# Patient Record
Sex: Female | Born: 1950
Health system: Southern US, Community
[De-identification: ages and names within clinical notes are randomized; demographics above are authoritative.]

## PROBLEM LIST (undated history)

## (undated) DIAGNOSIS — F32A Depression, unspecified: Secondary | ICD-10-CM

## (undated) DIAGNOSIS — K219 Gastro-esophageal reflux disease without esophagitis: Secondary | ICD-10-CM

## (undated) DIAGNOSIS — F17200 Nicotine dependence, unspecified, uncomplicated: Secondary | ICD-10-CM

## (undated) DIAGNOSIS — S0502XA Injury of conjunctiva and corneal abrasion without foreign body, left eye, initial encounter: Secondary | ICD-10-CM

## (undated) DIAGNOSIS — T7840XA Allergy, unspecified, initial encounter: Secondary | ICD-10-CM

## (undated) DIAGNOSIS — Z8669 Personal history of other diseases of the nervous system and sense organs: Secondary | ICD-10-CM

## (undated) DIAGNOSIS — E785 Hyperlipidemia, unspecified: Secondary | ICD-10-CM

## (undated) DIAGNOSIS — M549 Dorsalgia, unspecified: Secondary | ICD-10-CM

## (undated) DIAGNOSIS — J449 Chronic obstructive pulmonary disease, unspecified: Secondary | ICD-10-CM

## (undated) DIAGNOSIS — I10 Essential (primary) hypertension: Secondary | ICD-10-CM

## (undated) DIAGNOSIS — D126 Benign neoplasm of colon, unspecified: Secondary | ICD-10-CM

## (undated) DIAGNOSIS — H409 Unspecified glaucoma: Secondary | ICD-10-CM

## (undated) DIAGNOSIS — F419 Anxiety disorder, unspecified: Secondary | ICD-10-CM

## (undated) DIAGNOSIS — F410 Panic disorder [episodic paroxysmal anxiety] without agoraphobia: Secondary | ICD-10-CM

## (undated) DIAGNOSIS — E119 Type 2 diabetes mellitus without complications: Secondary | ICD-10-CM

## (undated) DIAGNOSIS — J45909 Unspecified asthma, uncomplicated: Secondary | ICD-10-CM

## (undated) DIAGNOSIS — R109 Unspecified abdominal pain: Secondary | ICD-10-CM

## (undated) DIAGNOSIS — H269 Unspecified cataract: Secondary | ICD-10-CM

## (undated) DIAGNOSIS — F329 Major depressive disorder, single episode, unspecified: Secondary | ICD-10-CM

## (undated) HISTORY — DX: Type 2 diabetes mellitus without complications: E11.9

## (undated) HISTORY — DX: Chronic obstructive pulmonary disease, unspecified: J44.9

## (undated) HISTORY — DX: Injury of conjunctiva and corneal abrasion without foreign body, left eye, initial encounter: S05.02XA

## (undated) HISTORY — PX: COLONOSCOPY: SHX174

## (undated) HISTORY — DX: Unspecified asthma, uncomplicated: J45.909

## (undated) HISTORY — DX: Allergy, unspecified, initial encounter: T78.40XA

## (undated) HISTORY — DX: Gastro-esophageal reflux disease without esophagitis: K21.9

## (undated) HISTORY — DX: Major depressive disorder, single episode, unspecified: F32.9

## (undated) HISTORY — DX: Anxiety disorder, unspecified: F41.9

## (undated) HISTORY — PX: TUBAL LIGATION: SHX77

## (undated) HISTORY — PX: ABDOMINAL HYSTERECTOMY: SHX81

## (undated) HISTORY — PX: POLYPECTOMY: SHX149

## (undated) HISTORY — DX: Hyperlipidemia, unspecified: E78.5

## (undated) HISTORY — DX: Dorsalgia, unspecified: M54.9

## (undated) HISTORY — DX: Personal history of other diseases of the nervous system and sense organs: Z86.69

## (undated) HISTORY — DX: Benign neoplasm of colon, unspecified: D12.6

## (undated) HISTORY — DX: Nicotine dependence, unspecified, uncomplicated: F17.200

## (undated) HISTORY — DX: Unspecified cataract: H26.9

## (undated) HISTORY — DX: Essential (primary) hypertension: I10

## (undated) HISTORY — DX: Panic disorder (episodic paroxysmal anxiety): F41.0

## (undated) HISTORY — DX: Unspecified abdominal pain: R10.9

## (undated) HISTORY — DX: Depression, unspecified: F32.A

## (undated) HISTORY — DX: Unspecified glaucoma: H40.9

---
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
## 1980-10-27 HISTORY — PX: OTHER SURGICAL HISTORY: SHX169

## 1987-10-28 HISTORY — PX: TOTAL ABDOMINAL HYSTERECTOMY W/ BILATERAL SALPINGOOPHORECTOMY: SHX83

## 1990-10-27 HISTORY — PX: OTHER SURGICAL HISTORY: SHX169

## 1997-10-27 HISTORY — PX: CHOLECYSTECTOMY: SHX55

## 2001-02-06 ENCOUNTER — Emergency Department (HOSPITAL_COMMUNITY): Admission: EM | Admit: 2001-02-06 | Discharge: 2001-02-06 | Payer: Self-pay | Admitting: Internal Medicine

## 2001-02-06 ENCOUNTER — Encounter: Payer: Self-pay | Admitting: Internal Medicine

## 2001-03-29 ENCOUNTER — Emergency Department (HOSPITAL_COMMUNITY): Admission: EM | Admit: 2001-03-29 | Discharge: 2001-03-29 | Payer: Self-pay | Admitting: Emergency Medicine

## 2002-03-23 ENCOUNTER — Emergency Department (HOSPITAL_COMMUNITY): Admission: EM | Admit: 2002-03-23 | Discharge: 2002-03-23 | Payer: Self-pay | Admitting: Emergency Medicine

## 2002-04-18 ENCOUNTER — Emergency Department (HOSPITAL_COMMUNITY): Admission: EM | Admit: 2002-04-18 | Discharge: 2002-04-18 | Payer: Self-pay | Admitting: *Deleted

## 2002-12-19 ENCOUNTER — Emergency Department (HOSPITAL_COMMUNITY): Admission: EM | Admit: 2002-12-19 | Discharge: 2002-12-19 | Payer: Self-pay | Admitting: Emergency Medicine

## 2002-12-28 ENCOUNTER — Encounter: Payer: Self-pay | Admitting: Internal Medicine

## 2002-12-28 ENCOUNTER — Ambulatory Visit (HOSPITAL_COMMUNITY): Admission: RE | Admit: 2002-12-28 | Discharge: 2002-12-28 | Payer: Self-pay | Admitting: Internal Medicine

## 2003-03-12 ENCOUNTER — Emergency Department (HOSPITAL_COMMUNITY): Admission: EM | Admit: 2003-03-12 | Discharge: 2003-03-12 | Payer: Self-pay | Admitting: Internal Medicine

## 2003-07-24 ENCOUNTER — Encounter: Payer: Self-pay | Admitting: Emergency Medicine

## 2003-07-24 ENCOUNTER — Emergency Department (HOSPITAL_COMMUNITY): Admission: EM | Admit: 2003-07-24 | Discharge: 2003-07-24 | Payer: Self-pay | Admitting: Emergency Medicine

## 2004-06-06 ENCOUNTER — Emergency Department (HOSPITAL_COMMUNITY): Admission: EM | Admit: 2004-06-06 | Discharge: 2004-06-06 | Payer: Self-pay | Admitting: Emergency Medicine

## 2004-06-18 ENCOUNTER — Emergency Department (HOSPITAL_COMMUNITY): Admission: EM | Admit: 2004-06-18 | Discharge: 2004-06-18 | Payer: Self-pay | Admitting: Emergency Medicine

## 2004-09-18 ENCOUNTER — Ambulatory Visit (HOSPITAL_COMMUNITY): Admission: RE | Admit: 2004-09-18 | Discharge: 2004-09-18 | Payer: Self-pay | Admitting: Family Medicine

## 2004-09-22 ENCOUNTER — Emergency Department (HOSPITAL_COMMUNITY): Admission: EM | Admit: 2004-09-22 | Discharge: 2004-09-22 | Payer: Self-pay | Admitting: Emergency Medicine

## 2004-10-30 ENCOUNTER — Ambulatory Visit: Payer: Self-pay | Admitting: Family Medicine

## 2004-12-19 ENCOUNTER — Ambulatory Visit: Payer: Self-pay | Admitting: Family Medicine

## 2005-01-22 ENCOUNTER — Ambulatory Visit: Payer: Self-pay | Admitting: Family Medicine

## 2005-01-23 ENCOUNTER — Emergency Department (HOSPITAL_COMMUNITY): Admission: EM | Admit: 2005-01-23 | Discharge: 2005-01-23 | Payer: Self-pay | Admitting: Emergency Medicine

## 2005-02-13 ENCOUNTER — Ambulatory Visit: Payer: Self-pay | Admitting: Orthopedic Surgery

## 2005-04-09 ENCOUNTER — Ambulatory Visit: Payer: Self-pay | Admitting: Orthopedic Surgery

## 2005-05-07 ENCOUNTER — Ambulatory Visit (HOSPITAL_COMMUNITY): Admission: RE | Admit: 2005-05-07 | Discharge: 2005-05-07 | Payer: Self-pay | Admitting: General Surgery

## 2005-07-14 ENCOUNTER — Emergency Department (HOSPITAL_COMMUNITY): Admission: EM | Admit: 2005-07-14 | Discharge: 2005-07-14 | Payer: Self-pay | Admitting: Emergency Medicine

## 2005-07-15 ENCOUNTER — Ambulatory Visit: Payer: Self-pay | Admitting: Family Medicine

## 2005-09-16 ENCOUNTER — Ambulatory Visit: Payer: Self-pay | Admitting: Family Medicine

## 2005-10-22 ENCOUNTER — Ambulatory Visit: Payer: Self-pay | Admitting: Internal Medicine

## 2005-11-12 ENCOUNTER — Ambulatory Visit: Payer: Self-pay | Admitting: Family Medicine

## 2005-12-03 ENCOUNTER — Ambulatory Visit (HOSPITAL_COMMUNITY): Admission: RE | Admit: 2005-12-03 | Discharge: 2005-12-03 | Payer: Self-pay | Admitting: Internal Medicine

## 2005-12-03 ENCOUNTER — Ambulatory Visit: Payer: Self-pay | Admitting: Internal Medicine

## 2005-12-03 LAB — HM COLONOSCOPY: HM Colonoscopy: ABNORMAL

## 2005-12-04 ENCOUNTER — Encounter (INDEPENDENT_AMBULATORY_CARE_PROVIDER_SITE_OTHER): Payer: Self-pay | Admitting: Internal Medicine

## 2006-01-01 ENCOUNTER — Ambulatory Visit: Payer: Self-pay | Admitting: Family Medicine

## 2006-02-05 ENCOUNTER — Ambulatory Visit: Payer: Self-pay | Admitting: Internal Medicine

## 2006-03-18 ENCOUNTER — Inpatient Hospital Stay (HOSPITAL_COMMUNITY): Admission: EM | Admit: 2006-03-18 | Discharge: 2006-03-20 | Payer: Self-pay | Admitting: Emergency Medicine

## 2006-03-25 ENCOUNTER — Ambulatory Visit: Payer: Self-pay | Admitting: Family Medicine

## 2006-04-02 ENCOUNTER — Ambulatory Visit (HOSPITAL_COMMUNITY): Payer: Self-pay | Admitting: Psychiatry

## 2006-04-09 ENCOUNTER — Ambulatory Visit: Payer: Self-pay | Admitting: Family Medicine

## 2006-04-20 ENCOUNTER — Ambulatory Visit (HOSPITAL_COMMUNITY): Payer: Self-pay | Admitting: Psychiatry

## 2006-04-30 ENCOUNTER — Ambulatory Visit (HOSPITAL_COMMUNITY): Payer: Self-pay | Admitting: Psychiatry

## 2006-06-18 ENCOUNTER — Ambulatory Visit (HOSPITAL_COMMUNITY): Payer: Self-pay | Admitting: Psychiatry

## 2006-07-07 ENCOUNTER — Emergency Department (HOSPITAL_COMMUNITY): Admission: EM | Admit: 2006-07-07 | Discharge: 2006-07-07 | Payer: Self-pay | Admitting: Emergency Medicine

## 2006-07-15 ENCOUNTER — Ambulatory Visit: Payer: Self-pay | Admitting: Family Medicine

## 2006-07-16 ENCOUNTER — Ambulatory Visit (HOSPITAL_COMMUNITY): Payer: Self-pay | Admitting: Psychiatry

## 2006-07-28 ENCOUNTER — Ambulatory Visit (HOSPITAL_COMMUNITY): Payer: Self-pay | Admitting: Psychiatry

## 2006-09-02 ENCOUNTER — Ambulatory Visit: Payer: Self-pay | Admitting: Gastroenterology

## 2006-09-08 ENCOUNTER — Ambulatory Visit (HOSPITAL_COMMUNITY): Payer: Self-pay | Admitting: Psychiatry

## 2006-09-30 ENCOUNTER — Ambulatory Visit: Payer: Self-pay | Admitting: Family Medicine

## 2006-10-27 HISTORY — PX: OTHER SURGICAL HISTORY: SHX169

## 2006-10-29 ENCOUNTER — Ambulatory Visit: Payer: Self-pay | Admitting: Family Medicine

## 2006-12-08 ENCOUNTER — Ambulatory Visit (HOSPITAL_COMMUNITY): Payer: Self-pay | Admitting: Psychiatry

## 2006-12-09 ENCOUNTER — Ambulatory Visit (HOSPITAL_COMMUNITY): Payer: Self-pay | Admitting: Psychiatry

## 2006-12-22 ENCOUNTER — Ambulatory Visit (HOSPITAL_COMMUNITY): Admission: RE | Admit: 2006-12-22 | Discharge: 2006-12-22 | Payer: Self-pay | Admitting: Radiation Oncology

## 2007-02-04 ENCOUNTER — Ambulatory Visit (HOSPITAL_COMMUNITY): Payer: Self-pay | Admitting: Psychiatry

## 2007-04-01 ENCOUNTER — Ambulatory Visit: Payer: Self-pay | Admitting: Internal Medicine

## 2007-04-13 ENCOUNTER — Ambulatory Visit (HOSPITAL_COMMUNITY): Payer: Self-pay | Admitting: Psychiatry

## 2007-04-21 ENCOUNTER — Ambulatory Visit (HOSPITAL_COMMUNITY): Payer: Self-pay | Admitting: Psychiatry

## 2007-04-21 ENCOUNTER — Ambulatory Visit (HOSPITAL_COMMUNITY): Admission: RE | Admit: 2007-04-21 | Discharge: 2007-04-21 | Payer: Self-pay | Admitting: Internal Medicine

## 2007-05-06 ENCOUNTER — Ambulatory Visit (HOSPITAL_COMMUNITY): Payer: Self-pay | Admitting: Psychiatry

## 2007-05-12 ENCOUNTER — Ambulatory Visit (HOSPITAL_COMMUNITY): Payer: Self-pay | Admitting: Psychiatry

## 2007-06-01 ENCOUNTER — Ambulatory Visit (HOSPITAL_BASED_OUTPATIENT_CLINIC_OR_DEPARTMENT_OTHER): Admission: RE | Admit: 2007-06-01 | Discharge: 2007-06-02 | Payer: Self-pay | Admitting: Orthopedic Surgery

## 2007-07-15 ENCOUNTER — Ambulatory Visit (HOSPITAL_COMMUNITY): Payer: Self-pay | Admitting: Psychiatry

## 2007-09-06 ENCOUNTER — Ambulatory Visit (HOSPITAL_COMMUNITY): Admission: RE | Admit: 2007-09-06 | Discharge: 2007-09-06 | Payer: Self-pay | Admitting: Family Medicine

## 2007-09-16 ENCOUNTER — Ambulatory Visit (HOSPITAL_COMMUNITY): Payer: Self-pay | Admitting: Psychiatry

## 2007-10-12 ENCOUNTER — Ambulatory Visit (HOSPITAL_COMMUNITY): Payer: Self-pay | Admitting: Psychiatry

## 2007-11-05 ENCOUNTER — Encounter: Payer: Self-pay | Admitting: Family Medicine

## 2007-11-05 DIAGNOSIS — E1159 Type 2 diabetes mellitus with other circulatory complications: Secondary | ICD-10-CM | POA: Insufficient documentation

## 2007-11-05 DIAGNOSIS — F410 Panic disorder [episodic paroxysmal anxiety] without agoraphobia: Secondary | ICD-10-CM

## 2007-11-05 DIAGNOSIS — I10 Essential (primary) hypertension: Secondary | ICD-10-CM | POA: Insufficient documentation

## 2007-11-05 DIAGNOSIS — K219 Gastro-esophageal reflux disease without esophagitis: Secondary | ICD-10-CM | POA: Insufficient documentation

## 2007-11-05 DIAGNOSIS — I152 Hypertension secondary to endocrine disorders: Secondary | ICD-10-CM | POA: Insufficient documentation

## 2007-11-05 DIAGNOSIS — F329 Major depressive disorder, single episode, unspecified: Secondary | ICD-10-CM | POA: Insufficient documentation

## 2007-11-05 DIAGNOSIS — Z87898 Personal history of other specified conditions: Secondary | ICD-10-CM | POA: Insufficient documentation

## 2007-11-05 HISTORY — DX: Panic disorder (episodic paroxysmal anxiety): F41.0

## 2008-02-01 ENCOUNTER — Ambulatory Visit (HOSPITAL_COMMUNITY): Payer: Self-pay | Admitting: Psychiatry

## 2008-05-02 ENCOUNTER — Ambulatory Visit (HOSPITAL_COMMUNITY): Payer: Self-pay | Admitting: Psychiatry

## 2008-08-17 ENCOUNTER — Ambulatory Visit: Payer: Self-pay | Admitting: Family Medicine

## 2008-08-17 DIAGNOSIS — R5381 Other malaise: Secondary | ICD-10-CM | POA: Insufficient documentation

## 2008-08-17 DIAGNOSIS — R5383 Other fatigue: Secondary | ICD-10-CM

## 2008-08-17 DIAGNOSIS — IMO0002 Reserved for concepts with insufficient information to code with codable children: Secondary | ICD-10-CM | POA: Insufficient documentation

## 2008-08-18 ENCOUNTER — Encounter: Payer: Self-pay | Admitting: Family Medicine

## 2008-08-18 DIAGNOSIS — Z87891 Personal history of nicotine dependence: Secondary | ICD-10-CM | POA: Insufficient documentation

## 2008-08-18 DIAGNOSIS — N39498 Other specified urinary incontinence: Secondary | ICD-10-CM | POA: Insufficient documentation

## 2008-08-18 DIAGNOSIS — F17200 Nicotine dependence, unspecified, uncomplicated: Secondary | ICD-10-CM

## 2008-08-18 HISTORY — DX: Personal history of nicotine dependence: Z87.891

## 2008-08-21 ENCOUNTER — Ambulatory Visit: Payer: Self-pay | Admitting: Family Medicine

## 2008-08-21 ENCOUNTER — Telehealth: Payer: Self-pay | Admitting: Family Medicine

## 2008-08-24 ENCOUNTER — Ambulatory Visit (HOSPITAL_COMMUNITY): Admission: RE | Admit: 2008-08-24 | Discharge: 2008-08-24 | Payer: Self-pay | Admitting: Family Medicine

## 2008-08-24 ENCOUNTER — Ambulatory Visit (HOSPITAL_COMMUNITY): Payer: Self-pay | Admitting: Psychiatry

## 2008-09-06 ENCOUNTER — Ambulatory Visit (HOSPITAL_COMMUNITY): Admission: RE | Admit: 2008-09-06 | Discharge: 2008-09-06 | Payer: Self-pay | Admitting: Family Medicine

## 2008-09-13 ENCOUNTER — Ambulatory Visit (HOSPITAL_COMMUNITY): Payer: Self-pay | Admitting: Psychiatry

## 2008-09-25 ENCOUNTER — Encounter: Payer: Self-pay | Admitting: Family Medicine

## 2008-09-26 LAB — CONVERTED CEMR LAB
ALT: 33 units/L (ref 0–35)
AST: 27 units/L (ref 0–37)
Albumin: 4.6 g/dL (ref 3.5–5.2)
Alkaline Phosphatase: 86 units/L (ref 39–117)
BUN: 18 mg/dL (ref 6–23)
Basophils Absolute: 0 10*3/uL (ref 0.0–0.1)
Basophils Relative: 1 % (ref 0–1)
Bilirubin, Direct: 0.1 mg/dL (ref 0.0–0.3)
CO2: 25 meq/L (ref 19–32)
Calcium: 9.3 mg/dL (ref 8.4–10.5)
Chloride: 102 meq/L (ref 96–112)
Cholesterol: 236 mg/dL — ABNORMAL HIGH (ref 0–200)
Creatinine, Ser: 0.94 mg/dL (ref 0.40–1.20)
Eosinophils Absolute: 0.3 10*3/uL (ref 0.0–0.7)
Eosinophils Relative: 4 % (ref 0–5)
Glucose, Bld: 74 mg/dL (ref 70–99)
HCT: 43.7 % (ref 36.0–46.0)
HDL: 50 mg/dL (ref >39)
Hemoglobin: 14.2 g/dL (ref 12.0–15.0)
Indirect Bilirubin: 0.3 mg/dL (ref 0.0–0.9)
LDL Cholesterol: 129 mg/dL — ABNORMAL HIGH (ref 0–99)
Lymphocytes Relative: 51 % — ABNORMAL HIGH (ref 12–46)
Lymphs Abs: 3.3 10*3/uL (ref 0.7–4.0)
MCHC: 32.5 g/dL (ref 30.0–36.0)
MCV: 86.4 fL (ref 78.0–100.0)
Monocytes Absolute: 0.5 10*3/uL (ref 0.1–1.0)
Monocytes Relative: 7 % (ref 3–12)
Neutro Abs: 2.4 10*3/uL (ref 1.7–7.7)
Neutrophils Relative %: 37 % — ABNORMAL LOW (ref 43–77)
Platelets: 391 10*3/uL (ref 150–400)
Potassium: 3.4 meq/L — ABNORMAL LOW (ref 3.5–5.3)
RBC: 5.06 M/uL (ref 3.87–5.11)
RDW: 14.2 % (ref 11.5–15.5)
Sodium: 142 meq/L (ref 135–145)
TSH: 1.194 microintl units/mL (ref 0.350–4.50)
Total Bilirubin: 0.4 mg/dL (ref 0.3–1.2)
Total CHOL/HDL Ratio: 4.7
Total Protein: 7.4 g/dL (ref 6.0–8.3)
Triglycerides: 286 mg/dL — ABNORMAL HIGH (ref <150)
VLDL: 57 mg/dL — ABNORMAL HIGH (ref 0–40)
WBC: 6.5 10*3/uL (ref 4.0–10.5)

## 2008-09-28 ENCOUNTER — Ambulatory Visit: Payer: Self-pay | Admitting: Family Medicine

## 2008-10-03 ENCOUNTER — Ambulatory Visit: Payer: Self-pay | Admitting: Family Medicine

## 2008-11-09 ENCOUNTER — Ambulatory Visit: Payer: Self-pay | Admitting: Family Medicine

## 2008-11-09 DIAGNOSIS — R110 Nausea: Secondary | ICD-10-CM | POA: Insufficient documentation

## 2008-11-16 ENCOUNTER — Telehealth: Payer: Self-pay | Admitting: Family Medicine

## 2008-11-16 ENCOUNTER — Ambulatory Visit: Payer: Self-pay | Admitting: Family Medicine

## 2008-11-23 ENCOUNTER — Ambulatory Visit (HOSPITAL_COMMUNITY): Payer: Self-pay | Admitting: Psychiatry

## 2008-12-28 ENCOUNTER — Ambulatory Visit: Payer: Self-pay | Admitting: Family Medicine

## 2009-01-01 ENCOUNTER — Telehealth: Payer: Self-pay | Admitting: Family Medicine

## 2009-01-19 ENCOUNTER — Encounter: Payer: Self-pay | Admitting: Family Medicine

## 2009-02-15 ENCOUNTER — Encounter: Payer: Self-pay | Admitting: Family Medicine

## 2009-02-15 ENCOUNTER — Telehealth: Payer: Self-pay | Admitting: Family Medicine

## 2009-02-19 ENCOUNTER — Telehealth: Payer: Self-pay | Admitting: Family Medicine

## 2009-02-20 ENCOUNTER — Ambulatory Visit: Payer: Self-pay | Admitting: Family Medicine

## 2009-02-20 LAB — CONVERTED CEMR LAB
ALT: 11 units/L (ref 0–35)
AST: 16 units/L (ref 0–37)
Albumin: 4.2 g/dL (ref 3.5–5.2)
Alkaline Phosphatase: 107 units/L (ref 39–117)
Bilirubin, Direct: 0.1 mg/dL (ref 0.0–0.3)
Cholesterol: 169 mg/dL (ref 0–200)
HDL: 36 mg/dL — ABNORMAL LOW (ref >39)
Indirect Bilirubin: 0.5 mg/dL (ref 0.0–0.9)
LDL Cholesterol: 103 mg/dL — ABNORMAL HIGH (ref 0–99)
Total Bilirubin: 0.6 mg/dL (ref 0.3–1.2)
Total CHOL/HDL Ratio: 4.7
Total Protein: 7 g/dL (ref 6.0–8.3)
Triglycerides: 151 mg/dL — ABNORMAL HIGH (ref <150)
VLDL: 30 mg/dL (ref 0–40)

## 2009-02-23 ENCOUNTER — Encounter: Admission: RE | Admit: 2009-02-23 | Discharge: 2009-02-23 | Payer: Self-pay | Admitting: Family Medicine

## 2009-02-26 ENCOUNTER — Telehealth: Payer: Self-pay | Admitting: Family Medicine

## 2009-02-28 ENCOUNTER — Telehealth: Payer: Self-pay | Admitting: Family Medicine

## 2009-03-01 ENCOUNTER — Encounter: Payer: Self-pay | Admitting: Family Medicine

## 2009-03-05 ENCOUNTER — Encounter: Payer: Self-pay | Admitting: Family Medicine

## 2009-03-07 ENCOUNTER — Encounter: Payer: Self-pay | Admitting: Family Medicine

## 2009-03-08 ENCOUNTER — Ambulatory Visit (HOSPITAL_COMMUNITY): Payer: Self-pay | Admitting: Psychiatry

## 2009-03-22 ENCOUNTER — Encounter (INDEPENDENT_AMBULATORY_CARE_PROVIDER_SITE_OTHER): Payer: Self-pay

## 2009-03-22 ENCOUNTER — Ambulatory Visit: Payer: Self-pay | Admitting: Family Medicine

## 2009-03-23 ENCOUNTER — Ambulatory Visit (HOSPITAL_COMMUNITY): Payer: Self-pay | Admitting: Psychiatry

## 2009-04-27 ENCOUNTER — Encounter: Payer: Self-pay | Admitting: Family Medicine

## 2009-05-04 ENCOUNTER — Telehealth: Payer: Self-pay | Admitting: Family Medicine

## 2009-05-07 ENCOUNTER — Emergency Department (HOSPITAL_COMMUNITY): Admission: EM | Admit: 2009-05-07 | Discharge: 2009-05-07 | Payer: Self-pay | Admitting: Emergency Medicine

## 2009-05-08 ENCOUNTER — Encounter: Payer: Self-pay | Admitting: Family Medicine

## 2009-05-16 ENCOUNTER — Encounter: Payer: Self-pay | Admitting: Family Medicine

## 2009-06-18 ENCOUNTER — Encounter: Payer: Self-pay | Admitting: Family Medicine

## 2009-07-12 ENCOUNTER — Encounter: Payer: Self-pay | Admitting: Family Medicine

## 2009-08-02 ENCOUNTER — Ambulatory Visit (HOSPITAL_COMMUNITY): Payer: Self-pay | Admitting: Psychiatry

## 2009-08-03 ENCOUNTER — Ambulatory Visit (HOSPITAL_COMMUNITY): Payer: Self-pay | Admitting: Psychiatry

## 2009-08-06 ENCOUNTER — Encounter: Payer: Self-pay | Admitting: Family Medicine

## 2009-08-08 ENCOUNTER — Ambulatory Visit: Payer: Self-pay | Admitting: Family Medicine

## 2009-08-09 ENCOUNTER — Ambulatory Visit (HOSPITAL_COMMUNITY): Payer: Self-pay | Admitting: Psychiatry

## 2009-08-09 ENCOUNTER — Telehealth: Payer: Self-pay | Admitting: Family Medicine

## 2009-08-16 ENCOUNTER — Ambulatory Visit (HOSPITAL_COMMUNITY): Payer: Self-pay | Admitting: Psychiatry

## 2009-08-20 ENCOUNTER — Encounter: Payer: Self-pay | Admitting: Family Medicine

## 2009-08-29 ENCOUNTER — Encounter: Payer: Self-pay | Admitting: Family Medicine

## 2009-10-03 ENCOUNTER — Encounter: Payer: Self-pay | Admitting: Family Medicine

## 2009-10-17 ENCOUNTER — Ambulatory Visit: Payer: Self-pay | Admitting: Family Medicine

## 2009-10-17 DIAGNOSIS — N3000 Acute cystitis without hematuria: Secondary | ICD-10-CM | POA: Insufficient documentation

## 2009-10-17 LAB — CONVERTED CEMR LAB
Blood in Urine, dipstick: NEGATIVE
Glucose, Urine, Semiquant: NEGATIVE
Nitrite: NEGATIVE
Protein, U semiquant: 30
Specific Gravity, Urine: 1.025
Urobilinogen, UA: 1
WBC Urine, dipstick: NEGATIVE
pH: 5.5

## 2009-10-18 ENCOUNTER — Telehealth: Payer: Self-pay | Admitting: Family Medicine

## 2009-10-29 ENCOUNTER — Encounter: Payer: Self-pay | Admitting: Family Medicine

## 2009-10-30 ENCOUNTER — Encounter: Payer: Self-pay | Admitting: Family Medicine

## 2009-11-05 ENCOUNTER — Ambulatory Visit: Payer: Self-pay | Admitting: Family Medicine

## 2009-11-05 DIAGNOSIS — R7301 Impaired fasting glucose: Secondary | ICD-10-CM | POA: Insufficient documentation

## 2009-11-05 DIAGNOSIS — B356 Tinea cruris: Secondary | ICD-10-CM | POA: Insufficient documentation

## 2009-11-05 LAB — CONVERTED CEMR LAB
Bilirubin Urine: NEGATIVE
Blood Glucose, Fasting: 122 mg/dL
Blood in Urine, dipstick: NEGATIVE
Glucose, Urine, Semiquant: NEGATIVE
Hgb A1c MFr Bld: 5.6 %
Ketones, urine, test strip: NEGATIVE
Nitrite: POSITIVE
Protein, U semiquant: NEGATIVE
Specific Gravity, Urine: 1.01
Urobilinogen, UA: 0.2
WBC Urine, dipstick: NEGATIVE
pH: 6

## 2009-11-06 ENCOUNTER — Encounter: Payer: Self-pay | Admitting: Family Medicine

## 2009-11-08 ENCOUNTER — Encounter: Payer: Self-pay | Admitting: Family Medicine

## 2009-11-20 ENCOUNTER — Telehealth: Payer: Self-pay | Admitting: Family Medicine

## 2009-11-20 ENCOUNTER — Encounter: Payer: Self-pay | Admitting: Family Medicine

## 2009-12-17 ENCOUNTER — Encounter: Payer: Self-pay | Admitting: Family Medicine

## 2009-12-17 ENCOUNTER — Ambulatory Visit (HOSPITAL_COMMUNITY): Payer: Self-pay | Admitting: Psychiatry

## 2009-12-18 ENCOUNTER — Telehealth (INDEPENDENT_AMBULATORY_CARE_PROVIDER_SITE_OTHER): Payer: Self-pay | Admitting: *Deleted

## 2009-12-19 ENCOUNTER — Telehealth: Payer: Self-pay | Admitting: Family Medicine

## 2009-12-19 ENCOUNTER — Encounter: Payer: Self-pay | Admitting: Family Medicine

## 2009-12-21 ENCOUNTER — Ambulatory Visit: Payer: Self-pay | Admitting: Family Medicine

## 2010-01-03 ENCOUNTER — Ambulatory Visit (HOSPITAL_COMMUNITY): Payer: Self-pay | Admitting: Psychiatry

## 2010-02-11 ENCOUNTER — Ambulatory Visit: Payer: Self-pay | Admitting: Family Medicine

## 2010-02-12 ENCOUNTER — Telehealth: Payer: Self-pay | Admitting: Family Medicine

## 2010-02-27 ENCOUNTER — Encounter: Payer: Self-pay | Admitting: Family Medicine

## 2010-04-01 ENCOUNTER — Ambulatory Visit (HOSPITAL_COMMUNITY): Payer: Self-pay | Admitting: Psychiatry

## 2010-04-02 ENCOUNTER — Encounter: Payer: Self-pay | Admitting: Family Medicine

## 2010-04-15 ENCOUNTER — Ambulatory Visit (HOSPITAL_COMMUNITY): Payer: Self-pay | Admitting: Psychiatry

## 2010-04-18 ENCOUNTER — Ambulatory Visit (HOSPITAL_COMMUNITY): Payer: Self-pay | Admitting: Psychiatry

## 2010-04-26 ENCOUNTER — Encounter: Payer: Self-pay | Admitting: Family Medicine

## 2010-05-22 ENCOUNTER — Ambulatory Visit (HOSPITAL_COMMUNITY): Payer: Self-pay | Admitting: Psychiatry

## 2010-06-02 ENCOUNTER — Inpatient Hospital Stay (HOSPITAL_COMMUNITY): Admission: EM | Admit: 2010-06-02 | Discharge: 2010-06-07 | Payer: Self-pay | Admitting: Emergency Medicine

## 2010-06-04 ENCOUNTER — Encounter (INDEPENDENT_AMBULATORY_CARE_PROVIDER_SITE_OTHER): Payer: Self-pay

## 2010-06-04 HISTORY — PX: SIMPLE MASTECTOMY: SHX2409

## 2010-06-18 ENCOUNTER — Ambulatory Visit: Payer: Self-pay | Admitting: Family Medicine

## 2010-06-28 ENCOUNTER — Ambulatory Visit (HOSPITAL_COMMUNITY): Payer: Self-pay | Admitting: Psychiatry

## 2010-07-11 ENCOUNTER — Ambulatory Visit (HOSPITAL_COMMUNITY): Admission: RE | Admit: 2010-07-11 | Discharge: 2010-07-11 | Payer: Self-pay | Admitting: Family Medicine

## 2010-07-11 ENCOUNTER — Ambulatory Visit: Payer: Self-pay | Admitting: Family Medicine

## 2010-07-11 ENCOUNTER — Ambulatory Visit (HOSPITAL_COMMUNITY): Payer: Self-pay | Admitting: Psychiatry

## 2010-07-15 ENCOUNTER — Encounter: Payer: Self-pay | Admitting: Family Medicine

## 2010-07-18 LAB — CONVERTED CEMR LAB
ALT: 18 units/L (ref 0–35)
BUN: 8 mg/dL (ref 6–23)
Bilirubin, Direct: 0.1 mg/dL (ref 0.0–0.3)
CO2: 22 meq/L (ref 19–32)
Cholesterol: 225 mg/dL — ABNORMAL HIGH (ref 0–200)
Eosinophils Absolute: 0.2 10*3/uL (ref 0.0–0.7)
Eosinophils Relative: 3 % (ref 0–5)
Glucose, Bld: 98 mg/dL (ref 70–99)
HCT: 43.3 % (ref 36.0–46.0)
Indirect Bilirubin: 0.5 mg/dL (ref 0.0–0.9)
Lymphocytes Relative: 57 % — ABNORMAL HIGH (ref 12–46)
Lymphs Abs: 3.2 10*3/uL (ref 0.7–4.0)
MCV: 88.7 fL (ref 78.0–100.0)
Monocytes Relative: 6 % (ref 3–12)
Potassium: 4.4 meq/L (ref 3.5–5.3)
RBC: 4.88 M/uL (ref 3.87–5.11)
Total Bilirubin: 0.6 mg/dL (ref 0.3–1.2)
VLDL: 53 mg/dL — ABNORMAL HIGH (ref 0–40)
WBC: 5.7 10*3/uL (ref 4.0–10.5)

## 2010-08-09 ENCOUNTER — Encounter: Payer: Self-pay | Admitting: Family Medicine

## 2010-08-13 ENCOUNTER — Ambulatory Visit: Payer: Self-pay | Admitting: Family Medicine

## 2010-08-13 DIAGNOSIS — R109 Unspecified abdominal pain: Secondary | ICD-10-CM | POA: Insufficient documentation

## 2010-08-13 DIAGNOSIS — R141 Gas pain: Secondary | ICD-10-CM | POA: Insufficient documentation

## 2010-08-13 DIAGNOSIS — R142 Eructation: Secondary | ICD-10-CM

## 2010-08-13 DIAGNOSIS — R143 Flatulence: Secondary | ICD-10-CM

## 2010-08-13 HISTORY — DX: Unspecified abdominal pain: R10.9

## 2010-08-13 LAB — CONVERTED CEMR LAB
Bilirubin Urine: NEGATIVE
Blood in Urine, dipstick: NEGATIVE
Glucose, Urine, Semiquant: NEGATIVE
Ketones, urine, test strip: NEGATIVE
Protein, U semiquant: NEGATIVE
Urobilinogen, UA: 0.2
pH: 5.5

## 2010-08-21 ENCOUNTER — Encounter: Payer: Self-pay | Admitting: Family Medicine

## 2010-08-23 ENCOUNTER — Telehealth: Payer: Self-pay | Admitting: Family Medicine

## 2010-08-23 ENCOUNTER — Ambulatory Visit (HOSPITAL_COMMUNITY)
Admission: RE | Admit: 2010-08-23 | Discharge: 2010-08-23 | Payer: Self-pay | Source: Home / Self Care | Admitting: Family Medicine

## 2010-10-08 ENCOUNTER — Ambulatory Visit (HOSPITAL_COMMUNITY): Payer: Self-pay | Admitting: Psychiatry

## 2010-10-16 ENCOUNTER — Ambulatory Visit: Payer: Self-pay | Admitting: Family Medicine

## 2010-10-22 ENCOUNTER — Ambulatory Visit: Payer: Self-pay | Admitting: Family Medicine

## 2010-10-23 ENCOUNTER — Telehealth: Payer: Self-pay | Admitting: Family Medicine

## 2010-10-23 ENCOUNTER — Ambulatory Visit (HOSPITAL_COMMUNITY): Payer: Self-pay | Admitting: Psychiatry

## 2010-10-27 DIAGNOSIS — G5603 Carpal tunnel syndrome, bilateral upper limbs: Secondary | ICD-10-CM

## 2010-10-27 DIAGNOSIS — L03119 Cellulitis of unspecified part of limb: Secondary | ICD-10-CM

## 2010-10-27 DIAGNOSIS — L02519 Cutaneous abscess of unspecified hand: Secondary | ICD-10-CM | POA: Insufficient documentation

## 2010-10-27 HISTORY — DX: Carpal tunnel syndrome, bilateral upper limbs: G56.03

## 2010-10-31 ENCOUNTER — Telehealth: Payer: Self-pay | Admitting: Family Medicine

## 2010-10-31 ENCOUNTER — Emergency Department (HOSPITAL_COMMUNITY)
Admission: EM | Admit: 2010-10-31 | Discharge: 2010-10-31 | Payer: Self-pay | Source: Home / Self Care | Admitting: Emergency Medicine

## 2010-11-04 ENCOUNTER — Ambulatory Visit (HOSPITAL_COMMUNITY): Admission: RE | Admit: 2010-11-04 | Payer: Self-pay | Source: Home / Self Care | Admitting: Family Medicine

## 2010-11-04 ENCOUNTER — Telehealth: Payer: Self-pay | Admitting: Family Medicine

## 2010-11-05 ENCOUNTER — Encounter: Payer: Self-pay | Admitting: Family Medicine

## 2010-11-11 LAB — GLUCOSE, CAPILLARY: Glucose-Capillary: 129 mg/dL — ABNORMAL HIGH (ref 70–99)

## 2010-11-13 ENCOUNTER — Emergency Department (HOSPITAL_COMMUNITY)
Admission: EM | Admit: 2010-11-13 | Discharge: 2010-11-13 | Payer: Self-pay | Source: Home / Self Care | Admitting: Emergency Medicine

## 2010-11-13 ENCOUNTER — Ambulatory Visit (HOSPITAL_COMMUNITY)
Admission: RE | Admit: 2010-11-13 | Discharge: 2010-11-13 | Payer: Self-pay | Source: Home / Self Care | Attending: Psychiatry | Admitting: Psychiatry

## 2010-11-13 ENCOUNTER — Encounter: Payer: Self-pay | Admitting: Family Medicine

## 2010-11-16 ENCOUNTER — Encounter: Payer: Self-pay | Admitting: Family Medicine

## 2010-11-17 ENCOUNTER — Encounter: Payer: Self-pay | Admitting: Family Medicine

## 2010-11-17 ENCOUNTER — Encounter: Payer: Self-pay | Admitting: General Surgery

## 2010-11-19 ENCOUNTER — Telehealth (INDEPENDENT_AMBULATORY_CARE_PROVIDER_SITE_OTHER): Payer: Self-pay | Admitting: *Deleted

## 2010-11-21 ENCOUNTER — Encounter: Payer: Self-pay | Admitting: Family Medicine

## 2010-11-26 NOTE — Letter (Signed)
Summary: RX ASSISTANCE  RX ASSISTANCE   Imported By: Lind Guest 08/09/2010 10:25:31  _____________________________________________________________________  External Attachment:    Type:   Image     Comment:   External Document

## 2010-11-26 NOTE — Progress Notes (Signed)
  Phone Note Call from Patient   Summary of Call: Patient called in and said that the Vicodin that you gave her is not working and she needs something else.  Initial call taken by: Everitt Amber,  November 20, 2009 1:44 PM  Follow-up for Phone Call        Patient having bilateral breast pain and she wants something other than vicodin because its not working Follow-up by: Everitt Amber,  November 20, 2009 3:31 PM  Additional Follow-up for Phone Call Additional follow up Details #1::        PLSask when she is next seeing the docs at [facility], if she is having such seveere pain in the breasts they need to know in case anything else is going on.  PLS inc the dose to twice daily, send in new script with stamped change noted to pharmacy, notify [py also, BUT above this dose I am not going,for this pain, pls let her know Additional Follow-up by: Syliva Overman MD,  November 21, 2009 5:27 AM    Additional Follow-up for Phone Call Additional follow up Details #2::    called patient, left message Follow-up by: Worthy Keeler LPN,  November 21, 2009 10:08 AM  Additional Follow-up for Phone Call Additional follow up Details #3:: Details for Additional Follow-up Action Taken: WHY IS THIS BACK IN MY BOX? Additional Follow-up by: Syliva Overman MD,  November 21, 2009 12:17 PM  New/Updated Medications: VICODIN HP 10-660 MG TABS (HYDROCODONE-ACETAMINOPHEN) Take 1 tab by mouth two times a day Prescriptions: VICODIN HP 10-660 MG TABS (HYDROCODONE-ACETAMINOPHEN) Take 1 tab by mouth two times a day  #60 x 2   Entered by:   Worthy Keeler LPN   Authorized by:   Syliva Overman MD   Signed by:   Worthy Keeler LPN on 04/54/0981   Method used:   Printed then faxed to ...       [facility]  [address] [address] [facility] (retail)       [address]       [place], [place]  [address]       Ph: [phone]       Fax: [phone]   RxID:   [id number]  med sent and patient aware

## 2010-11-26 NOTE — Letter (Signed)
Summary: RX ASSISTANCE  RX ASSISTANCE   Imported By: Lind Guest 11/08/2009 08:30:31  _____________________________________________________________________  External Attachment:    Type:   Image     Comment:   External Document

## 2010-11-26 NOTE — Miscellaneous (Signed)
Summary: refill  Clinical Lists Changes  Medications: Rx of ALPRAZOLAM 0.5 MG TABS (ALPRAZOLAM) Take 1 tablet by mouth three times a day;  #90 x 1;  Signed;  Entered by: Everitt Amber;  Authorized by: Syliva Overman MD;  Method used: Printed then faxed to [facility] [address], [place], [place], [place]  [address], Ph: [phone], Fax: [phone]    Prescriptions: ALPRAZOLAM 0.5 MG TABS (ALPRAZOLAM) Take 1 tablet by mouth three times a day  #90 x 1   Entered by:   Everitt Amber   Authorized by:   Syliva Overman MD   Signed by:   Everitt Amber on 10/29/2009   Method used:   Printed then faxed to ...       [facility] [address] [facility] (retail)       [address]       [place], [place]  [address]       Ph: [phone]       Fax: [phone]   RxID:   [id number]

## 2010-11-26 NOTE — Progress Notes (Signed)
  Phone Note Call from Patient   Caller: Patient Summary of Call: had radiology exam this morning, could not do because of anxiety, needs med so she can go back this evening  Initial call taken by: Adella Hare LPN,  August 23, 2010 11:17 AM  Follow-up for Phone Call        med prescribed pls fax andlet pt know Follow-up by: Syliva Overman MD,  August 23, 2010 11:56 AM  Additional Follow-up for Phone Call Additional follow up Details #1::        med faxed, patient aware Additional Follow-up by: Adella Hare LPN,  August 23, 2010 2:08 PM    New/Updated Medications: DIAZEPAM 5 MG TABS (DIAZEPAM) one tablet half hour before procedure , repeat after 15 mins if needed, pt needs a driver Prescriptions: DIAZEPAM 5 MG TABS (DIAZEPAM) one tablet half hour before procedure , repeat after 15 mins if needed, pt needs a driver  #2 x 0   Entered and Authorized by:   Syliva Overman MD   Signed by:   Syliva Overman MD on 08/23/2010   Method used:   Printed then faxed to ...       [facility]  [address] [facility] [address] [facility] (retail)       [address]       [place], [place]  [address]       Ph: [phone]       Fax: [phone]   RxID:   [id number]

## 2010-11-26 NOTE — Miscellaneous (Signed)
Summary: Home Care Report  Home Care Report   Imported By: Lind Guest 06/18/2010 13:43:43  _____________________________________________________________________  External Attachment:    Type:   Image     Comment:   External Document

## 2010-11-26 NOTE — Progress Notes (Signed)
[facility]: [facility]   Imported By: Lind Guest 11/27/2009 13:24:41  _____________________________________________________________________  External Attachment:    Type:   Image     Comment:   External Document

## 2010-11-26 NOTE — Letter (Signed)
Summary: rs assistance  rs assistance   Imported By: Lind Guest 08/21/2010 12:47:04  _____________________________________________________________________  External Attachment:    Type:   Image     Comment:   External Document

## 2010-11-26 NOTE — Letter (Signed)
Summary: RX PATIENT ASSISTANCE  RX PATIENT ASSISTANCE   Imported By: Lind Guest 04/02/2010 09:59:03  _____________________________________________________________________  External Attachment:    Type:   Image     Comment:   External Document

## 2010-11-26 NOTE — Assessment & Plan Note (Signed)
Summary: office visit   Vital Signs:  Patient profile:   59 year old female Menstrual status:  hysterectomy Height:      63 inches Weight:      172 pounds BMI:     30.58 O2 Sat:      97 % Pulse rate:   58 / minute Pulse rhythm:   regular Resp:     16 per minute BP sitting:   118 / 72 Cuff size:   regular  Vitals Entered By: Everitt Amber (November 05, 2009 9:06 AM)  Nutrition Counseling: Patient's BMI is greater than 25 and therefore counseled on weight management options. CC: mole on the back of neck that had been bothering her itching, she went to doc and they said it was shingles. Also still having itching and burning in vaginal area. States she has uti but te itching and burning is all the time    CC:  mole on the back of neck that had been bothering her itching and she went to doc and they said it was shingles. Also still having itching and burning in vaginal area. States she has uti but te itching and burning is all the time .  History of Present Illness: Dry mouth, blurry vision, urinary frequency progressive over the past 3 months. Vaginal itching over the past 2 months. Painful stinging around mole at back of neck x 1 week, she has been ysing neosporin topically, states she was told in the past she had shingles. She reports an 8 month h/o sore on left leg for the past 8 month which has healed and recurredf. C/O right earache intermittently for 8 months  Preventive Screening-Counseling & Management  Alcohol-Tobacco     Smoking Cessation Counseling: yes  Current Medications (verified): 1)  Wellbutrin Xl 300 Mg  Tb24 (Bupropion Hcl) .... Once Daily 2)  Proair Hfa 108 (90 Base) Mcg/act Aers (Albuterol Sulfate) .... Uad 3)  Nasonex 50 Mcg/act Susp (Mometasone Furoate) .... Two Sprays Each Nostril Qd 4)  Mirtazapine 15 Mg Tabs (Mirtazapine) .... One Tab By Mouth Qhs 5)  Dicyclomine Hcl 20 Mg Tabs (Dicyclomine Hcl) .... One Half Tabs By Mouth Three Times A Day Before  Meals 6)  Crestor 20 Mg Tabs (Rosuvastatin Calcium) .... Take 1 Tablet By Mouth Once A Day 7)  Astepro 137 Mcg/spray Soln (Azelastine Hcl) .... 2 Puffs Twice Daily 8)  Symbicort 80-4.5 Mcg/act Aero (Budesonide-Formoterol Fumarate) .... 2 Puffs Twice Daily 9)  Maxzide 75-50 Mg Tabs (Triamterene-Hctz) .... Take 1 Tablet By Mouth Once A Day 10)  Ambien Cr 12.5 Mg Cr-Tabs (Zolpidem Tartrate) .... Take 1 Tab By Mouth At Bedtime 11)  Clonidine Hcl 0.3 Mg Tabs (Clonidine Hcl) .... One and A Half Tabs At Bedtime 12)  Omeprazole 40 Mg Cpdr (Omeprazole) .... One Cap By Mouth Qd 13)  Alprazolam 0.5 Mg Tabs (Alprazolam) .... Take 1 Tablet By Mouth Three Times A Day 14)  Omeprazole 40 Mg Cpdr (Omeprazole) .... Take 1 Capsule By Mouth Once A Day 15)  Vicodin Hp 10-660 Mg Tabs (Hydrocodone-Acetaminophen) .... Take 1 Tab By Mouth At Bedtime As Needed  Allergies (verified): 1)  ! Asa 2)  ! Pcn  Family History: Mother died at 17 due staph infection, she was diabetic, had heart ds, lupus Father died at age 39, cause unknown, TB,  Sister x 1  Brother x1 dibaetic  Social History: Occupation:unemployed Current Smoker: half PPD Drug use-no Children: 3  Review of Systems  See HPI ENT:  Denies earache, hoarseness, nasal congestion, sinus pressure, and sore throat. CV:  Denies chest pain or discomfort, palpitations, and swelling of feet. Resp:  Denies cough, sputum productive, and wheezing. GI:  Denies abdominal pain, constipation, diarrhea, nausea, and vomiting. GU:  Complains of discharge, dysuria, nocturia, and urinary frequency; on avg voids 6 times between 12mn and 6 am lg amts, burns on the outside, voids every 90 mins while awake x 1 wwek. MS:  Denies joint pain and stiffness. Derm:  Complains of itching, lesion(s), and rash; 1 week h/o inguinl itching, also has non healing lesion on left thigh for several months. Reports resolution and complete healing of breast lesions . Neuro:  Denies  headaches and seizures. Psych:  Denies anxiety and depression. Endo:  Complains of excessive thirst, excessive urination, and polyuria. Heme:  Denies abnormal bruising and bleeding. Allergy:  Complains of seasonal allergies; denies hives or rash and itching eyes.  Physical Exam  General:  Well-developediverweight,in no acute distress; alert,appropriate and cooperative throughout examination HEENT: No facial asymmetry,  EOMI, No sinus tenderness, TM's Clear, oropharynx  pink and moist.   Chest: Clear to auscultation bilaterally.  CVS: S1, S2, No murmurs, No S3.   Abd: Soft, Nontender.  MS: Adequate ROM spine, hips, shoulders and knees.  Ext: No edema.   CNS: CN 2-12 intact, power tone and sensation normal throughout.   Skin:hyperpigmented raised lesion on left ant thigh, raised border, no drainage from the  area, tinea cruris, ulcers at introitus Psych: Good eye contact, normal affect.  Memory intact, not anxious or depressed appearing.    Impression & Recommendations:  Problem # 1:  IMPAIRED FASTING GLUCOSE (ICD-790.21) Assessment Comment Only  Orders: Glucose, (CBG) (82962)  122, pt had a candy Hemoglobin A1C (83036) nl reassured not currently diabetic, but based oon family hx and symptoms, need to check q 3 months. reduce carbs and sweets and lose weight.  Problem # 2:  ACUTE CYSTITIS (ICD-595.0) Assessment: Comment Only  The following medications were removed from the medication list:    Doxycycline Hyclate 100 Mg Caps (Doxycycline hyclate) .[name]... Take 1 capsule by mouth two times a day Her updated medication list for this problem includes:    Septra Ds 800-160 Mg Tabs (Sulfamethoxazole-trimethoprim) .[name]... Take 1 tablet by mouth two times a day  Orders: UA Dipstick W/ Micro (manual) (81000)abn specimen sent for c/s and antibiotioc prescribed. pt encouraged to push water intake  T-Culture, Urine (04540-98119)  Problem # 3:  OBESITY, UNSPECIFIED (ICD-278.00) Assessment:  Improved  Ht: 63 (11/05/2009)   Wt: 172 (11/05/2009)   BMI: 30.58 (11/05/2009)  Problem # 4:  NICOTINE ADDICTION (ICD-305.1) Assessment: Unchanged  The following medications were removed from the medication list:    Chantix Continuing Month Pak 1 Mg Tabs (Varenicline tartrate) .[name]... Take 1 tablet by mouth two times a day  Encouraged smoking cessation and discussed different methods for smoking cessation.   Problem # 5:  HYPERTENSION (ICD-401.9) Assessment: Unchanged  Her updated medication list for this problem includes:    Maxzide 75-50 Mg Tabs (Triamterene-hctz) .[name]... Take 1 tablet by mouth once a day    Clonidine Hcl 0.3 Mg Tabs (Clonidine hcl) ..... One and a half tabs at bedtime  BP today: 118/72 Prior BP: 120/80 (08/08/2009)  Labs Reviewed: K+: 3.4 (09/25/2008) Creat: : 0.94 (09/25/2008)   Chol: 169 (02/20/2009)   HDL: 36 (02/20/2009)   LDL: 103 (02/20/2009)   TG: 151 (02/20/2009)  Problem # 6:  DEPRESSION (  ICD-311) Assessment: Improved  Her updated medication list for this problem includes:    Wellbutrin Xl 300 Mg Tb24 (Bupropion hcl) ..... Once daily, does not have unble to afford, sending for med through assistance program    Mirtazapine 15 Mg Tabs (Mirtazapine) ..... One tab by mouth qhs    Alprazolam 0.5 Mg Tabs (Alprazolam) .[name]... Take 1 tablet by mouth three times a day  Problem # 7:  HYPERLIPIDEMIA (ICD-272.4) Assessment: Comment Only  Her updated medication list for this problem includes:    Crestor 20 Mg Tabs (Rosuvastatin calcium) .[name]... Take 1 tablet by mouth once a day  Labs Reviewed: SGOT: 16 (02/20/2009)   SGPT: 11 (02/20/2009)   HDL:36 (02/20/2009), 50 (09/25/2008)  LDL:103 (02/20/2009), 129 (09/25/2008)  Chol:169 (02/20/2009), 236 (09/25/2008)  Trig:151 (02/20/2009), 286 (09/25/2008) needs rept testing by April, low fat diet rncouraged  Problem # 8:  VULVOVAGINITIS (ICD-616.10) Assessment: Comment Only  The following medications were removed from  the medication list:    Doxycycline Hyclate 100 Mg Caps (Doxycycline hyclate) .[name]... Take 1 capsule by mouth two times a day Her updated medication list for this problem includes:    Septra Ds 800-160 Mg Tabs (Sulfamethoxazole-trimethoprim) .[name]... Take 1 tablet by mouth two times a day specimens sent fo wet prep and culture also antibody titer to HSV type 2 requested  Problem # 9:  TINEA CRURIS (ICD-110.3) Assessment: Comment Only clotrimazole script given for twice daily application  Complete Medication List: 1)  Wellbutrin Xl 300 Mg Tb24 (Bupropion hcl) .... Once daily 2)  Proair Hfa 108 (90 Base) Mcg/act Aers (Albuterol sulfate) .... Uad 3)  Nasonex 50 Mcg/act Susp (Mometasone furoate) .... Two sprays each nostril qd 4)  Mirtazapine 15 Mg Tabs (Mirtazapine) .... One tab by mouth qhs 5)  Dicyclomine Hcl 20 Mg Tabs (Dicyclomine hcl) .... One half tabs by mouth three times a day before meals 6)  Crestor 20 Mg Tabs (Rosuvastatin calcium) .... Take 1 tablet by mouth once a day 7)  Astepro 137 Mcg/spray Soln (Azelastine hcl) .... 2 puffs twice daily 8)  Symbicort 80-4.5 Mcg/act Aero (Budesonide-formoterol fumarate) .... 2 puffs twice daily 9)  Maxzide 75-50 Mg Tabs (Triamterene-hctz) .... Take 1 tablet by mouth once a day 10)  Ambien Cr 12.5 Mg Cr-tabs (Zolpidem tartrate) .... Take 1 tab by mouth at bedtime 11)  Clonidine Hcl 0.3 Mg Tabs (Clonidine hcl) .... One and a half tabs at bedtime 12)  Omeprazole 40 Mg Cpdr (Omeprazole) .... One cap by mouth qd 13)  Alprazolam 0.5 Mg Tabs (Alprazolam) .... Take 1 tablet by mouth three times a day 14)  Omeprazole 40 Mg Cpdr (Omeprazole) .... Take 1 capsule by mouth once a day 15)  Vicodin Hp 10-660 Mg Tabs (Hydrocodone-acetaminophen) .... Take 1 tab by mouth at bedtime as needed 16)  Clotrimazole 1 % Crea (Clotrimazole) .... Apply to rash on inner thighs two times a day as needed 17)  Septra Ds 800-160 Mg Tabs (Sulfamethoxazole-trimethoprim) .... Take 1  tablet by mouth two times a day 18)  Fluconazole 150 Mg Tabs (Fluconazole) .... Take 1 tablet by mouth once a day  Other Orders: HSV 2- [facility] [phone]) HSV 2- [facility] [phone]) T-Herpes Simplex Type 2 ([id number]) T-Wet Prep by Molecular Probe ([phone]) T-Chlamydia & GC Probe, Genital ([id number])  Patient Instructions: 1)  Please schedule a follow-up appointment in 3 months. 2)  Tobacco is very bad for your health and your loved ones! You Should stop smoking!. 3)  Stop Smoking Tips:  Choose a Quit date. Cut down before the Quit date. decide what you will do as a substitute when you feel the urge to smoke(gum,toothpick,exercise). 4)  It is important that you exercise regularly at least 20 minutes 5 times a week. If you develop chest pain, have severe difficulty breathing, or feel very tired , stop exercising immediately and seek medical attention. 5)  You need to lose weight. Consider a lower calorie diet and regular exercise.  6)  pLS cut down on sugars, and carbs as you could become diabetic. 7)  You are being treated for aUTI.meds are sent in , also for the rash on your inner thighs. 8)  I recommend a blood test to check for exposure to type2 herpes. 9)  pLS have [facility] at [facility] youir mole and left thigh Prescriptions: FLUCONAZOLE 150 MG TABS (FLUCONAZOLE) Take 1 tablet by mouth once a day  #3 x 0   Entered by:   Everitt Amber   Authorized by:   Syliva Overman MD   Signed by:   Syliva Overman MD on 11/05/2009   Method used:   Handwritten   RxID:   [id number] SEPTRA DS 800-160 MG TABS (SULFAMETHOXAZOLE-TRIMETHOPRIM) Take 1 tablet by mouth two times a day  #14 x 0   Entered by:   Everitt Amber   Authorized by:   Syliva Overman MD   Signed by:   Syliva Overman MD on 11/05/2009   Method used:   Handwritten   RxID:   [id number] CLOTRIMAZOLE 1 % CREA (CLOTRIMAZOLE) Apply to rash on inner thighs two times a day as needed  #45 gm x 0   Entered by:    Everitt Amber   Authorized by:   Syliva Overman MD   Signed by:   Syliva Overman MD on 11/05/2009   Method used:   Handwritten   RxID:   [id number]    Orders Added: 1)  Est. Patient Level IV [53664] 2)  HSV 2- [facility] [[id number]] 3)  UA Dipstick W/ Micro (manual) [81000] 4)  T-Culture, Urine [[id number]] 5)  HSV 2- [facility] [[id number]] 6)  T-Herpes Simplex Type 2 [86696-81071] 7)  T-Wet Prep by Molecular Probe [[id number]] 8)  T-Chlamydia & GC Probe, Genital [[id number]/[id number]] 9)  Glucose, (CBG) [82962] 10)  Hemoglobin A1C [83036]  Laboratory Results   Urine Tests  Date/Time Received: November 05, 2009  Date/Time Reported: November 05, 2009   Routine Urinalysis   Color: yellow Appearance: Clear Glucose: negative   (Normal Range: Negative) Bilirubin: negative   (Normal Range: Negative) Ketone: negative   (Normal Range: Negative) Spec. Gravity: 1.010   (Normal Range: 1.003-1.035) Blood: negative   (Normal Range: Negative) pH: 6.0   (Normal Range: 5.0-8.0) Protein: negative   (Normal Range: Negative) Urobilinogen: 0.2   (Normal Range: 0-1) Nitrite: positive   (Normal Range: Negative) Leukocyte Esterace: negative   (Normal Range: Negative)     Blood Tests   Date/Time Received: .dte  Date/Time Reported: November 05, 2009   Glucose (fasting): 122 mg/dL   (Normal Range: 63-016) HGBA1C: 5.6%   (Normal Range: Non-Diabetic - 3-6%   Control Diabetic - 6-8%)

## 2010-11-26 NOTE — Assessment & Plan Note (Signed)
Summary: OV   Vital Signs:  Patient profile:   60 year old female Menstrual status:  hysterectomy Height:      63 inches Weight:      170 pounds BMI:     30.22 O2 Sat:      97 % on Room air Pulse rate:   71 / minute Pulse rhythm:   regular Resp:     16 per minute BP sitting:   130 / 90  (left arm)  Vitals Entered By: Adella Hare LPN (February 11, 2010 4:37 PM)  Nutrition Counseling: Patient's BMI is greater than 25 and therefore counseled on weight management options.  O2 Flow:  Room air CC: follow-up visit Is Patient Diabetic? No Pain Assessment Patient in pain? yes     Location: breast Intensity: 8 Type: aching Onset of pain  Chronic   CC:  follow-up visit.  History of Present Illness: 2 month h/o worsening right breast infection, was on doxy, got better now worsening, now smoking 5 ciggs per day, needs to quit before she can have surgery.States she will attempt desperately to quit now , her breast situation is worsening. She denies any recent fever or chills. She goes to [facility] once monthly and reports improved symptoms of depression and anxiety, espescially since she has a new grandchild.  Denies sinus pressure, nasal congestion , ear pain or sore throat. Denies chest congestion, or cough productive of sputum. Denies chest pain, palpitations, PND, orthopnea or leg swelling. Denies abdominal pain, nausea, vomitting, diarrhea or constipation. Denies change in bowel movements or bloody stool. Denies dysuria , frequency, incontinence or hesitancy. Her chronic back pain is unchanged Denies headaches, vertigo, seizures. Denies depression, anxiety or insomnia.controlled on meds and with therapy.    Preventive Screening-Counseling & Management  Alcohol-Tobacco     Smoking Cessation Counseling: yes  Current Medications (verified): 1)  Wellbutrin Xl 300 Mg  Tb24 (Bupropion Hcl) .... Once Daily 2)  Proair Hfa 108 (90 Base) Mcg/act Aers (Albuterol Sulfate) ....  Uad 3)  Nasonex 50 Mcg/act Susp (Mometasone Furoate) .... Two Sprays Each Nostril Qd 4)  Crestor 20 Mg Tabs (Rosuvastatin Calcium) .... Take 1 Tablet By Mouth Once A Day 5)  Symbicort 80-4.5 Mcg/act Aero (Budesonide-Formoterol Fumarate) .... 2 Puffs Twice Daily 6)  Clonidine Hcl 0.3 Mg Tabs (Clonidine Hcl) .... One and A Half Tabs At Bedtime 7)  Alprazolam 0.5 Mg Tabs (Alprazolam) .... Take 1 Tablet By Mouth Three Times A Day 8)  Omeprazole 40 Mg Cpdr (Omeprazole) .... Take 1 Capsule By Mouth Once A Day 9)  Vicodin Hp 10-660 Mg Tabs (Hydrocodone-Acetaminophen) .... Take 1 Tab By Mouth Two Times A Day  Allergies (verified): 1)  ! Asa 2)  ! Pcn  Review of Systems      See HPI Eyes:  Denies blurring, discharge, eye pain, and red eye. Derm:  See HPI. Endo:  Denies cold intolerance, excessive hunger, excessive thirst, excessive urination, heat intolerance, polyuria, and weight change. Heme:  Denies abnormal bruising and bleeding. Allergy:  Complains of seasonal allergies.  Physical Exam  General:  Well-developed,well-nourished,in no acute distress; alert,appropriate and cooperative throughout examination HEENT: No facial asymmetry,  EOMI, No sinus tenderness, TM's Clear, oropharynx  pink and moist.   Chest: Clear to auscultation bilaterally.  CVS: S1, S2, No murmurs, No S3.   Abd: Soft, Nontender.  MS: Adequate ROM spine, hips, shoulders and knees.  Ext: No edema.   CNS: CN 2-12 intact, power tone and sensation normal throughout.  Skin: ulceration of both breasdts , right greater than left, max diameter approx 8 inches, drainage noted on dressing Psych: Good eye contact, normal affect.  Memory intact, not anxious or depressed appearing.  Skin:  3 ulcerating lesions on right breast max dia approx 8inches   Impression & Recommendations:  Problem # 1:  CELLULITIS AND ABSCESS OF OTHER SPECIFIED SITE [phone]) Assessment Deteriorated  The following medications were removed from  the medication list:    Septra Ds 800-160 Mg Tabs (Sulfamethoxazole-trimethoprim) .[name]... Take 1 tablet by mouth two times a day Her updated medication list for this problem includes:    Doxycycline Hyclate 100 Mg Caps (Doxycycline hyclate) .[name]... Take 1 capsule by mouth two times a day  Problem # 2:  NICOTINE ADDICTION (ICD-305.1) Assessment: Improved  Encouraged smoking cessation and discussed different methods for smoking cessation.   Problem # 3:  HYPERTENSION (ICD-401.9) Assessment: Deteriorated  The following medications were removed from the medication list:    Maxzide 75-50 Mg Tabs (Triamterene-hctz) .[name]... Take 1 tablet by mouth once a day Her updated medication list for this problem includes:    Clonidine Hcl 0.3 Mg Tabs (Clonidine hcl) ..... One and a half tabs at bedtime  BP today: 130/90 Prior BP: 130/80 (12/21/2009)  Labs Reviewed: K+: 3.4 (09/25/2008) Creat: : 0.94 (09/25/2008)   Chol: 169 (02/20/2009)   HDL: 36 (02/20/2009)   LDL: 103 (02/20/2009)   TG: 151 (02/20/2009)  Problem # 4:  HYPERLIPIDEMIA (ICD-272.4) Assessment: Comment Only  Her updated medication list for this problem includes:    Crestor 20 Mg Tabs (Rosuvastatin calcium) .[name]... Take 1 tablet by mouth once a day  Labs Reviewed: SGOT: 16 (02/20/2009)   SGPT: 11 (02/20/2009)   HDL:36 (02/20/2009), 50 (09/25/2008)  LDL:103 (02/20/2009), 129 (09/25/2008)  Chol:169 (02/20/2009), 236 (09/25/2008)  Trig:151 (02/20/2009), 286 (09/25/2008)  Problem # 5:  BACK PAIN WITH RADICULOPATHY (ICD-729.2) Assessment: Unchanged continuemeds as before  Problem # 6:  OBESITY, UNSPECIFIED (ICD-278.00) Assessment: Improved  Ht: 63 (02/11/2010)   Wt: 170 (02/11/2010)   BMI: 30.22 (02/11/2010)  Complete Medication List: 1)  Wellbutrin Xl 300 Mg Tb24 (Bupropion hcl) .... Once daily 2)  Proair Hfa 108 (90 Base) Mcg/act Aers (Albuterol sulfate) .... Uad 3)  Nasonex 50 Mcg/act Susp (Mometasone furoate) .... Two sprays each  nostril qd 4)  Crestor 20 Mg Tabs (Rosuvastatin calcium) .... Take 1 tablet by mouth once a day 5)  Symbicort 80-4.5 Mcg/act Aero (Budesonide-formoterol fumarate) .... 2 puffs twice daily 6)  Clonidine Hcl 0.3 Mg Tabs (Clonidine hcl) .... One and a half tabs at bedtime 7)  Alprazolam 0.5 Mg Tabs (Alprazolam) .... Take 1 tablet by mouth three times a day 8)  Omeprazole 40 Mg Cpdr (Omeprazole) .... Take 1 capsule by mouth once a day 9)  Vicodin Hp 10-660 Mg Tabs (Hydrocodone-acetaminophen) .... Take 1 tab by mouth two times a day 10)  Doxycycline Hyclate 100 Mg Caps (Doxycycline hyclate) .... Take 1 capsule by mouth two times a day  Patient Instructions: 1)  Please schedule a follow-up appointment in 4 months. 2)  Tobacco is very bad for your health and your loved ones! You Should stop smoking!.Quit today, and call for your surgery in 2 weeks 3)  Stop Smoking Tips: Choose a Quit date. Cut down before the Quit date. decide what you will do as a substitute when you feel the urge to smoke(gum,toothpick,exercise). 4)  meds sent in for yhe breast infection Prescriptions: DOXYCYCLINE HYCLATE 100 MG CAPS (DOXYCYCLINE  HYCLATE) Take 1 capsule by mouth two times a day  #28 x 0   Entered and Authorized by:   Syliva Overman MD   Signed by:   Syliva Overman MD on 02/11/2010   Method used:   Electronically to        [facility]  [address] (retail)       [address]       [place], [place]  [address]       Ph: [phone]       Fax: [phone]   RxID:   [id number]

## 2010-11-26 NOTE — Assessment & Plan Note (Signed)
Summary: certification   Allergies: 1)  ! Asa 2)  ! Pcn   Complete Medication List: 1)  Wellbutrin Xl 300 Mg Tb24 (Bupropion hcl) .... Once daily 2)  Proair Hfa 108 (90 Base) Mcg/act Aers (Albuterol sulfate) .... Uad 3)  Nasonex 50 Mcg/act Susp (Mometasone furoate) .... Two sprays each nostril qd 4)  Crestor 20 Mg Tabs (Rosuvastatin calcium) .... Take 1 tablet by mouth once a day 5)  Symbicort 80-4.5 Mcg/act Aero (Budesonide-formoterol fumarate) .... 2 puffs twice daily 6)  Clonidine Hcl 0.3 Mg Tabs (Clonidine hcl) .... One and a half tabs at bedtime 7)  Alprazolam 0.5 Mg Tabs (Alprazolam) .... Take 1 tablet by mouth three times a day 8)  Omeprazole 40 Mg Cpdr (Omeprazole) .... Take 1 capsule by mouth once a day 9)  Vicodin Hp 10-660 Mg Tabs (Hydrocodone-acetaminophen) .... Take 1 tab by mouth two times a day 10)  Doxycycline Hyclate 100 Mg Caps (Doxycycline hyclate) .... Take 1 capsule by mouth two times a day 11)  Tramadol Hcl 50 Mg Tabs (Tramadol hcl) .[name].. 1-2 tabs three times a day  Other Orders: Re-certification of Home Health [phone])  Appended Document: certification if there is a different code for certification, this is what needs to be used pls  Appended Document: certification Yes thats the same code Lorin Picket gave me awhile back

## 2010-11-26 NOTE — Progress Notes (Signed)
Summary: speak with nurse  Phone Note Call from Patient   Summary of Call: please call her back [phone] Initial call taken by: Rudene Anda,  February 12, 2010 4:31 PM  Follow-up for Phone Call        returned call, left message Follow-up by: Adella Hare LPN,  February 13, 2010 8:52 AM  Additional Follow-up for Phone Call Additional follow up Details #1::        patient states out of pain med, states last filled 3/26, this is a week early, patient states if sent with different directions they could fill this Additional Follow-up by: Adella Hare LPN,  February 13, 2010 1:11 PM    Additional Follow-up for Phone Call Additional follow up Details #2::    advise I can send in tramadol 50mg  Take 1 to 2 tablet by mouth three times a day #42 to last till she get this until her reg meeds are due, I am not keen on increasing her reg pain meds at this time, pls erxthe tramadol Follow-up by: Syliva Overman MD,  February 13, 2010 4:33 PM  Additional Follow-up for Phone Call Additional follow up Details #3:: Details for Additional Follow-up Action Taken: Patient aware and meds sent in  Additional Follow-up by: Everitt Amber LPN,  February 13, 2010 4:44 PM  New/Updated Medications: TRAMADOL HCL 50 MG TABS (TRAMADOL HCL) 1-2 tabs three times a day Prescriptions: TRAMADOL HCL 50 MG TABS (TRAMADOL HCL) 1-2 tabs three times a day  #42 x 0   Entered by:   Everitt Amber LPN   Authorized by:   Syliva Overman MD   Signed by:   Everitt Amber LPN on 45/40/9811   Method used:   Electronically to        [facility]  [address] (retail)       [address]       [place], [place]  [address]       Ph: [phone]       Fax: [phone]   RxID:   [id number]

## 2010-11-26 NOTE — Assessment & Plan Note (Signed)
Summary: breast infection? - room 3   Vital Signs:  Patient profile:   60 year old female Menstrual status:  hysterectomy Height:      63 inches Weight:      175.25 pounds BMI:     31.16 O2 Sat:      100 % on Room air Pulse rate:   105 / minute Resp:     16 per minute BP sitting:   130 / 80  (left arm)  Vitals Entered By: Adella Hare LPN (December 21, 2009 10:18 AM) CC: possible breast infection Is Patient Diabetic? No Pain Assessment Patient in pain? no        CC:  possible breast infection.  History of Present Illness: Pt has had a breast infection x approx 15 mos.  Needs to have surgery (breast reduction).  Land at [facility].  Needs to quit smoking x 6 wks before they will do the surgery.  Pt is changing her dressings daily. Asks for prescription of Doxycycline.  States this seemed to help in the past.  Was prev smoking 2 ppd.  Has cut down on her own to 1 ppd.  Tried Chantix in the past & didnt like it.  Wants to try the patches.   Is currently not taking Wellbutrin.  Has run out & has no Ins.  Gets her psych meds from psychiatrist.  Also has a hx of Migraines.  Had one a couple days ago.  Took Tylenol for it.  Used Maxalt in the past. The HA was in her Lt orbital area & was her typical migraine.  Preventive Screening-Counseling & Management  Alcohol-Tobacco     Smoking Cessation Counseling: yes  Current Medications (verified): 1)  Wellbutrin Xl 300 Mg  Tb24 (Bupropion Hcl) .... Once Daily 2)  Proair Hfa 108 (90 Base) Mcg/act Aers (Albuterol Sulfate) .... Uad 3)  Nasonex 50 Mcg/act Susp (Mometasone Furoate) .... Two Sprays Each Nostril Qd 4)  Mirtazapine 15 Mg Tabs (Mirtazapine) .... One Tab By Mouth Qhs 5)  Dicyclomine Hcl 20 Mg Tabs (Dicyclomine Hcl) .... One Half Tabs By Mouth Three Times A Day Before Meals 6)  Crestor 20 Mg Tabs (Rosuvastatin Calcium) .... Take 1 Tablet By Mouth Once A Day 7)  Astepro 137 Mcg/spray Soln (Azelastine Hcl) .... 2 Puffs  Twice Daily 8)  Symbicort 80-4.5 Mcg/act Aero (Budesonide-Formoterol Fumarate) .... 2 Puffs Twice Daily 9)  Maxzide 75-50 Mg Tabs (Triamterene-Hctz) .... Take 1 Tablet By Mouth Once A Day 10)  Ambien Cr 12.5 Mg Cr-Tabs (Zolpidem Tartrate) .... Take 1 Tab By Mouth At Bedtime 11)  Clonidine Hcl 0.3 Mg Tabs (Clonidine Hcl) .... One and A Half Tabs At Bedtime 12)  Omeprazole 40 Mg Cpdr (Omeprazole) .... One Cap By Mouth Qd 13)  Alprazolam 0.5 Mg Tabs (Alprazolam) .... Take 1 Tablet By Mouth Three Times A Day 14)  Omeprazole 40 Mg Cpdr (Omeprazole) .... Take 1 Capsule By Mouth Once A Day 15)  Vicodin Hp 10-660 Mg Tabs (Hydrocodone-Acetaminophen) .... Take 1 Tab By Mouth Two Times A Day 16)  Clotrimazole 1 % Crea (Clotrimazole) .... Apply To Rash On Inner Thighs Two Times A Day As Needed 17)  Septra Ds 800-160 Mg Tabs (Sulfamethoxazole-Trimethoprim) .... Take 1 Tablet By Mouth Two Times A Day 18)  Fluconazole 150 Mg Tabs (Fluconazole) .... Take 1 Tablet By Mouth Once A Day  Allergies (verified): 1)  ! Asa 2)  ! Pcn PMH reviewed for relevance  Review of Systems General:  Denies chills and fever. Eyes:  Denies blurring and double vision. ENT:  Denies ear discharge, postnasal drainage, sinus pressure, and sore throat. CV:  Denies chest pain or discomfort. Resp:  Denies cough and shortness of breath. Derm:  Complains of changes in color of skin and poor wound healing. Psych:  Complains of depression. Heme:  Denies fevers.  Physical Exam  General:  Well-developed,well-nourished,in no acute distress; alert,appropriate and cooperative throughout examination Head:  Normocephalic and atraumatic without obvious abnormalities. No apparent alopecia or balding. Eyes:  pupils equal, pupils round, pupils reactive to light, corneas and lenses clear, and no injection.   Ears:  External ear exam shows no significant lesions or deformities.  Otoscopic examination reveals clear canals, tympanic membranes are  intact bilaterally without bulging, retraction, inflammation or discharge. Hearing is grossly normal bilaterally. Nose:  External nasal examination shows no deformity or inflammation. Nasal mucosa are pink and moist without lesions or exudates. Mouth:  Oral mucosa and oropharynx without lesions or exudates.  Teeth in good repair. Neck:  No deformities, masses, or tenderness noted. Lungs:  Normal respiratory effort, chest expands symmetrically. Lungs are clear to auscultation, no crackles or wheezes. Heart:  Normal rate and regular rhythm. S1 and S2 normal without gallop, murmur, click, rub or other extra sounds. Skin:  Bilat inferior breast open ulcer like lesions.  The Lt breast has surrounding hypopigmentation & scarring.  No drainage/pus noted. Cervical Nodes:  No lymphadenopathy noted Psych:  Cognition and judgment appear intact. Alert and cooperative with normal attention span and concentration. No apparent delusions, illusions, hallucinations   Impression & Recommendations:  Problem # 1:  CELLULITIS AND ABSCESS OF OTHER SPECIFIED SITE [phone]) Assessment Unchanged Continue follow up with [facility]. Her updated medication list for this problem includes:    Septra Ds 800-160 Mg Tabs (Sulfamethoxazole-trimethoprim) .[name]... Take 1 tablet by mouth two times a day    Doxycycline Hyclate 100 Mg Tabs (Doxycycline hyclate) .[name]... 1 bid  Problem # 2:  NICOTINE ADDICTION (ICD-305.1) Assessment: Improved  Encouraged smoking cessation and discussed different methods for smoking cessation.  Pt will purchase OTC nicotine patches.    Problem # 3:  DEPRESSION (ICD-311) Assessment: Unchanged Pt will continue follow up with psychiatrist.    Her updated medication list for this problem includes:    Wellbutrin Xl 300 Mg Tb24 (Bupropion hcl) ..... Once daily    Mirtazapine 15 Mg Tabs (Mirtazapine) ..... One tab by mouth qhs    Alprazolam 0.5 Mg Tabs (Alprazolam) .[name]... Take 1 tablet by mouth three  times a day  Problem # 4:  MIGRAINES, HX OF (ICD-V13.8) Assessment: Unchanged Rx written for Maxalt 10 mg.  [facility] will [facility] to see if they can assist her with obtaining this.  Complete Medication List: 1)  Wellbutrin Xl 300 Mg Tb24 (Bupropion hcl) .... Once daily 2)  Proair Hfa 108 (90 Base) Mcg/act Aers (Albuterol sulfate) .... Uad 3)  Nasonex 50 Mcg/act Susp (Mometasone furoate) .... Two sprays each nostril qd 4)  Mirtazapine 15 Mg Tabs (Mirtazapine) .... One tab by mouth qhs 5)  Dicyclomine Hcl 20 Mg Tabs (Dicyclomine hcl) .... One half tabs by mouth three times a day before meals 6)  Crestor 20 Mg Tabs (Rosuvastatin calcium) .... Take 1 tablet by mouth once a day 7)  Astepro 137 Mcg/spray Soln (Azelastine hcl) .... 2 puffs twice daily 8)  Symbicort 80-4.5 Mcg/act Aero (Budesonide-formoterol fumarate) .... 2 puffs twice daily 9)  Maxzide 75-50 Mg Tabs (Triamterene-hctz) .Marland KitchenMarland KitchenMarland Kitchen  Take 1 tablet by mouth once a day 10)  Ambien Cr 12.5 Mg Cr-tabs (Zolpidem tartrate) .... Take 1 tab by mouth at bedtime 11)  Clonidine Hcl 0.3 Mg Tabs (Clonidine hcl) .... One and a half tabs at bedtime 12)  Omeprazole 40 Mg Cpdr (Omeprazole) .... One cap by mouth qd 13)  Alprazolam 0.5 Mg Tabs (Alprazolam) .... Take 1 tablet by mouth three times a day 14)  Omeprazole 40 Mg Cpdr (Omeprazole) .... Take 1 capsule by mouth once a day 15)  Vicodin Hp 10-660 Mg Tabs (Hydrocodone-acetaminophen) .... Take 1 tab by mouth two times a day 16)  Clotrimazole 1 % Crea (Clotrimazole) .... Apply to rash on inner thighs two times a day as needed 17)  Septra Ds 800-160 Mg Tabs (Sulfamethoxazole-trimethoprim) .... Take 1 tablet by mouth two times a day 18)  Fluconazole 150 Mg Tabs (Fluconazole) .... Take 1 tablet by mouth once a day 19)  Doxycycline Hyclate 100 Mg Tabs (Doxycycline hyclate) .[name].. 1 bid 20)  Fluconazole 150 Mg Tabs (Fluconazole) .... As directed 21)  Maxalt 10 Mg Tabs (Rizatriptan benzoate) .... One tab by  mouth as needed for migraine  Patient Instructions: 1)  Please schedule a follow-up appointment as needed. Continue follow up with [facility]. 2)  Tobacco is very bad for your health and your loved ones! You Should stop smoking!. 3)  Try Nicotine patches.  Start with 21 mg patch, apply 1 daily.  You will then decrease the strenth of your patch every 2-4 wks as able.  The patches come in 21, 14, & 7 mg. If the patch bothers you while sleeping then remove it before bed & place a new one on in the morning. Prescriptions: MAXALT 10 MG TABS (RIZATRIPTAN BENZOATE) one tab by mouth as needed for migraine  #18 x 3   Entered by:   Adella Hare LPN   Authorized by:   Esperanza Sheets PA   Signed by:   Adella Hare LPN on 16/07/9603   Method used:   Handwritten   RxID:   [id number] FLUCONAZOLE 150 MG TABS (FLUCONAZOLE) as directed  #1 x 0   Entered and Authorized by:   Esperanza Sheets PA   Signed by:   Esperanza Sheets PA on 12/21/2009   Method used:   Electronically to        [facility]  [address] (retail)       [address]       [place], [place]  [address]       Ph: [phone]       Fax: [phone]   RxID:   [id number] DOXYCYCLINE HYCLATE 100 MG TABS (DOXYCYCLINE HYCLATE) 1 bid  #20 x 0   Entered and Authorized by:   Esperanza Sheets PA   Signed by:   Esperanza Sheets PA on 12/21/2009   Method used:   Electronically to        [facility]  [address] (retail)       [address]       [place], [place]  [address]       Ph: [phone]       Fax: [phone]   RxID:   [id number]

## 2010-11-26 NOTE — Letter (Signed)
Summary: medical release  medical release   Imported By: Lind Guest 04/26/2010 14:35:12  _____________________________________________________________________  External Attachment:    Type:   Image     Comment:   External Document

## 2010-11-26 NOTE — Medication Information (Signed)
Summary: Tax adviser   Imported By: Lind Guest 07/18/2010 08:06:38  _____________________________________________________________________  External Attachment:    Type:   Image     Comment:   External Document

## 2010-11-26 NOTE — Progress Notes (Signed)
Summary: nurse  Phone Note Call from Patient   Summary of Call: pt said breast are inflammed and want s to get a anitobotic like doc did last time. and also get patches for smoking so she can have surgery. [phone] Initial call taken by: Rudene Anda,  December 19, 2009 1:56 PM  Follow-up for Phone Call        Patient is having some drainage from breast. Advised her she needed to make an appointment. Luann to schedule Follow-up by: Everitt Amber LPN,  December 19, 2009 4:16 PM

## 2010-11-26 NOTE — Miscellaneous (Signed)
Summary: refill  Clinical Lists Changes  Medications: Rx of ALPRAZOLAM 0.5 MG TABS (ALPRAZOLAM) Take 1 tablet by mouth three times a day;  #90 x 3;  Signed;  Entered by: Worthy Keeler LPN;  Authorized by: Syliva Overman MD;  Method used: Printed then faxed to [facility] [address] [place], [place], [place]  [address], Ph: [phone], Fax: [phone]    Prescriptions: ALPRAZOLAM 0.5 MG TABS (ALPRAZOLAM) Take 1 tablet by mouth three times a day  #90 x 3   Entered by:   Worthy Keeler LPN   Authorized by:   Syliva Overman MD   Signed by:   Worthy Keeler LPN on 13/05/6577   Method used:   Printed then faxed to ...       [facility]  [address] (retail)       [address]       [place], [place]  [address]       Ph: [phone]       Fax: [phone]   RxID:   [id number]

## 2010-11-26 NOTE — Progress Notes (Signed)
Summary: dermatology  dermatology   Imported By: Lind Guest 10/30/2009 07:51:16  _____________________________________________________________________  External Attachment:    Type:   Image     Comment:   External Document

## 2010-11-26 NOTE — Progress Notes (Signed)
Summary: MAMMO FOLLOW UP  Phone Note Call from Patient   Summary of Call: HOSPITAL CALLED AND SAID THAT THEY  FAXED  OVER SOME PAPERS BACK IN NOVEMBER TO HAVE [facility]. HER MAMMO. AND SHE NEEDS TO FOLLOW UP WITH THIS Initial call taken by: Lind Guest,  December 18, 2009 11:09 AM  Follow-up for Phone Call        called pt and left a message for pt to call me  Follow-up by: Rudene Anda,  December 18, 2009 11:29 AM  Additional Follow-up for Phone Call Additional follow up Details #1::        pt states she can't do mammo right now. her breast are inflammed. pt states she will call hospital and shedule when breast get better.  Additional Follow-up by: Rudene Anda,  December 19, 2009 1:55 PM

## 2010-11-26 NOTE — Progress Notes (Signed)
Summary: pain med  Phone Note Call from Patient   Summary of Call: pt needs pain meds today. [facility] Would like to get them Asap  [phone] Initial call taken by: Rudene Anda,  February 12, 2010 1:39 PM  Follow-up for Phone Call        Pain meds sent and pt aware Follow-up by: Everitt Amber LPN,  February 12, 2010 3:05 PM

## 2010-11-26 NOTE — Assessment & Plan Note (Signed)
Summary: office visit   Vital Signs:  Patient profile:   60 year old female Menstrual status:  hysterectomy Height:      63 inches Weight:      164.25 pounds BMI:     29.20 O2 Sat:      98 % Pulse rate:   67 / minute Pulse rhythm:   regular Resp:     16 per minute BP sitting:   180 / 90  (right arm) Cuff size:   regular  Vitals Entered By: Everitt Amber LPN (July 11, 2010 1:00 PM)  Nutrition Counseling: Patient's BMI is greater than 25 and therefore counseled on weight management options. CC: Follow up chronic problems   CC:  Follow up chronic problems.  History of Present Illness: Pt recently had a double mastectome after her right breast essentially became necrotic, fortunately she did not develop sepsis. She ahs recovered well as far as the surgical site and her mental, health are concerned, howebveer, she has had chest congestion with cough and sputum , as well as inc fatigue.  Current Medications (verified): 1)  Wellbutrin Xl 300 Mg  Tb24 (Bupropion Hcl) .... Once Daily 2)  Proair Hfa 108 (90 Base) Mcg/act Aers (Albuterol Sulfate) .... Uad 3)  Crestor 20 Mg Tabs (Rosuvastatin Calcium) .... Take 1 Tablet By Mouth Once A Day 4)  Symbicort 80-4.5 Mcg/act Aero (Budesonide-Formoterol Fumarate) .... 2 Puffs Twice Daily 5)  Clonidine Hcl 0.3 Mg Tabs (Clonidine Hcl) .... One and A Half Tabs At Bedtime 6)  Alprazolam 0.5 Mg Tabs (Alprazolam) .... Take 1 Tablet By Mouth Three Times A Day 7)  Vicodin Hp 10-660 Mg Tabs (Hydrocodone-Acetaminophen) .... Take 1 Tab By Mouth Two Times A Day 8)  Zolpidem Tartrate 12.5 Mg Cr-Tabs (Zolpidem Tartrate) .... Take 1 Tab By Mouth At Bedtime 9)  Maxzide 75-50 Mg Tabs (Triamterene-Hctz) .... Take 1 Tablet By Mouth Once A Day 10)  Mirtazapine 30 Mg Tabs (Mirtazapine) .... Take 1 Tab By Mouth At Bedtime  Allergies (verified): 1)  ! Asa 2)  ! Pcn  Past History:  Past medical, surgical, family and social histories (including risk factors)  reviewed, and no changes noted (except as noted below).  Past Medical History: Reviewed history from 08/17/2008 and no changes required. Current Problems:  MIGRAINES, HX OF (ICD-V13.8) PANIC ATTACK (ICD-300.01) GERD (ICD-530.81) DEPRESSION (ICD-311) HYPERLIPIDEMIA (ICD-272.4) HYPERTENSION (ICD-401.9) hyperlipidemia urinary incontinence back pain with radiculopathy nicotine  Past Surgical History: Cholecystectomy-2002 Hysterectomy-partial 1981 Oophorectomy- unilateral 1981 Hiatal hernia repair-1997 Removal of mild ducts from Lt Breast- approx 1982 bleeding ulcer repair-1992 Left ankle surgery 2008 Aug 9th 2011-Double mastectomy  Family History: Reviewed history from 11/05/2009 and no changes required. Mother died at 48 due staph infection, she was diabetic, had heart ds, lupus Father died at age 63, cause unknown, TB,  Sister x 1  Brother x1 dibaetic  Social History: Reviewed history from 11/05/2009 and no changes required. Occupation:unemployed Current Smoker: half PPD Drug use-no Children: 3  Review of Systems      See HPI General:  Complains of chills, fatigue, fever, loss of appetite, malaise, sleep disorder, weakness, and weight loss; 4 week h/o downhill symptoms following recent bilat mastectome.. Eyes:  Denies discharge and red eye. ENT:  Denies hoarseness, nasal congestion, and sinus pressure. CV:  Denies chest pain or discomfort and swelling of feet. Resp:  Complains of cough, shortness of breath, sputum productive, and wheezing; 1 week hx with yellow sputum. GI:  Complains of constipation and loss of  appetite; denies abdominal pain, nausea, and vomiting blood; abdominal distension , no BM for 5 days, laxative provided relief with diarreah, incomplete evacuation however. GU:  Denies dysuria and urinary frequency. MS:  Complains of low back pain and mid back pain; mid and upper back pain relieved with pain meds. Psych:  Complains of anxiety and depression;  denies mental problems, suicidal thoughts/plans, thoughts of violence, and unusual visions or sounds; mental heal;thiomproved since surgery . Endo:  Denies excessive thirst and excessive urination. Heme:  Denies abnormal bruising and bleeding. Allergy:  Complains of seasonal allergies.  Physical Exam  General:  Well-developed,well-nourished,in no acute distress; alert,appropriate and cooperative throughout examination HEENT: No facial asymmetry,  EOMI, No sinus tenderness, TM's Clear, oropharynx  pink and moist.   Chest: decreased air entry, scattered crackles, no wheezes CVS: S1, S2, No murmurs, No S3.   Abd: Soft, Nontender.  MS: Adequate ROM spine, hips, shoulders and knees.  Ext: No edema.   CNS: CN 2-12 intact, power tone and sensation normal throughout.   Skin: Intact, surgical mastectomy scars well healed with no erytema or drainage Psych: Good eye contact, normal affect.  Memory intact, not anxious or depressed appearing.    Impression & Recommendations:  Problem # 1:  ACUTE BRONCHITIS (ICD-466.0) Assessment Comment Only  The following medications were removed from the medication list:    Doxycycline Hyclate 100 Mg Caps (Doxycycline hyclate) .[name]... Take 1 capsule by mouth two times a day Her updated medication list for this problem includes:    Proair Hfa 108 (90 Base) Mcg/act Aers (Albuterol sulfate) ..... Uad    Symbicort 80-4.5 Mcg/act Aero (Budesonide-formoterol fumarate) .[name]... 2 puffs twice daily    Septra Ds 800-160 Mg Tabs (Sulfamethoxazole-trimethoprim) .[name]... Take 1 tablet by mouth two times a day    Tessalon Perles 100 Mg Caps (Benzonatate) .[name]... Take 1 capsule by mouth three times a day  Orders: CXR- 2view (CXR) Rocephin  250mg  (J4782) Admin of Therapeutic Inj  intramuscular or subcutaneous (95621)  Problem # 2:  NICOTINE ADDICTION (ICD-305.1) Assessment: Improved  Encouraged smoking cessation and discussed different methods for smoking cessation.    Problem # 3:  DEPRESSION (ICD-311) Assessment: Improved  Her updated medication list for this problem includes:    Wellbutrin Xl 300 Mg Tb24 (Bupropion hcl) ..... Once daily    Alprazolam 0.5 Mg Tabs (Alprazolam) .[name]... Take 1 tablet by mouth three times a day    Mirtazapine 30 Mg Tabs (Mirtazapine) .[name]... Take 1 tab by mouth at bedtime  Problem # 4:  HYPERTENSION (ICD-401.9) Assessment: Deteriorated  Her updated medication list for this problem includes:    Clonidine Hcl 0.3 Mg Tabs (Clonidine hcl) ..... One and a half tabs at bedtime    Maxzide 75-50 Mg Tabs (Triamterene-hctz) .[name]... Take 1 tablet by mouth once a day    Amlodipine Besylate 5 Mg Tabs (Amlodipine besylate) .[name]... Take 1 tablet by mouth once a day  Orders: T-Basic Metabolic Panel [id number])  BP today: 180/90 Prior BP: 130/90 (02/11/2010)  Labs Reviewed: K+: 3.4 (09/25/2008) Creat: : 0.94 (09/25/2008)   Chol: 169 (02/20/2009)   HDL: 36 (02/20/2009)   LDL: 103 (02/20/2009)   TG: 151 (02/20/2009)  Problem # 5:  HYPERLIPIDEMIA (ICD-272.4) Assessment: Comment Only  Her updated medication list for this problem includes:    Crestor 20 Mg Tabs (Rosuvastatin calcium) .[name]... Take 1 tablet by mouth once a day Low fat diet discussed and encouraged, and literature also given  Orders: T-Lipid Profile ([id number]) T-Hepatic  Function [phone])  Labs Reviewed: SGOT: 16 (02/20/2009)   SGPT: 11 (02/20/2009)   HDL:36 (02/20/2009), 50 (09/25/2008)  LDL:103 (02/20/2009), 129 (09/25/2008)  Chol:169 (02/20/2009), 236 (09/25/2008)  Trig:151 (02/20/2009), 286 (09/25/2008)  Complete Medication List: 1)  Wellbutrin Xl 300 Mg Tb24 (Bupropion hcl) .... Once daily 2)  Proair Hfa 108 (90 Base) Mcg/act Aers (Albuterol sulfate) .... Uad 3)  Crestor 20 Mg Tabs (Rosuvastatin calcium) .... Take 1 tablet by mouth once a day 4)  Symbicort 80-4.5 Mcg/act Aero (Budesonide-formoterol fumarate) .... 2 puffs twice daily 5)  Clonidine Hcl 0.3  Mg Tabs (Clonidine hcl) .... One and a half tabs at bedtime 6)  Alprazolam 0.5 Mg Tabs (Alprazolam) .... Take 1 tablet by mouth three times a day 7)  Vicodin Hp 10-660 Mg Tabs (Hydrocodone-acetaminophen) .... Take 1 tab by mouth two times a day 8)  Zolpidem Tartrate 12.5 Mg Cr-tabs (Zolpidem tartrate) .... Take 1 tab by mouth at bedtime 9)  Maxzide 75-50 Mg Tabs (Triamterene-hctz) .... Take 1 tablet by mouth once a day 10)  Mirtazapine 30 Mg Tabs (Mirtazapine) .... Take 1 tab by mouth at bedtime 11)  Septra Ds 800-160 Mg Tabs (Sulfamethoxazole-trimethoprim) .... Take 1 tablet by mouth two times a day 12)  Tessalon Perles 100 Mg Caps (Benzonatate) .... Take 1 capsule by mouth three times a day 13)  Amlodipine Besylate 5 Mg Tabs (Amlodipine besylate) .... Take 1 tablet by mouth once a day  Other Orders: T-CBC w/Diff ([id number])  Patient Instructions: 1)  Please schedule a follow-up appointment in 1 month. 2)  You are being treated for bronchitis, you will get an injection in the office, you need labwork, and a CXR. 3)  PLS start eating small amt regularly as you are able. 4)  I am thankful your surgery has happened and that it went well. 5)  BP is high, new med added Prescriptions: AMLODIPINE BESYLATE 5 MG TABS (AMLODIPINE BESYLATE) Take 1 tablet by mouth once a day  #30 x 1   Entered by:   Everitt Amber LPN   Authorized by:   Syliva Overman MD   Signed by:   Everitt Amber LPN on 29/56/2130   Method used:   Electronically to        [facility] [phone]* (retail)       [address]       [place], [place]  [address]       Ph: [phone] or [phone]       Fax: [phone]   RxID:   [id number] TESSALON PERLES 100 MG CAPS (BENZONATATE) Take 1 capsule by mouth three times a day  #42 x 0   Entered by:   Everitt Amber LPN   Authorized by:   Syliva Overman MD   Signed by:   Everitt Amber LPN on 84/16/6063   Method used:   Electronically to        [facility]  [address] (retail)       [address]       [place], [place]  [address]       Ph: [phone]       Fax: [phone]   RxID:   [id number] AMLODIPINE BESYLATE 5 MG TABS (AMLODIPINE BESYLATE) Take 1 tablet by mouth once a day  #30 x 1   Entered and Authorized by:   Syliva Overman MD   Signed by:   Claris Che  Simpson MD on 07/11/2010   Method used:   Electronically to        [facility]  [address] (retail)       [address]       [place], [place]  [address]       Ph: [phone]       Fax: [phone]   RxID:   [id number] TESSALON PERLES 100 MG CAPS (BENZONATATE) Take 1 capsule by mouth three times a day  #30 x 0   Entered and Authorized by:   Syliva Overman MD   Signed by:   Syliva Overman MD on 07/11/2010   Method used:   Electronically to        [facility]  [address] (retail)       [address]       [place], [place]  [address]       Ph: [phone]       Fax: [phone]   RxID:   [id number] SEPTRA DS 800-160 MG TABS (SULFAMETHOXAZOLE-TRIMETHOPRIM) Take 1 tablet by mouth two times a day  #20 x 0   Entered and Authorized by:   Syliva Overman MD   Signed by:   Syliva Overman MD on 07/11/2010   Method used:   Electronically to        [facility]  [address] (retail)       [address]       [place], [place]  [address]       Ph: [phone]       Fax: [phone]   RxID:   [id number]    Medication Administration  Injection # 1:    Medication: Rocephin  250mg     Diagnosis: ACUTE BRONCHITIS (ICD-466.0)    Route: IM    Site: RUOQ gluteus    Exp Date: 1/13    Lot #: [id number]    Mfr: sandoz    Comments: rocephin 500mg  given    Patient tolerated injection without complications    Given by: Adella Hare LPN (July 11, 2010 2:16 PM)  Orders Added: 1)  CXR- 2view [CXR] 2)  Est. Patient Level IV [38101] 3)  T-Basic Metabolic Panel [[id number]] 4)  T-Lipid Profile  [80061-22930] 5)  T-Hepatic Function [80076-22960] 6)  T-CBC w/Diff [75102-58527] 7)  Rocephin  250mg  [J0696] 8)  Admin of Therapeutic Inj  intramuscular or subcutaneous [78242]

## 2010-11-26 NOTE — Letter (Signed)
Summary: PATIENT ASSISTANCE RX  PATIENT ASSISTANCE RX   Imported By: Lind Guest 12/19/2009 08:50:04  _____________________________________________________________________  External Attachment:    Type:   Image     Comment:   External Document

## 2010-11-26 NOTE — Letter (Signed)
Summary: PATIENT ASSISTANCE RX  PATIENT ASSISTANCE RX   Imported By: Lind Guest 02/27/2010 10:58:00  _____________________________________________________________________  External Attachment:    Type:   Image     Comment:   External Document

## 2010-11-26 NOTE — Assessment & Plan Note (Signed)
Summary: F UP   Vital Signs:  Patient profile:   60 year old female Menstrual status:  hysterectomy Height:      63 inches Weight:      160.25 pounds BMI:     28.49 O2 Sat:      97 % on Room air Pulse rate:   101 / minute Pulse rhythm:   regular Resp:     16 per minute BP sitting:   120 / 80  (left arm)  Vitals Entered By: Adella Hare LPN (August 13, 2010 1:32 PM)  Nutrition Counseling: Patient's BMI is greater than 25 and therefore counseled on weight management options.  O2 Flow:  Room air CC: family history of colon cancer Is Patient Diabetic? No   CC:  family history of colon cancer.  History of Present Illness: Continued abdominal distension and spasm since breast surgery, feels nauseated, gall bladder has been removed.BM are regular, pt is uncom,fortable and vbery concerned about this. Reports  that  she has generally not been doing well. Denies recent fever or chills. Denies sinus pressure, nasal congestion , ear pain or sore throat. Denies chest congestion, or cough productive of sputum. Denies chest pain, palpitations, PND, orthopnea or leg swelling.  Denies change in bowel movements or bloody stool. Denies dysuria , frequency, incontinence or hesitancy.  Denies headaches, vertigo, seizures.  c/o increased drainage from the right breast in the past severa;l days   Preventive Screening-Counseling & Management  Alcohol-Tobacco     Smoking Cessation Counseling: yes  Current Medications (verified): 1)  Wellbutrin Xl 300 Mg  Tb24 (Bupropion Hcl) .... Once Daily 2)  Proair Hfa 108 (90 Base) Mcg/act Aers (Albuterol Sulfate) .... Uad 3)  Crestor 20 Mg Tabs (Rosuvastatin Calcium) .... Take 1 Tablet By Mouth Once A Day 4)  Symbicort 80-4.5 Mcg/act Aero (Budesonide-Formoterol Fumarate) .... 2 Puffs Twice Daily 5)  Clonidine Hcl 0.3 Mg Tabs (Clonidine Hcl) .... One and A Half Tabs At Bedtime 6)  Alprazolam 0.5 Mg Tabs (Alprazolam) .... Take 1 Tablet By Mouth Three  Times A Day 7)  Vicodin Hp 10-660 Mg Tabs (Hydrocodone-Acetaminophen) .... Take 1 Tab By Mouth Two Times A Day 8)  Zolpidem Tartrate 12.5 Mg Cr-Tabs (Zolpidem Tartrate) .... Take 1 Tab By Mouth At Bedtime 9)  Maxzide 75-50 Mg Tabs (Triamterene-Hctz) .... Take 1 Tablet By Mouth Once A Day 10)  Mirtazapine 30 Mg Tabs (Mirtazapine) .... Take 1 Tab By Mouth At Bedtime 11)  Amlodipine Besylate 5 Mg Tabs (Amlodipine Besylate) .... Take 1 Tablet By Mouth Once A Day  Allergies (verified): 1)  ! Asa 2)  ! Pcn  Past History:  Family history reviewed for relevance to current acute and chronic problems.  Family History: Reviewed history from 11/05/2009 and no changes required. Mother died at 37 due staph infection, she was diabetic, had heart ds, lupus Father died at age 51, cause unknown, TB,  Sister x 1  Brother x1 dibaetic  Review of Systems      See HPI General:  Complains of fatigue and sleep disorder. Eyes:  Denies discharge and red eye. Resp:  Complains of cough and shortness of breath; denies sputum productive; chronic nicotine. GU:  Complains of dysuria and urinary frequency; 1 week hiistory. MS:  Complains of low back pain and mid back pain; chroinic. Psych:  Complains of anxiety, depression, and mental problems; denies suicidal thoughts/plans, thoughts of violence, and unusual visions or sounds; continues therapy. Endo:  Denies excessive thirst and excessive urination.  Heme:  Denies abnormal bruising and bleeding. Allergy:  Complains of seasonal allergies.  Physical Exam  General:  Well-developed,well-nourished,in no acute distress; alert,appropriate and cooperative throughout examination HEENT: No facial asymmetry,  EOMI, No sinus tenderness, TM's Clear, oropharynx  pink and moist.   Chest: Clear to auscultation bilaterally.  CVS: S1, S2, No murmurs, No S3.   JYN:WGNFAOZH, diffuse superficial tenderness, unable to determine if pt has organomegaly or masses. MS: decreased   ROM spine,adequate in  hips, shoulders and knees.  Ext: No edema.   CNS: CN 2-12 intact, power tone and sensation normal throughout. Skin: recednt surgical scars not examined, pt on the way to the surgeon Psych: Good eye contact, normal affect.  Memory intact, not anxious or depressed appearing.    Impression & Recommendations:  Problem # 1:  PELVIC  PAIN (ICD-789.09) Assessment Deteriorated  Her updated medication list for this problem includes:    Vicodin Hp 10-660 Mg Tabs (Hydrocodone-acetaminophen) .[name]... Take 1 tab by mouth two times a day  Orders: Radiology Referral (Radiology)  Problem # 2:  ABDOMINAL DISTENSION (ICD-787.3) Assessment: Comment Only  Orders: Radiology Referral (Radiology)  Problem # 3:  NICOTINE ADDICTION (ICD-305.1) Assessment: Unchanged  Encouraged smoking cessation and discussed different methods for smoking cessation.   Problem # 4:  DEPRESSION (ICD-311) Assessment: Unchanged  Her updated medication list for this problem includes:    Wellbutrin Xl 300 Mg Tb24 (Bupropion hcl) ..... Once daily    Alprazolam 0.5 Mg Tabs (Alprazolam) .[name]... Take 1 tablet by mouth three times a day    Mirtazapine 30 Mg Tabs (Mirtazapine) .[name]... Take 1 tab by mouth at bedtime  Discussed treatment options, including trial of antidpressant medication. Will refer to behavioral health. Follow-up call in in 24-48 hours and recheck in 2 weeks, sooner as needed. Patient agrees to call if any worsening of symptoms or thoughts of doing harm arise. Verified that the patient has no suicidal ideation at this time.   Problem # 5:  HYPERTENSION (ICD-401.9) Assessment: Improved  Her updated medication list for this problem includes:    Clonidine Hcl 0.3 Mg Tabs (Clonidine hcl) ..... One and a half tabs at bedtime    Maxzide 75-50 Mg Tabs (Triamterene-hctz) .[name]... Take 1 tablet by mouth once a day    Amlodipine Besylate 5 Mg Tabs (Amlodipine besylate) .[name]... Take 1 tablet by mouth once a  day  BP today: 120/80 Prior BP: 180/90 (07/11/2010)  Labs Reviewed: K+: 4.4 (07/11/2010) Creat: : 1.00 (07/11/2010)   Chol: 225 (07/11/2010)   HDL: 36 (07/11/2010)   LDL: 136 (07/11/2010)   TG: 264 (07/11/2010)  Problem # 6:  HYPERLIPIDEMIA (ICD-272.4) Assessment: Deteriorated  Her updated medication list for this problem includes:    Crestor 20 Mg Tabs (Rosuvastatin calcium) .[name]... Take 1 tablet by mouth once a day Low fat dietdiscussed and encouraged  Labs Reviewed: SGOT: 20 (07/11/2010)   SGPT: 18 (07/11/2010)   HDL:36 (07/11/2010), 36 (02/20/2009)  LDL:136 (07/11/2010), 103 (02/20/2009)  Chol:225 (07/11/2010), 169 (02/20/2009)  Trig:264 (07/11/2010), 151 (02/20/2009)  Problem # 7:  ACUTE CYSTITIS (ICD-595.0) Assessment: Comment Only  The following medications were removed from the medication list:    Septra Ds 800-160 Mg Tabs (Sulfamethoxazole-trimethoprim) .[name]... Take 1 tablet by mouth two times a day  Orders: Urinalysis (81003-65000)negative  Encouraged to push clear liquids, get enough rest, and take acetaminophen as needed. To be seen in 10 days if no improvement, sooner if worse.  Complete Medication List: 1)  Wellbutrin Xl 300  Mg Tb24 (Bupropion hcl) .... Once daily 2)  Proair Hfa 108 (90 Base) Mcg/act Aers (Albuterol sulfate) .... Uad 3)  Crestor 20 Mg Tabs (Rosuvastatin calcium) .... Take 1 tablet by mouth once a day 4)  Symbicort 80-4.5 Mcg/act Aero (Budesonide-formoterol fumarate) .... 2 puffs twice daily 5)  Clonidine Hcl 0.3 Mg Tabs (Clonidine hcl) .... One and a half tabs at bedtime 6)  Alprazolam 0.5 Mg Tabs (Alprazolam) .... Take 1 tablet by mouth three times a day 7)  Vicodin Hp 10-660 Mg Tabs (Hydrocodone-acetaminophen) .... Take 1 tab by mouth two times a day 8)  Zolpidem Tartrate 12.5 Mg Cr-tabs (Zolpidem tartrate) .... Take 1 tab by mouth at bedtime 9)  Maxzide 75-50 Mg Tabs (Triamterene-hctz) .... Take 1 tablet by mouth once a day 10)  Mirtazapine 30 Mg  Tabs (Mirtazapine) .... Take 1 tab by mouth at bedtime 11)  Amlodipine Besylate 5 Mg Tabs (Amlodipine besylate) .... Take 1 tablet by mouth once a day 12)  Dicyclomine Hcl 10 Mg Caps (Dicyclomine hcl) .... Take 1 capsule by mouth three times a day as needed for abdominal pain and spasm 13)  Fluconazole 150 Mg Tabs (Fluconazole) .... Take 1 tablet by mouth once a day as needed for vaginal itching and burning  Other Orders: Influenza Vaccine NON MCR (09604)  Patient Instructions: 1)  Please schedule a follow-up appointment in 4 months. 2)  You are being referred for abdominal and pelvic scan to eval pain and swelling. 3)  New med is sent in for abdominal cramps. 4)  Your BP is great. 5)  Flu vaccine today 6)  Tobacco is very bad for your health and your loved ones! You Should stop smoking!. 7)  Stop Smoking Tips: Choose a Quit date. Cut down before the Quit date. decide what you will do as a substitute when you feel the urge to smoke(gum,toothpick,exercise). Prescriptions: ALPRAZOLAM 0.5 MG TABS (ALPRAZOLAM) Take 1 tablet by mouth three times a day  #90 x 3   Entered by:   Adella Hare LPN   Authorized by:   Syliva Overman MD   Signed by:   Adella Hare LPN on 54/06/8118   Method used:   Printed then faxed to ...       [facility]  [address] [address] [facility] (retail)       [address]       [place], [place]  [address]       Ph: [phone]       Fax: [phone]   RxID:   [id number] VICODIN HP 10-660 MG TABS (HYDROCODONE-ACETAMINOPHEN) Take 1 tab by mouth two times a day  #60 x 3   Entered by:   Adella Hare LPN   Authorized by:   Syliva Overman MD   Signed by:   Adella Hare LPN on 64/40/3474   Method used:   Printed then faxed to ...       [facility]  [address] [address] [facility] (retail)       [address]       [place], [place]  [address]       Ph: [phone]       Fax: [phone]   RxID:   [id number] CLONIDINE HCL 0.3 MG TABS (CLONIDINE HCL) one and a half  tabs at bedtime  #45 Each x 3   Entered by:   Adella Hare LPN   Authorized by:  Syliva Overman MD   Signed by:   Adella Hare LPN on 16/07/9603   Method used:   Printed then faxed to ...       [facility] [address] [facility] (retail)       [address]       [place], [place]  [address]       Ph: [phone]       Fax: [phone]   RxID:   [id number] FLUCONAZOLE 150 MG TABS (FLUCONAZOLE) Take 1 tablet by mouth once a day as needed for vaginal itching and burning  #3 x 0   Entered and Authorized by:   Syliva Overman MD   Signed by:   Syliva Overman MD on 08/13/2010   Method used:   Electronically to        [facility] [address] [facility] (retail)       [address]       [place], [place]  [address]       Ph: [phone]       Fax: [phone]   RxID:   [id number] DICYCLOMINE HCL 10 MG CAPS (DICYCLOMINE HCL) Take 1 capsule by mouth three times a day as needed for abdominal pain and spasm  #90 x 1   Entered and Authorized by:   Syliva Overman MD   Signed by:   Syliva Overman MD on 08/13/2010   Method used:   Electronically to        [facility]  [address] [facility] [address] [facility] (retail)       [address]       [place], [place]  [address]       Ph: [phone]       Fax: [phone]   RxID:   [id number]    Orders Added: 1)  Urinalysis [81003-65000] 2)  Est. Patient Level IV [[address]106] 3)  Influenza Vaccine NON MCR [00028] 4)  Radiology Referral [Radiology] 5)  Radiology Referral [Radiology]   Immunizations Administered:  Influenza Vaccine # 1:    Vaccine Type: Fluvax Non-MCR    Site: left deltoid    Mfr: novartis    Dose: 0.5 ml    Route: IM    Given by: Adella Hare LPN    Exp. Date: 02/2011    Lot #: [id number]    VIS given: 05/20/07 version given August 13, 2010.   Immunizations Administered:  Influenza Vaccine # 1:    Vaccine Type: Fluvax Non-MCR    Site: left deltoid    Mfr: novartis    Dose: 0.5 ml    Route: IM    Given  by: Adella Hare LPN    Exp. Date: 02/2011    Lot #: [id number]    VIS given: 05/20/07 version given August 13, 2010.  Laboratory Results   Urine Tests  Date/Time Received: August 13, 2010 2:13 PM  Date/Time Reported: August 13, 2010 2:14 PM   Routine Urinalysis   Color: yellow Appearance: Clear Glucose: negative   (Normal Range: Negative) Bilirubin: negative   (Normal Range: Negative) Ketone: negative   (Normal Range: Negative) Spec. Gravity: 1.015   (Normal Range: 1.003-1.035) Blood: negative   (Normal Range: Negative) pH: 5.5   (Normal Range: 5.0-8.0) Protein: negative   (Normal Range: Negative) Urobilinogen: 0.2   (Normal Range: 0-1) Nitrite: negative   (Normal Range: Negative) Leukocyte Esterace: negative   (  Normal Range: Negative)

## 2010-11-27 ENCOUNTER — Ambulatory Visit (HOSPITAL_COMMUNITY): Admit: 2010-11-27 | Payer: Self-pay | Admitting: Psychiatry

## 2010-11-27 ENCOUNTER — Encounter (INDEPENDENT_AMBULATORY_CARE_PROVIDER_SITE_OTHER): Payer: Self-pay | Admitting: Psychiatry

## 2010-11-27 DIAGNOSIS — F329 Major depressive disorder, single episode, unspecified: Secondary | ICD-10-CM

## 2010-11-27 DIAGNOSIS — F3289 Other specified depressive episodes: Secondary | ICD-10-CM

## 2010-11-28 NOTE — Progress Notes (Signed)
Summary: please advise   Phone Note Call from Patient   Summary of Call: patient returned my call about her MRI I had to reschedule that appt due to she missed her appt this morning.  She states she needs a valium called in so she can do the MRI.  Please advise.  Her appt is for Jan. 3rd at [facility] to register at 12:30.  Patient is aware of appt. Initial call taken by: Curtis Sites,  October 23, 2010 1:45 PM  Follow-up for Phone Call        pls refill the valium prescribed on 07/2010 and let her know Follow-up by: Syliva Overman MD,  October 23, 2010 4:56 PM  Additional Follow-up for Phone Call Additional follow up Details #1::        CAME BY CHECKING ON THIS Additional Follow-up by: Lind Guest,  October 24, 2010 10:56 AM    Prescriptions: DIAZEPAM 5 MG TABS (DIAZEPAM) one tablet half hour before procedure , repeat after 15 mins if needed, pt needs a driver  #2 x 0   Entered by:   Adella Hare LPN   Authorized by:   Syliva Overman MD   Signed by:   Adella Hare LPN on 16/07/9603   Method used:   Printed then faxed to ...       [facility] [address] [facility] ([facility])       [address]       [place], [place]  [address]       Ph: [phone]       Fax: [phone]   RxID:   [id number]  MED SENT, CALLED PATIENT, LEFT MESSAGE  Appended Document: please advise  patient is aware she called back.
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
## 2010-11-28 NOTE — Letter (Signed)
Summary: RX ASSISTANCE  RX ASSISTANCE   Imported By: Lind Guest 11/21/2010 11:25:25  _____________________________________________________________________  External Attachment:    Type:   Image     Comment:   External Document
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
## 2010-11-28 NOTE — Letter (Signed)
Summary: Letter  Letter   Imported By: Lind Guest 11/13/2010 14:17:31  _____________________________________________________________________  External Attachment:    Type:   Image     Comment:   External Document

## 2010-11-28 NOTE — Letter (Signed)
Summary: RX ASSISTANCE  RX ASSISTANCE   Imported By: Lind Guest 11/05/2010 17:00:35  _____________________________________________________________________  External Attachment:    Type:   Image     Comment:   External Document

## 2010-11-28 NOTE — Progress Notes (Addendum)
Summary: COULD NOT DO MRI  Phone Note Call from Patient   Summary of Call: WENT TO GO  TO DO HER MRI AND COULD NOT DO IT SHE WAS CLaustrophobic Initial call taken by: Lind Guest,  November 04, 2010 3:33 PM  Follow-up for Phone Call        duid she take valium before, if not pls refill script in october, let her know,  Follow-up by: Syliva Overman MD,  November 05, 2010 5:11 PM  Additional Follow-up for Phone Call Additional follow up Details #1::        called patient, left message Additional Follow-up by: Adella Hare LPN,  November 06, 2010 9:39 AM    Additional Follow-up for Phone Call Additional follow up Details #2::    called patient, left message Follow-up by: Adella Hare LPN,  November 11, 2010 8:42 AM  Additional Follow-up for Phone Call Additional follow up Details #3:: Details for Additional Follow-up Action Taken: patient states that she took the med and it did not work Additional Follow-up by: Adella Hare LPN,  November 11, 2010 11:55 AM  New/Updated Medications: ATIVAN 2 MG TABS (LORAZEPAM) one tablet 15 mins before test, rept in 15 nminutes if needed for anxiety Prescriptions: ATIVAN 2 MG TABS (LORAZEPAM) one tablet 15 mins before test, rept in 15 nminutes if needed for anxiety  #2 x 0   Entered and Authorized by:   Syliva Overman MD   Signed by:   Syliva Overman MD on 11/11/2010   Method used:   Historical   RxID:   [id number]  pls advise her to try ativan instead, there is noopen mI available through [facility] to my knowledge, but could you also just call the radiology dept and find out, if there is she can be referred to that, let me know. pls still fax the ativan regardless of where she is having it, let her know  called patient, left message Adella Hare LPN  November 12, 2010 8:38 AM  called patient, left message, mailed letter Adella Hare LPN  November 13, 2010 9:21 AM  Appended Document: COULD NOT DO MRI patient states she has used the ativan  for an mri before and was successful however this last time she took both and it had no effect on her at all  Appended Document: COULD NOT DO MRI I have no other options to offer at this time  Appended Document: COULD NOT DO MRI I have requested checking into an open mRI if any in the system

## 2010-11-28 NOTE — Progress Notes (Signed)
Summary: referral  Phone Note Other Incoming   Caller: dr simpson Summary of Call: pls see if the [facility] system has an open mRI available and if so order an open mRI on [name] for he back pain , same test last ordered, if there is none let her know, since she is uninsured, doing the test at an out of system facility will be at cost to her she will let you know if this is possible Initial call taken by: Syliva Overman MD,  November 19, 2010 12:12 AM  Follow-up for Phone Call        pt was called and told that [facility] didn't have open MRI. But [facility] did. Pt didn't want to do anything due to no insurance.  Follow-up by: Rudene Anda,  November 19, 2010 3:59 PM

## 2010-12-09 ENCOUNTER — Encounter (HOSPITAL_COMMUNITY): Payer: Self-pay | Admitting: Psychiatry

## 2010-12-18 NOTE — Letter (Signed)
Summary: rx savings program  rx savings program   Imported By: Lind Guest 12/12/2010 10:17:16  _____________________________________________________________________  External Attachment:    Type:   Image     Comment:   External Document

## 2011-01-01 ENCOUNTER — Encounter: Payer: Self-pay | Admitting: Family Medicine

## 2011-01-10 ENCOUNTER — Encounter: Payer: Self-pay | Admitting: Family Medicine

## 2011-01-10 LAB — URINALYSIS, ROUTINE W REFLEX MICROSCOPIC
Bilirubin Urine: NEGATIVE
Glucose, UA: NEGATIVE mg/dL
Hgb urine dipstick: NEGATIVE
Ketones, ur: NEGATIVE mg/dL
Nitrite: NEGATIVE
Protein, ur: NEGATIVE mg/dL
Specific Gravity, Urine: 1.01 (ref 1.005–1.030)
Urobilinogen, UA: 0.2 mg/dL (ref 0.0–1.0)
pH: 6.5 (ref 5.0–8.0)

## 2011-01-10 LAB — BASIC METABOLIC PANEL
BUN: 4 mg/dL — ABNORMAL LOW (ref 6–23)
BUN: 5 mg/dL — ABNORMAL LOW (ref 6–23)
CO2: 21 mEq/L (ref 19–32)
CO2: 26 mEq/L (ref 19–32)
Calcium: 8.7 mg/dL (ref 8.4–10.5)
Calcium: 8.7 mg/dL (ref 8.4–10.5)
Chloride: 110 mEq/L (ref 96–112)
Chloride: 112 mEq/L (ref 96–112)
Creatinine, Ser: 0.82 mg/dL (ref 0.4–1.2)
Creatinine, Ser: 1.02 mg/dL (ref 0.4–1.2)
GFR calc Af Amer: >60 mL/min (ref >60)
GFR calc Af Amer: >60 mL/min (ref >60)
GFR calc non Af Amer: 55 mL/min — ABNORMAL LOW (ref >60)
GFR calc non Af Amer: >60 mL/min (ref >60)
Glucose, Bld: 126 mg/dL — ABNORMAL HIGH (ref 70–99)
Glucose, Bld: 129 mg/dL — ABNORMAL HIGH (ref 70–99)
Potassium: 3.4 mEq/L — ABNORMAL LOW (ref 3.5–5.1)
Potassium: 3.7 mEq/L (ref 3.5–5.1)
Sodium: 142 mEq/L (ref 135–145)
Sodium: 143 mEq/L (ref 135–145)

## 2011-01-10 LAB — COMPREHENSIVE METABOLIC PANEL
ALT: 19 U/L (ref 0–35)
ALT: 23 U/L (ref 0–35)
AST: 20 U/L (ref 0–37)
AST: 34 U/L (ref 0–37)
Albumin: 3.2 g/dL — ABNORMAL LOW (ref 3.5–5.2)
Albumin: 3.7 g/dL (ref 3.5–5.2)
Alkaline Phosphatase: 138 U/L — ABNORMAL HIGH (ref 39–117)
Alkaline Phosphatase: 168 U/L — ABNORMAL HIGH (ref 39–117)
BUN: 6 mg/dL (ref 6–23)
BUN: 6 mg/dL (ref 6–23)
CO2: 23 mEq/L (ref 19–32)
CO2: 25 mEq/L (ref 19–32)
Calcium: 8.8 mg/dL (ref 8.4–10.5)
Calcium: 9.6 mg/dL (ref 8.4–10.5)
Chloride: 109 mEq/L (ref 96–112)
Chloride: 110 mEq/L (ref 96–112)
Creatinine, Ser: 0.92 mg/dL (ref 0.4–1.2)
Creatinine, Ser: 0.93 mg/dL (ref 0.4–1.2)
GFR calc Af Amer: >60 mL/min (ref >60)
GFR calc Af Amer: >60 mL/min (ref >60)
GFR calc non Af Amer: >60 mL/min (ref >60)
GFR calc non Af Amer: >60 mL/min (ref >60)
Glucose, Bld: 99 mg/dL (ref 70–99)
Glucose, Bld: 99 mg/dL (ref 70–99)
Potassium: 3.3 mEq/L — ABNORMAL LOW (ref 3.5–5.1)
Potassium: 3.6 mEq/L (ref 3.5–5.1)
Sodium: 141 mEq/L (ref 135–145)
Sodium: 144 mEq/L (ref 135–145)
Total Bilirubin: 0.6 mg/dL (ref 0.3–1.2)
Total Bilirubin: 0.9 mg/dL (ref 0.3–1.2)
Total Protein: 6.9 g/dL (ref 6.0–8.3)
Total Protein: 7.8 g/dL (ref 6.0–8.3)

## 2011-01-10 LAB — DIFFERENTIAL
Basophils Absolute: 0 10*3/uL (ref 0.0–0.1)
Basophils Absolute: 0 10*3/uL (ref 0.0–0.1)
Basophils Absolute: 0 10*3/uL (ref 0.0–0.1)
Basophils Absolute: 0 10*3/uL (ref 0.0–0.1)
Basophils Relative: 0 % (ref 0–1)
Basophils Relative: 0 % (ref 0–1)
Basophils Relative: 0 % (ref 0–1)
Basophils Relative: 0 % (ref 0–1)
Eosinophils Absolute: 0.1 10*3/uL (ref 0.0–0.7)
Eosinophils Absolute: 0.2 10*3/uL (ref 0.0–0.7)
Eosinophils Absolute: 0.3 10*3/uL (ref 0.0–0.7)
Eosinophils Absolute: 0.3 10*3/uL (ref 0.0–0.7)
Eosinophils Relative: 1 % (ref 0–5)
Eosinophils Relative: 2 % (ref 0–5)
Eosinophils Relative: 4 % (ref 0–5)
Eosinophils Relative: 5 % (ref 0–5)
Lymphocytes Relative: 21 % (ref 12–46)
Lymphocytes Relative: 32 % (ref 12–46)
Lymphocytes Relative: 34 % (ref 12–46)
Lymphocytes Relative: 36 % (ref 12–46)
Lymphs Abs: 1.8 10*3/uL (ref 0.7–4.0)
Lymphs Abs: 2.3 10*3/uL (ref 0.7–4.0)
Lymphs Abs: 2.3 10*3/uL (ref 0.7–4.0)
Lymphs Abs: 3.1 10*3/uL (ref 0.7–4.0)
Monocytes Absolute: 0.2 10*3/uL (ref 0.1–1.0)
Monocytes Absolute: 0.6 10*3/uL (ref 0.1–1.0)
Monocytes Absolute: 0.7 10*3/uL (ref 0.1–1.0)
Monocytes Absolute: 0.8 10*3/uL (ref 0.1–1.0)
Monocytes Relative: 10 % (ref 3–12)
Monocytes Relative: 3 % (ref 3–12)
Monocytes Relative: 7 % (ref 3–12)
Monocytes Relative: 9 % (ref 3–12)
Neutro Abs: 3.9 10*3/uL (ref 1.7–7.7)
Neutro Abs: 4 10*3/uL (ref 1.7–7.7)
Neutro Abs: 4.5 10*3/uL (ref 1.7–7.7)
Neutro Abs: 6.5 10*3/uL (ref 1.7–7.7)
Neutrophils Relative %: 52 % (ref 43–77)
Neutrophils Relative %: 53 % (ref 43–77)
Neutrophils Relative %: 55 % (ref 43–77)
Neutrophils Relative %: 75 % (ref 43–77)

## 2011-01-10 LAB — CBC
HCT: 30.4 % — ABNORMAL LOW (ref 36.0–46.0)
HCT: 32.7 % — ABNORMAL LOW (ref 36.0–46.0)
HCT: 33.9 % — ABNORMAL LOW (ref 36.0–46.0)
HCT: 38 % (ref 36.0–46.0)
Hemoglobin: 10.7 g/dL — ABNORMAL LOW (ref 12.0–15.0)
Hemoglobin: 11.1 g/dL — ABNORMAL LOW (ref 12.0–15.0)
Hemoglobin: 12.5 g/dL (ref 12.0–15.0)
Hemoglobin: 9.9 g/dL — ABNORMAL LOW (ref 12.0–15.0)
MCH: 28.1 pg (ref 26.0–34.0)
MCH: 28.5 pg (ref 26.0–34.0)
MCH: 28.5 pg (ref 26.0–34.0)
MCH: 28.6 pg (ref 26.0–34.0)
MCHC: 32.6 g/dL (ref 30.0–36.0)
MCHC: 32.7 g/dL (ref 30.0–36.0)
MCHC: 32.8 g/dL (ref 30.0–36.0)
MCHC: 32.8 g/dL (ref 30.0–36.0)
MCV: 86.1 fL (ref 78.0–100.0)
MCV: 86.9 fL (ref 78.0–100.0)
MCV: 87.4 fL (ref 78.0–100.0)
MCV: 87.4 fL (ref 78.0–100.0)
Platelets: 264 10*3/uL (ref 150–400)
Platelets: 305 10*3/uL (ref 150–400)
Platelets: 306 10*3/uL (ref 150–400)
Platelets: 323 10*3/uL (ref 150–400)
RBC: 3.48 MIL/uL — ABNORMAL LOW (ref 3.87–5.11)
RBC: 3.79 MIL/uL — ABNORMAL LOW (ref 3.87–5.11)
RBC: 3.88 MIL/uL (ref 3.87–5.11)
RBC: 4.37 MIL/uL (ref 3.87–5.11)
RDW: 15.2 % (ref 11.5–15.5)
RDW: 15.3 % (ref 11.5–15.5)
RDW: 15.4 % (ref 11.5–15.5)
RDW: 15.6 % — ABNORMAL HIGH (ref 11.5–15.5)
WBC: 7.1 10*3/uL (ref 4.0–10.5)
WBC: 7.6 10*3/uL (ref 4.0–10.5)
WBC: 8.5 10*3/uL (ref 4.0–10.5)
WBC: 8.7 10*3/uL (ref 4.0–10.5)

## 2011-01-10 LAB — CULTURE, ROUTINE-ABSCESS

## 2011-01-10 LAB — CULTURE, BLOOD (ROUTINE X 2)
Culture: NO GROWTH
Culture: NO GROWTH
Report Status: 8122011
Report Status: 8122011

## 2011-01-10 LAB — CK TOTAL AND CKMB (NOT AT ARMC)
CK, MB: 2.1 ng/mL (ref 0.3–4.0)
Relative Index: INVALID (ref 0.0–2.5)
Total CK: 86 U/L (ref 7–177)

## 2011-01-10 LAB — LIPID PANEL
Cholesterol: 112 mg/dL (ref 0–200)
HDL: 36 mg/dL — ABNORMAL LOW (ref >39)
LDL Cholesterol: 56 mg/dL (ref 0–99)
Total CHOL/HDL Ratio: 3.1 RATIO
Triglycerides: 98 mg/dL (ref <150)
VLDL: 20 mg/dL (ref 0–40)

## 2011-01-10 LAB — CROSSMATCH
ABO/RH(D): A POS
Antibody Screen: NEGATIVE

## 2011-01-10 LAB — ABO/RH: ABO/RH(D): A POS

## 2011-01-10 LAB — FERRITIN: Ferritin: 161 ng/mL (ref 10–291)

## 2011-01-10 LAB — VANCOMYCIN, TROUGH: Vancomycin Tr: 20.3 ug/mL — ABNORMAL HIGH (ref 10.0–20.0)

## 2011-01-10 LAB — T4, FREE: Free T4: 1.37 ng/dL (ref 0.80–1.80)

## 2011-01-10 LAB — TSH: TSH: 1.088 u[IU]/mL (ref 0.350–4.500)

## 2011-01-10 LAB — IRON: Iron: 38 ug/dL — ABNORMAL LOW (ref 42–135)

## 2011-01-14 ENCOUNTER — Encounter: Payer: Self-pay | Admitting: Family Medicine

## 2011-01-14 NOTE — Letter (Signed)
Summary: medical release  medical release   Imported By: Lind Guest 01/10/2011 11:04:52  _____________________________________________________________________  External Attachment:    Type:   Image     Comment:   External Document

## 2011-01-16 ENCOUNTER — Ambulatory Visit: Payer: Self-pay | Admitting: Family Medicine

## 2011-01-23 ENCOUNTER — Encounter (INDEPENDENT_AMBULATORY_CARE_PROVIDER_SITE_OTHER): Payer: Self-pay | Admitting: Psychiatry

## 2011-01-23 DIAGNOSIS — F329 Major depressive disorder, single episode, unspecified: Secondary | ICD-10-CM

## 2011-01-26 ENCOUNTER — Other Ambulatory Visit: Payer: Self-pay | Admitting: Family Medicine

## 2011-02-02 LAB — URINALYSIS, ROUTINE W REFLEX MICROSCOPIC
Bilirubin Urine: NEGATIVE
Glucose, UA: NEGATIVE mg/dL
Hgb urine dipstick: NEGATIVE
Specific Gravity, Urine: >1.03 — ABNORMAL HIGH (ref 1.005–1.030)

## 2011-02-02 LAB — RAPID URINE DRUG SCREEN, HOSP PERFORMED
Amphetamines: NOT DETECTED
Barbiturates: NOT DETECTED
Benzodiazepines: POSITIVE — AB
Cocaine: NOT DETECTED
Opiates: POSITIVE — AB
Tetrahydrocannabinol: NOT DETECTED

## 2011-02-02 LAB — BASIC METABOLIC PANEL
Calcium: 9.7 mg/dL (ref 8.4–10.5)
Creatinine, Ser: 0.95 mg/dL (ref 0.4–1.2)
GFR calc Af Amer: >60 mL/min (ref >60)

## 2011-02-02 LAB — ETHANOL: Alcohol, Ethyl (B): <5 mg/dL (ref 0–10)

## 2011-02-02 LAB — CBC
RBC: 5.03 MIL/uL (ref 3.87–5.11)
WBC: 5.7 10*3/uL (ref 4.0–10.5)

## 2011-02-02 LAB — DIFFERENTIAL
Lymphs Abs: 2.8 10*3/uL (ref 0.7–4.0)
Monocytes Relative: 6 % (ref 3–12)
Neutro Abs: 2.2 10*3/uL (ref 1.7–7.7)
Neutrophils Relative %: 39 % — ABNORMAL LOW (ref 43–77)

## 2011-02-10 ENCOUNTER — Encounter (INDEPENDENT_AMBULATORY_CARE_PROVIDER_SITE_OTHER): Payer: Self-pay | Admitting: Psychiatry

## 2011-02-10 DIAGNOSIS — F329 Major depressive disorder, single episode, unspecified: Secondary | ICD-10-CM

## 2011-02-18 ENCOUNTER — Encounter: Payer: Self-pay | Admitting: Family Medicine

## 2011-02-19 ENCOUNTER — Encounter: Payer: Self-pay | Admitting: Family Medicine

## 2011-02-20 ENCOUNTER — Encounter: Payer: Self-pay | Admitting: Family Medicine

## 2011-02-20 ENCOUNTER — Ambulatory Visit (INDEPENDENT_AMBULATORY_CARE_PROVIDER_SITE_OTHER): Payer: Self-pay | Admitting: Family Medicine

## 2011-02-20 VITALS — BP 124/80 | HR 52 | Resp 16 | Ht 63.5 in | Wt 172.0 lb

## 2011-02-20 DIAGNOSIS — F329 Major depressive disorder, single episode, unspecified: Secondary | ICD-10-CM

## 2011-02-20 DIAGNOSIS — F17200 Nicotine dependence, unspecified, uncomplicated: Secondary | ICD-10-CM

## 2011-02-20 DIAGNOSIS — B379 Candidiasis, unspecified: Secondary | ICD-10-CM

## 2011-02-20 DIAGNOSIS — IMO0002 Reserved for concepts with insufficient information to code with codable children: Secondary | ICD-10-CM

## 2011-02-20 DIAGNOSIS — J449 Chronic obstructive pulmonary disease, unspecified: Secondary | ICD-10-CM

## 2011-02-20 DIAGNOSIS — R319 Hematuria, unspecified: Secondary | ICD-10-CM

## 2011-02-20 DIAGNOSIS — R5381 Other malaise: Secondary | ICD-10-CM

## 2011-02-20 DIAGNOSIS — R7301 Impaired fasting glucose: Secondary | ICD-10-CM

## 2011-02-20 DIAGNOSIS — I10 Essential (primary) hypertension: Secondary | ICD-10-CM

## 2011-02-20 DIAGNOSIS — B356 Tinea cruris: Secondary | ICD-10-CM

## 2011-02-20 DIAGNOSIS — R5383 Other fatigue: Secondary | ICD-10-CM

## 2011-02-20 DIAGNOSIS — E785 Hyperlipidemia, unspecified: Secondary | ICD-10-CM

## 2011-02-20 DIAGNOSIS — N760 Acute vaginitis: Secondary | ICD-10-CM

## 2011-02-20 DIAGNOSIS — F3289 Other specified depressive episodes: Secondary | ICD-10-CM

## 2011-02-20 DIAGNOSIS — N3000 Acute cystitis without hematuria: Secondary | ICD-10-CM

## 2011-02-20 DIAGNOSIS — Z79899 Other long term (current) drug therapy: Secondary | ICD-10-CM

## 2011-02-20 MED ORDER — HYDROCODONE-ACETAMINOPHEN 10-660 MG PO TABS: 1.0000 | ORAL_TABLET | Freq: Two times a day (BID) | ORAL | Status: DC

## 2011-02-20 MED ORDER — NICOTINE 14 MG/24HR TD PT24: 1.0000 | MEDICATED_PATCH | TRANSDERMAL | Status: DC

## 2011-02-20 MED ORDER — NICOTINE 7 MG/24HR TD PT24: 1.0000 | MEDICATED_PATCH | TRANSDERMAL | Status: AC

## 2011-02-20 MED ORDER — NYSTATIN 100000 UNIT/GM EX POWD: CUTANEOUS | Status: DC

## 2011-02-20 MED ORDER — ALPRAZOLAM 0.5 MG PO TABS: 0.5000 mg | ORAL_TABLET | Freq: Three times a day (TID) | ORAL | Status: DC

## 2011-02-20 NOTE — Progress Notes (Signed)
  Subjective:    Patient ID: [name], female    DOB: Aug 29, 1951, 60 y.o.   MRN: [id number]  HPI 3 month h/o puritic rash on lower abdomen, and in thighs Urinary frequency and burning, feels thirsty all the tiime and  Craves sweet drinks. Wants to quit smoking. Purulent d/c from clitoris for months, does not want pelvic exam. Maintained on current antidepressants and doing well     Review of Systems Denies recent fever or chills.c/o fatigue Denies sinus pressure, nasal congestion, ear pain or sore throat. Denies chest congestion, productive cough or wheezing. Denies chest pains, palpitations, paroxysmal nocturnal dyspnea, orthopnea and leg swelling Denies abdominal pain, nausea, vomiting,diarrhea or constipation.   Chronic back pain unchanged Denies headaches, seizure, numbness, or tingling. Denies uncontrolled depression, anxiety or insomnia.         Objective:   Physical Exam Patient alert and oriented and in no Cardiopulmonary distress.  HEENT: No facial asymmetry, EOMI, no sinus tenderness, TM's clear, Oropharynx pink and moist.  Neck supple no adenopathy.  Chest: Clear to auscultation bilaterally.decreased air entry bilaterally, no wheezes  CVS: S1, S2 no murmurs, no S3.  ABD: Sof, obese, non tender. Bowel sounds normal.  Ext: No edema  MS: decreased  ROM spine,adequate in  shoulders, hips and knees.  Skin: Intact, fungal infection in abdominal fold  Psych: Good eye contact, normal affect. Memory intact not anxious or depressed appearing.  CNS: CN 2-12 intact, power, tone and sensation normal throughout.        Assessment & Plan:

## 2011-02-21 ENCOUNTER — Other Ambulatory Visit: Payer: Self-pay | Admitting: Family Medicine

## 2011-02-21 LAB — URINALYSIS, ROUTINE W REFLEX MICROSCOPIC
Glucose, UA: NEGATIVE mg/dL
Leukocytes, UA: NEGATIVE
Protein, ur: 100 mg/dL — AB
pH: 5.5 (ref 5.0–8.0)

## 2011-02-21 LAB — URINALYSIS, MICROSCOPIC ONLY: Casts: NONE SEEN

## 2011-02-21 LAB — POCT URINALYSIS DIPSTICK
Leukocytes, UA: NEGATIVE
Nitrite, UA: NEGATIVE
pH, UA: 5.5

## 2011-02-22 LAB — URINALYSIS, MICROSCOPIC ONLY: Crystals: NONE SEEN

## 2011-02-22 LAB — URINALYSIS, ROUTINE W REFLEX MICROSCOPIC
Leukocytes, UA: NEGATIVE
Nitrite: NEGATIVE
Specific Gravity, Urine: 1.02 (ref 1.005–1.030)
Urobilinogen, UA: 1 mg/dL (ref 0.0–1.0)

## 2011-02-25 ENCOUNTER — Telehealth: Payer: Self-pay | Admitting: *Deleted

## 2011-02-25 DIAGNOSIS — N3000 Acute cystitis without hematuria: Secondary | ICD-10-CM

## 2011-02-25 NOTE — Telephone Encounter (Signed)
Patient submitted urine for culture

## 2011-02-27 LAB — URINE CULTURE: Colony Count: 10000

## 2011-03-03 ENCOUNTER — Encounter (HOSPITAL_COMMUNITY): Payer: Self-pay | Admitting: Psychiatry

## 2011-03-03 MED ORDER — FLUCONAZOLE 150 MG PO TABS: 150.0000 mg | ORAL_TABLET | Freq: Once | ORAL | Status: AC

## 2011-03-03 NOTE — Telephone Encounter (Signed)
Med sent and patient aware 

## 2011-03-03 NOTE — Telephone Encounter (Signed)
Called patient, left message.

## 2011-03-03 NOTE — Telephone Encounter (Signed)
Only yeast grew, pls let her know and erx fluconazole 150mg   #1 tab only

## 2011-03-03 NOTE — Telephone Encounter (Signed)
Addended by: Adella Hare on: 03/03/2011 03:11 PM   Modules accepted: Orders

## 2011-03-05 ENCOUNTER — Encounter: Payer: Self-pay | Admitting: Family Medicine

## 2011-03-05 DIAGNOSIS — J449 Chronic obstructive pulmonary disease, unspecified: Secondary | ICD-10-CM | POA: Insufficient documentation

## 2011-03-05 NOTE — Assessment & Plan Note (Signed)
Controlled, no change in medication  

## 2011-03-05 NOTE — Assessment & Plan Note (Signed)
Will check [facility]

## 2011-03-05 NOTE — Assessment & Plan Note (Signed)
ccua checke d and specimen sent for c/s , will hold on result to treat

## 2011-03-05 NOTE — Assessment & Plan Note (Signed)
Flare, medication prescribed and pt counseled to keep area as dry as possible

## 2011-03-05 NOTE — Assessment & Plan Note (Signed)
Will check HBA1C , pt advised to change diet to prevent development of diabetes

## 2011-03-05 NOTE — Assessment & Plan Note (Signed)
Unchanged, continue current med 

## 2011-03-05 NOTE — Patient Instructions (Signed)
F/u in 5 months.  Please think about quitting smoking.  This is very important for your health.  Consider setting a quit date, then cutting back or switching brands to prepare to stop.  Also think of the money you will save every day by not smoking.  Quick Tips to Quit Smoking: Fix a date i.e. keep a date in mind from when you would not touch a tobacco product to smoke  Keep yourself busy and block your mind with work loads or reading books or watching movies in malls where smoking is not allowed  Vanish off the things which reminds you about smoking for example match box, or your favorite lighter, or the pipe you used for smoking, or your favorite jeans and shirt with which you used to enjoy smoking, or the club where you used to do smoking  Try to avoid certain people places and incidences where and with whom smoking is a common factor to add on  Praise yourself with some token gifts from the money you saved by stopping smoking  Anti Smoking teams are there to help you. Join their programs  Anti-smoking Gums are there in many medical shops. Try them to quit smoking   Side-effects of Smoking: Disease caused by smoking cigarettes are emphysema, bronchitis, heart failures  Premature death  Cancer is the major side effect of smoking  Heart attacks and strokes are the quick effects of smoking causing sudden death  Some smokers lives end up with limbs amputated  Breathing problem or fast breathing is another side effect of smoking  Due to more intakes of smokes, carbon mono-oxide goes into your brain and other muscles of the body which leads to swelling of the veins and blockage to the air passage to lungs  Carbon monoxide blocks blood vessels which leads to blockage in the flow of blood to different major body organs like heart lungs and thus leads to attacks and deaths  During pregnancy smoking is very harmful and leads to premature birth of the infant, spontaneous abortions, low weight of the  infant during birth  Fat depositions to narrow and blocked blood vessels causing heart attacks  In many cases cigarette smoking caused infertility in men

## 2011-03-05 NOTE — Assessment & Plan Note (Signed)
Uncontrolled when last checked, needs rept labs

## 2011-03-07 ENCOUNTER — Other Ambulatory Visit: Payer: Self-pay | Admitting: Family Medicine

## 2011-03-11 NOTE — Op Note (Signed)
[name], [name]          ACCOUNT NO.:  [id number]   MEDICAL RECORD NO.:  [id number]          PATIENT TYPE:  AMB   LOCATION:  DSC                          FACILITY:  [facility]   PHYSICIAN:  [name], M.D.     DATE OF BIRTH:  1951-03-22   DATE OF PROCEDURE:  06/01/2007  DATE OF DISCHARGE:                               OPERATIVE REPORT   PREOPERATIVE DIAGNOSIS:  1. Right hindfoot malalignment.  2. Right tight gastroc.  3. Right tarsal tunnel syndrome.  4. Right posterior tibial tendon tear.   POSTOPERATIVE DIAGNOSIS:  1. Right hindfoot malalignment.  2. Right tight gastroc.  3. Right tarsal tunnel syndrome.  4. Right posterior tibial tendon tear.   OPERATION:  1. Right medializing calcaneal osteotomy.  2. Right FDL to navicular tendon transfer.  3. Right gastroc slide.  4. Right tarsal tunnel release.  5. Right posterior tibial tendon tear repair.  6. Stress x-rays.   ANESTHESIA:  General.   SURGEON:  [name], M.D.   ASSISTANT:  [name], P.A.-C.   ESTIMATED BLOOD LOSS:  Minimal.   TOURNIQUET TIME:  Approximately an hour.   COMPLICATIONS:  None.   DISPOSITION:  Stable to the PR.   INDICATIONS:  This is a 60 year old female who has had long standing  posteromedial right ankle pain with known documented tarsal tunnel  syndrome that is interfering with her life to the point she cannot do  what she wants to do.  She consented to the above procedure.  All risks  including infection, neurovascular injury, persistent pain, worsening  pain, prolonged recovery, stiffness, arthritis, nonunion, malunion,  hardware rotation, hardware failure, and possibility of hardware  removal, were all explained, questions were encouraged and answered.   DESCRIPTION OF PROCEDURE:  The patient was brought to the operating room  and placed in a supine position. Initially, after adequate general  endotracheal tube anesthesia was administered with block as well as  Ancef 1  gram IV piggyback, the patient was then placed with a bump  underneath the right ipsilateral hip.  All bony prominences were well  padded.  The right lower extremity was prepped and draped in a sterile  manner over a proximally thigh tourniquet.   We started procedure with a longitudinal incision over the medial aspect  of the gastrocnemius muscle tendinous junction.  Dissection was carried  down through the skin and hemostasis was obtained.  The fascia was  opened in line with the incision.  The conjoined region was then  developed between gastroc and soleus.  Soft tissue was then elevated off  the posterior aspect of the gastrocnemius.  The sural nerve was  identified and protected posteriorly.  The gastrocnemius was then  released with curved Mayo scissors.  This had an excellent release tight  gastroc.  The area was copiously irrigated with normal saline.  The  subcu was closed with 3-0 Vicryl, the skin was closed with 4-0 Monocryl  subcuticular stitch.  Steri-Strips were applied.   We then gravity exsanguinated the right lower extremity and the  tourniquet was elevated to 290 mmHg.  A curvilinear incision was made  over the posteromedial aspect of the right ankle and foot.  Dissection  was carried down through the skin.  Hemostasis was obtained.  A formal  tarsal tunnel release was then performed.  The flexor retinaculum was  then incised and the tarsal tunnel was released.  We then made a  separate incision over the flexor retinaculum of the posterior tibial  tendon.  Dissection was carried down to the tendon.  There was a  tremendous amount of synovitis in this area.  Once the tendon was  adequately dissected out, this was then delivered out and there was a  large approximately 3-4 cm tear from the anterior portion of the  posterior tibial tendon. This was then debrided sharply and then  repaired with 5-0 Mersilene stitch.  Once this was repaired with the  cannulated technique,  this had an outstanding repair. We then copiously  irrigated the area with normal saline.   We then identified the FDL tendon and traced this to the knot of Henry  and tenotomized the tendon as distal as possible.  Once the tendon was  tenotomized, we then made drill holes sequentially with 3.5, 4.5, and  then 6.0 drill holes into the medial aspect of the navicular tuberosity.  Then, using a Swanson suture passer and 2-0 FiberWire stitch, the FDL  tendon was pulled from plantar to dorsal through the drill hole  transferring the FDL to the navicular.  Then, the tendon was sewn to the  local periosteum and posterior tibial tendon with 2-0 FiberWire  stitches.  This had an outstanding reconstruction. The area was  copiously irrigated with normal saline.  The entire retinaculum was then  repaired with 2-0 Vicryl stitch.  This had an Conservation officer, historic buildings.  The  tourniquet was deflated and hemostasis was obtained.   Radiolucent triangles were then placed and a longitudinal incision was  then made over the lateral aspect of the calcaneal tuber in line with  the calcaneal pitch.  Dissection was carried down directly to bone.  Soft tissue was elevated superiorly, inferiorly, as well as laterally.  Homan retractors were placed.  Then, with an oscillating saw, an  osteotomy was then made.  The osteotomy was then finished with a  straight osteotome and then the osteotomy was spread apart using a large  laminar spreader.  We then translated approximately 1 cm medially and  provisionally fixed this with a 2 mm K-wire.  A longitudinal incision  was then made at the midline on the plantar posterior aspect of the  heel. Dissection was carried down directly to bone.  Two 6.5 mm 16 mm  threaded 55 mm long cancellous screws were then placed parallel to one  another using 4.5 and 3.2 mm drill holes respectively.  This had an  excellent purchase and maintenance of the correction.  The K-wire was  removed.   Final stress x-rays were obtained in the lateral and axial  views and showed no gross motion across the osteotomy site, fixation  proposition, and excellent alignment, as well.   After stress x-rays were obtained, the areas were copiously irrigated  with normal saline.  The subcu was closed with 0 Vicryl, the skin was  closed with 4-0 nylon over all wounds.  A sterile dressing was applied.  A modified Jones dressing was applied with the ankle in neutral  dorsiflexion.  The patient was stable to the PR.      [name], M.D.  Electronically Signed     PB/MEDQ  D:  06/01/2007  T:  06/01/2007  Job:  [id number]

## 2011-03-11 NOTE — Assessment & Plan Note (Signed)
NAMEKAJAH, SANTIZO           CHART#:  [id number]   DATE:  04/01/2007                       DOB:  10/01/1951   PRESENTING COMPLAINT:  Epigastric pain.   SUBJECTIVE:  [name] is a 60 year old African-American female patient  of Dr. Lodema Hong who presents with epigastric pain.  She has had this pain  off-and-on for several months.  In February 2007, she had an EGD, which  revealed antral scar and small sliding hiatal hernia with mild changes  of reflux esophagitis limited to the GE junction.  She had H. pylori  serology, which was negative.  She remains on her PPI.  She states  Nexium has not helped.  She is presently on her daughters Prilosec.  She  has taken omeprazole 20 mg, which has not helped.  Pain is worse after  meals.  She denies nausea, vomiting, melena or rectal bleeding.  She  states that her appetite is poor; however, she has gained 15 pounds  since her last visit in November of 2007.  She is on Mobic for  arthralgias.   [name] also had colonoscopy in February 2007 because of diarrhea and  a 30-pound weight loss.  She had a large sessile polyp snared from her  ascending colon.  We recommended followup to make sure that polypectomy  was complete.  This was scheduled in April, but had to be postponed  because of family reasons.  She states that she has lost her mother,  another aunt, and her uncle this year.  She has been under a lot of  stress.   MEDICATIONS:  1. She is on Singulair 10 mg daily.  2. Tylenol ES up to 4 tablets daily.  3. Dicyclomine 10 mg t.i.d.  4. Klor-Con 10 mEq b.i.d.  5. Norvasc 5 mg b.i.d.  6. HCTZ 12.5 mg daily.  7. Wellbutrin 300 mg daily.  8. Ambien CR 12.5 mg q.h.s. p.r.n.  9. Mobic 7.5 mg daily.   She has had a cholecystectomy.  She is status post laparotomy for  perforated peptic ulcer back in 1991.   OBJECTIVE:  VITAL SIGNS:  Weight 161 pounds.  She is 5 feet 3-1/2 inches  tall.  Pulse 80 per minute, blood pressure 132/80,  temperature is 98.  HEENT:  Conjunctivae are pink.  Sclerae is nonicteric.  Oropharyngeal  mucosa is normal.  NECK:  No neck masses or thyromegaly noted.  ABDOMEN:  Full.  She has some asymmetry to her upper abdomen related to  scarring.  Abdomen is soft.  She has mild-to-moderate tenderness in the  epigastrium to the right of the midline.  No hepatomegaly or  splenomegaly noted.  EXTREMITIES:  She has no peripheral edema or clubbing.   ASSESSMENT:  Recurrent epigastric pain.  She has a history of  complicated peptic ulcer disease.  Her H. pylori serology last year was  negative and EGD was negative for active disease.  She is on Mobic;  therefore I am concerned that she may have a recurrent ulcer.  It is  interesting to note that the patient complains of anorexia, but she is  gained 15 pounds since November 2007 visit.   PLAN:  1. Patient advised to discontinue Mobic.  She will check with an      orthopedic surgeon, bloated eruptions.  2. We start her on omeprazole to 40 mg  q.a.m or 20 mg b.i.d.  A      prescription given for three months with a refill.  The patient      will call us with a progress report in two weeks.  If she does not      improve with the therapy, will consider a CBC, LFTs, amylase and      abdominal CT prior to considering EGD.       Lionel December, M.D.  Electronically Signed     NR/MEDQ  D:  04/01/2007  T:  04/02/2007  Job:  [id number]   cc:   Milus Mallick. Lodema Hong, M.D.

## 2011-03-14 NOTE — H&P (Signed)
[name], [name]          ACCOUNT NO.:  [id number]   MEDICAL RECORD NO.:  [id number]          PATIENT TYPE:  INP   LOCATION:  1845                         FACILITY:  [facility]   PHYSICIAN:  Jonna L. Robb Matar, M.D.DATE OF BIRTH:  Aug 15, 1951   DATE OF ADMISSION:  03/18/2006  DATE OF DISCHARGE:                                HISTORY & PHYSICAL   CHIEF COMPLAINT:  Syncope.   HISTORY:  This 60 year old African-American female was upstairs visiting her  mother who has been having bradycardia problems and is looking at getting a  pacemaker.  The patient herself has not been feeling that good for the last  couple of days, some low-grade nausea, diarrhea, vertigo and while she was  visiting her mother she passed out.  No seizure activity was found.  She had  no incontinence.  The vertigo that she is complaining of is similar to  previous episodes of inner ear disease that she has had.   PAST MEDICAL HISTORY:  1.  Chronic asthma.  2.  Hypertension.  She says the Inderal that she is on is for hypertension      and not for migraine control.  3.  Depression and insomnia.  4.  GERD.  She has a previous history of a having a perforated ulcer in      1991.  5.  Colon polyp.   OPERATIONS:  Tubal ligation, cholecystectomy, incisional hernia repair,  breast biopsy and left oophorectomy.   FAMILY HISTORY:  Her mother has lupus and her father died of unknown causes.   SOCIAL HISTORY:  Nondrinker, no drugs, smokes a half a pack a day for 36  years, is married, three children.   ALLERGIES:  PENICILLIN GIVES HER NAUSEA.   MEDICATIONS:  This is not a complete list as there is a few at home that she  just cannot remember.  Her family has said that they will bring them in  tomorrow.  We do know that she is on Inderal 80, Lexapro 20, Maxalt,  Singulair 10, albuterol two puffs t.i.d. and p.r.n.   REVIEW OF SYSTEMS:  Twelve-system review is done.  The patient is  complaining of a low-grade  headache right now secondary to not eating.  She  had been having problems with her right leg and has gotten three cortisone  injections so far, one in the right knee and one in the right ankle and one  in the foot.  She is having some mild shortness of breath according to the  daughter who also says her mother is reluctant to disclose her symptoms.   PHYSICAL EXAMINATION:  Temperature 98.1, pulse is high as 64 and as low as  46, respirations are 20, blood pressure 147/85, 97% on room air.  Heart has  a normal S1 and S2, the rate is slow, there are no bruits, cyanosis,  clubbing or edema.  Breasts show no masses, tenderness, or discharge.  Abdomen is nontender, normal bowel sounds, no hepatosplenomegaly.  No  cervical adenopathy.  Muscle strength is 5/5 with full range of motion all  four extremities.  Skin shows no rash, lesion, or  nodules.  Cranial nerves  are intact.  DTRs are 2+.  Sensation is normal.  She is alert and oriented  x3.  Normal memory, judgment and affect.   INITIAL LABORATORY WORK:  EKG showed sinus bradycardia, potassium is 3.3,  BUN 7, creatinine 1.1, normal CBC, urinalysis, alcohol less than 5.   IMPRESSION:  1.  Syncope.  This could be multifactorial.  2.  Bradycardia.  I am going to change her beta blocker to Norvasc which has      also been shown to help migraine and will not cause bradycardia.  It is      interesting that she also has another family member that has bradycardia      issues.  I will check the patient for lupus as well and hypothyroidism.  3.  Chronic asthma.  We will continue her Singulair and nebulizer and      perhaps getting her off beta blocker might help as well.  4.  Hypokalemia.  This will be replaced.  I will check her stool, her      Clostridium difficile and culture since she is obviously having enough      diarrhea to lose potassium.  We will also check her magnesium level.  5.  Vertigo.  I will put her on some meclizine.  6.   Depression.  7.  Gastroesophageal reflux disease.  8.  Hypertension.      Jonna L. Robb Matar, M.D.  Electronically Signed     JLB/MEDQ  D:  03/18/2006  T:  03/18/2006  Job:  [id number]

## 2011-03-14 NOTE — Consult Note (Signed)
NAMEMALYNA, BUDNEY NO.:  [id number]   MEDICAL RECORD NO.:  [id number]           PATIENT TYPE:   LOCATION:                                 FACILITY:   PHYSICIAN:  Lionel December, M.D.    DATE OF BIRTH:  07-17-1951   DATE OF CONSULTATION:  10/22/2005  DATE OF DISCHARGE:                                   CONSULTATION   PRIMARY CARE PHYSICIAN:  Dr. Lodema Hong.   REFERRING PHYSICIAN:  Dr. Malvin Johns.   REASON FOR CONSULTATION:  Postprandial abdominal pain and diarrhea.   HISTORY OF PRESENT ILLNESS:  [name] is a 60 year old African-American  female who reports over the last four months she has had significant amount  of postprandial urgency and watery diarrhea. She generally has anywhere  between two and six loose stools after each meal. She does note, however,  over the last four weeks or so her diarrhea has slacked off some, and she is  not having it on a daily basis like she was. She is having about two days'  worth of diarrhea where she has 10 to 12+ stools a day recently. She denies  any rectal bleeding, melena, or mucus in her stools. She continues to  complain of daily cramping type abdominal pain which is constant, is made  worse by eating. Most of her pain is to her epigastrium and mid abdomen. She  also has a history of chronic GERD. She is on Nexium. She has breakthrough  heartburn and indigestion, mostly at night. This occasionally awakens her  from sleeping. She denies any dysphagia or odynophagia. She denies any  history of constipation. Prior to four months ago, she had a daily soft  brown bowel movement. She also complains of intermittent nausea and vomiting  about once a month. She denies any recent foreign travel. She was treated  with an antibiotic back in August for a urinary tract infection.   PAST MEDICAL HISTORY:  1.  Hypertension.  2.  Asthma.  3.  Depression with history of nervous breakdown.  4.  Chronic GERD for the last three  years.  5.  Significant peptic ulcer disease with perforation back in 1991.  6.  Tubal ligation.  7.  Cholecystectomy.  8.  Incision hernia repair.  9.  Two benign breast biopsies.  10. History with left oophorectomy.   CURRENT MEDICATIONS:  1.  Women's One a Day multivitamin.  2.  Singulair 10 mg daily.  3.  Benicar HCT 10 mg daily.  4.  Nexium 40 mg daily.  5.  Inderal LA 80 mg daily.  6.  Ambien 10 mg at bedtime.  7.  Tylenol ES 4 p.o. daily for her arthritic pain.  8.  Maxalt 50 mg daily p.r.n.  9.  Gabapentin 100 mg t.i.d.  10. One Source Complete daily supplement.  11. Lexapro 10 mg daily.  12. Prilosec 20 mg daily OTC when she runs out of her Nexium.  13. Geritol Complete Stress Tabs once daily.   ALLERGIES:  PENICILLIN which causes nausea.   FAMILY HISTORY:  There is no known family history of colorectal carcinoma,  Crohn's disease, ulcerative colitis or chronic GI problems. Mother age 71  has history of SLE. Father is deceased due to unknown etiology. She has two  siblings who are relatively healthy.   SOCIAL HISTORY:  [name] has been married for 15 years. She has three  grown healthy children. She is employed full time with [employer]. She reports  a 35-year history of smoking about a half a pack a day. Denies any alcohol  or drug use.   REVIEW OF SYSTEMS:  CONSTITUTIONAL:  She reports a 5-pound weight loss. She  does report low grade fever as well as some night sweats and chills.  CARDIOVASCULAR:  Denies any chest pain or palpitations. PULMONARY:  Denies  any shortness of breath, dyspnea, cough or hemoptysis. GASTROINTESTINAL:  See HPI. MUSCULOSKELETAL:  She does have pretty significant joint pains  which she is being followed by Dr. Lodema Hong for and undergoing a workup for  SLE at this time.   OBJECTIVE:  VITAL SIGNS:  Weight 142 pounds, height 63-1/2 inches,  temperature 97.8, blood pressure 142/88, pulse 56.  GENERAL:  [name] is a 60 year old  African-American female who is  alert, oriented, pleasant, and cooperative in no acute distress.  HEENT:  Sclerae are clear and nonicteric. Conjunctivae are pink. Oropharynx  pink and moist without lesions.  NECK:  Supple without any mass or thyromegaly.  CHEST:  Heart regular rate and rhythm with normal S1 and S2 without murmurs,  rubs, clicks or gallops.  LUNGS:  Clear to auscultation bilaterally.  ABDOMEN:  Positive bowel sounds x4. No bruits auscultated. She does have  mild striae, multiple well healed incisional scars. She has an incisional  hernia to the epigastrium that is nontender and not incarcerated. Abdomen is  soft, nontender without any palpable mass or hepatosplenomegaly. No rebound  tenderness or guarding.  EXTREMITIES:  Without edema or clubbing bilaterally.  SKIN:  Brown, warm and dry without any rash or jaundice.   IMPRESSION:  [name] is a 60 year old African-American female with a  four-month history of chronic large volume watery diarrhea. More recently  over the last four weeks, her symptoms have decreased in frequency. She is  having episodes a couple of days a week instead of on a daily basis as she  has for the previous three months. She continues to have cramping type  abdominal pain to her epigastrium and mid abdomen. She also has history of  chronic GERD with some refractory night time symptoms. She is going to need  further evaluation to rule out inflammatory bowel disease. I feel she may  have IBS although it is odd that she would develop this at this age. Also  she needs further evaluation to rule out colorectal carcinoma as well. As  far as her chronic GERD is concerned, she is going to need EGD to rule out  complicated disease, development of Barrett's esophagus, etc.   PLAN:  1.  Prescription was given for Levsin sublingual one p.o. a.c. and h.s.      p.r.n. abdominal cramping and diarrhea, #60 with one refill. 2.  Will schedule colonoscopy and  EGD with Dr. Karilyn Cota. I have discussed both      procedures including risks and benefits which include but are not      limited to bleeding, infection, perforation and drug reaction. She      agrees with the plan and consent will be obtained.  3.  She can continue Nexium 40 mg daily for now.  4.  Dr. Lodema Hong has already ordered a TSH which she will follow up on. She      is also going to need a CBC, sed rate, MET7 and LFTs.  5.  Further recommendations pending procedure.   We would like to Dr. Malvin Johns for allowing [place] to participate in the care of  [name].      Nicholas Lose, N.P.      Lionel December, M.D.     KC/MEDQ  D:  10/22/2005  T:  10/22/2005  Job:  [id number]

## 2011-03-14 NOTE — Discharge Summary (Signed)
NAMEMERIS, REEDE NO.:  [id number]   MEDICAL RECORD NO.:  [id number]          PATIENT TYPE:  INP   LOCATION:  4729                         FACILITY:  [facility]   PHYSICIAN:  Hillery Aldo, M.D.   DATE OF BIRTH:  July 24, 1951   DATE OF ADMISSION:  03/18/2006  DATE OF DISCHARGE:  03/20/2006                                 DISCHARGE SUMMARY   PRIMARY CARE PHYSICIAN:  Dr. Judie Petit. Simpson in [place], [place].   DISCHARGE DIAGNOSES:  1.  Syncope.  2.  Symptomatic bradycardia secondary to beta blocker.  3.  Hypertension.  4.  Asthma.  5.  Gastroesophageal reflux disease.  6.  Hypokalemia, resolved.  7.  Depression.  8.  Insomnia.  9.  Migraine headaches.   DISCHARGE MEDICATIONS:  1.  Lexapro 20 mg daily.  2.  Singulair 10 mg daily.  3.  Maxalt p.r.n. migraine headaches.  4.  Albuterol 2 puffs t.i.d. p.r.n.  5.  Atrovent 2 puffs every other day p.r.n.  6.  Zegerid 40 mg daily.  7.  Restoril 30 mg q.h.s. p.r.n.  8.  Hydrochlorothiazide 12.5 mg daily.  9.  Dicyclomine 1/2-1 tab t.i.d. p.r.n.   Note, the patient was instructed to stop Inderal until she follows up with  her primary care physician.   CONSULTATIONS:  None.   BRIEF ADMISSION HISTORY OF PRESENT ILLNESS:  The patient is a 60 year old  female who was visiting with another patient who began getting dizzy and  complaining of vertigo.  She had a syncopal episode without any seizure  activity or incontinence.  The patient also reports a problem with vertigo  in the past and states that this feels similar.  On initial evaluation, she  was found to be bradycardic with heart rates in the 40s.  She was admitted  for further telemetric monitoring and treatment.   PROCEDURES AND DIAGNOSTIC STUDIES:  None.   DISCHARGE LABORATORY VALUES:  Sodium was 142, potassium 3.8, chloride 109,  bicarb 26, BUN 9, creatinine 0.9, glucose 89.  White blood cell count was  7.3, hemoglobin 12.1, hematocrit 36.6,  platelet count 277.  TSH was 1.241.   HOSPITAL COURSE BY PROBLEM:  #1 - SYNCOPE SECONDARY TO BRADYCARDIA:  The  patient was monitored on telemetry and her beta blocker held.  Initially,  her heart rate was in the low 40s.  With holding of her beta blocker, her  heart rate increased into the 60s to 70s.  The patient was instructed to  discontinue use of this medication until she follows up with her primary  care physician.  Also, her TSH was checked and found to be within normal  limits.  At the time of discharge, she is no longer complaining of vertigo  or dizziness.   #2 - HYPERTENSION:  The patient's hypertension was controlled despite  withholding her beta-blocker.  She was started on Norvasc 5 mg b.i.d. for  blood pressure control.  Consideration to continue this medication should be  made by her primary care physician as an outpatient.  We will discharge her  only on her prior regimen of hydrochlorothiazide.   #  3 - ASTHMA:  The patient's asthma was controlled during her  hospitalization.   #4 - GASTROESOPHAGEAL REFLUX DISEASE:  The patient's GERD was controlled  with proton pump inhibitor therapy.   #5 - HYPOKALEMIA:  The patient's potassium was repleted prior to discharge.   #6 - DEPRESSION/INSOMNIA:  The patient was continued on her usual home  medications.   #7 - MIGRAINE HEADACHES:  The patient was treated with Extra-Strength  Tylenol.   DISPOSITION:  The patient was instructed to follow up with her primary care  physician, Dr. Judie Petit. Simpson early next week for a blood pressure check and  possible adjustment of her antihypertensive medications.  She will also have  her pulse checked during that visit.  She is instructed to discontinue  smoking and did undergo tobacco cessation counseling.  Additionally, she was  instructed to return to the emergency department for any episodes of  dizziness or presyncope or syncope.           ______________________________  Hillery Aldo, M.D.     CR/MEDQ  D:  03/20/2006  T:  03/20/2006  Job:  [id number]   cc:   Milus Mallick. Lodema Hong, M.D.  Fax: [phone]

## 2011-03-14 NOTE — Op Note (Signed)
NAMEMICHAELYN, [name]          ACCOUNT NO.:  [id number]   MEDICAL RECORD NO.:  [id number]          PATIENT TYPE:  AMB   LOCATION:  DAY                           FACILITY:  [facility]   PHYSICIAN:  Lionel December, M.D.    DATE OF BIRTH:  14-Aug-1951   DATE OF PROCEDURE:  12/03/2005  DATE OF DISCHARGE:                                 OPERATIVE REPORT   PROCEDURE:  Esophagogastroduodenoscopy followed by colonoscopy with  polypectomy.   INDICATION:  [name] is a 60 year old Afro-American female with chronic  GERD who is having breakthrough symptoms despite therapy who also has  postprandial pain, chronic nonbloody diarrhea and over 30-pound weight loss.  She is undergoing diagnostic evaluation. Procedure and risks were reviewed  with the patient, and informed consent was obtained.   MEDICINES FOR CONSCIOUS SEDATION:  Cetacaine spray for pharyngeal topical  anesthesia, Demerol 50 mg IV, Versed 12 mg IV divided dose.   FINDINGS:  Procedure performed in endoscopy suite. The patient's vital signs  and O2 saturation were monitored during procedure and remained stable.   PROCEDURE #1:  Esophagogastroduodenoscopy. The patient was placed in left  lateral position and Olympus videoscope was passed via oropharynx without  any difficulty into esophagus.   Esophagus. Mucosa of the esophagus was normal. GE junction was at 35 cm, and  there was focal erythema. Hiatus was at 37 cm. There was a small sliding  hiatal hernia.   Stomach was empty and distended very well with insufflation. There was  antral/prepyloric scar with wide open but deformed pylorus. Angularis,  fundus and cardia were examined by retroflexing the scope and were normal.   Duodenum. Bulbar mucosa was normal. Scope was passed into the second part of  duodenum where mucosa and folds were normal. The endoscope was withdrawn,  and the patient prepared for procedure #2.   Colonoscopy. Rectal examination performed. No abnormality  noted on external  or digital exam. Olympus videoscope was placed in rectum and advanced under  vision into sigmoid colon and beyond. Preparation was satisfactory. Scope  was passed into and cecum which was identified by appendiceal orifice and  ileocecal valve. Pictures taken for the record. There was a flat or sessile  polyp just above and to the right of ileocecal valve about 2 cm in diameter.  This was raised with submucosal injection of normal saline, and piecemeal  polypectomy was performed. Polypectomy was felt to be complete. There was no  bleeding noted from polypectomy site. The couple of small pieces was  suctioned through the channel, and large piece was caught with Dormia  basket. As the scope was withdrawn, colonic mucosa was once again carefully  examined. There were no other abnormalities. Scope was passed again into  sigmoid colon. Once the polyp was retrieved, endoscope was passed again in  sigmoid colon, and random biopsies were taken looking for microscopic  colitis. Rectal mucosa was normal. Scope was retroflexed to examine  anorectal junction which was unremarkable. Endoscope was withdrawn. The  patient tolerated the procedures well.   FINAL DIAGNOSIS:  1.  Small sliding hiatal hernia with mild changes of reflux esophagitis  limited to GE junction.  2.  Antral/prepyloric scar with patent but deformed pylorus indicative of      prior peptic ulcer disease which she gives history of. H pylori status      is unknown.  3.  Large sessile polyp at the ascending colon which was snared piecemeal as      described above.  4.  Random biopsy taken from normal-appearing sigmoid colon looking for      microscopic and/or collections colitis.   RECOMMENDATIONS:  1.  Anti-reflux measures reinforced.  2.  No aspirin for 10 days.  3.  Discontinue Nexium and start Zegerid 40 mg p.o. q.a.m. She will stop by      the office to pick up samples.  4.  Dicyclomine 10 mg before each  meal.  5.  Will check a sed rate and H pylori today. I will be contacting the      patient with results of pending studies and further recommendations.      Lionel December, M.D.  Electronically Signed     NR/MEDQ  D:  12/03/2005  T:  12/03/2005  Job:  [id number]   cc:   Milus Mallick. Lodema Hong, M.D.  Fax: [phone]

## 2011-03-18 DIAGNOSIS — N760 Acute vaginitis: Secondary | ICD-10-CM | POA: Insufficient documentation

## 2011-04-14 ENCOUNTER — Other Ambulatory Visit: Payer: Self-pay | Admitting: Family Medicine

## 2011-04-24 ENCOUNTER — Encounter (INDEPENDENT_AMBULATORY_CARE_PROVIDER_SITE_OTHER): Payer: Self-pay | Admitting: Psychiatry

## 2011-04-24 DIAGNOSIS — F329 Major depressive disorder, single episode, unspecified: Secondary | ICD-10-CM

## 2011-06-02 ENCOUNTER — Other Ambulatory Visit: Payer: Self-pay | Admitting: Family Medicine

## 2011-06-05 ENCOUNTER — Encounter (INDEPENDENT_AMBULATORY_CARE_PROVIDER_SITE_OTHER): Payer: Medicaid Other | Admitting: Psychiatry

## 2011-06-05 DIAGNOSIS — F329 Major depressive disorder, single episode, unspecified: Secondary | ICD-10-CM

## 2011-06-30 ENCOUNTER — Other Ambulatory Visit: Payer: Self-pay | Admitting: Family Medicine

## 2011-07-17 ENCOUNTER — Encounter (HOSPITAL_COMMUNITY): Payer: Medicaid Other | Admitting: Psychiatry

## 2011-07-18 ENCOUNTER — Encounter (HOSPITAL_COMMUNITY): Payer: Medicaid Other | Admitting: Psychiatry

## 2011-08-11 LAB — BASIC METABOLIC PANEL
BUN: 4 — ABNORMAL LOW
CO2: 23
Chloride: 108
Glucose, Bld: 100 — ABNORMAL HIGH
Potassium: 3.4 — ABNORMAL LOW
Sodium: 142

## 2011-09-16 ENCOUNTER — Other Ambulatory Visit (HOSPITAL_COMMUNITY): Payer: Self-pay | Admitting: Psychiatry

## 2011-09-16 MED ORDER — MIRTAZAPINE 30 MG PO TABS: 45.0000 mg | ORAL_TABLET | Freq: Every day | ORAL | Status: DC

## 2011-09-20 ENCOUNTER — Other Ambulatory Visit: Payer: Self-pay | Admitting: Family Medicine

## 2011-09-29 ENCOUNTER — Ambulatory Visit (HOSPITAL_COMMUNITY): Payer: Medicaid Other | Admitting: Psychiatry

## 2011-10-06 ENCOUNTER — Ambulatory Visit (HOSPITAL_COMMUNITY): Payer: Medicaid Other | Admitting: Psychiatry

## 2011-10-24 ENCOUNTER — Other Ambulatory Visit: Payer: Self-pay | Admitting: Family Medicine

## 2011-11-13 ENCOUNTER — Ambulatory Visit (HOSPITAL_COMMUNITY): Payer: Medicaid Other | Admitting: Psychiatry

## 2011-11-14 ENCOUNTER — Other Ambulatory Visit (HOSPITAL_COMMUNITY): Payer: Self-pay | Admitting: Psychiatry

## 2011-11-19 ENCOUNTER — Other Ambulatory Visit: Payer: Self-pay | Admitting: Family Medicine

## 2011-11-28 ENCOUNTER — Telehealth: Payer: Self-pay

## 2011-11-28 DIAGNOSIS — R5381 Other malaise: Secondary | ICD-10-CM

## 2011-11-28 DIAGNOSIS — I10 Essential (primary) hypertension: Secondary | ICD-10-CM

## 2011-11-28 DIAGNOSIS — M899 Disorder of bone, unspecified: Secondary | ICD-10-CM | POA: Diagnosis not present

## 2011-11-28 DIAGNOSIS — R5383 Other fatigue: Secondary | ICD-10-CM | POA: Diagnosis not present

## 2011-11-28 DIAGNOSIS — R7301 Impaired fasting glucose: Secondary | ICD-10-CM

## 2011-11-28 DIAGNOSIS — E785 Hyperlipidemia, unspecified: Secondary | ICD-10-CM | POA: Diagnosis not present

## 2011-11-28 NOTE — Telephone Encounter (Signed)
Needs labs ordered to do before next visit. Mailed to pt

## 2011-12-08 ENCOUNTER — Ambulatory Visit: Payer: Medicaid Other | Admitting: Family Medicine

## 2011-12-15 ENCOUNTER — Ambulatory Visit: Payer: Medicaid Other | Admitting: Family Medicine

## 2011-12-15 ENCOUNTER — Encounter: Payer: Self-pay | Admitting: Family Medicine

## 2011-12-16 ENCOUNTER — Other Ambulatory Visit (HOSPITAL_COMMUNITY): Payer: Self-pay | Admitting: Psychiatry

## 2011-12-17 ENCOUNTER — Other Ambulatory Visit (HOSPITAL_COMMUNITY): Payer: Self-pay | Admitting: Psychiatry

## 2011-12-18 ENCOUNTER — Other Ambulatory Visit (HOSPITAL_COMMUNITY): Payer: Self-pay | Admitting: Psychiatry

## 2011-12-25 ENCOUNTER — Encounter (HOSPITAL_COMMUNITY): Payer: Self-pay | Admitting: Psychiatry

## 2011-12-25 ENCOUNTER — Ambulatory Visit (HOSPITAL_COMMUNITY): Payer: Medicaid Other | Admitting: Psychiatry

## 2011-12-25 ENCOUNTER — Ambulatory Visit (INDEPENDENT_AMBULATORY_CARE_PROVIDER_SITE_OTHER): Payer: Medicaid Other | Admitting: Psychiatry

## 2011-12-25 VITALS — BP 159/89 | HR 69 | Wt 170.0 lb

## 2011-12-25 DIAGNOSIS — F17200 Nicotine dependence, unspecified, uncomplicated: Secondary | ICD-10-CM

## 2011-12-25 DIAGNOSIS — N760 Acute vaginitis: Secondary | ICD-10-CM

## 2011-12-25 DIAGNOSIS — B356 Tinea cruris: Secondary | ICD-10-CM

## 2011-12-25 DIAGNOSIS — R5383 Other fatigue: Secondary | ICD-10-CM

## 2011-12-25 DIAGNOSIS — Z79899 Other long term (current) drug therapy: Secondary | ICD-10-CM

## 2011-12-25 DIAGNOSIS — IMO0002 Reserved for concepts with insufficient information to code with codable children: Secondary | ICD-10-CM

## 2011-12-25 DIAGNOSIS — E785 Hyperlipidemia, unspecified: Secondary | ICD-10-CM

## 2011-12-25 DIAGNOSIS — F329 Major depressive disorder, single episode, unspecified: Secondary | ICD-10-CM

## 2011-12-25 DIAGNOSIS — I10 Essential (primary) hypertension: Secondary | ICD-10-CM

## 2011-12-25 DIAGNOSIS — R7301 Impaired fasting glucose: Secondary | ICD-10-CM

## 2011-12-25 DIAGNOSIS — N3000 Acute cystitis without hematuria: Secondary | ICD-10-CM

## 2011-12-25 DIAGNOSIS — R5381 Other malaise: Secondary | ICD-10-CM

## 2011-12-25 DIAGNOSIS — J449 Chronic obstructive pulmonary disease, unspecified: Secondary | ICD-10-CM

## 2011-12-25 MED ORDER — MIRTAZAPINE 30 MG PO TABS: 30.0000 mg | ORAL_TABLET | Freq: Every day | ORAL | Status: DC

## 2011-12-25 MED ORDER — BUPROPION HCL ER (XL) 300 MG PO TB24: 300.0000 mg | ORAL_TABLET | Freq: Every day | ORAL | Status: DC
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
## 2011-12-26 ENCOUNTER — Telehealth (HOSPITAL_COMMUNITY): Payer: Self-pay | Admitting: *Deleted

## 2012-01-20 ENCOUNTER — Other Ambulatory Visit: Payer: Self-pay | Admitting: Family Medicine

## 2012-01-20 ENCOUNTER — Encounter: Payer: Self-pay | Admitting: Family Medicine

## 2012-01-20 ENCOUNTER — Ambulatory Visit (INDEPENDENT_AMBULATORY_CARE_PROVIDER_SITE_OTHER): Payer: Medicare Other | Admitting: Family Medicine

## 2012-01-20 VITALS — BP 160/90 | HR 72 | Resp 16 | Ht 63.5 in | Wt 168.0 lb

## 2012-01-20 DIAGNOSIS — K219 Gastro-esophageal reflux disease without esophagitis: Secondary | ICD-10-CM

## 2012-01-20 DIAGNOSIS — J449 Chronic obstructive pulmonary disease, unspecified: Secondary | ICD-10-CM

## 2012-01-20 DIAGNOSIS — F329 Major depressive disorder, single episode, unspecified: Secondary | ICD-10-CM | POA: Diagnosis not present

## 2012-01-20 DIAGNOSIS — G43909 Migraine, unspecified, not intractable, without status migrainosus: Secondary | ICD-10-CM | POA: Insufficient documentation

## 2012-01-20 DIAGNOSIS — M949 Disorder of cartilage, unspecified: Secondary | ICD-10-CM | POA: Diagnosis not present

## 2012-01-20 DIAGNOSIS — F17200 Nicotine dependence, unspecified, uncomplicated: Secondary | ICD-10-CM

## 2012-01-20 DIAGNOSIS — I10 Essential (primary) hypertension: Secondary | ICD-10-CM

## 2012-01-20 DIAGNOSIS — M899 Disorder of bone, unspecified: Secondary | ICD-10-CM | POA: Diagnosis not present

## 2012-01-20 DIAGNOSIS — E785 Hyperlipidemia, unspecified: Secondary | ICD-10-CM

## 2012-01-20 DIAGNOSIS — E559 Vitamin D deficiency, unspecified: Secondary | ICD-10-CM

## 2012-01-20 DIAGNOSIS — R5381 Other malaise: Secondary | ICD-10-CM | POA: Diagnosis not present

## 2012-01-20 DIAGNOSIS — R7301 Impaired fasting glucose: Secondary | ICD-10-CM | POA: Diagnosis not present

## 2012-01-20 MED ORDER — PANTOPRAZOLE SODIUM 20 MG PO TBEC: 20.0000 mg | DELAYED_RELEASE_TABLET | Freq: Every day | ORAL | Status: DC

## 2012-01-20 MED ORDER — RIZATRIPTAN BENZOATE 10 MG PO TABS: ORAL_TABLET | ORAL | Status: DC

## 2012-01-20 MED ORDER — TOPIRAMATE 25 MG PO TABS: 25.0000 mg | ORAL_TABLET | Freq: Two times a day (BID) | ORAL | Status: DC

## 2012-01-20 NOTE — Patient Instructions (Signed)
F/u in 3 month  You have new medication for headache, topamax and maxalt. For blood pressure resume maxzide and clonidine.  You need to stop smoking.  For COPD , medication prescribed is symbicort and proair  toradol 60mg  IM in the office today for headache

## 2012-01-20 NOTE — Progress Notes (Signed)
  Subjective:    Patient ID: [name], female    DOB: 08-Jun-1951, 61 y.o.   MRN: [id number]  HPI The PT is here for follow up and re-evaluation of chronic medical conditions, medication management and review of any available recent lab and radiology data.  Preventive health is updated, specifically  Cancer screening and Immunization.   Questions or concerns regarding consultations or procedures which the PT has had in the interim are  addressed. The PT denies any adverse reactions to current medications since the last visit.  Pt moved out of the area approxinmately 8 months ago, reports no regular health care, now new PCP or psych provider, adn dissatisfaction with home environment. Smoking more than before, depressed, headaches   Review of Systems See HPI Denies recent fever or chills. Denies sinus pressure,  ear pain or sore throat.Increased nasal congestion with clear drainage. Chronic smoker's cough Denies  wheezing. Denies chest pains, palpitations and leg swelling Denies abdominal pain, nausea, vomiting,diarrhea or constipation.   Chronic  joint pain,  and limitation in mobility. Increeased headaches,denies  seizures, numbness, or tingling. Increased  Depression and  anxiety not suicidal or homicidal Denies skin break down or rash.        Objective:   Physical Exam  Patient alert and oriented and in no cardiopulmonary distress.  HEENT: No facial asymmetry, EOMI, no sinus tenderness,  oropharynx pink and moist.  Neck supple no adenopathy.  Chest: Clear to auscultation bilaterally.Poor air entry throughout  CVS: S1, S2 no murmurs, no S3.  ABD: Soft non tender. Bowel sounds normal.  Ext: No edema  MS: Decreased  ROM spine, shoulders, hips and knees.  Skin: Intact, no ulcerations or rash noted.  Psych: Good eye contact,flat  affect. Memory impaired  both anxious and depressed appearing.  CNS: CN 2-12 intact, power, tone and sensation normal  throughout.       Assessment & Plan:

## 2012-01-20 NOTE — Assessment & Plan Note (Signed)
Deteriorated, recently re established with psychiatry

## 2012-01-20 NOTE — Assessment & Plan Note (Signed)
3 month h/o increased headache frequency, on avg twice per week

## 2012-01-20 NOTE — Assessment & Plan Note (Signed)
Deteriorated and uncontrolled, needs to stop smoking, also resume symbicort and proair

## 2012-01-20 NOTE — Assessment & Plan Note (Signed)
uncontrolled needs meds resumed

## 2012-01-21 ENCOUNTER — Other Ambulatory Visit: Payer: Self-pay

## 2012-01-21 DIAGNOSIS — E785 Hyperlipidemia, unspecified: Secondary | ICD-10-CM | POA: Insufficient documentation

## 2012-01-21 DIAGNOSIS — G43909 Migraine, unspecified, not intractable, without status migrainosus: Secondary | ICD-10-CM | POA: Diagnosis not present

## 2012-01-21 LAB — VITAMIN D 25 HYDROXY (VIT D DEFICIENCY, FRACTURES): Vit D, 25-Hydroxy: 16 ng/mL — ABNORMAL LOW (ref 30–89)

## 2012-01-21 LAB — CBC WITH DIFFERENTIAL/PLATELET
Eosinophils Absolute: 0.2 10*3/uL (ref 0.0–0.7)
Eosinophils Relative: 3 % (ref 0–5)
Hemoglobin: 13.6 g/dL (ref 12.0–15.0)
Lymphocytes Relative: 45 % (ref 12–46)
Lymphs Abs: 3.1 10*3/uL (ref 0.7–4.0)
MCH: 27.8 pg (ref 26.0–34.0)
MCV: 87.8 fL (ref 78.0–100.0)
Monocytes Relative: 8 % (ref 3–12)
RBC: 4.9 MIL/uL (ref 3.87–5.11)
WBC: 7 10*3/uL (ref 4.0–10.5)

## 2012-01-21 LAB — COMPLETE METABOLIC PANEL WITH GFR
AST: 22 U/L (ref 0–37)
Albumin: 4.2 g/dL (ref 3.5–5.2)
Alkaline Phosphatase: 113 U/L (ref 39–117)
BUN: 11 mg/dL (ref 6–23)
GFR, Est Non African American: 53 mL/min — ABNORMAL LOW
Glucose, Bld: 115 mg/dL — ABNORMAL HIGH (ref 70–99)
Potassium: 4.4 mEq/L (ref 3.5–5.3)
Sodium: 141 mEq/L (ref 135–145)
Total Bilirubin: 0.5 mg/dL (ref 0.3–1.2)

## 2012-01-21 LAB — HEMOGLOBIN A1C: Mean Plasma Glucose: 126 mg/dL — ABNORMAL HIGH (ref <117)

## 2012-01-21 LAB — LIPID PANEL
Cholesterol: 252 mg/dL — ABNORMAL HIGH (ref 0–200)
HDL: 38 mg/dL — ABNORMAL LOW (ref >39)
Total CHOL/HDL Ratio: 6.6 Ratio
VLDL: 41 mg/dL — ABNORMAL HIGH (ref 0–40)

## 2012-01-21 LAB — TSH: TSH: 1.01 u[IU]/mL (ref 0.350–4.500)

## 2012-01-21 MED ORDER — CLONIDINE HCL 0.3 MG PO TABS: ORAL_TABLET | ORAL | Status: DC

## 2012-01-21 MED ORDER — BUDESONIDE-FORMOTEROL FUMARATE 80-4.5 MCG/ACT IN AERO: 2.0000 | INHALATION_SPRAY | Freq: Two times a day (BID) | RESPIRATORY_TRACT | Status: DC

## 2012-01-21 MED ORDER — KETOROLAC TROMETHAMINE 60 MG/2ML IM SOLN
60.0000 mg | Freq: Once | INTRAMUSCULAR | Status: AC
  Administered 2012-01-21: 60 mg via INTRAMUSCULAR

## 2012-01-21 MED ORDER — ALBUTEROL SULFATE HFA 108 (90 BASE) MCG/ACT IN AERS: 2.0000 | INHALATION_SPRAY | RESPIRATORY_TRACT | Status: DC

## 2012-01-21 MED ORDER — PRAVASTATIN SODIUM 40 MG PO TABS: 40.0000 mg | ORAL_TABLET | Freq: Every evening | ORAL | Status: DC

## 2012-01-21 MED ORDER — ERGOCALCIFEROL 1.25 MG (50000 UT) PO CAPS: 50000.0000 [IU] | ORAL_CAPSULE | ORAL | Status: DC

## 2012-01-21 MED ORDER — TRIAMTERENE-HCTZ 75-50 MG PO TABS: ORAL_TABLET | ORAL | Status: DC

## 2012-01-21 NOTE — Assessment & Plan Note (Signed)
Hyperlipidemia:Low fat diet discussed and encouraged.   

## 2012-01-26 ENCOUNTER — Ambulatory Visit (INDEPENDENT_AMBULATORY_CARE_PROVIDER_SITE_OTHER): Payer: Medicare Other | Admitting: Psychiatry

## 2012-01-26 ENCOUNTER — Encounter (HOSPITAL_COMMUNITY): Payer: Self-pay | Admitting: Psychiatry

## 2012-01-26 DIAGNOSIS — F329 Major depressive disorder, single episode, unspecified: Secondary | ICD-10-CM

## 2012-01-26 MED ORDER — MIRTAZAPINE 30 MG PO TABS: 30.0000 mg | ORAL_TABLET | Freq: Every day | ORAL | Status: DC

## 2012-01-26 MED ORDER — BUPROPION HCL ER (XL) 300 MG PO TB24: 300.0000 mg | ORAL_TABLET | Freq: Every day | ORAL | Status: DC
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
## 2012-02-01 ENCOUNTER — Encounter (HOSPITAL_COMMUNITY): Payer: Self-pay | Admitting: *Deleted

## 2012-02-01 ENCOUNTER — Emergency Department (HOSPITAL_COMMUNITY): Payer: Medicare Other

## 2012-02-01 ENCOUNTER — Emergency Department (HOSPITAL_COMMUNITY)
Admission: EM | Admit: 2012-02-01 | Discharge: 2012-02-01 | Disposition: A | Discharge status: Home / Self Care | Payer: Medicare Other | Attending: Emergency Medicine | Admitting: Emergency Medicine

## 2012-02-01 DIAGNOSIS — R141 Gas pain: Secondary | ICD-10-CM | POA: Diagnosis not present

## 2012-02-01 DIAGNOSIS — R109 Unspecified abdominal pain: Secondary | ICD-10-CM | POA: Insufficient documentation

## 2012-02-01 DIAGNOSIS — R142 Eructation: Secondary | ICD-10-CM | POA: Insufficient documentation

## 2012-02-01 DIAGNOSIS — F329 Major depressive disorder, single episode, unspecified: Secondary | ICD-10-CM | POA: Insufficient documentation

## 2012-02-01 DIAGNOSIS — M549 Dorsalgia, unspecified: Secondary | ICD-10-CM | POA: Insufficient documentation

## 2012-02-01 DIAGNOSIS — I10 Essential (primary) hypertension: Secondary | ICD-10-CM | POA: Insufficient documentation

## 2012-02-01 DIAGNOSIS — R10819 Abdominal tenderness, unspecified site: Secondary | ICD-10-CM | POA: Diagnosis not present

## 2012-02-01 DIAGNOSIS — R112 Nausea with vomiting, unspecified: Secondary | ICD-10-CM | POA: Insufficient documentation

## 2012-02-01 DIAGNOSIS — R1915 Other abnormal bowel sounds: Secondary | ICD-10-CM | POA: Insufficient documentation

## 2012-02-01 DIAGNOSIS — Z79899 Other long term (current) drug therapy: Secondary | ICD-10-CM | POA: Insufficient documentation

## 2012-02-01 DIAGNOSIS — K5900 Constipation, unspecified: Secondary | ICD-10-CM | POA: Diagnosis not present

## 2012-02-01 DIAGNOSIS — E785 Hyperlipidemia, unspecified: Secondary | ICD-10-CM | POA: Diagnosis not present

## 2012-02-01 DIAGNOSIS — F17200 Nicotine dependence, unspecified, uncomplicated: Secondary | ICD-10-CM | POA: Insufficient documentation

## 2012-02-01 DIAGNOSIS — F3289 Other specified depressive episodes: Secondary | ICD-10-CM | POA: Insufficient documentation

## 2012-02-01 DIAGNOSIS — R1084 Generalized abdominal pain: Secondary | ICD-10-CM | POA: Diagnosis not present

## 2012-02-01 DIAGNOSIS — Z9889 Other specified postprocedural states: Secondary | ICD-10-CM | POA: Diagnosis not present

## 2012-02-01 LAB — URINALYSIS, ROUTINE W REFLEX MICROSCOPIC
Nitrite: NEGATIVE
Specific Gravity, Urine: 1.017 (ref 1.005–1.030)
Urobilinogen, UA: 1 mg/dL (ref 0.0–1.0)

## 2012-02-01 LAB — DIFFERENTIAL
Basophils Absolute: 0 10*3/uL (ref 0.0–0.1)
Lymphocytes Relative: 7 % — ABNORMAL LOW (ref 12–46)
Lymphs Abs: 1.1 10*3/uL (ref 0.7–4.0)
Monocytes Absolute: 0.7 10*3/uL (ref 0.1–1.0)
Monocytes Relative: 5 % (ref 3–12)
Neutro Abs: 12.8 10*3/uL — ABNORMAL HIGH (ref 1.7–7.7)

## 2012-02-01 LAB — CBC
HCT: 45.9 % (ref 36.0–46.0)
Hemoglobin: 15.3 g/dL — ABNORMAL HIGH (ref 12.0–15.0)
MCV: 85.6 fL (ref 78.0–100.0)
RBC: 5.36 MIL/uL — ABNORMAL HIGH (ref 3.87–5.11)
WBC: 14.6 10*3/uL — ABNORMAL HIGH (ref 4.0–10.5)

## 2012-02-01 LAB — COMPREHENSIVE METABOLIC PANEL
AST: 28 U/L (ref 0–37)
BUN: 22 mg/dL (ref 6–23)
CO2: 25 mEq/L (ref 19–32)
Chloride: 100 mEq/L (ref 96–112)
Creatinine, Ser: 1.4 mg/dL — ABNORMAL HIGH (ref 0.50–1.10)
GFR calc Af Amer: 46 mL/min — ABNORMAL LOW (ref >90)
GFR calc non Af Amer: 40 mL/min — ABNORMAL LOW (ref >90)
Glucose, Bld: 202 mg/dL — ABNORMAL HIGH (ref 70–99)
Total Bilirubin: 0.4 mg/dL (ref 0.3–1.2)

## 2012-02-01 LAB — POCT I-STAT, CHEM 8
Calcium, Ion: 1.25 mmol/L (ref 1.12–1.32)
Glucose, Bld: 195 mg/dL — ABNORMAL HIGH (ref 70–99)
HCT: 50 % — ABNORMAL HIGH (ref 36.0–46.0)
Hemoglobin: 17 g/dL — ABNORMAL HIGH (ref 12.0–15.0)
Potassium: 4.1 mEq/L (ref 3.5–5.1)

## 2012-02-01 MED ORDER — ONDANSETRON HCL 4 MG PO TABS: 4.0000 mg | ORAL_TABLET | Freq: Four times a day (QID) | ORAL | Status: DC

## 2012-02-01 MED ORDER — SODIUM CHLORIDE 0.9 % IV BOLUS (SEPSIS)
1000.0000 mL | Freq: Once | INTRAVENOUS | Status: AC
  Administered 2012-02-01: 1000 mL via INTRAVENOUS

## 2012-02-01 MED ORDER — DISPOSABLE ENEMA 19-7 GM/118ML RE ENEM: 1.0000 | ENEMA | Freq: Two times a day (BID) | RECTAL | Status: AC

## 2012-02-01 MED ORDER — HYDROMORPHONE HCL PF 1 MG/ML IJ SOLN
1.0000 mg | Freq: Once | INTRAMUSCULAR | Status: AC
  Administered 2012-02-01: 1 mg via INTRAVENOUS
  Filled 2012-02-01: qty 1

## 2012-02-01 MED ORDER — ONDANSETRON HCL 4 MG/2ML IJ SOLN
4.0000 mg | Freq: Once | INTRAMUSCULAR | Status: AC
  Administered 2012-02-01: 4 mg via INTRAVENOUS
  Filled 2012-02-01: qty 2

## 2012-02-01 MED ORDER — ONDANSETRON 8 MG PO TBDP
ORAL_TABLET | ORAL | Status: AC
  Filled 2012-02-01: qty 1

## 2012-02-01 MED ORDER — ONDANSETRON 8 MG PO TBDP
ORAL_TABLET | ORAL | Status: AC
  Administered 2012-02-01: 8 mg
  Filled 2012-02-01: qty 1

## 2012-02-01 MED ORDER — IOHEXOL 300 MG/ML  SOLN
100.0000 mL | Freq: Once | INTRAMUSCULAR | Status: AC | PRN
  Administered 2012-02-01: 100 mL via INTRAVENOUS

## 2012-02-01 MED ORDER — POLYETHYLENE GLYCOL 3350 17 GM/SCOOP PO POWD: 17.0000 g | Freq: Every day | ORAL | Status: AC

## 2012-02-01 NOTE — Discharge Instructions (Signed)
Return to the ED with any concerns including fever, vomiting and not able to keep down liquids, worsening pain, blood in stool or vomit, decreased level of alertness/lethargy, or any other alarming symptoms

## 2012-02-01 NOTE — ED Notes (Signed)
Patient actively vomiting 50ml of yellow bile. Pt given 8mg  zofran ODT.

## 2012-02-01 NOTE — ED Notes (Signed)
Daughter leaving ED. Celso Sickle. Phone number [phone].

## 2012-02-01 NOTE — ED Notes (Signed)
Pt c/o abdominal pain radiating to back and vomiting since 2100 tonight. Pt has hx of abdominal pain w/ eating for approximately 1 year.

## 2012-02-01 NOTE — ED Provider Notes (Addendum)
History     CSN: [id number]  Arrival date & time 02/01/12  0048   First MD Initiated Contact with Patient 02/01/12 0448      Chief Complaint  Patient presents with  . Abdominal Pain  . Back Pain  . Emesis    (Consider location/radiation/quality/duration/timing/severity/associated sxs/prior treatment) HPI Pt presents with pain in her mid abdomen- pain is sharp and cramping in nature.  Began approx 9pm tonight.  Associated with several episodes of emesis- nonbloody and nonbilious.  No melena or blood in stools.  No fever.  She states she has had similar pain in the past, but tonight the pain is more severe. Pain radiates around to both sides of her back.  No fever/chills.  Denies dysuria.    Past Medical History  Diagnosis Date  . Hx of migraines   . Panic attack   . Depression   . Hyperlipidemia   . Hypertension   . Urinary incontinence   . Back pain with radiation     radiopathy   . Nicotine addiction     Past Surgical History  Procedure Date  . Cholecystectomy   . Abdominal hysterectomy   . Total abdominal hysterectomy w/ bilateral salpingoophorectomy   . Hiatal hernia repair   . Removal of mild ducts from left breast 1982  . Bleeding ulcer repair 1992  . Left ankle surgery 2008  . Double mastectomy June 04, 2010    Family History  Problem Relation Age of Onset  . Heart disease Mother   . Diabetes Mother   . Lupus Mother   . Tuberculosis Father   . Diabetes Brother   . Depression Sister     History  Substance Use Topics  . Smoking status: Current Everyday Smoker -- 0.0 packs/day    Types: Cigarettes  . Smokeless tobacco: Not on file  . Alcohol Use: No    OB History    Grav Para Term Preterm Abortions TAB SAB Ect Mult Living                  Review of Systems ROS reviewed and all otherwise negative except for mentioned in HPI  Allergies  Aspirin and Penicillins  Home Medications   Current Outpatient Rx  Name Route Sig Dispense Refill  .  ALBUTEROL SULFATE HFA 108 (90 BASE) MCG/ACT IN AERS Inhalation Inhale 2 puffs into the lungs as directed. 1 Inhaler 3  . ALPRAZOLAM 0.5 MG PO TABS Oral Take 0.5 mg by mouth 3 (three) times daily as needed. Anxiety    . BUDESONIDE-FORMOTEROL FUMARATE 80-4.5 MCG/ACT IN AERO Inhalation Inhale 2 puffs into the lungs 2 (two) times daily. 1 Inhaler 3  . BUPROPION HCL ER (XL) 300 MG PO TB24 Oral Take 1 tablet (300 mg total) by mouth daily. 30 tablet 1  . CLONIDINE HCL 0.3 MG PO TABS  TAKE 1&1/2 TABLET EVERY NIGHT AT BEDTIME 45 tablet 3  . DICYCLOMINE HCL 10 MG PO CAPS Oral Take 10 mg by mouth 4 (four) times daily -  before meals and at bedtime.    . ERGOCALCIFEROL 50000 UNITS PO CAPS Oral Take 1 capsule (50,000 Units total) by mouth once a week. One capsule once weekly 12 capsule 1  . MIRTAZAPINE 30 MG PO TABS Oral Take 1 tablet (30 mg total) by mouth at bedtime. 30 tablet 1  . PANTOPRAZOLE SODIUM 20 MG PO TBEC Oral Take 1 tablet (20 mg total) by mouth daily. 30 tablet 3  . PRAVASTATIN SODIUM 40  MG PO TABS Oral Take 1 tablet (40 mg total) by mouth every evening. 30 tablet 5  . RIZATRIPTAN BENZOATE 10 MG PO TABS  One tablet foe severe headache, may repeat after 2 hours if needed, maximum 3 tablets in 24 hours, maximum twice weekly 24 tablet 2  . TOPIRAMATE 25 MG PO TABS Oral Take 1 tablet (25 mg total) by mouth 2 (two) times daily. 60 tablet 2  . TRIAMTERENE-HCTZ 75-50 MG PO TABS  TAKE ONE TABLET BY MOUTH EVERY DAY 30 tablet 3  . DISPOSABLE ENEMA 19-7 GM/118ML RE ENEM Rectal Place 1 enema rectally 2 (two) times daily. follow package directions 2 enema 0    BP 137/82  Pulse 100  Temp(Src) 98 F (36.7 C) (Oral)  Resp 20  SpO2 100% Vitals reviewed Physical Exam Physical Examination: General appearance - alert, uncomfortable appearing, and in no distress Mental status - alert, oriented to person, place, and time Eyes - pupils equal and reactive, no scleral icterus Mouth - mucous membranes moist,  pharynx normal without lesions Chest - clear to auscultation, no wheezes, rales or rhonchi, symmetric air entry Heart - normal rate, regular rhythm, normal S1, S2, no murmurs, rubs, clicks or gallops Abdomen - soft, diffusely tender to palpation- mostly around umbilicus, no gaurding or rebound tenderness, nondistended, no masses or organomegaly Extremities - peripheral pulses normal, no pedal edema, no clubbing or cyanosis Skin - normal coloration and turgor, no rashes  ED Course  Procedures (including critical care time)  Labs Reviewed  CBC - Abnormal; Notable for the following:    WBC 14.6 (*)    RBC 5.36 (*)    Hemoglobin 15.3 (*)    All other components within normal limits  DIFFERENTIAL - Abnormal; Notable for the following:    Neutrophils Relative 88 (*)    Neutro Abs 12.8 (*)    Lymphocytes Relative 7 (*)    All other components within normal limits  COMPREHENSIVE METABOLIC PANEL - Abnormal; Notable for the following:    Glucose, Bld 202 (*)    Creatinine, Ser 1.40 (*)    Total Protein 8.7 (*)    Alkaline Phosphatase 146 (*)    GFR calc non Af Amer 40 (*)    GFR calc Af Amer 46 (*)    All other components within normal limits  POCT I-STAT, CHEM 8 - Abnormal; Notable for the following:    Creatinine, Ser 1.30 (*)    Glucose, Bld 195 (*)    Hemoglobin 17.0 (*)    HCT 50.0 (*)    All other components within normal limits  URINALYSIS, ROUTINE W REFLEX MICROSCOPIC - Abnormal; Notable for the following:    Color, Urine AMBER (*) BIOCHEMICALS MAY BE AFFECTED BY COLOR   Bilirubin Urine SMALL (*)    Ketones, ur TRACE (*)    All other components within normal limits  LAB REPORT - SCANNED   No results found.   1. Constipation       MDM  Pt presenting with mid abdominal pain, CT scan shows significant constipation, no evidence of obstruction or other acute intraabdominal process.  Pt with no vomiting in ED, pain improved after zofran and dilaudid.  Recommended fleets  enema as well as starting miralax for constipation.  Pt discharged with stirct return precautions.  She is agreeable with this plan.          Ethelda Chick, MD 02/06/12 0981  Ethelda Chick, MD 02/06/12 [phone]

## 2012-02-02 ENCOUNTER — Encounter: Payer: Self-pay | Admitting: Family Medicine

## 2012-02-02 NOTE — Assessment & Plan Note (Signed)
Unchanged , cessation counseling done 

## 2012-02-10 ENCOUNTER — Ambulatory Visit (HOSPITAL_COMMUNITY): Payer: Self-pay | Admitting: Licensed Clinical Social Worker

## 2012-03-19 ENCOUNTER — Ambulatory Visit (INDEPENDENT_AMBULATORY_CARE_PROVIDER_SITE_OTHER): Payer: Medicare Other | Admitting: Psychiatry

## 2012-03-19 ENCOUNTER — Encounter (HOSPITAL_COMMUNITY): Payer: Self-pay | Admitting: Psychiatry

## 2012-03-19 DIAGNOSIS — F329 Major depressive disorder, single episode, unspecified: Secondary | ICD-10-CM

## 2012-03-19 DIAGNOSIS — F3289 Other specified depressive episodes: Secondary | ICD-10-CM

## 2012-03-19 MED ORDER — MIRTAZAPINE 30 MG PO TABS: 30.0000 mg | ORAL_TABLET | Freq: Every day | ORAL | Status: DC

## 2012-03-19 MED ORDER — BUPROPION HCL ER (XL) 300 MG PO TB24: 300.0000 mg | ORAL_TABLET | Freq: Every day | ORAL | Status: DC
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
## 2012-03-27 ENCOUNTER — Other Ambulatory Visit (HOSPITAL_COMMUNITY): Payer: Self-pay | Admitting: Psychiatry

## 2012-03-28 ENCOUNTER — Other Ambulatory Visit (HOSPITAL_COMMUNITY): Payer: Self-pay | Admitting: Psychiatry

## 2012-04-22 ENCOUNTER — Ambulatory Visit (HOSPITAL_COMMUNITY): Payer: Self-pay | Admitting: Licensed Clinical Social Worker

## 2012-04-23 ENCOUNTER — Encounter (HOSPITAL_COMMUNITY): Payer: Self-pay | Admitting: Licensed Clinical Social Worker

## 2012-04-23 NOTE — Progress Notes (Signed)
Patient ID: [name], female   DOB: 1950/12/08, 61 y.o.   MRN: [id number] Patient was a no call/no show for today's initial appointment. This is the second no show for a new appointment. Will not schedule patient again.

## 2012-05-05 ENCOUNTER — Ambulatory Visit: Payer: Self-pay | Admitting: Family Medicine

## 2012-05-07 ENCOUNTER — Encounter: Payer: Self-pay | Admitting: Sports Medicine

## 2012-05-07 ENCOUNTER — Ambulatory Visit (INDEPENDENT_AMBULATORY_CARE_PROVIDER_SITE_OTHER): Payer: Medicare Other | Admitting: Sports Medicine

## 2012-05-07 VITALS — BP 108/74 | HR 66 | Temp 97.5°F | Ht 63.5 in | Wt 156.1 lb

## 2012-05-07 DIAGNOSIS — E785 Hyperlipidemia, unspecified: Secondary | ICD-10-CM

## 2012-05-07 DIAGNOSIS — R059 Cough, unspecified: Secondary | ICD-10-CM

## 2012-05-07 DIAGNOSIS — R7301 Impaired fasting glucose: Secondary | ICD-10-CM | POA: Diagnosis not present

## 2012-05-07 DIAGNOSIS — R05 Cough: Secondary | ICD-10-CM | POA: Diagnosis not present

## 2012-05-07 DIAGNOSIS — F17200 Nicotine dependence, unspecified, uncomplicated: Secondary | ICD-10-CM | POA: Diagnosis not present

## 2012-05-07 DIAGNOSIS — J449 Chronic obstructive pulmonary disease, unspecified: Secondary | ICD-10-CM | POA: Diagnosis not present

## 2012-05-07 DIAGNOSIS — E7800 Pure hypercholesterolemia, unspecified: Secondary | ICD-10-CM

## 2012-05-07 DIAGNOSIS — I10 Essential (primary) hypertension: Secondary | ICD-10-CM

## 2012-05-07 MED ORDER — AEROCHAMBER MAX W/MASK MEDIUM MISC: Status: DC

## 2012-05-07 NOTE — Patient Instructions (Addendum)
It was nice to meet you today. Please go to the [facility] radiology department for a chest x-ray. I sent a prescription for a spacer to use with your inhaler  Please consider stopping smoking.  Please see the attached stop smoking, guide from the [employer] I provided.  Also, please call, [phone] to enroll their program to become eligible for free medications.  Please follow up with me in one to 2 weeks to further address your other ongoing medical issues.

## 2012-05-17 DIAGNOSIS — E1169 Type 2 diabetes mellitus with other specified complication: Secondary | ICD-10-CM

## 2012-05-17 DIAGNOSIS — E782 Mixed hyperlipidemia: Secondary | ICD-10-CM | POA: Insufficient documentation

## 2012-05-17 HISTORY — DX: Type 2 diabetes mellitus with other specified complication: E11.69

## 2012-05-17 MED ORDER — PRAVASTATIN SODIUM 80 MG PO TABS: 80.0000 mg | ORAL_TABLET | Freq: Every evening | ORAL | Status: DC

## 2012-05-17 NOTE — Assessment & Plan Note (Addendum)
We'll check lipid panel.  Was revealed to be elevated Will need to increase Pravachol to 80mg 

## 2012-05-18 ENCOUNTER — Telehealth: Payer: Self-pay | Admitting: *Deleted

## 2012-05-18 NOTE — Telephone Encounter (Signed)
Message copied by Deno Etienne on Tue May 18, 2012  8:51 AM ------      Message from: Gaspar Bidding D      Created: Mon May 17, 2012 11:23 PM       Please call pt and inform her that her cholesterol is still elevated.  Will increase her Pravachol to higher dose.  She can take 2 pills that she has until she is out then pick up her her new Rx.

## 2012-05-18 NOTE — Telephone Encounter (Signed)
Called and informed pt of results and told her that Dr. Berline Chough would like for her to  Take 2 pills until she runs out and then go p/u her new Rx from the pharmacy. Pt voiced understanding and agreed.Loralee Pacas [place]

## 2012-05-19 ENCOUNTER — Ambulatory Visit (HOSPITAL_COMMUNITY): Payer: Self-pay | Admitting: Psychiatry

## 2012-05-19 ENCOUNTER — Ambulatory Visit
Admission: RE | Admit: 2012-05-19 | Discharge: 2012-05-19 | Disposition: A | Discharge status: Home / Self Care | Payer: Medicare Other | Source: Ambulatory Visit | Attending: Family Medicine | Admitting: Family Medicine

## 2012-05-19 DIAGNOSIS — R059 Cough, unspecified: Secondary | ICD-10-CM

## 2012-05-19 DIAGNOSIS — R61 Generalized hyperhidrosis: Secondary | ICD-10-CM | POA: Diagnosis not present

## 2012-05-19 DIAGNOSIS — R0989 Other specified symptoms and signs involving the circulatory and respiratory systems: Secondary | ICD-10-CM | POA: Diagnosis not present

## 2012-05-19 DIAGNOSIS — R05 Cough: Secondary | ICD-10-CM

## 2012-05-24 ENCOUNTER — Ambulatory Visit (INDEPENDENT_AMBULATORY_CARE_PROVIDER_SITE_OTHER): Payer: Medicare Other | Admitting: Sports Medicine

## 2012-05-24 ENCOUNTER — Encounter: Payer: Self-pay | Admitting: Sports Medicine

## 2012-05-24 VITALS — BP 114/81 | HR 66 | Temp 98.2°F | Ht 63.6 in | Wt 165.0 lb

## 2012-05-24 DIAGNOSIS — E782 Mixed hyperlipidemia: Secondary | ICD-10-CM | POA: Diagnosis not present

## 2012-05-24 DIAGNOSIS — F17200 Nicotine dependence, unspecified, uncomplicated: Secondary | ICD-10-CM | POA: Diagnosis not present

## 2012-05-24 DIAGNOSIS — I10 Essential (primary) hypertension: Secondary | ICD-10-CM | POA: Diagnosis not present

## 2012-05-24 DIAGNOSIS — R7301 Impaired fasting glucose: Secondary | ICD-10-CM

## 2012-05-24 DIAGNOSIS — E785 Hyperlipidemia, unspecified: Secondary | ICD-10-CM

## 2012-05-24 NOTE — Progress Notes (Signed)
  [facility]  Patient name: TASHEBA HENSON MRN [id number]  Date of birth: January 12, 1951  CC & HPI  Gatha EILEEN KANGAS is a 61 y.o. female presenting today to follow up her hypertension, and to review results.    Cardiovascular risk analysis - 62 y.o. female LDL goal is under 130.  ROS: taking medications as instructed, no medication side effects noted, no TIA's, no chest pain on exertion, no dyspnea on exertion and no swelling of ankles.  New concerns: none.   Subjective:  KAORU BENDA is a 61 y.o. female with hypertension. Current Outpatient Prescriptions  Medication Sig Dispense Refill  . albuterol (PROAIR HFA) 108 (90 BASE) MCG/ACT inhaler Inhale 2 puffs into the lungs as directed.  1 Inhaler  3  . budesonide-formoterol (SYMBICORT) 80-4.5 MCG/ACT inhaler Inhale 2 puffs into the lungs 2 (two) times daily.  1 Inhaler  3  . ergocalciferol (VITAMIN D2) 50000 UNITS capsule Take 1 capsule (50,000 Units total) by mouth once a week. One capsule once weekly  12 capsule  1  . pantoprazole (PROTONIX) 20 MG tablet Take 1 tablet (20 mg total) by mouth daily.  30 tablet  3  . pravastatin (PRAVACHOL) 80 MG tablet Take 1 tablet (80 mg total) by mouth every evening.  30 tablet  11  . Spacer/Aero-Holding Chambers (AEROCHAMBER MAX WITH MASK- MEDIUM) inhaler Use as instructed  1 each  2  . topiramate (TOPAMAX) 25 MG tablet Take 1 tablet (25 mg total) by mouth 2 (two) times daily.  60 tablet  2  . triamterene-hydrochlorothiazide (MAXZIDE) 75-50 MG per tablet TAKE ONE TABLET BY MOUTH EVERY DAY  30 tablet  3  . buPROPion (WELLBUTRIN XL) 300 MG 24 hr tablet Take 1 tablet (300 mg total) by mouth daily.  30 tablet  1  . cloNIDine (CATAPRES) 0.3 MG tablet Take 1.5 tablets (4.5mg ) every night at bedtime  45 tablet  11  . mirtazapine (REMERON) 30 MG tablet Take 1 tablet (30 mg total) by mouth at bedtime.  30 tablet  1    Hypertension ROS: taking medications as instructed, no  medication side effects noted, no TIA's, no chest pain on exertion, no dyspnea on exertion and no swelling of ankles.  New concerns: none.   ROS  Per HPI  Pertinent History Reviewed  Medical & Surgical Hx:  Reviewed: Significant for significant psychiatric hx  Medications: Reviewed & Updated - see associated section Social History: Reviewed - Significant for current everyday smoker  Objective Findings  Vitals:  Filed Vitals:   05/24/12 1553  BP: 114/81  Pulse: 66  Temp: 98.2 F (36.8 C)    PE: GENERAL:  AA adult female.  Examined in [facility].  In no discomfort; no respiratory distress. PSYCH: Alert and appropriately interactive; Insight:good   H&N: AT/[place], MMM, no scleral icterus, EOMi THORAX: HEART: RRR, S1/S2 heard, no murmur LUNGS: CTA B, no wheezes, no crackles EXTREMITIES: Moves all 4 extremities spontaneously, warm well perfused, no edema, bilateral DP and PT pulses 2/4.     Assessment & Plan

## 2012-05-24 NOTE — Patient Instructions (Addendum)
It was great to see you again today.  I do not want to change any of your medications at this time.  Please follow up in 3 months with me.  Schedule a lab visit 1 week prior to our visit in 3 months.    If you need to see [place] before then please call to make an appointment.

## 2012-05-26 ENCOUNTER — Ambulatory Visit (INDEPENDENT_AMBULATORY_CARE_PROVIDER_SITE_OTHER): Payer: Medicare Other | Admitting: Psychiatry

## 2012-05-26 ENCOUNTER — Encounter (HOSPITAL_COMMUNITY): Payer: Self-pay | Admitting: Psychiatry

## 2012-05-26 DIAGNOSIS — F329 Major depressive disorder, single episode, unspecified: Secondary | ICD-10-CM

## 2012-05-26 MED ORDER — MIRTAZAPINE 30 MG PO TABS: 30.0000 mg | ORAL_TABLET | Freq: Every day | ORAL | Status: DC

## 2012-05-26 MED ORDER — BUPROPION HCL ER (XL) 300 MG PO TB24: 300.0000 mg | ORAL_TABLET | Freq: Every day | ORAL | Status: DC
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
## 2012-06-01 ENCOUNTER — Telehealth: Payer: Self-pay | Admitting: *Deleted

## 2012-06-01 ENCOUNTER — Other Ambulatory Visit: Payer: Self-pay | Admitting: Sports Medicine

## 2012-06-01 NOTE — Telephone Encounter (Signed)
Let her call new PCP please

## 2012-06-01 NOTE — Telephone Encounter (Signed)
Is this still your patient?

## 2012-06-01 NOTE — Telephone Encounter (Signed)
Pt aware.

## 2012-06-01 NOTE — Telephone Encounter (Signed)
Patient is calling wanting to get refill on clonidine for sleep. We received the fax from pharmacy yesterday.  Message given to Dr. Deirdre Priest  since Dr. Berline Chough is out of office.and he advises that patient needs to get this from her psychiatrist. Patient notified and she states " no , I get this from my primary doctor. " Again advised that this is Dr. Deirdre Priest' recommendation and he will be unable to prescribe.

## 2012-06-02 NOTE — Telephone Encounter (Signed)
Patient is calling because she needs a refill on Clonidine sent to [facility] on [address].

## 2012-06-03 ENCOUNTER — Other Ambulatory Visit: Payer: Self-pay | Admitting: Sports Medicine

## 2012-06-03 NOTE — Telephone Encounter (Signed)
Pt is asking for refill on her cloNIDine (CATAPRES) 0.3 MG tablet  It looks like it was discontinued in error and she is upset that the pharmacy will not fill it.  Needs to know if this can be filled asap.  [facility] [address]

## 2012-06-04 ENCOUNTER — Other Ambulatory Visit: Payer: Self-pay | Admitting: Family Medicine

## 2012-06-04 NOTE — Telephone Encounter (Signed)
Patient is calling because she has been trying to get this refill since last week.  She hasn't had any sleep since then.

## 2012-06-04 NOTE — Telephone Encounter (Signed)
RN spoke with Dr. Deirdre Priest about this 08/06 ( see note ) . Will forward to Dr. Berline Chough .

## 2012-06-07 MED ORDER — CLONIDINE HCL 0.3 MG PO TABS: ORAL_TABLET | ORAL | Status: DC

## 2012-06-07 NOTE — Assessment & Plan Note (Signed)
We'll check baseline labs very well controlled today. Consider beta blocker if continues to migraines

## 2012-06-07 NOTE — Assessment & Plan Note (Signed)
Encouraged patient to quit 

## 2012-06-07 NOTE — Progress Notes (Signed)
  [facility]  Patient name: [name] MRN [id number]  Date of birth: Jul 12, 1951  CC & HPI:  [name] is a 61 y.o. female presenting today to establish care.  She has recently moved to [place] from refill.  She reports some light burning as well as a cough at night x3 days.  She has been using albuterol at least 3 times a day for this.  She has migraines that have been going on starting approximately 15 years ago.  They have now returned.  She denies any chest pain, shortness of breath, difficult  with ambulation, difficulty  sleeping but does wear CPAP, orthopnea, or chronic dyspnea.  She has been followed by Dr. Lodema Hong but is establishing care today.   ROS:  Per history of present illness  Pertinent History Reviewed:  Medical & Surgical Hx:  Reviewed: Significant for anxiety and depression GERD, migraines, sleep apnea, hyperlipidemia and hypertension Medications: Reviewed & Updated - see associated section Social History: Reviewed - Significant for smoker  Objective Findings:  Vitals:  Filed Vitals:   05/07/12 1526  BP: 108/74  Pulse: 66  Temp: 97.5 F (36.4 C)    PE: GENERAL:  adult female.  Examined in [facility].  In no discomfort; no respiratory distress. PSYCH: Alert and appropriately interactive; Insight:Good   H&N: AT/[place], MMM, no scleral icterus, EOMi THORAX: HEART: RRR, S1/S2 heard, no murmur LUNGS: CTA B, no wheezes, no crackles EXTREMITIES: Moves all 4 extremities spontaneously, warm well perfused, no edema, bilateral DP and PT pulses 2/4.      Assessment & Plan:

## 2012-06-07 NOTE — Assessment & Plan Note (Signed)
Chronic cough currently.  Will obtain chest x-ray to evaluate.  Will also start on spacer for inhaled corticosteroid

## 2012-06-09 ENCOUNTER — Encounter: Payer: Self-pay | Admitting: Sports Medicine

## 2012-06-09 ENCOUNTER — Ambulatory Visit (INDEPENDENT_AMBULATORY_CARE_PROVIDER_SITE_OTHER): Payer: Medicare Other | Admitting: Sports Medicine

## 2012-06-09 VITALS — BP 148/92 | HR 88 | Temp 98.0°F | Ht 63.5 in | Wt 160.7 lb

## 2012-06-09 DIAGNOSIS — I10 Essential (primary) hypertension: Secondary | ICD-10-CM

## 2012-06-09 DIAGNOSIS — G479 Sleep disorder, unspecified: Secondary | ICD-10-CM | POA: Diagnosis not present

## 2012-06-09 DIAGNOSIS — Z0000 Encounter for general adult medical examination without abnormal findings: Secondary | ICD-10-CM | POA: Diagnosis not present

## 2012-06-09 DIAGNOSIS — E559 Vitamin D deficiency, unspecified: Secondary | ICD-10-CM | POA: Diagnosis not present

## 2012-06-13 DIAGNOSIS — G479 Sleep disorder, unspecified: Secondary | ICD-10-CM | POA: Insufficient documentation

## 2012-06-13 DIAGNOSIS — E559 Vitamin D deficiency, unspecified: Secondary | ICD-10-CM | POA: Insufficient documentation

## 2012-06-13 DIAGNOSIS — Z0000 Encounter for general adult medical examination without abnormal findings: Secondary | ICD-10-CM | POA: Insufficient documentation

## 2012-06-13 NOTE — Assessment & Plan Note (Signed)
Recheck following 5 months of Supplementation

## 2012-06-13 NOTE — Assessment & Plan Note (Signed)
Stable/Well Controlled - no changes at this time. Clonidine for both HTN and sleep

## 2012-06-13 NOTE — Assessment & Plan Note (Signed)
Clonidine for both HTN and sleep.  Stable/Well Controlled - no changes at this time.

## 2012-06-13 NOTE — Assessment & Plan Note (Signed)
Recheck in 3 months.

## 2012-06-13 NOTE — Assessment & Plan Note (Signed)
Stable/Well Controlled - no changes at this time. 

## 2012-06-13 NOTE — Assessment & Plan Note (Deleted)
Recheck Labs in 3 months

## 2012-06-13 NOTE — Assessment & Plan Note (Addendum)
Will screen for Hepatitis C per pt request. Reports up coming Medicare Yearly VISIT

## 2012-06-13 NOTE — Progress Notes (Signed)
  [facility]  Patient name: [name] MRN [id number]  Date of birth: 1951/06/30  CC & HPI:  [name] is a 61 y.o. female presenting today for disucssion regarding her clonidine.    Pt reports clonidine was initially used for sleep onset and is well tolerated.  No side effects.  Due to HTN pt able to tolerate large dose.  Denies orthostasis or presyncope.  Increased insomnia without meds over past 5 day  ROS:  Per HPI  Social History: Reviewed - Significant for everyday smoker  Objective Findings:  Vitals:  Filed Vitals:   06/09/12 1135  BP: 148/92  Pulse: 88  Temp: 98 F (36.7 C)    PE:  GENERAL:  AA adult female.  Examined in [facility].  In no discomfort; no respiratory distress. PSYCH: Alert and appropriately interactive; Insight:good   H&N: AT/[place], MMM, no scleral icterus, EOMi EXTREMITIES: Moves all 4 extremities spontaneously, warm well perfused, no edema,   Assessment & Plan:

## 2012-06-16 ENCOUNTER — Encounter: Payer: Self-pay | Admitting: Home Health Services

## 2012-06-23 ENCOUNTER — Encounter: Payer: Self-pay | Admitting: Home Health Services

## 2012-07-28 ENCOUNTER — Ambulatory Visit (INDEPENDENT_AMBULATORY_CARE_PROVIDER_SITE_OTHER): Payer: Medicare Other | Admitting: Psychiatry

## 2012-07-28 ENCOUNTER — Encounter (HOSPITAL_COMMUNITY): Payer: Self-pay | Admitting: Psychiatry

## 2012-07-28 DIAGNOSIS — F329 Major depressive disorder, single episode, unspecified: Secondary | ICD-10-CM

## 2012-07-28 MED ORDER — BUPROPION HCL ER (XL) 300 MG PO TB24: 300.0000 mg | ORAL_TABLET | Freq: Every day | ORAL | Status: DC

## 2012-07-28 MED ORDER — MIRTAZAPINE 30 MG PO TABS: 30.0000 mg | ORAL_TABLET | Freq: Every day | ORAL | Status: DC
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
## 2012-07-29 ENCOUNTER — Telehealth: Payer: Self-pay | Admitting: Sports Medicine

## 2012-07-29 NOTE — Telephone Encounter (Signed)
Clarified the cholesterol med for pt and the need for a F/U appt at end of month,Pt voiced understanding.

## 2012-07-29 NOTE — Telephone Encounter (Signed)
Patient is calling to clarify the changes in her medications from her appt yesterday.

## 2012-08-11 IMAGING — CT CT ABD-PELV W/ CM
1 of 3 series · 14 of 32 positions shown, 19 images · IV contrast (100 ML OMNI 300)
Comparison: 08/23/2010

CLINICAL DATA: Abdominal pain, nausea and vomiting.

CT ABDOMEN AND PELVIS WITH CONTRAST
TECHNIQUE: Multidetector CT imaging of the abdomen and pelvis was
performed following the standard protocol during bolus
administration of intravenous contrast.
Contrast: 100mL OMNIPAQUE IOHEXOL 300 MG/ML  SOLN

[Series 2: abd/pel with · axial · 0.70mm/px · z∈[-14,+326]mm · 14 of 76 slices shown, 19 images]
[im 4/76  soft-tissue]
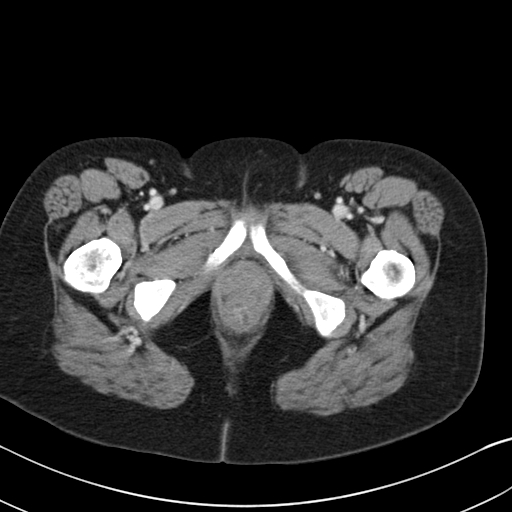
[im 4/76  bone]
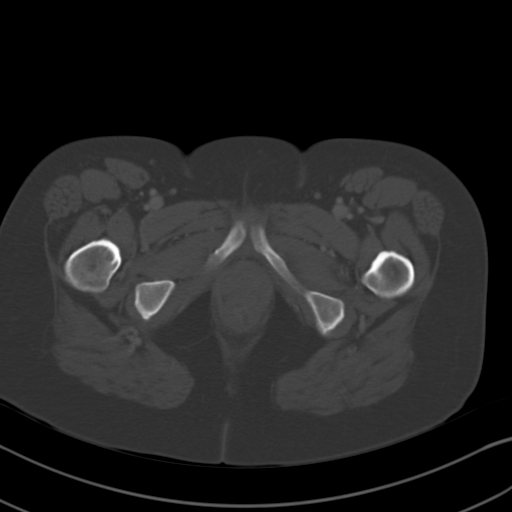
[im 12/76  soft-tissue]
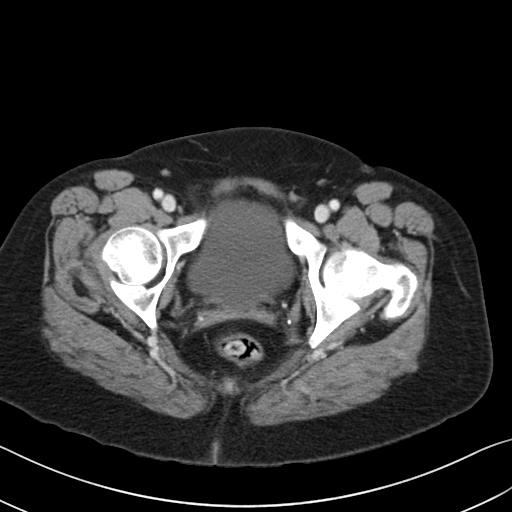
[im 16/76  soft-tissue]
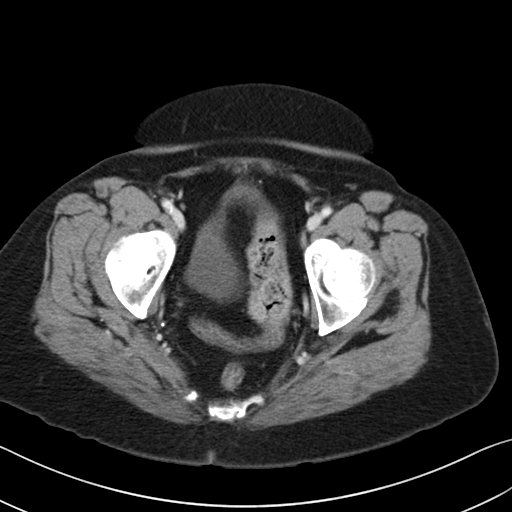
[im 20/76  soft-tissue]
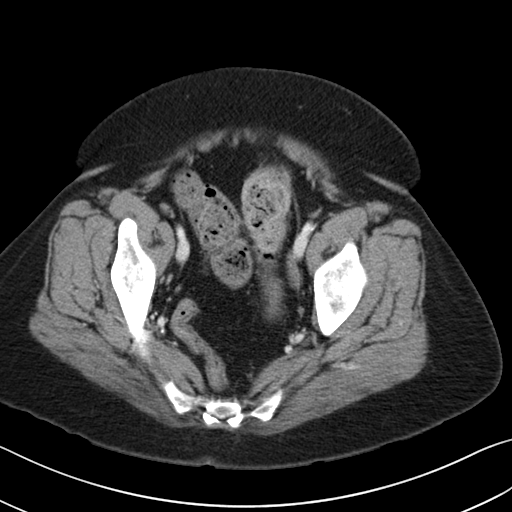
[im 28/76  soft-tissue]
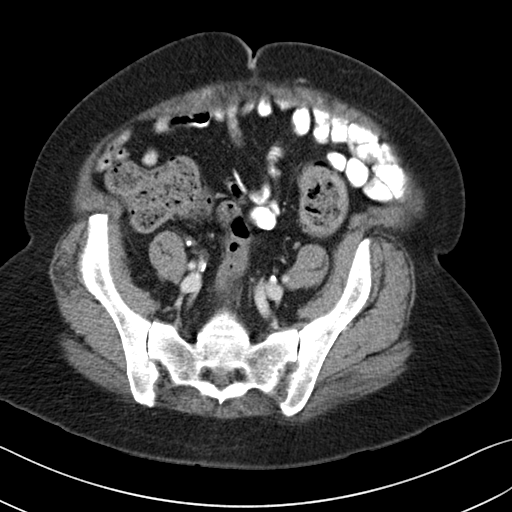
[im 32/76  soft-tissue]
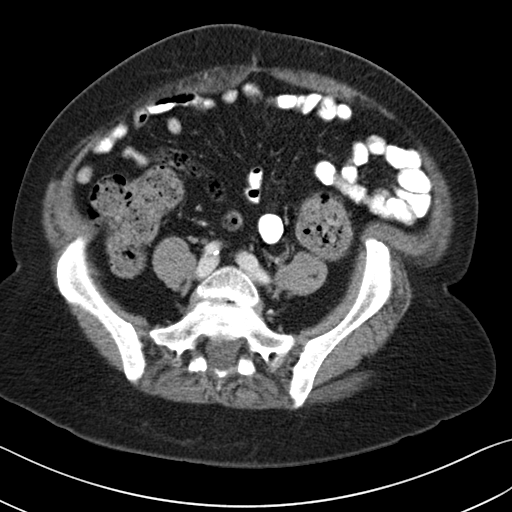
[im 40/76  soft-tissue]
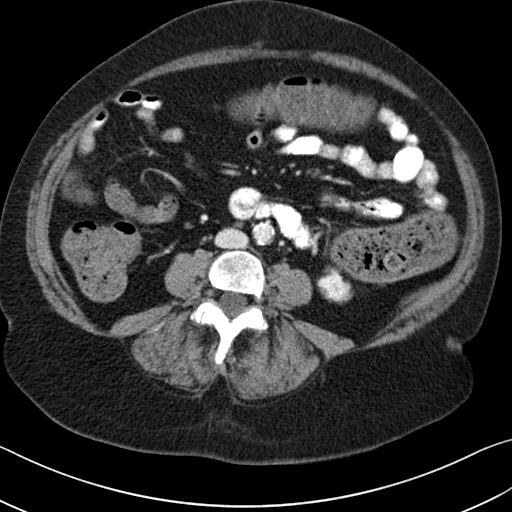
[im 44/76  soft-tissue]
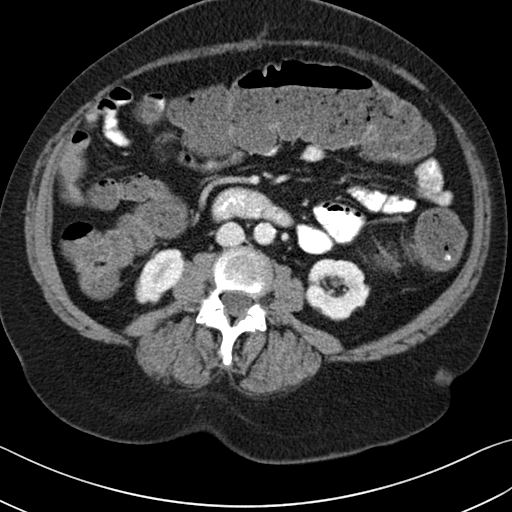
[im 48/76  soft-tissue]
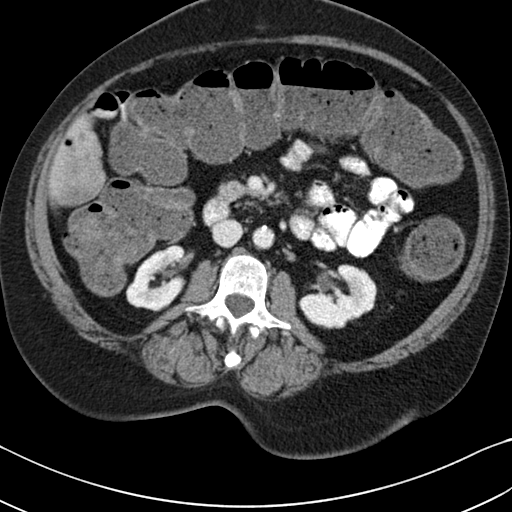
[im 48/76  bone]
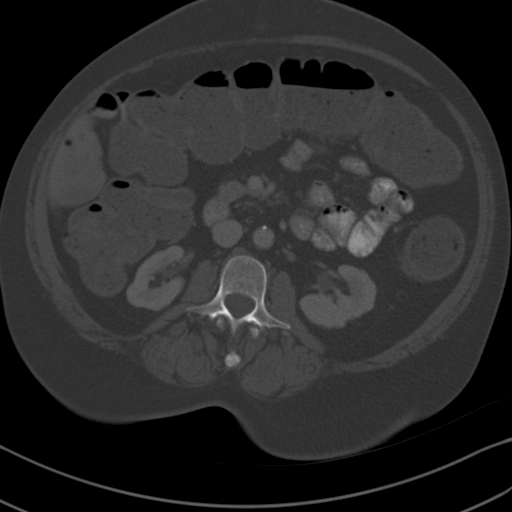
[im 56/76  soft-tissue]
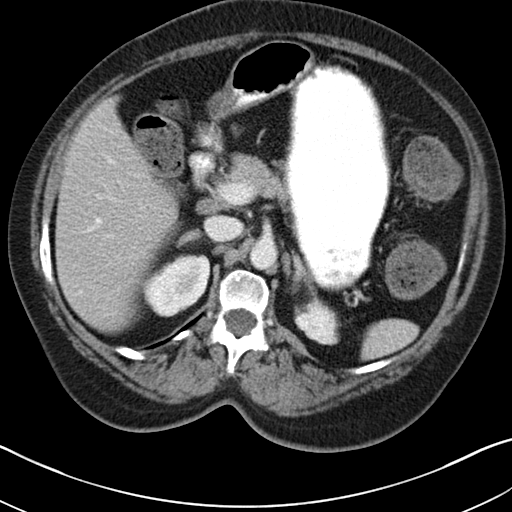
[im 60/76  soft-tissue]
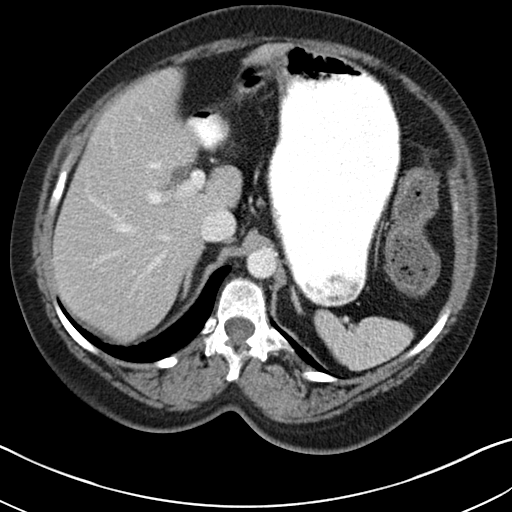
[im 60/76  lung]
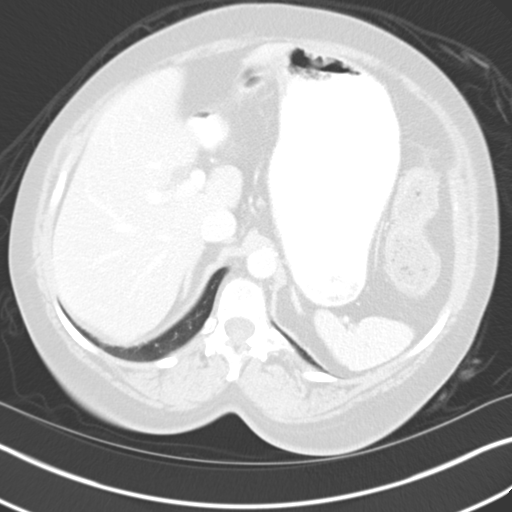
[im 64/76  soft-tissue]
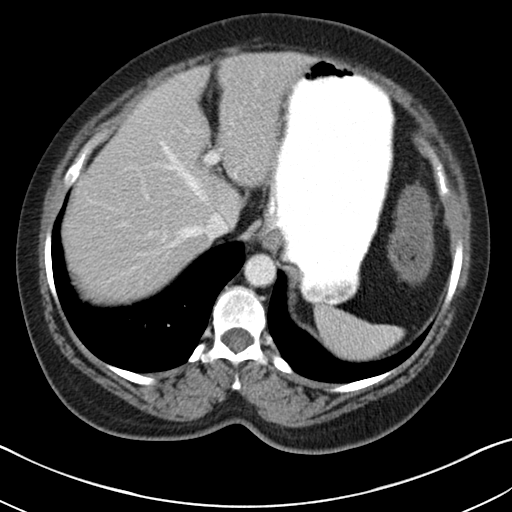
[im 64/76  lung]
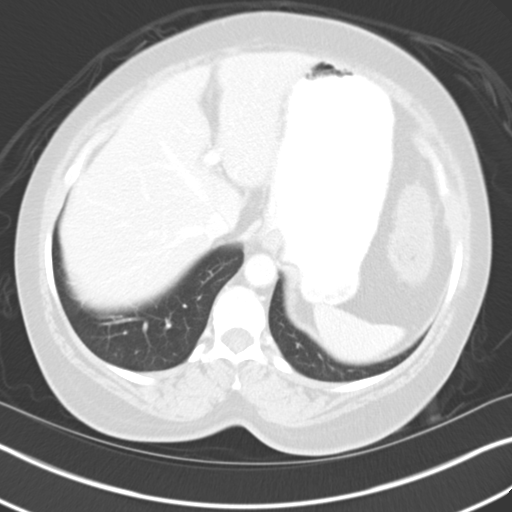
[im 68/76  lung]
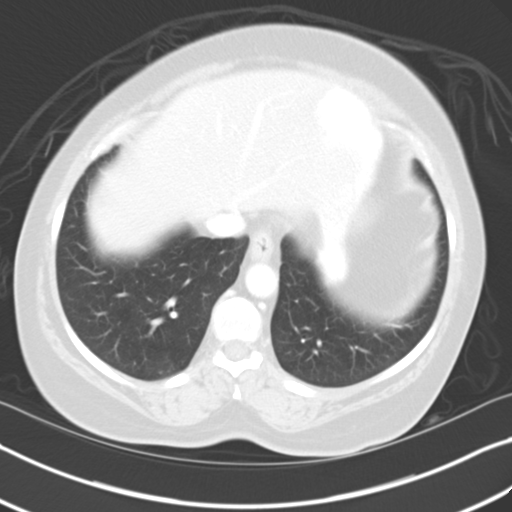
[im 72/76  soft-tissue]
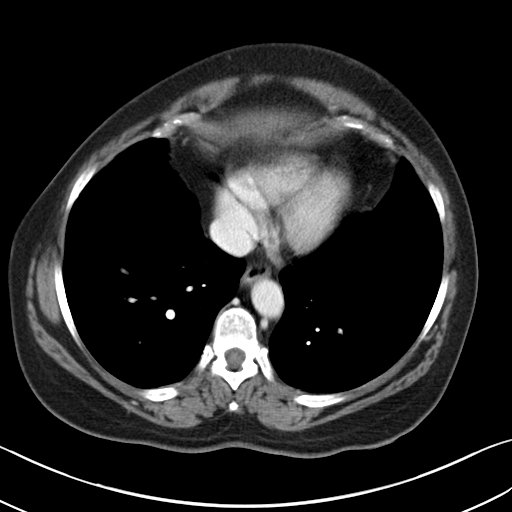
[im 72/76  lung]
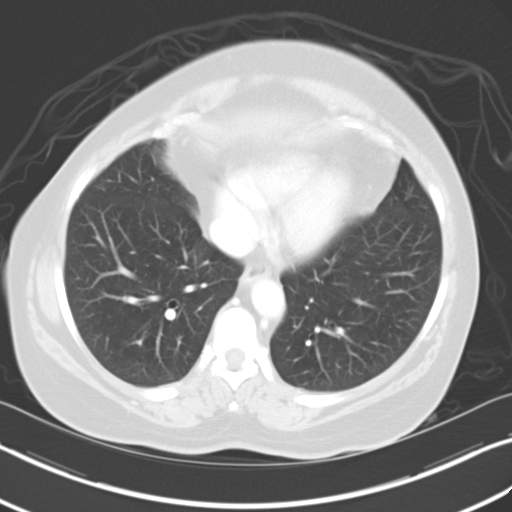

[14 of 32 positions shown; findings below may reference images not displayed]

FINDINGS: There is a large amount of fecal material throughout the
colon with relative sparing of the distal sigmoid and rectum.  Some
fecalization of contents also present at the level of the distal
ileum.  Other small bowel loops are of normal caliber.  No obvious
obstructing colonic lesion is identified by CT.  No evidence of
bowel perforation, free fluid, abscess, hernia or enlarged lymph
nodes.

Stable 4 mm noncalcified lung nodule at the posterior left lung
base.  The liver, pancreas, spleen and adrenal glands are within
normal limits.  The gallbladder has been removed.  There is no
evidence of biliary dilatation.  Stable cortical scarring of
bilateral kidneys with a lobulated contour of both renal cortices
of the.  No evidence of hydronephrosis or focal renal lesions.  The
bladder is unremarkable.

Stable evidence of anterolisthesis of L5 on S1 with associated
degenerative disc disease.  There are probable bilateral pars
defects at L5.
IMPRESSION: 1.  Evidence of significant constipation with a large amount of
fecal material present throughout the colon and some fecalization
of distal small bowel enteric contents.  No evidence of small bowel
obstruction.  No obvious evidence of obstructing colonic lesion.
2.  Stable 4 mm left lower lobe lung nodule.
3.  Stable anterolisthesis of L5 on S1 with associated spondylosis
and spondylolysis.

## 2012-08-30 ENCOUNTER — Ambulatory Visit (HOSPITAL_COMMUNITY): Payer: Self-pay | Admitting: Licensed Clinical Social Worker

## 2012-09-08 ENCOUNTER — Ambulatory Visit (INDEPENDENT_AMBULATORY_CARE_PROVIDER_SITE_OTHER): Payer: Medicare Other | Admitting: Licensed Clinical Social Worker

## 2012-09-08 ENCOUNTER — Encounter (HOSPITAL_COMMUNITY): Payer: Self-pay | Admitting: Licensed Clinical Social Worker

## 2012-09-08 DIAGNOSIS — F332 Major depressive disorder, recurrent severe without psychotic features: Secondary | ICD-10-CM | POA: Diagnosis not present
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
## 2012-09-13 ENCOUNTER — Ambulatory Visit (INDEPENDENT_AMBULATORY_CARE_PROVIDER_SITE_OTHER): Payer: Medicare Other | Admitting: Sports Medicine

## 2012-09-13 ENCOUNTER — Encounter: Payer: Self-pay | Admitting: Sports Medicine

## 2012-09-13 VITALS — BP 151/89 | HR 63 | Temp 98.1°F | Ht 63.5 in | Wt 167.8 lb

## 2012-09-13 DIAGNOSIS — L988 Other specified disorders of the skin and subcutaneous tissue: Secondary | ICD-10-CM | POA: Diagnosis not present

## 2012-09-13 DIAGNOSIS — R238 Other skin changes: Secondary | ICD-10-CM

## 2012-09-13 DIAGNOSIS — E782 Mixed hyperlipidemia: Secondary | ICD-10-CM | POA: Diagnosis not present

## 2012-09-13 MED ORDER — TRIAMCINOLONE ACETONIDE 0.1 % EX CREA: TOPICAL_CREAM | Freq: Two times a day (BID) | CUTANEOUS | Status: DC

## 2012-09-13 NOTE — Assessment & Plan Note (Signed)
rx med dose topical steroid, with occlusive dressing F/u in 1 week >consider punch biopsy if not significantly improved in 1 week

## 2012-09-13 NOTE — Patient Instructions (Addendum)
It was nice to see you today.      Today we discussed:  Mixed hypercholesterolemia and hypertriglyceridemia  We are checking your cholesterol, we will discuss at your next visit Vesicular skin lesions  Try - triamcinolone cream (KENALOG) 0.1 %; Apply topically 2 (two) times daily.    You  Can continue to cover this with a regular large bandaid  Please plan to return to see me in 1 week.  If you need anything prior to seeing me please call the clinic.

## 2012-09-13 NOTE — Progress Notes (Signed)
  [facility]  Patient name: [name] MRN [id number]  Date of birth: 1951-02-22  CC & HPI:  [name] is a 61 y.o. female presenting today for evaluation of skin lesion over the dorsum of L wrist.  She reports lesion has been present for 2 weeks and is progressively worsening.  Exudative, OTC antibacterial ointments used, no steroids.  No new contact in the area, no new watches or jewlery.  Arm more proximally has been swollen. But currently improved  ROS:  No fevers but some chills, no other acute changes in health  Pertinent History Reviewed:  Medical & Surgical Hx:  Reviewed: Significant for depression, HTN, Back Pain, tobacco abuse Medications: Reviewed & Updated - see associated section Social History: Reviewed -  reports that she has been smoking Cigarettes.  She has a 10.5 pack-year smoking history. She does not have any smokeless tobacco history on file.   Objective Findings:  Vitals:  Filed Vitals:   09/13/12 1355  BP: 151/89  Pulse: 63  Temp: 98.1 F (36.7 C)    PE: GENERAL:  Adult AA female. In no discomfort; no respiratory distress. LYMPH: no axillary or trochlear lymphadenopathy Skin: lesion on dorsum of L wrist.  Isolated lesion, no other acute skin findings.  Vesicular, weeping, ~3cm in diameter with area of eschar in central region     Assessment & Plan:

## 2012-09-14 ENCOUNTER — Other Ambulatory Visit: Payer: Self-pay | Admitting: Sports Medicine

## 2012-09-14 DIAGNOSIS — R238 Other skin changes: Secondary | ICD-10-CM

## 2012-09-14 DIAGNOSIS — E7800 Pure hypercholesterolemia, unspecified: Secondary | ICD-10-CM

## 2012-09-14 DIAGNOSIS — K219 Gastro-esophageal reflux disease without esophagitis: Secondary | ICD-10-CM

## 2012-09-14 DIAGNOSIS — F329 Major depressive disorder, single episode, unspecified: Secondary | ICD-10-CM

## 2012-09-14 DIAGNOSIS — E785 Hyperlipidemia, unspecified: Secondary | ICD-10-CM

## 2012-09-14 MED ORDER — CLONIDINE HCL 0.3 MG PO TABS: ORAL_TABLET | ORAL | Status: DC

## 2012-09-14 MED ORDER — BUDESONIDE-FORMOTEROL FUMARATE 80-4.5 MCG/ACT IN AERO: 2.0000 | INHALATION_SPRAY | Freq: Two times a day (BID) | RESPIRATORY_TRACT | Status: DC

## 2012-09-14 MED ORDER — TRIAMTERENE-HCTZ 75-50 MG PO TABS: ORAL_TABLET | ORAL | Status: DC

## 2012-09-14 MED ORDER — MIRTAZAPINE 30 MG PO TABS: 30.0000 mg | ORAL_TABLET | Freq: Every day | ORAL | Status: DC

## 2012-09-14 MED ORDER — BUPROPION HCL ER (XL) 300 MG PO TB24: 300.0000 mg | ORAL_TABLET | Freq: Every day | ORAL | Status: DC

## 2012-09-14 MED ORDER — ERGOCALCIFEROL 1.25 MG (50000 UT) PO CAPS: 50000.0000 [IU] | ORAL_CAPSULE | ORAL | Status: DC

## 2012-09-14 MED ORDER — TRIAMCINOLONE ACETONIDE 0.1 % EX CREA: TOPICAL_CREAM | Freq: Two times a day (BID) | CUTANEOUS | Status: DC

## 2012-09-14 MED ORDER — AEROCHAMBER MAX W/MASK MEDIUM MISC: Status: DC

## 2012-09-14 MED ORDER — TOPIRAMATE 25 MG PO TABS: 25.0000 mg | ORAL_TABLET | Freq: Two times a day (BID) | ORAL | Status: DC

## 2012-09-14 MED ORDER — ALBUTEROL SULFATE HFA 108 (90 BASE) MCG/ACT IN AERS: 2.0000 | INHALATION_SPRAY | RESPIRATORY_TRACT | Status: DC

## 2012-09-14 MED ORDER — PRAVASTATIN SODIUM 80 MG PO TABS: 80.0000 mg | ORAL_TABLET | Freq: Every evening | ORAL | Status: DC

## 2012-09-14 MED ORDER — PANTOPRAZOLE SODIUM 20 MG PO TBEC: 20.0000 mg | DELAYED_RELEASE_TABLET | Freq: Every day | ORAL | Status: DC

## 2012-09-16 ENCOUNTER — Telehealth (HOSPITAL_COMMUNITY): Payer: Self-pay | Admitting: *Deleted

## 2012-09-16 ENCOUNTER — Encounter: Payer: Self-pay | Admitting: Sports Medicine

## 2012-09-16 ENCOUNTER — Other Ambulatory Visit (HOSPITAL_COMMUNITY): Payer: Self-pay | Admitting: Psychiatry

## 2012-09-16 NOTE — Telephone Encounter (Signed)
Received faxes from [facility] for Topamax and for Wellbutrin ( on fax for Remeron).  Contacted [facility] at [phone]. Topamax was refilled by Dr.Rigby PCP. Dr.Rigby entered prescribing information regarding Remeron and Wellbutrin as medication order. Remeron and Wellbutrin ordered by Dr.Arfeen on 07/28/12 for #30 day supply with 1 refill. Patient should have enough medication to last from these RX until end of November These RX were sent to [id number] - [place], [place] - [address]. Pharmacist at [facility] states he will contact this [facility] and [facility] these prescriptions. No refills needed at this time

## 2012-09-20 ENCOUNTER — Telehealth: Payer: Self-pay | Admitting: Sports Medicine

## 2012-09-20 ENCOUNTER — Encounter: Payer: Self-pay | Admitting: Sports Medicine

## 2012-09-20 ENCOUNTER — Ambulatory Visit (INDEPENDENT_AMBULATORY_CARE_PROVIDER_SITE_OTHER): Payer: Medicare Other | Admitting: Sports Medicine

## 2012-09-20 VITALS — BP 117/79 | HR 65 | Temp 98.2°F | Ht 63.5 in | Wt 163.3 lb

## 2012-09-20 DIAGNOSIS — J4489 Other specified chronic obstructive pulmonary disease: Secondary | ICD-10-CM

## 2012-09-20 DIAGNOSIS — L988 Other specified disorders of the skin and subcutaneous tissue: Secondary | ICD-10-CM

## 2012-09-20 DIAGNOSIS — J449 Chronic obstructive pulmonary disease, unspecified: Secondary | ICD-10-CM | POA: Diagnosis not present

## 2012-09-20 DIAGNOSIS — R238 Other skin changes: Secondary | ICD-10-CM

## 2012-09-20 DIAGNOSIS — E782 Mixed hyperlipidemia: Secondary | ICD-10-CM | POA: Diagnosis not present

## 2012-09-20 DIAGNOSIS — J069 Acute upper respiratory infection, unspecified: Secondary | ICD-10-CM

## 2012-09-20 MED ORDER — FLUTICASONE PROPIONATE 50 MCG/ACT NA SUSP: 2.0000 | Freq: Every day | NASAL | Status: DC

## 2012-09-20 MED ORDER — BUDESONIDE-FORMOTEROL FUMARATE 80-4.5 MCG/ACT IN AERO: 2.0000 | INHALATION_SPRAY | Freq: Two times a day (BID) | RESPIRATORY_TRACT | Status: DC

## 2012-09-20 MED ORDER — ATORVASTATIN CALCIUM 40 MG PO TABS: 40.0000 mg | ORAL_TABLET | Freq: Every day | ORAL | Status: DC

## 2012-09-20 NOTE — Telephone Encounter (Signed)
Pt calling requesting for [place] to fill out her vouchers for transportation 8/28, 11/18,11/25. Pt stated that she is going to mail these to our office.[name] [place]

## 2012-09-20 NOTE — Progress Notes (Signed)
  [facility]  Patient name: [name] MRN [id number]  Date of birth: 1951-02-02  CC & HPI:  BRIANNIE LAGOW is a 61 y.o. female presenting today for:  # follow up of L wrist wound:  Significantly improved, not itching, not using dressing currently  # URI - reports  5 day history of cough worse at night and nasal congestion, no wheezing, no difficulty breathing, smoking less due to discomfort.    # HLD:  Direct LDL obtained at last visit.  Still elevated   ROS:  No chest pain, disordered sleep with congestion, no CVA/TIA symptoms  Pertinent History Reviewed:  Medical & Surgical Hx:  Reviewed: Significant for COPD, HTN, hx of Migraine Medications: Reviewed & Updated - see associated section Social History: Reviewed -  reports that she has been smoking Cigarettes.  She has a 10.5 pack-year smoking history. She does not have any smokeless tobacco history on file.  Objective Findings:  Vitals:  Filed Vitals:   09/20/12 1347  BP: 117/79  Pulse: 65  Temp: 98.2 F (36.8 C)    PE: GENERAL:  Adult AA female. In no discomfort; no respiratory distress. PSYCH: Alert and appropriately interactive; Insight:Good   H&N: AT/[place], trachea midline, nasal polyps B, nasal congestion with exudate, oropharynx moderately erythematous, no exudate, no tonsillar enlargement.   No cervical lymphadenopathy EENT:  MMM, no scleral icterus, EOMi HEART: RRR, S1/S2 heard, no murmur LUNGS: CTA B, no wheezes, no crackles EXTREMITIES: Moves all 4 extremities spontaneously, warm well perfused, no edema, bilateral DP and PT pulses 2/4.   SKIN: significantly improved lesion on left wrist, some post-inflammatory hyperpigmentation, no vesicles, not weeping    Assessment & Plan:

## 2012-09-20 NOTE — Patient Instructions (Addendum)
It was nice to see you today.   Today we discussed: 1. Vesicular skin lesions  Keep using your steroid X 1 week  2. URI (upper respiratory infection)  Try Tylenol & Motrin  Add honey to your tea at night  Use your inhalers  Pick up:   - fluticasone (FLONASE) 50 MCG/ACT nasal spray; Place 2 sprays into the nose daily.  Dispense: 16 g; Refill: 6  3. COPD (chronic obstructive pulmonary disease)  I refilled your Symbicort - please pick this [place] and use a spacer with it  4. Mixed hypercholesterolemia and hypertriglyceridemia  I have increased your cholesterol medicine to Lipitor 40.   Follow up in 2-3 months.    Please plan to return to see me in 2-3 months.  If you need anything prior to seeing me please call the clinic.  Please Bring all medications with you to each appointment.

## 2012-09-20 NOTE — Assessment & Plan Note (Addendum)
Viral URI like syptoms in setting of COPD, No consolidations on exam Will defer ABX for now Strict instructions for follow up or calling if worsening symptoms Nasal polyp on exam - Rx for flonase

## 2012-09-20 NOTE — Assessment & Plan Note (Signed)
Still not controlled on prava 80 Change to Lipitor F/u in 2-3 months

## 2012-09-20 NOTE — Assessment & Plan Note (Signed)
Resend symbicort

## 2012-09-20 NOTE — Assessment & Plan Note (Signed)
Significantly improved  Continue steroid for 1 additional week then moisturizing cream > consider biopsy if recurrent

## 2012-09-27 ENCOUNTER — Encounter (HOSPITAL_COMMUNITY): Payer: Self-pay | Admitting: Psychiatry

## 2012-09-27 ENCOUNTER — Ambulatory Visit (INDEPENDENT_AMBULATORY_CARE_PROVIDER_SITE_OTHER): Payer: Medicare Other | Admitting: Psychiatry

## 2012-09-27 ENCOUNTER — Ambulatory Visit: Payer: Self-pay | Admitting: Sports Medicine

## 2012-09-27 VITALS — BP 121/81 | HR 70 | Wt 164.0 lb

## 2012-09-27 DIAGNOSIS — F329 Major depressive disorder, single episode, unspecified: Secondary | ICD-10-CM

## 2012-09-27 MED ORDER — MIRTAZAPINE 30 MG PO TABS: 45.0000 mg | ORAL_TABLET | Freq: Every day | ORAL | Status: DC

## 2012-09-27 MED ORDER — BUPROPION HCL ER (XL) 300 MG PO TB24: 300.0000 mg | ORAL_TABLET | Freq: Every day | ORAL | Status: DC
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
## 2012-09-28 MED ORDER — MIRTAZAPINE 45 MG PO TABS: 45.0000 mg | ORAL_TABLET | Freq: Every day | ORAL | Status: DC

## 2012-09-28 MED ORDER — BUPROPION HCL ER (XL) 300 MG PO TB24: 300.0000 mg | ORAL_TABLET | Freq: Every day | ORAL | Status: DC

## 2012-09-28 NOTE — Addendum Note (Signed)
Addended by: Kathryne Sharper T on: 09/28/2012 08:52 AM   Modules accepted: Orders

## 2012-09-29 ENCOUNTER — Encounter (HOSPITAL_COMMUNITY): Payer: Self-pay | Admitting: Licensed Clinical Social Worker

## 2012-09-29 ENCOUNTER — Ambulatory Visit (INDEPENDENT_AMBULATORY_CARE_PROVIDER_SITE_OTHER): Payer: Medicare Other | Admitting: Licensed Clinical Social Worker

## 2012-09-29 DIAGNOSIS — F332 Major depressive disorder, recurrent severe without psychotic features: Secondary | ICD-10-CM | POA: Diagnosis not present

## 2012-09-29 NOTE — Progress Notes (Signed)
   THERAPIST PROGRESS NOTE  Session Time: 11:30am-12:20pm  Participation Level: Active  Behavioral Response: Well GroomedAlertDepressed  Type of Therapy: Individual Therapy  Treatment Goals addressed: Coping  Interventions: Strength-based, Supportive and Reframing  Summary: [name] is a 61 y.o. female who presents with depressed mood and irritable affect. She reports feeling stress related to her children and describes recent conflict between her children and conflict involving her two older children resenting her youngest daughter. She is irritated and confused by their behavior, but has been working hard to separate herself so she is not bothered too much by it. She remains frustrated about her apartment as her landlord is not fixing problems she has reported. She endorses depression this time of year at the holidays since her mothers and aunts deaths. She remains involved at church and is interacting with others and exercising. Her sleep has improved and her appetite is wnl.    Suicidal/Homicidal: Nowithout intent/plan  Therapist Response: Assessed patients current functioning and reviewed progress. Reviewed coping strategies. Assessed patients safety and assisted in identifying protective factors.  Reviewed crisis plan with patient. Assisted patient with the expression of her feelings of frustration with her children. Used CBT to assist patient with the identification of negative distortions and irrational thoughts. Encouraged patient to verbalize alternative and factual responses which challenge thought distortions. Used motivational interviewing to assist and encourage patient through the change process. Explored patients barriers to change. Reviewed patients self care plan. Assessed  progress related to self care. Patient's self care is good. Recommend proper diet, regular exercise, socialization and recreation. Discussed healthy boundaries and assertive communication.    Plan: Return again in four weeks.  Diagnosis: Axis I: Major Depression, Recurrent severe    Axis II: No diagnosis    Luma Clopper, LCSW 09/29/2012

## 2012-10-01 ENCOUNTER — Telehealth (HOSPITAL_COMMUNITY): Payer: Self-pay

## 2012-10-01 NOTE — Telephone Encounter (Signed)
2:13pm 10/01/12 Called and left msg for pt  to call and make a f/u appt for 4wks with therapist./sh

## 2012-10-28 ENCOUNTER — Other Ambulatory Visit (HOSPITAL_COMMUNITY): Payer: Self-pay | Admitting: Psychiatry

## 2012-10-29 ENCOUNTER — Other Ambulatory Visit (HOSPITAL_COMMUNITY): Payer: Self-pay | Admitting: Psychiatry

## 2012-10-29 DIAGNOSIS — F329 Major depressive disorder, single episode, unspecified: Secondary | ICD-10-CM

## 2012-10-29 MED ORDER — MIRTAZAPINE 45 MG PO TABS: 45.0000 mg | ORAL_TABLET | Freq: Every day | ORAL | Status: DC

## 2012-11-01 ENCOUNTER — Other Ambulatory Visit (HOSPITAL_COMMUNITY): Payer: Self-pay | Admitting: *Deleted

## 2012-11-01 NOTE — Telephone Encounter (Signed)
Dosage of Remeron increased 09/27/12 appt. Wrote this information on fax, and asked that RX from 09/27/12 be used for refill

## 2012-11-04 ENCOUNTER — Encounter (HOSPITAL_COMMUNITY): Payer: Self-pay | Admitting: Psychiatry

## 2012-11-04 ENCOUNTER — Ambulatory Visit (INDEPENDENT_AMBULATORY_CARE_PROVIDER_SITE_OTHER): Payer: Medicare Other | Admitting: Psychiatry

## 2012-11-04 DIAGNOSIS — F329 Major depressive disorder, single episode, unspecified: Secondary | ICD-10-CM

## 2012-11-04 MED ORDER — BUPROPION HCL ER (XL) 300 MG PO TB24: 300.0000 mg | ORAL_TABLET | Freq: Every day | ORAL | Status: DC

## 2012-11-04 MED ORDER — MIRTAZAPINE 30 MG PO TABS: 30.0000 mg | ORAL_TABLET | Freq: Every day | ORAL | Status: DC
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
## 2012-11-10 ENCOUNTER — Ambulatory Visit (HOSPITAL_COMMUNITY): Payer: Self-pay | Admitting: Licensed Clinical Social Worker

## 2012-11-12 ENCOUNTER — Ambulatory Visit: Payer: Self-pay | Admitting: Sports Medicine

## 2012-11-27 IMAGING — CR DG CHEST 2V
2 series · 2 of 2 positions shown · non-contrast
Comparison: None.

CLINICAL DATA: 61-year-old female with cough congestion fever and
night sweats.  History of TB contact.

CHEST - 2 VIEW

[view not recorded (1 of 2)]
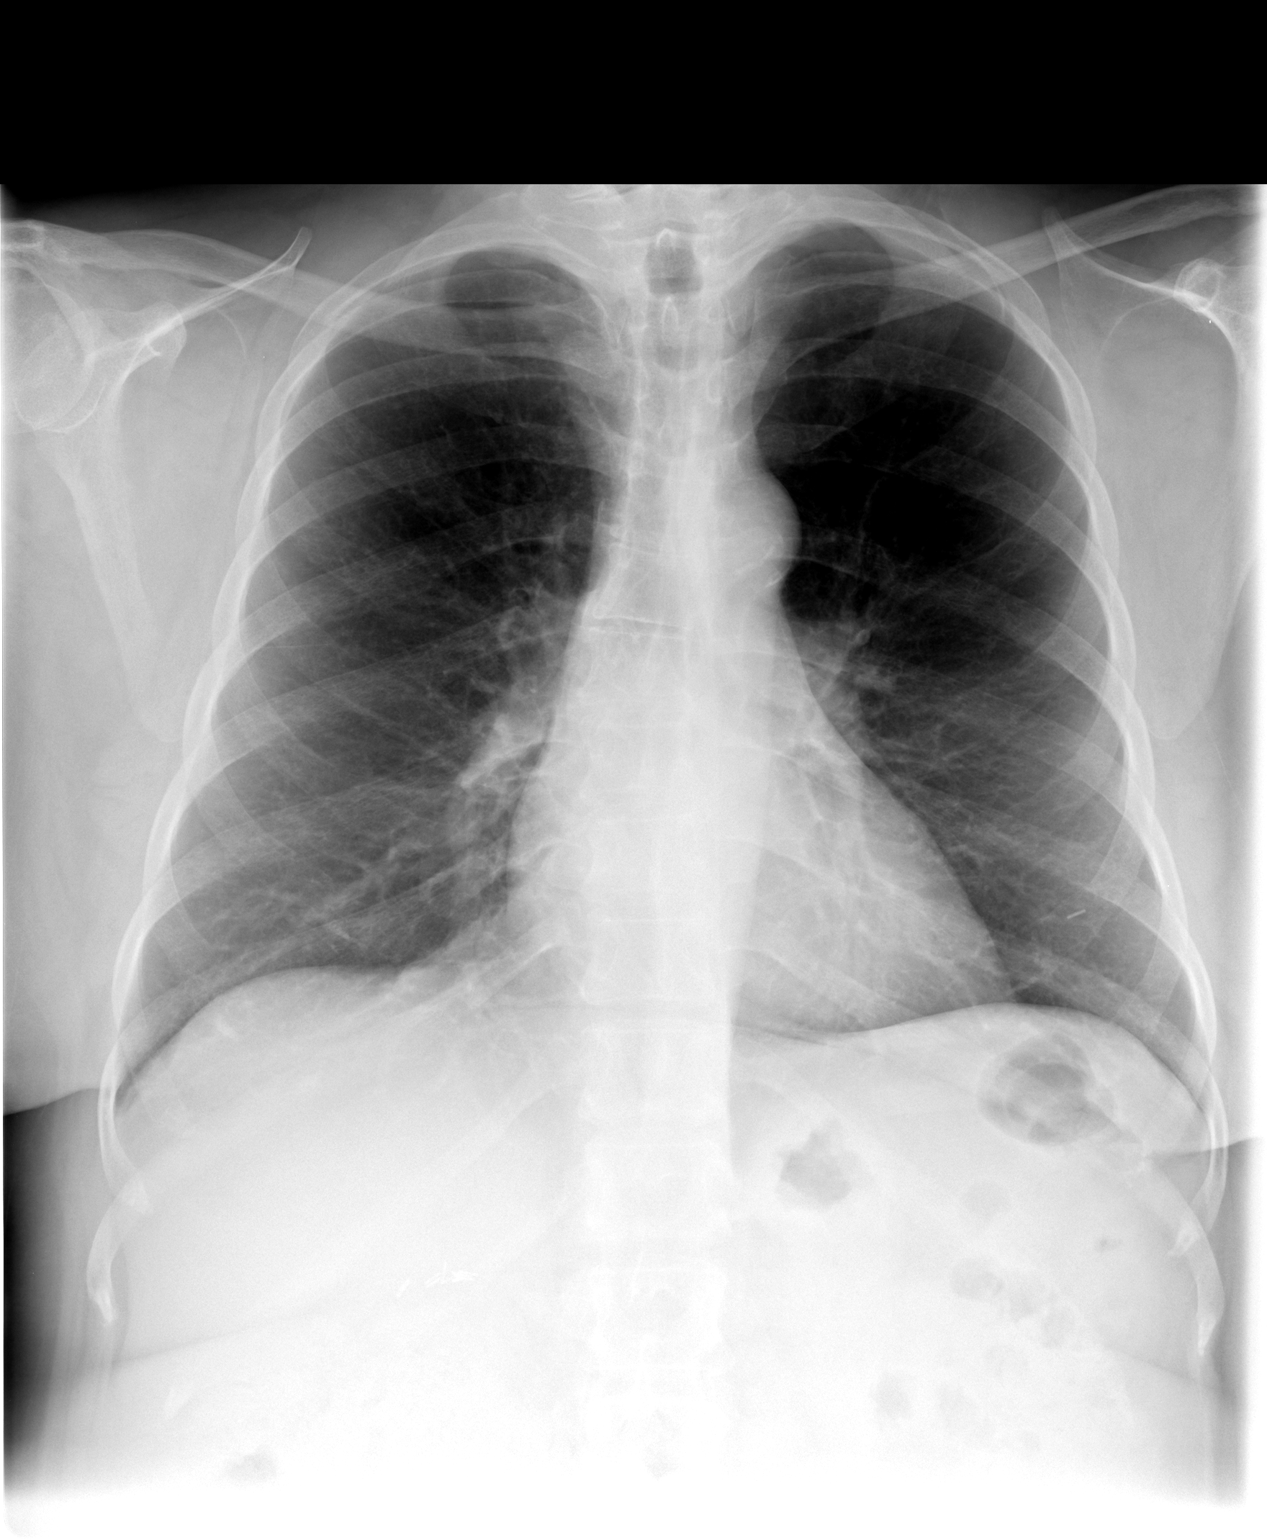

[view not recorded (2 of 2)]
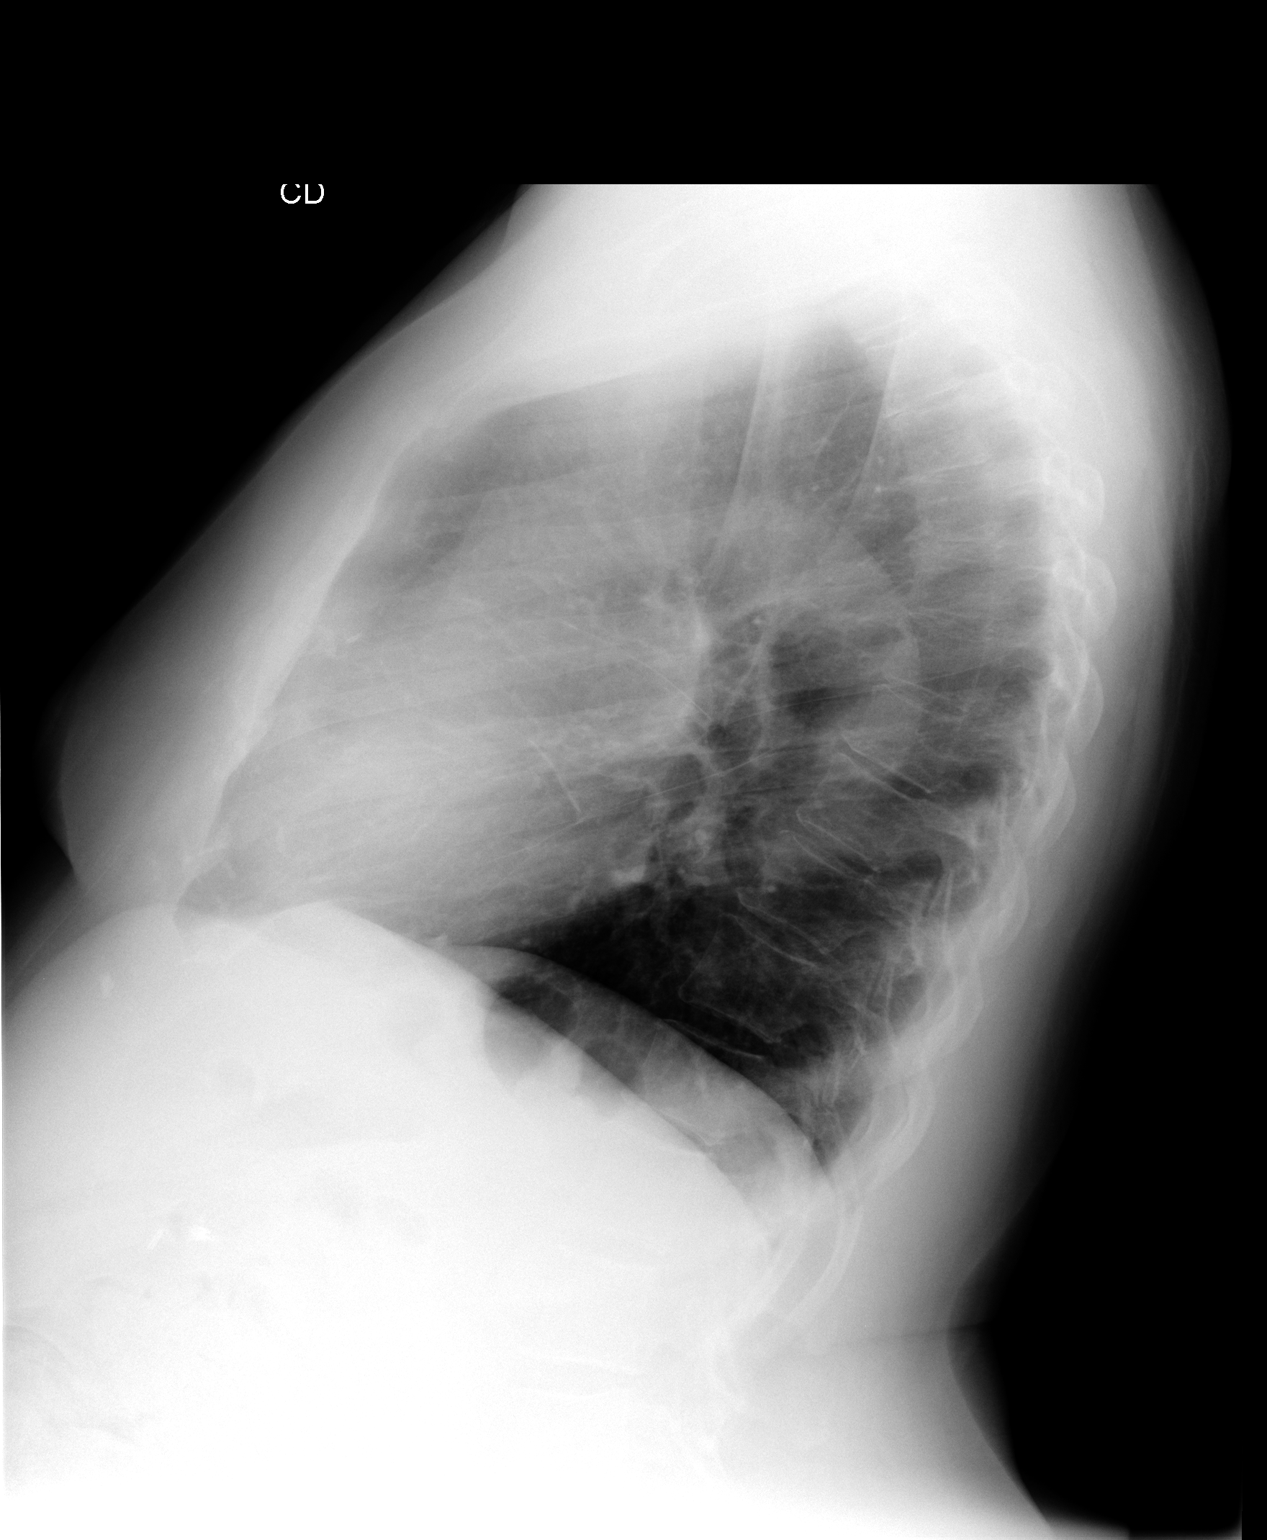

[2 of 2 positions shown; findings below may reference images not displayed]

FINDINGS: Lung volumes are within normal limits.  Cardiac size at
the upper limits of normal. Other mediastinal contours are within
normal limits.  Visualized tracheal air column is within normal
limits.  No pneumothorax, pulmonary edema, pleural effusion or
confluent pulmonary opacity.  Right upper quadrant surgical clips.
No acute osseous abnormality identified.  Mild scoliosis and
exaggerated thoracic kyphosis.  Small surgical clip at the left
lower chest wall.
IMPRESSION: No acute cardiopulmonary abnormality.

## 2013-01-03 ENCOUNTER — Ambulatory Visit (HOSPITAL_COMMUNITY): Payer: Self-pay | Admitting: Psychiatry

## 2013-01-10 ENCOUNTER — Telehealth: Payer: Self-pay | Admitting: Sports Medicine

## 2013-01-10 DIAGNOSIS — I10 Essential (primary) hypertension: Secondary | ICD-10-CM

## 2013-01-10 DIAGNOSIS — E782 Mixed hyperlipidemia: Secondary | ICD-10-CM

## 2013-01-10 DIAGNOSIS — R7301 Impaired fasting glucose: Secondary | ICD-10-CM

## 2013-01-10 NOTE — Telephone Encounter (Signed)
Patient wants to come in to have her cholesterol checked, but there isn't an order.  She called and spoke to someone previously who told her that they would have to speak to Dr. Berline Chough first.  There is no notation that I could find to that affect.  She would like Dr. Berline Chough to write the order so that we can schedule that appt.

## 2013-01-10 NOTE — Telephone Encounter (Signed)
Pt no showed to her last appointment. Please schedule her for a lab appointment at some point in next 1 month & a clinic visit with me ~1 week later. Orders paced

## 2013-01-11 NOTE — Telephone Encounter (Signed)
LVM on patient's VM informing of below.

## 2013-01-21 ENCOUNTER — Other Ambulatory Visit: Payer: Medicare Other

## 2013-01-21 ENCOUNTER — Ambulatory Visit (INDEPENDENT_AMBULATORY_CARE_PROVIDER_SITE_OTHER): Payer: Medicare Other | Admitting: Psychiatry

## 2013-01-21 ENCOUNTER — Encounter (HOSPITAL_COMMUNITY): Payer: Self-pay | Admitting: Psychiatry

## 2013-01-21 VITALS — Wt 170.0 lb

## 2013-01-21 DIAGNOSIS — E782 Mixed hyperlipidemia: Secondary | ICD-10-CM

## 2013-01-21 DIAGNOSIS — I10 Essential (primary) hypertension: Secondary | ICD-10-CM

## 2013-01-21 DIAGNOSIS — F329 Major depressive disorder, single episode, unspecified: Secondary | ICD-10-CM | POA: Diagnosis not present

## 2013-01-21 DIAGNOSIS — F3289 Other specified depressive episodes: Secondary | ICD-10-CM

## 2013-01-21 DIAGNOSIS — R7301 Impaired fasting glucose: Secondary | ICD-10-CM

## 2013-01-21 DIAGNOSIS — R700 Elevated erythrocyte sedimentation rate: Secondary | ICD-10-CM | POA: Diagnosis not present

## 2013-01-21 LAB — LIPID PANEL
Cholesterol: 192 mg/dL (ref 0–200)
HDL: 37 mg/dL — ABNORMAL LOW (ref >39)
Total CHOL/HDL Ratio: 5.2 Ratio
Triglycerides: 100 mg/dL (ref <150)

## 2013-01-21 LAB — BASIC METABOLIC PANEL
BUN: 9 mg/dL (ref 6–23)
CO2: 20 mEq/L (ref 19–32)
Calcium: 9.5 mg/dL (ref 8.4–10.5)
Glucose, Bld: 123 mg/dL — ABNORMAL HIGH (ref 70–99)

## 2013-01-21 MED ORDER — BUPROPION HCL ER (XL) 300 MG PO TB24: 300.0000 mg | ORAL_TABLET | Freq: Every day | ORAL | Status: DC

## 2013-01-21 MED ORDER — MIRTAZAPINE 30 MG PO TABS: 30.0000 mg | ORAL_TABLET | Freq: Every day | ORAL | Status: DC
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
## 2013-01-21 NOTE — Progress Notes (Signed)
BMP AND FLP DONE TODAY Lakeysha Slutsky 

## 2013-01-28 ENCOUNTER — Telehealth: Payer: Self-pay | Admitting: *Deleted

## 2013-01-28 NOTE — Telephone Encounter (Signed)
Message copied by Tanna Savoy on Fri Jan 28, 2013  5:23 PM ------      Message from: Gaspar Bidding D      Created: Fri Jan 28, 2013  5:19 PM       Pt needs to be scheduled for follow up appointment.      Would like to check A1c at next office visit as well ------

## 2013-01-28 NOTE — Telephone Encounter (Signed)
Left message on patient'[name] voicemail.[name]  

## 2013-03-15 ENCOUNTER — Ambulatory Visit: Payer: Self-pay | Admitting: Sports Medicine

## 2013-03-23 ENCOUNTER — Other Ambulatory Visit (HOSPITAL_COMMUNITY): Payer: Self-pay | Admitting: Psychiatry

## 2013-03-24 ENCOUNTER — Other Ambulatory Visit (HOSPITAL_COMMUNITY): Payer: Self-pay | Admitting: Psychiatry

## 2013-04-12 ENCOUNTER — Other Ambulatory Visit: Payer: Self-pay | Admitting: Sports Medicine

## 2013-04-24 ENCOUNTER — Other Ambulatory Visit (HOSPITAL_COMMUNITY): Payer: Self-pay | Admitting: Psychiatry

## 2013-04-24 DIAGNOSIS — F329 Major depressive disorder, single episode, unspecified: Secondary | ICD-10-CM

## 2013-04-26 ENCOUNTER — Ambulatory Visit (INDEPENDENT_AMBULATORY_CARE_PROVIDER_SITE_OTHER): Payer: Medicare Other | Admitting: Psychiatry

## 2013-04-26 ENCOUNTER — Encounter (HOSPITAL_COMMUNITY): Payer: Self-pay | Admitting: Psychiatry

## 2013-04-26 VITALS — BP 148/86 | HR 57 | Ht 63.5 in | Wt 161.4 lb

## 2013-04-26 DIAGNOSIS — F3289 Other specified depressive episodes: Secondary | ICD-10-CM

## 2013-04-26 DIAGNOSIS — F329 Major depressive disorder, single episode, unspecified: Secondary | ICD-10-CM | POA: Diagnosis not present

## 2013-04-26 MED ORDER — MIRTAZAPINE 30 MG PO TABS: 30.0000 mg | ORAL_TABLET | Freq: Every day | ORAL | Status: DC

## 2013-04-26 MED ORDER — BUPROPION HCL ER (XL) 300 MG PO TB24: ORAL_TABLET | ORAL | Status: DC
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
## 2013-04-28 ENCOUNTER — Ambulatory Visit (INDEPENDENT_AMBULATORY_CARE_PROVIDER_SITE_OTHER): Payer: Medicare Other | Admitting: Sports Medicine

## 2013-04-28 VITALS — BP 120/64 | HR 68 | Temp 98.8°F | Ht 63.5 in | Wt 160.0 lb

## 2013-04-28 DIAGNOSIS — S0502XA Injury of conjunctiva and corneal abrasion without foreign body, left eye, initial encounter: Secondary | ICD-10-CM | POA: Insufficient documentation

## 2013-04-28 DIAGNOSIS — E782 Mixed hyperlipidemia: Secondary | ICD-10-CM

## 2013-04-28 DIAGNOSIS — R7301 Impaired fasting glucose: Secondary | ICD-10-CM | POA: Diagnosis not present

## 2013-04-28 DIAGNOSIS — I10 Essential (primary) hypertension: Secondary | ICD-10-CM | POA: Diagnosis not present

## 2013-04-28 DIAGNOSIS — B356 Tinea cruris: Secondary | ICD-10-CM

## 2013-04-28 DIAGNOSIS — F17200 Nicotine dependence, unspecified, uncomplicated: Secondary | ICD-10-CM

## 2013-04-28 DIAGNOSIS — S058X9A Other injuries of unspecified eye and orbit, initial encounter: Secondary | ICD-10-CM

## 2013-04-28 HISTORY — DX: Injury of conjunctiva and corneal abrasion without foreign body, left eye, initial encounter: S05.02XA

## 2013-04-28 MED ORDER — CLOTRIMAZOLE 1 % EX CREA: TOPICAL_CREAM | Freq: Two times a day (BID) | CUTANEOUS | Status: DC

## 2013-04-28 MED ORDER — POLYMYXIN B-TRIMETHOPRIM 10000-0.1 UNIT/ML-% OP SOLN: 1.0000 [drp] | OPHTHALMIC | Status: DC

## 2013-04-28 NOTE — Assessment & Plan Note (Signed)
Has been Prava and Lipitor, stop Prava Recheck LDL today

## 2013-04-28 NOTE — Assessment & Plan Note (Signed)
A1c today.  

## 2013-04-28 NOTE — Assessment & Plan Note (Addendum)
Exam deferred today Try clotrimazole cream as some chaffing aspect > consider biopsy of sites if continues to not have improvement with treatment

## 2013-04-28 NOTE — Progress Notes (Signed)
  [facility]  Patient name: [name] MRN [id number]  Date of birth: [birth year]  CC & HPI:  [name] is a 62 y.o. female presenting today for follow up of:  #  Hypertension - chronic problem, well controlled.     no orthostasis,   no peripheral edema  no chest pain, no dyspnea on exertion, no orthopnea/PND  no episodes of unilateral weakness, dysarthria or acute visual changes  #  Impaired fasting glucose- chronic problem, well controlled.   no poluria, no polydipsia,  no new visual problems,   LE dysesthesias: None  #  Hyperlipidemia - chronic problem, well controlled.    # Tobacco Use: quit in January - no recurrence - quit cold [place]  # Tinea Cruris - has been using Nystatin Powder but continues to have issues with intertrigo.  Itching but no open areas ------------------------------------------------------------------------------------------------------------------ Medication Compliance: compliant all of the time  Diet Compliance: compliant most of the time ------------------------------------------------------------------------------------------------------------------ New Concerns:  # Corneal Abrasion: Reports Pain on lateral aspect of Left Eye.  Thinks she scratched it with her fingernail last week. No relief, nothing tried   ROS:  PER HPI  Pertinent History Reviewed:  Medical & Surgical Hx:  Reviewed: Significant for MDD,  Medications: Reviewed & Updated - See associated section in EMR Social History: Reviewed -  reports that she has quit smoking. Her smoking use included Cigarettes. She has a 10.5 pack-year smoking history. She does not have any smokeless tobacco history on file.   Objective Findings:  Vitals: BP 120/64  Pulse 68  Temp(Src) 98.8 F (37.1 C) (Oral)  Ht 5' 3.5" (1.613 m)  Wt 160 lb (72.576 kg)  BMI 27.89 kg/m2  PE: GENERAL:  Adult AA  female. In no discomfort; no respiratory distress. PSYCH: Alert and  appropriately interactive; Insight:Good   H&N: AT/[place], trachea midline EENT:  MMM, no scleral icterus, EOMi, slight visible abrasion on lateral sclera of left eye, no erythema, no foreign object HEART: RRR, S1/S2 heard, no murmur LUNGS: CTA B, no wheezes, no crackles EXTREMITIES: Moves all 4 extremities spontaneously, warm well perfused, no edema, bilateral DP and PT pulses 2/4.      Assessment & Plan:

## 2013-04-28 NOTE — Assessment & Plan Note (Addendum)
Polytrim  F/u with opthal Stop Fluticasone for consideration of glaucoma as contributing factor to pain but less likely given limited use

## 2013-04-28 NOTE — Assessment & Plan Note (Signed)
QUIT Jan 2014

## 2013-04-28 NOTE — Patient Instructions (Addendum)
It was nice to see you today.   Today we discussed: 1. NICOTINE ADDICTION GREAT JOB ON STOPPING  We are checking labs today - LDL Cholesterol, Direct - POCT glycosylated hemoglobin (Hb A1C)  For the itch and raw skin try - clotrimazole (LOTRIMIN) 1 % cream; Apply topically 2 (two) times daily.  Dispense: 60 g; Refill: 1  5. Corneal abrasion, left, initial encounter Please use until your finished.  Followup with your eye doctor - trimethoprim-polymyxin b (POLYTRIM) ophthalmic solution; Place 1 drop into the left eye every 4 (four) hours.  Dispense: 10 mL; Refill: 0   Please plan to return to see me in 3 months.  If you need anything prior to seeing me please call the clinic.  Call to schedule a procedure visit if you would like the cyst on your arm removed  Please Bring all medications with you to each appointment.

## 2013-05-02 ENCOUNTER — Encounter: Payer: Self-pay | Admitting: Sports Medicine

## 2013-05-02 NOTE — Assessment & Plan Note (Signed)
Stable/Well Controlled - no changes at this time. 

## 2013-05-04 ENCOUNTER — Telehealth: Payer: Self-pay | Admitting: Sports Medicine

## 2013-05-04 DIAGNOSIS — S0502XA Injury of conjunctiva and corneal abrasion without foreign body, left eye, initial encounter: Secondary | ICD-10-CM

## 2013-05-04 MED ORDER — POLYMYXIN B-TRIMETHOPRIM 10000-0.1 UNIT/ML-% OP SOLN: 1.0000 [drp] | OPHTHALMIC | Status: DC

## 2013-05-04 NOTE — Telephone Encounter (Signed)
Pt is requesting another refill on Polytrim. She didn't get the medication until yesterday 7/8 because the pharmacy didn't have the medication. She dropped the Polytrim in the garbage can. So now she needs another refill. JW

## 2013-05-11 DIAGNOSIS — H35369 Drusen (degenerative) of macula, unspecified eye: Secondary | ICD-10-CM | POA: Diagnosis not present

## 2013-05-11 DIAGNOSIS — H18839 Recurrent erosion of cornea, unspecified eye: Secondary | ICD-10-CM | POA: Diagnosis not present

## 2013-05-11 DIAGNOSIS — H2510 Age-related nuclear cataract, unspecified eye: Secondary | ICD-10-CM | POA: Diagnosis not present

## 2013-05-14 ENCOUNTER — Other Ambulatory Visit: Payer: Self-pay | Admitting: Sports Medicine

## 2013-05-17 ENCOUNTER — Other Ambulatory Visit: Payer: Self-pay | Admitting: Sports Medicine

## 2013-05-20 ENCOUNTER — Ambulatory Visit: Payer: Self-pay | Admitting: Sports Medicine

## 2013-05-26 ENCOUNTER — Ambulatory Visit (INDEPENDENT_AMBULATORY_CARE_PROVIDER_SITE_OTHER): Payer: Medicare Other | Admitting: Licensed Clinical Social Worker

## 2013-05-26 DIAGNOSIS — F332 Major depressive disorder, recurrent severe without psychotic features: Secondary | ICD-10-CM

## 2013-05-26 NOTE — Progress Notes (Signed)
   THERAPIST PROGRESS NOTE  Session Time: 2:00pm-2:50pm  Participation Level: Active  Behavioral Response: Well GroomedAlertIrritable  Type of Therapy: Individual Therapy  Treatment Goals addressed: Coping  Interventions: CBT, Solution Focused, Supportive and Reframing  Summary: [name] is a 62 y.o. female who presents with euthymic mood and irritable affect. She reports that she has been doing well since she has not been to therapy since December 2013. She reports ongoing frustration with her children and their lack of support. She has months where she is unable to pay all her bills and does not have enough food. She will ask her children once for help, but after that refuses to ask further. She is unhappy where she lives and the problems are not being fixed. She wants to move and is looking into this. She processes her regret that she moved away from [place]. She feels this was a big mistake. She remains isolated most days and wants to socialize more, but has difficulty walking. Her sleep and appetite are wnl.    Suicidal/Homicidal: Nowithout intent/plan  Therapist Response: Assessed patients current functioning and reviewed progress. Reviewed coping strategies. Assessed patients safety and assisted in identifying protective factors.  Reviewed crisis plan with patient. Assisted patient with the expression of frustration. Reviewed patients self care plan. Assessed progress related to self care. Patients self care is fair. Recommend daily exercise, increased socialization and recreation. Used CBT to assist patient with the identification of negative distortions and irrational thoughts. Encouraged patient to verbalize alternative and factual responses which challenge thought distortions. Used motivational interviewing to assist and encourage patient through the change process. Explored patients barriers to change.   Plan: Return again in four weeks.  Diagnosis: Axis I: Major  Depression, Recurrent severe    Axis II: No diagnosis    Ilah Boule, LCSW 05/26/2013

## 2013-06-14 ENCOUNTER — Other Ambulatory Visit: Payer: Self-pay | Admitting: Family Medicine

## 2013-07-06 ENCOUNTER — Ambulatory Visit (INDEPENDENT_AMBULATORY_CARE_PROVIDER_SITE_OTHER): Payer: Medicare Other | Admitting: Licensed Clinical Social Worker

## 2013-07-06 DIAGNOSIS — F332 Major depressive disorder, recurrent severe without psychotic features: Secondary | ICD-10-CM | POA: Diagnosis not present

## 2013-07-06 NOTE — Progress Notes (Signed)
   THERAPIST PROGRESS NOTE  Session Time: 2:00pm-2:50pm  Participation Level: Active  Behavioral Response: Well GroomedAlertDepressed and Irritable  Type of Therapy: Individual Therapy  Treatment Goals addressed: Coping  Interventions: CBT, Strength-based, Assertiveness Training, Supportive and Reframing  Summary: [name] is a 62 y.o. female who presents with depressed mood and flat affect. She reports that her depression is getting worse because the holidays will be coming soon and that she has great difficulty each year during the holidays. She processes how the holidays were when her mother was living and since her death how she has become isolated and feels that her children do not want her around. She is angry and processes her grief over being treated poorly by her daughter. She does not also set clear boundaries and assert herself. She is fearful of her own anger, so she does not express her feelings often. She endorses feelings of rejection and disappointment. Her sleep is wnl. She is only able to eat one meal a day due to finances. Provided her with food bank information.    Suicidal/Homicidal: Nowithout intent/plan  Therapist Response: Assessed patients current functioning and reviewed progress. Reviewed coping strategies. Assessed patients safety and assisted in identifying protective factors.  Reviewed crisis plan with patient. Assisted patient with the expression of sadness. Reviewed patients self care plan. Assessed progress related to self care. Patients self care is fair. Recommend daily exercise, increased socialization and recreation. Used CBT to assist patient with the identification of negative distortions and irrational thoughts. Encouraged patient to verbalize alternative and factual responses which challenge thought distortions. Reviewed healthy boundaries and assertive communication. Used motivational interviewing to assist and encourage patient through the  change process. Explored patients barriers to change.   Plan: Return again in two weeks.  Diagnosis: Axis I: Major Depression, Recurrent severe    Axis II: No diagnosis    Jamica Woodyard, LCSW 07/06/2013

## 2013-07-07 ENCOUNTER — Ambulatory Visit (INDEPENDENT_AMBULATORY_CARE_PROVIDER_SITE_OTHER): Payer: Medicare Other | Admitting: Sports Medicine

## 2013-07-07 ENCOUNTER — Encounter: Payer: Self-pay | Admitting: Sports Medicine

## 2013-07-07 VITALS — BP 103/71 | HR 61 | Temp 98.1°F | Ht 63.5 in | Wt 160.7 lb

## 2013-07-07 DIAGNOSIS — Z113 Encounter for screening for infections with a predominantly sexual mode of transmission: Secondary | ICD-10-CM

## 2013-07-07 DIAGNOSIS — A638 Other specified predominantly sexually transmitted diseases: Secondary | ICD-10-CM

## 2013-07-07 DIAGNOSIS — N9489 Other specified conditions associated with female genital organs and menstrual cycle: Secondary | ICD-10-CM

## 2013-07-07 DIAGNOSIS — Z23 Encounter for immunization: Secondary | ICD-10-CM | POA: Diagnosis not present

## 2013-07-07 DIAGNOSIS — E876 Hypokalemia: Secondary | ICD-10-CM | POA: Diagnosis not present

## 2013-07-07 DIAGNOSIS — E782 Mixed hyperlipidemia: Secondary | ICD-10-CM | POA: Diagnosis not present

## 2013-07-07 DIAGNOSIS — N949 Unspecified condition associated with female genital organs and menstrual cycle: Secondary | ICD-10-CM

## 2013-07-07 LAB — CBC WITH DIFFERENTIAL/PLATELET
Eosinophils Relative: 4 % (ref 0–5)
HCT: 39.3 % (ref 36.0–46.0)
Hemoglobin: 13.2 g/dL (ref 12.0–15.0)
Lymphocytes Relative: 56 % — ABNORMAL HIGH (ref 12–46)
Lymphs Abs: 4.3 10*3/uL — ABNORMAL HIGH (ref 0.7–4.0)
MCV: 84.3 fL (ref 78.0–100.0)
Monocytes Absolute: 0.5 10*3/uL (ref 0.1–1.0)
Neutro Abs: 2.6 10*3/uL (ref 1.7–7.7)
RBC: 4.66 MIL/uL (ref 3.87–5.11)
WBC: 7.6 10*3/uL (ref 4.0–10.5)

## 2013-07-07 MED ORDER — ZOSTER VACCINE LIVE 19400 UNT/0.65ML ~~LOC~~ SOLR: 0.6500 mL | Freq: Once | SUBCUTANEOUS | Status: DC

## 2013-07-07 MED ORDER — ACYCLOVIR 200 MG PO CAPS: 200.0000 mg | ORAL_CAPSULE | Freq: Every day | ORAL | Status: DC

## 2013-07-07 NOTE — Patient Instructions (Addendum)
Follow up after the new years unless needed sooner. Start taking the medicine I have presecribed for the next 10 days 'Get your shingles vaccine Flu shot today

## 2013-07-08 LAB — COMPREHENSIVE METABOLIC PANEL
ALT: 23 U/L (ref 0–35)
AST: 26 U/L (ref 0–37)
Alkaline Phosphatase: 122 U/L — ABNORMAL HIGH (ref 39–117)
Chloride: 103 mEq/L (ref 96–112)
Creat: 1.45 mg/dL — ABNORMAL HIGH (ref 0.50–1.10)
Total Bilirubin: 0.6 mg/dL (ref 0.3–1.2)

## 2013-07-08 LAB — RPR

## 2013-07-14 ENCOUNTER — Encounter: Payer: Self-pay | Admitting: Sports Medicine

## 2013-07-14 NOTE — Progress Notes (Signed)
[name]  [name] - 62 y.o. female MRN [id number]  Date of birth: August 14, 1951  HPI & ROS  [name] presents today for Follow up of chronic medical conditions and for evaluation of a genital lesion.  She has remained quit smoking since January.  Genital lesion has been present for ~1 week.  Has issues on and off with regular itching and "jock itch" but will occasionally have lesions similar to this.  Described as nodular, painful and itching.  No vaginal discharge, no dysuria.  No fevers, no chills, no rigors, no n/v, no diarrhea.  Denies chest pain, dyspnea/DOE, orthopnea, or LE swelling.  Reports good compliance with medications and no side effects.  Trying to stay active but reports the heat has been preventing her from increasing her activity  Care Coordination & Pertinent History  Hx is remarkable for HTN, HLD - without prior CV event - and well controlled depression followed by Dr. Berton Mount.  Previously had impared fasting glucose but last A1c was 6.0 and diet controlled.  COPD but no significant debility.    Recent Labs  01/21/13 0947 04/28/13 1523  HGBA1C  --  6.0  TRIG 100  --   CHOL 192  --   HDL 37*  --   LDLCALC 135*  --      Otherwise please see associated EMR sections for complete problem List, past medical history, past surgical history, family history and social history. Medications   Current Outpatient Prescriptions on File Prior to Visit  Medication Sig Dispense Refill  . albuterol (PROAIR HFA) 108 (90 BASE) MCG/ACT inhaler Inhale 2 puffs into the lungs as directed.  1 Inhaler  0  . atorvastatin (LIPITOR) 40 MG tablet TAKE 1 TABLET BY MOUTH ONCE DAILY  30 tablet  11  . buPROPion (WELLBUTRIN XL) 300 MG 24 hr tablet TAKE 1 TABLET BY MOUTH EVERY DAY  30 tablet  1  . cloNIDine (CATAPRES) 0.3 MG tablet TAKE 1&1/2 TABLETS EVERY BEDTIME.  45 tablet  5  . clotrimazole (LOTRIMIN) 1 % cream Apply topically 2 (two) times daily.  60 g  1  .  fluticasone (FLONASE) 50 MCG/ACT nasal spray Place 2 sprays into the nose daily.  16 g  6  . mirtazapine (REMERON) 30 MG tablet Take 1 tablet (30 mg total) by mouth at bedtime.  30 tablet  2  . pantoprazole (PROTONIX) 20 MG tablet Take 1 tablet (20 mg total) by mouth daily.  30 tablet  3  . topiramate (TOPAMAX) 25 MG tablet Take 1 tablet (25 mg total) by mouth 2 (two) times daily.  60 tablet  2  . triamterene-hydrochlorothiazide (MAXZIDE) 75-50 MG per tablet TAKE ONE TABLET BY MOUTH EVERY DAY  30 tablet  11  . trimethoprim-polymyxin b (POLYTRIM) ophthalmic solution Place 1 drop into the left eye every 4 (four) hours.  10 mL  0   No current facility-administered medications on file prior to visit.    Objective Findings:  Vitals: BP 103/71  Pulse 61  Temp(Src) 98.1 F (36.7 C) (Oral)  Ht 5' 3.5" (1.613 m)  Wt 160 lb 11.2 oz (72.893 kg)  BMI 28.02 kg/m2  PE: GENERAL:    Adult  female.  In no discomfort; no respiratory distress.   PSYCH:    Alert and appropriate.  Good insight HNEENT:    CARDIO: RRR, S1/S2 heard, no murmur LUNGS:    CTA B, no wheezes, no crackles ABDOMEN:    EXTREMITIES:  Warm well perfused.  Moves all 4 spontaneously GU:      Chaperone: CMA  External: Noted mild intrigo without erythema; 1 dime sized circular shallow ulceration on R labia majora;  normal appearing genitalia, no discharge   Remained exam deferred  SKIN:    NEUROMSK:    Assessment   1. Genital lesion, female   2. Hyperlipidemia - Mixed   3. Other specified venereal diseases   4. Screening for venereal disease   5. Need for prophylactic vaccination and inoculation against influenza    Plan   Treat empirically for presumed HSV and collected associated STI labs.   Check Lipid profile for risk stratification > consider statin after ASCVD risk calculation Influenza today; Shingles rx given encouraged Otherwise per AVS

## 2013-07-15 DIAGNOSIS — N949 Unspecified condition associated with female genital organs and menstrual cycle: Secondary | ICD-10-CM | POA: Insufficient documentation

## 2013-07-22 ENCOUNTER — Telehealth: Payer: Self-pay | Admitting: *Deleted

## 2013-07-22 MED ORDER — POTASSIUM CHLORIDE CRYS ER 20 MEQ PO TBCR: 20.0000 meq | EXTENDED_RELEASE_TABLET | Freq: Every day | ORAL | Status: DC

## 2013-07-22 NOTE — Addendum Note (Signed)
Addended by: Gaspar Bidding D on: 07/22/2013 01:18 PM   Modules accepted: Orders

## 2013-07-22 NOTE — Progress Notes (Addendum)
Labs overall looked good. Start low-dose potassium daily for hypokalemia - 1 followup basic metabolic panel next week (order placed) Continue Lipitor 40.  Please call to inform patient of the above and to schedule a followup lab visit next week.

## 2013-07-25 NOTE — Telephone Encounter (Signed)
completed

## 2013-07-27 ENCOUNTER — Encounter (HOSPITAL_COMMUNITY): Payer: Self-pay | Admitting: Psychiatry

## 2013-07-27 ENCOUNTER — Ambulatory Visit (INDEPENDENT_AMBULATORY_CARE_PROVIDER_SITE_OTHER): Payer: Medicare Other | Admitting: Psychiatry

## 2013-07-27 VITALS — BP 127/98 | HR 65 | Ht 63.5 in | Wt 161.6 lb

## 2013-07-27 DIAGNOSIS — F329 Major depressive disorder, single episode, unspecified: Secondary | ICD-10-CM | POA: Diagnosis not present

## 2013-07-27 MED ORDER — BUPROPION HCL ER (XL) 300 MG PO TB24: ORAL_TABLET | ORAL | Status: DC

## 2013-07-27 MED ORDER — MIRTAZAPINE 30 MG PO TABS: 30.0000 mg | ORAL_TABLET | Freq: Every day | ORAL | Status: DC
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
## 2013-07-28 ENCOUNTER — Other Ambulatory Visit: Payer: Self-pay

## 2013-08-03 ENCOUNTER — Other Ambulatory Visit: Payer: Self-pay | Admitting: Sports Medicine

## 2013-08-29 ENCOUNTER — Encounter: Payer: Self-pay | Admitting: Sports Medicine

## 2013-08-29 ENCOUNTER — Ambulatory Visit (INDEPENDENT_AMBULATORY_CARE_PROVIDER_SITE_OTHER): Payer: Medicare Other | Admitting: Sports Medicine

## 2013-08-29 VITALS — BP 128/70 | HR 69 | Temp 97.8°F | Ht 63.5 in | Wt 160.0 lb

## 2013-08-29 DIAGNOSIS — E876 Hypokalemia: Secondary | ICD-10-CM

## 2013-08-29 DIAGNOSIS — K219 Gastro-esophageal reflux disease without esophagitis: Secondary | ICD-10-CM | POA: Diagnosis not present

## 2013-08-29 DIAGNOSIS — IMO0002 Reserved for concepts with insufficient information to code with codable children: Secondary | ICD-10-CM

## 2013-08-29 DIAGNOSIS — G479 Sleep disorder, unspecified: Secondary | ICD-10-CM

## 2013-08-29 DIAGNOSIS — F332 Major depressive disorder, recurrent severe without psychotic features: Secondary | ICD-10-CM

## 2013-08-29 LAB — BASIC METABOLIC PANEL
BUN: 10 mg/dL (ref 6–23)
CO2: 22 mEq/L (ref 19–32)
Chloride: 107 mEq/L (ref 96–112)
Creat: 1.54 mg/dL — ABNORMAL HIGH (ref 0.50–1.10)
Potassium: 3.7 mEq/L (ref 3.5–5.3)

## 2013-08-29 MED ORDER — DEXLANSOPRAZOLE 60 MG PO CPDR: 60.0000 mg | DELAYED_RELEASE_CAPSULE | Freq: Every day | ORAL | Status: DC

## 2013-08-29 NOTE — Assessment & Plan Note (Signed)
Patient is having difficulty with sleep maintenance.

## 2013-08-29 NOTE — Progress Notes (Signed)
[facility] [name] - 62 y.o. female MRN [id number]  Date of birth: [birth year]  CC, HPI, INTERVAL HISTORY & ROS  [name] is here today for acute visit for abdominal apin    She reports that she has had significant worsening in her overall health.  She has become more stressed and has had multiple family issues that have contributed to worsening of her anxiety attacks, disordered sleep, worsening mood.  She reports her abdominal pain began approximately 2-3 weeks ago following her maternal aunt being [place]  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  She reports occasional anxiety attacks in the middle of the night that will sometimes wake her from sleep causing her to go outside to "catch her breath."  She denies any significant orthopnea or PND and feels like these are associated only with her anxiety.  Pt denies chest pain, dyspnea at rest or exertion, PND, lower extremity edema.  She denies any vomiting, hematochezia, melena.  Her abdominal pain is described as a reflux-like sensation that is epigastric in nature.  Does not radiate to her back.  She is concerned because she has had a perforated viscus do to a duodenal ulcer previously.  She has been taking her protonix but this does not seem to be helping as much as previously.  She also reports that her living situation seems to worsen.  She wants to move but does not think she can afford to do so.  History  Past Medical, Surgical, Social, and Family History Reviewed per EMR Medications and Allergies reviewed and all updated if necessary. Objective Findings  VITALS: HR: 69 bpm  BP: 128/70 mmHg  TEMP: 97.8 F (36.6 C) (Oral)  RESP:    HT: 5' 3.5" (161.3 cm)  WT: 160 lb (72.576 kg)  BMI: 28   BP Readings from Last 3 Encounters:  08/29/13  128/70  07/27/13 127/98  07/07/13 103/71   Wt Readings from Last 3 Encounters:  08/29/13 160 lb (72.576 kg)  07/27/13 161 lb 9.6 oz (73.301 kg)  07/07/13 160 lb 11.2 oz (72.893 kg)     PHYSICAL EXAM: GENERAL:  adult [place] female.  Emotionally distraught; no respiratory distress  PSYCH: alert and appropriately interactive.  good insight but tearful. no SI or HI.  Reports feelings of being overwhelmed.    HNEENT: No JVD   CARDIO: RRR, S1/S2 heard, no murmur  LUNGS: CTA B, no wheezes, no crackles  ABDOMEN: Positive bowel sounds,  epigastric tenderness to mild palpation.  Well-healed post surgical scarring.  There is no rebound, no guarding, negative Murphy's, negative McBurney's,   EXTREM:  Warm, well perfused.  Moves all 4 extremities spontaneously; no lateralization.  No noted foot lesions.  Distal pulses 2+/4.  No pretibial edema.  GU:   SKIN:     Assessment & Plan   Problems addressed today: General Plan & Pt Instructions:  1. Hypokalemia   2. GERD   3. Sleep difficulties   4. Major depressive disorder, recurrent episode, severe, without mention of psychotic behavior       BMET collected today  Change from Protonix to Dexilant  (prior athoriziation may be required)  Follow with Dr. Lolly Mustache and your Counselor      For further discussion of A/P and for follow up issues see problem based  charting if applicable.

## 2013-08-29 NOTE — Assessment & Plan Note (Addendum)
Encouraged to followup with counselor, church resources.  Discussed following up with Dr. Lolly Mustache if counselor agrees.  Worsening social situation given increasing stress and anxiety do to being the [place] of the family.  Also unhappy with her living arrangement; lives in [place]. > Will have [name] our social worker called to discuss potential housing assistance and to provide emotional support.

## 2013-08-29 NOTE — Assessment & Plan Note (Addendum)
Given significant history of perforated duodenal ulcer.  Will change from protonic for does not seem to be helping at this point to Dexilant. Discussed that prior authorization may be required. - Red flags reviewed today for return and include s/sx of major GI bleed, perforations.  > Consider referral back to gastroenterology.

## 2013-08-29 NOTE — Patient Instructions (Signed)
   BMET collected today  Change from Protonix to Dexilant  (prior athoriziation may be required)  Follow with Dr. Lolly Mustache and your Counselor    If you need anything prior to your next visit please call the clinic. Please Bring all medications or accurate medication list with you to each appointment; an accurate medication list is essential in providing you the best care possible.

## 2013-08-30 MED ORDER — HYDROCHLOROTHIAZIDE 25 MG PO TABS: 25.0000 mg | ORAL_TABLET | Freq: Every day | ORAL | Status: DC

## 2013-08-30 NOTE — Progress Notes (Addendum)
Potassium has corrected on recheck  However kidney function continues to worsen. - I will change her from Maxzide to HCTZ alone > Follow up basic metabolic panel at next visit in 3 weeks > Consider referral to nephrology as has been in upward trend.

## 2013-08-30 NOTE — Addendum Note (Signed)
Addended by: Gaspar Bidding D on: 08/30/2013 06:15 PM   Modules accepted: Orders, Medications

## 2013-09-05 ENCOUNTER — Telehealth: Payer: Self-pay | Admitting: *Deleted

## 2013-09-05 NOTE — Telephone Encounter (Signed)
Message copied by Tanna Savoy on Mon Sep 05, 2013 11:36 AM ------      Message from: Gaspar Bidding D      Created: Tue Aug 30, 2013  6:15 PM       Please call and inform that we need to change one of her BP meds due to worsening kidney function. STOP MAXZIDE, start HCTZ alone.  Will recheck BP and kidney function at follow up appointment ------

## 2013-09-05 NOTE — Telephone Encounter (Signed)
Left detailed message on patients voicemail,. [employer], Virgel Bouquet

## 2013-09-07 ENCOUNTER — Other Ambulatory Visit: Payer: Self-pay | Admitting: Sports Medicine

## 2013-09-08 ENCOUNTER — Telehealth: Payer: Self-pay | Admitting: Clinical

## 2013-09-08 NOTE — Telephone Encounter (Signed)
Clinical Child psychotherapist (CSW) received a referral to provide pt with housing resources. CSW contacted pt and inquired about pt needs, around how much she would like to pay for housing and in what areas. Pt requesting information on a 1bdrm apartment in either [place] or [place] at $400 or below. CSW to mail housing resources to pt today. Pt appreciative and denied having any other needs.  Theresia Bough, MSW, LCSW [phone]

## 2013-10-05 ENCOUNTER — Ambulatory Visit (HOSPITAL_COMMUNITY): Payer: Self-pay | Admitting: Licensed Clinical Social Worker

## 2013-10-13 ENCOUNTER — Encounter: Payer: Self-pay | Admitting: Sports Medicine

## 2013-10-13 ENCOUNTER — Ambulatory Visit (INDEPENDENT_AMBULATORY_CARE_PROVIDER_SITE_OTHER): Payer: Medicare Other | Admitting: Sports Medicine

## 2013-10-13 VITALS — BP 141/87 | HR 68 | Temp 98.3°F | Ht 63.5 in | Wt 162.0 lb

## 2013-10-13 DIAGNOSIS — J209 Acute bronchitis, unspecified: Secondary | ICD-10-CM | POA: Diagnosis present

## 2013-10-13 MED ORDER — AZITHROMYCIN 250 MG PO TABS: ORAL_TABLET | ORAL | Status: DC

## 2013-10-13 MED ORDER — GABAPENTIN 100 MG PO CAPS: 100.0000 mg | ORAL_CAPSULE | Freq: Three times a day (TID) | ORAL | Status: DC | PRN

## 2013-10-13 MED ORDER — BENZONATATE 200 MG PO CAPS: 200.0000 mg | ORAL_CAPSULE | Freq: Two times a day (BID) | ORAL | Status: DC | PRN

## 2013-10-13 NOTE — Assessment & Plan Note (Addendum)
Given duration of symptoms will treat with azithromycin. Tussionex for cough suppressant, trial of Neurontin for cough suppression as well given duration and potential neuropathic component of cough

## 2013-10-13 NOTE — Progress Notes (Signed)
  ZAFIRAH VANZEE - 62 y.o. female MRN [id number]  Date of birth: May 17, 1951  CC, HPI, INTERVAL HISTORY & ROS  Collette is here today for cold like symptoms    She reports 2 weeks of cough, congestion, subjective fevers.  No dysuria, frequency, hesitancy.  No nausea no vomiting no diarrhea  Has not been smoking  As tried over-the-counter Tylenol, ibuprofen, cough suppressants.  History  Past Medical, Surgical, Social, and Family History Reviewed per EMR Medications and Allergies reviewed and all updated if necessary. Objective Findings  VITALS: HR: 68 bpm  BP: 141/87 mmHg  TEMP: 98.3 F (36.8 C) (Oral)  RESP: 100 %  HT: 5' 3.5" (161.3 cm)  WT: 162 lb (73.483 kg)  BMI: 28.3   BP Readings from Last 3 Encounters:  10/13/13 141/87  08/29/13 128/70  07/27/13 127/98   Wt Readings from Last 3 Encounters:  10/13/13 162 lb (73.483 kg)  08/29/13 160 lb (72.576 kg)  07/27/13 161 lb 9.6 oz (73.301 kg)     PHYSICAL EXAM: GENERAL: Adult African American  female. In no discomfort; no respiratory distress  PSYCH: alert and appropriate, good insight   HNEENT: H&N: AT/[place], trachea midline  Eyes: no scleral icterus, no conjunctival exudate  Ears: B TM clear, no erythema  Nose: Nasal pallor; small excoriation on left middle trubinate; not bleeding  Oropharynx: MMM  Dentention:     CARDIO: RRR, S1/S2 heard, no murmur  LUNGS: CTA B, no wheezes, no crackles  ABDOMEN:   EXTREM:    GU:   SKIN:     Assessment & Plan   Problems addressed today: General Plan & Pt Instructions:  1. Acute bronchitis       Symptomatic treatment as below  Azithromycin + Tessalon and neurontin for cough      For further discussion of A/P and for follow up issues see problem based charting if applicable.

## 2013-10-13 NOTE — Patient Instructions (Signed)
   Symptomatic treatment as below  Azithromycin + Tessalon and neurontin for cough    If you need anything prior to your next visit please call the clinic. Please Bring all medications or accurate medication list with you to each appointment; an accurate medication list is essential in providing you the best care possible.    Please continue to drink plenty of fluids Take acetaminophen (Tylenol) 1X500mg  or 2X325mg  tablets every 8 hours  Take Ibuprofen (Advil or Motrin) 2-3 200mg  tablets every 8 hours  Try to alternate the acetaminophen and ibuprofen so that you take one every 4 hours Honey has been shown to help with cough.  You can try mixing  a teaspoon of honey in warm water or caffeine free herbal tea before bedtime.

## 2013-11-02 ENCOUNTER — Ambulatory Visit (INDEPENDENT_AMBULATORY_CARE_PROVIDER_SITE_OTHER): Payer: Medicare Other | Admitting: Psychiatry

## 2013-11-02 ENCOUNTER — Encounter (HOSPITAL_COMMUNITY): Payer: Self-pay | Admitting: Psychiatry

## 2013-11-02 VITALS — BP 156/80 | HR 61 | Ht 63.5 in | Wt 163.8 lb

## 2013-11-02 DIAGNOSIS — F329 Major depressive disorder, single episode, unspecified: Secondary | ICD-10-CM

## 2013-11-02 DIAGNOSIS — F3289 Other specified depressive episodes: Secondary | ICD-10-CM

## 2013-11-02 MED ORDER — BUPROPION HCL ER (XL) 300 MG PO TB24: ORAL_TABLET | ORAL | Status: DC

## 2013-11-02 MED ORDER — MIRTAZAPINE 30 MG PO TABS: 30.0000 mg | ORAL_TABLET | Freq: Every day | ORAL | Status: DC

## 2013-11-02 NOTE — Progress Notes (Signed)
[place] Progress Note  KATHE WIRICK [id number] 63 y.o.  11/02/2013 1:50 PM  Chief Complaint:  Medication management and followup.      History of Present Illness:   Ayaat came for her followup appointment.  She is compliant with her Remeron and Wellbutrin.  She recently seen her primary care physician and her antihypertensive medications were changed because of increased cracking.  She likes Wellbutrin and Remeron.  She is sleeping better.  She denies any irritability or anger.  She had a good Christmas.  She was able to visit her daughter and grand kids.  She is less depressed less anxious.  She denies any recent crying spells or any feeling of hopelessness or worthlessness.  She is seeing Erasmo Downer for counseling.  She is not drinking or using any illegal substances.  Suicidal Ideation: No Plan Formed: No Patient has means to carry out plan: No  Homicidal Ideation: No Plan Formed: No Patient has means to carry out plan: No  Review of Systems: Psychiatric: Agitation: No Hallucination: No Depressed Mood: No Insomnia: No Hypersomnia: No Altered Concentration: No Feels Worthless: No Grandiose Ideas: No Belief In Special Powers: No New/Increased Substance Abuse: No Compulsions: No  Neurologic: Headache: Yes Seizure: No Paresthesias: No  Past Psychiatric History;  She has history of psychiatric inpatient treatment at [facility].  She was admitted to get detox from her Xanax.  Patient also seeing psychiatrist when she was in [employer] in [place] in [place] and than [facility].  In the past she had tried Paxil Prozac Zoloft.  Patient denies any history of suicidal attempt or any psychosis.  Medical History;  She has history of hypertension, migraine headache, asthma, GERD, chronic pain.  Recently her migraine headaches were worse and she start taking Topamax.  Her primary care physician is Dr. Paulla Fore.   Outpatient  Encounter Prescriptions as of 11/02/2013  Medication Sig  . buPROPion (WELLBUTRIN XL) 300 MG 24 hr tablet TAKE 1 TABLET BY MOUTH EVERY DAY  . mirtazapine (REMERON) 30 MG tablet Take 1 tablet (30 mg total) by mouth at bedtime.  . [DISCONTINUED] buPROPion (WELLBUTRIN XL) 300 MG 24 hr tablet TAKE 1 TABLET BY MOUTH EVERY DAY  . [DISCONTINUED] mirtazapine (REMERON) 30 MG tablet Take 1 tablet (30 mg total) by mouth at bedtime.  [name] acyclovir (ZOVIRAX) 200 MG capsule Take 1 capsule (200 mg total) by mouth 5 (five) times daily.  [name] albuterol (PROAIR HFA) 108 (90 BASE) MCG/ACT inhaler Inhale 2 puffs into the lungs as directed.  [name] atorvastatin (LIPITOR) 40 MG tablet TAKE 1 TABLET BY MOUTH ONCE DAILY  . azithromycin (ZITHROMAX) 250 MG tablet 2 tablets by mouth today then 1 tablet daily  . benzonatate (TESSALON) 200 MG capsule Take 1 capsule (200 mg total) by mouth 2 (two) times daily as needed for cough.  . cloNIDine (CATAPRES) 0.3 MG tablet TAKE 1&1/2 TABLETS EVERY BEDTIME.  . clotrimazole (LOTRIMIN) 1 % cream Apply topically 2 (two) times daily.  [name] dexlansoprazole (DEXILANT) 60 MG capsule Take 1 capsule (60 mg total) by mouth daily.  . fluticasone (FLONASE) 50 MCG/ACT nasal spray Place 2 sprays into the nose daily.  [name] gabapentin (NEURONTIN) 100 MG capsule Take 1 capsule (100 mg total) by mouth 3 (three) times daily as needed. Take at least once per night  . hydrochlorothiazide (HYDRODIURIL) 25 MG tablet Take 1 tablet (25 mg total) by mouth daily.  . pantoprazole (PROTONIX) 20 MG tablet TAKE ONE TABLET  BY MOUTH ONE TIME DAILY  . potassium chloride SA (K-DUR,KLOR-CON) 20 MEQ tablet Take 1 tablet (20 mEq total) by mouth daily.  [name] topiramate (TOPAMAX) 25 MG tablet Take 1 tablet (25 mg total) by mouth 2 (two) times daily.  [name] trimethoprim-polymyxin b (POLYTRIM) ophthalmic solution Place 1 drop into the left eye every 4 (four) hours.  [name] zoster vaccine live, PF, (ZOSTAVAX) 16606 UNT/0.65ML injection Inject 19,400  Units into the skin once.    No results found for this or any previous visit (from the past 72 hour(s)).  Past Psychiatric History/Hospitalization(s): Anxiety: Yes Bipolar Disorder: No Depression: Yes Mania: No Psychosis: No Schizophrenia: No Personality Disorder: No Hospitalization for psychiatric illness: Yes History of Electroconvulsive Shock Therapy: No Prior Suicide Attempts: No  ROSConstitutional:  BP 156/80  Pulse 61  Ht 5' 3.5" (1.613 m)  Wt 163 lb 12.8 oz (74.299 kg)  BMI 28.56 kg/m2  Musculoskeletal: Strength & Muscle Tone: within normal limits Gait & Station: normal  Mental Status Examination;  Patient is casually dressed and fairly groomed.  She is cooperative and maintained good eye contact.  Her speech is clear and coherent.  Her thought processes logical linear and goal-directed.  She described her mood is neutral and her affect is mood appropriate.  She denies any auditory or visual hallucination.  She denies any active or passive suicidal thoughts or homicidal thoughts.  There were no delusion or psychotic symptoms present at this time.  There were no flight of idea or loose association.  Her fund of knowledge is adequate.  There no tremors or shakes present.  Her attention and concentration is fair.  She's alert and oriented x3.  Her insight judgment and impulse control is okay.   Medical Decision Making (Choose Three): Established Problem, Stable/Improving (1), Review of Psycho-Social Stressors (1), Review of Last Therapy Session (1) and Review of Medication Regimen & Side Effects (2)  Assessment: Axis I: Depressive disorder NOS  Axis II: Deferred  Axis III: See medical history  Axis IV: Moderate  Axis V: 60-65   Plan:  I will continue Remeron 30 mg and Wellbutrin XL 300 mg daily.  Risk and benefit explain in detail.  Recommend to keep appointment with her therapist.  Recommended to call us back if she has any question or any concern.  Followup in 3  months.  ARFEEN,SYED T., MD 11/02/2013

## 2013-11-27 ENCOUNTER — Other Ambulatory Visit: Payer: Self-pay | Admitting: Sports Medicine

## 2013-11-29 ENCOUNTER — Other Ambulatory Visit: Payer: Self-pay | Admitting: Sports Medicine

## 2013-12-08 ENCOUNTER — Ambulatory Visit (INDEPENDENT_AMBULATORY_CARE_PROVIDER_SITE_OTHER): Payer: Medicare Other | Admitting: Sports Medicine

## 2013-12-08 ENCOUNTER — Encounter: Payer: Self-pay | Admitting: Sports Medicine

## 2013-12-08 VITALS — BP 152/91 | HR 82 | Temp 98.9°F | Ht 63.5 in | Wt 169.0 lb

## 2013-12-08 DIAGNOSIS — N9489 Other specified conditions associated with female genital organs and menstrual cycle: Secondary | ICD-10-CM | POA: Diagnosis not present

## 2013-12-08 DIAGNOSIS — L989 Disorder of the skin and subcutaneous tissue, unspecified: Secondary | ICD-10-CM | POA: Insufficient documentation

## 2013-12-08 DIAGNOSIS — J449 Chronic obstructive pulmonary disease, unspecified: Secondary | ICD-10-CM | POA: Diagnosis not present

## 2013-12-08 DIAGNOSIS — F17200 Nicotine dependence, unspecified, uncomplicated: Secondary | ICD-10-CM | POA: Diagnosis not present

## 2013-12-08 DIAGNOSIS — I10 Essential (primary) hypertension: Secondary | ICD-10-CM

## 2013-12-08 DIAGNOSIS — G43909 Migraine, unspecified, not intractable, without status migrainosus: Secondary | ICD-10-CM

## 2013-12-08 DIAGNOSIS — N949 Unspecified condition associated with female genital organs and menstrual cycle: Secondary | ICD-10-CM

## 2013-12-08 DIAGNOSIS — E782 Mixed hyperlipidemia: Secondary | ICD-10-CM | POA: Diagnosis not present

## 2013-12-08 HISTORY — DX: Disorder of the skin and subcutaneous tissue, unspecified: L98.9

## 2013-12-08 LAB — CBC
HCT: 38.2 % (ref 36.0–46.0)
Hemoglobin: 12.7 g/dL (ref 12.0–15.0)
MCH: 28.5 pg (ref 26.0–34.0)
MCHC: 33.2 g/dL (ref 30.0–36.0)
MCV: 85.7 fL (ref 78.0–100.0)
PLATELETS: 371 10*3/uL (ref 150–400)
RBC: 4.46 MIL/uL (ref 3.87–5.11)
RDW: 13.6 % (ref 11.5–15.5)
WBC: 7 10*3/uL (ref 4.0–10.5)

## 2013-12-08 LAB — COMPREHENSIVE METABOLIC PANEL
ALBUMIN: 4 g/dL (ref 3.5–5.2)
ALK PHOS: 107 U/L (ref 39–117)
ALT: 23 U/L (ref 0–35)
AST: 29 U/L (ref 0–37)
BILIRUBIN TOTAL: 0.7 mg/dL (ref 0.2–1.2)
BUN: 10 mg/dL (ref 6–23)
CO2: 24 meq/L (ref 19–32)
Calcium: 9.5 mg/dL (ref 8.4–10.5)
Chloride: 109 mEq/L (ref 96–112)
Creat: 1.07 mg/dL (ref 0.50–1.10)
GLUCOSE: 118 mg/dL — AB (ref 70–99)
POTASSIUM: 3.7 meq/L (ref 3.5–5.3)
SODIUM: 144 meq/L (ref 135–145)
TOTAL PROTEIN: 7 g/dL (ref 6.0–8.3)

## 2013-12-08 LAB — TSH: TSH: 1.793 u[IU]/mL (ref 0.350–4.500)

## 2013-12-08 MED ORDER — TOPIRAMATE 25 MG PO TABS
25.0000 mg | ORAL_TABLET | Freq: Two times a day (BID) | ORAL | Status: DC
Start: 2013-12-08 — End: 2015-02-14

## 2013-12-08 MED ORDER — LISINOPRIL-HYDROCHLOROTHIAZIDE 10-12.5 MG PO TABS: 1.0000 | ORAL_TABLET | Freq: Every day | ORAL | Status: DC

## 2013-12-08 MED ORDER — CLONIDINE HCL 0.3 MG PO TABS: ORAL_TABLET | ORAL | Status: DC

## 2013-12-08 MED ORDER — ACYCLOVIR 200 MG PO CAPS: 200.0000 mg | ORAL_CAPSULE | Freq: Every day | ORAL | Status: DC

## 2013-12-08 MED ORDER — ALBUTEROL SULFATE HFA 108 (90 BASE) MCG/ACT IN AERS: 2.0000 | INHALATION_SPRAY | RESPIRATORY_TRACT | Status: DC

## 2013-12-08 NOTE — Assessment & Plan Note (Signed)
Restart Depakote.  Check baseline labs today.

## 2013-12-08 NOTE — Assessment & Plan Note (Signed)
Has remained quit for over a year.

## 2013-12-08 NOTE — Progress Notes (Signed)
[name] - 63 y.o. female MRN [id number]  Date of birth: 21-May-1951  Chief Complaint  Patient presents with  . Medical Managment of Chronic Issues  . Abrasion    left leg and under right breast   Problems/Dx addressed during visit General Plan and Pt Instructions  1. HYPERTENSION   2. Genital lesion, female   3. COPD (chronic obstructive pulmonary disease)   4. Migraine   5. Hyperlipidemia - Mixed       Stop the pantoprazole (Dexilant) is taking the place of this  Restart Lipitor and Depakote - refills are sent in  Start new BP pill  Use Vaseline/Aquaphor on breast and leg at least twice  Schedule a PROCEDURE appointment to get the cyst on your left shoulder removed.  We are checking labs today     HPI, INTERVAL HISTORY & ROS    She reports overall doing well.  having surgery next month for complete tooth extractions  Has remained quit smoking.  2 skin lesions on left leg and under the right breast that have been itching it.  No erythema associated with either.  No fevers, chills, nausea, vomiting.  The left breast is painful over her prior breast surgery scar.  Also reports having a left posterior shoulder cyst that is continuing to bother her and causing pain at night.  It is not draining.  Taking both Dexilant and Protonix due to confusion  Stopped Lipitor on her own due to concerns for diabetes.    For some reason is no longer on Depakote and reporting worsening occasional migraines.  That were previously well controlled.  Pertinent History & Care Coordination  Jabrea's major active medical problems include: # CVD: HTN, HLD, no prior CAD/CVA  # COPD: stable, GOLD  # Depression: Followed by Dr. Rhea Belton  # Metabolic: Impaired fasting glucose, diet controlled  Other Pertinent Med/Surg/Hosp History: #    Follow up Issues:     History  Smoking status  . Former Smoker -- 0.25 packs/day for 42 years  . Types: Cigarettes  Smokeless tobacco    . Not on file   Health Maintenance Due  Topic  . Pap Smear   . Mammogram     Recent Labs  01/21/13 0947 04/28/13 1521 04/28/13 1523 07/07/13 1336  HGBA1C  --   --  6.0  --   TRIG 100  --   --   --   CHOL 192  --   --   --   HDL 37*  --   --   --   LDLCALC 135*  --   --   --   LDLDIRECT  --  101*  --  111*       Otherwise past Medical, Surgical, Social, and Family History Reviewed per EMR Medications and Allergies reviewed and all updated if necessary. Objective Findings  VITALS: HR: 82 bpm  BP: 152/91 mmHg  TEMP: 98.9 F (37.2 C) (Oral)  RESP:    HT: 5' 3.5" (161.3 cm)  WT: 169 lb (76.658 kg)  BMI: 29.5   BP Readings from Last 3 Encounters:  12/08/13 152/91  11/02/13 156/80  10/13/13 141/87   Wt Readings from Last 3 Encounters:  12/08/13 169 lb (76.658 kg)  11/02/13 163 lb 12.8 oz (74.299 kg)  10/13/13 162 lb (73.483 kg)     PHYSICAL EXAM: GENERAL:  adult [place] American female. In no discomfort; no respiratory distress  PSYCH: alert and appropriate, good insight   HNEENT:  no  JVD   CARDIO: RRR, S1/S2 heard, no murmur  LUNGS: CTA B, no wheezes, no crackles  ABDOMEN: +BS, soft, non-tender, no rigidity, no guarding, no masses/hepatosplenomegaly  EXTREM:  Warm, well perfused.  Moves all 4 extremities spontaneously; no lateralization.  Distal pulses 2+/4.  no pretibial edema.  GU:   SKIN:  left shin: approximately 3 mm non-erythematous macule with darkened area in the center consistent with ingrown hair follicle that is not infected.   Right upper abdomen.  Patient indicates pain along the midportion of her right mastectomy scar.  Tenderness is over a hypertrophic area of the scar that has a small skin fissure associated with it.   Left posterior shoulder: Sebaceous cyst, noninflamed     Medications, Labs & Other Orders   Previous Medications   ATORVASTATIN (LIPITOR) 40 MG TABLET    TAKE 1 TABLET BY MOUTH ONCE DAILY   BUPROPION (WELLBUTRIN XL) 300 MG 24  HR TABLET    TAKE 1 TABLET BY MOUTH EVERY DAY   CLOTRIMAZOLE (LOTRIMIN) 1 % CREAM    Apply topically 2 (two) times daily.   DEXLANSOPRAZOLE (DEXILANT) 60 MG CAPSULE    Take 1 capsule (60 mg total) by mouth daily.   FLUTICASONE (FLONASE) 50 MCG/ACT NASAL SPRAY    Place 2 sprays into the nose daily.   GABAPENTIN (NEURONTIN) 100 MG CAPSULE    Take 1 capsule (100 mg total) by mouth 3 (three) times daily as needed. Take at least once per night   MIRTAZAPINE (REMERON) 30 MG TABLET    Take 1 tablet (30 mg total) by mouth at bedtime.   POTASSIUM CHLORIDE SA (K-DUR,KLOR-CON) 20 MEQ TABLET    Take 1 tablet (20 mEq total) by mouth daily.   Modified Medications   Modified Medication Previous Medication   ACYCLOVIR (ZOVIRAX) 200 MG CAPSULE acyclovir (ZOVIRAX) 200 MG capsule      Take 1 capsule (200 mg total) by mouth 5 (five) times daily.    Take 1 capsule (200 mg total) by mouth 5 (five) times daily.   ALBUTEROL (PROAIR HFA) 108 (90 BASE) MCG/ACT INHALER albuterol (PROAIR HFA) 108 (90 BASE) MCG/ACT inhaler      Inhale 2 puffs into the lungs as directed.    Inhale 2 puffs into the lungs as directed.   CLONIDINE (CATAPRES) 0.3 MG TABLET cloNIDine (CATAPRES) 0.3 MG tablet      TAKE 1& 1/2 TABLETS BY MOUTH AT BEDTIME    TAKE 1& 1/2 TABLETS BY MOUTH AT BEDTIME   TOPIRAMATE (TOPAMAX) 25 MG TABLET topiramate (TOPAMAX) 25 MG tablet      Take 1 tablet (25 mg total) by mouth 2 (two) times daily.    Take 1 tablet (25 mg total) by mouth 2 (two) times daily.   New Prescriptions   LISINOPRIL-HYDROCHLOROTHIAZIDE (PRINZIDE,ZESTORETIC) 10-12.5 MG PER TABLET    Take 1 tablet by mouth daily.   Discontinued Medications   AZITHROMYCIN (ZITHROMAX) 250 MG TABLET    2 tablets by mouth today then 1 tablet daily   BENZONATATE (TESSALON) 200 MG CAPSULE    Take 1 capsule (200 mg total) by mouth 2 (two) times daily as needed for cough.   HYDROCHLOROTHIAZIDE (HYDRODIURIL) 25 MG TABLET    Take 1 tablet (25 mg total) by mouth daily.    PANTOPRAZOLE (PROTONIX) 20 MG TABLET    TAKE 1 TABLET BY MOUTH EVERY DAY   TRIMETHOPRIM-POLYMYXIN B (POLYTRIM) OPHTHALMIC SOLUTION    Place 1 drop into the left eye every 4 (four)  hours.   ZOSTER VACCINE LIVE, PF, (ZOSTAVAX) 12458 UNT/0.65ML INJECTION    Inject 19,400 Units into the skin once.   Orders Placed This Encounter  Procedures  . Comprehensive metabolic panel  . CBC  . TSH  . Lipid panel   Assessment & Plan  As above & for further discussion see problem based charting if applicable.

## 2013-12-08 NOTE — Assessment & Plan Note (Signed)
Increase to combined lisinopril HCTZ.  Continue clonidine.

## 2013-12-08 NOTE — Assessment & Plan Note (Signed)
Recheck lipid panel. Restart Lipitor.  Counseling provided and patient agreeable.

## 2013-12-08 NOTE — Patient Instructions (Addendum)
   Stop the pantoprazole (Dexilant) is taking the place of this  Restart Lipitor and Depakote - refills are sent in  Use Vaseline/Aquaphor on breast and leg at least twice  Schedule a PROCEDURE appointment to get the cyst on your left shoulder removed.  We are checking labs today   If you need anything prior to your next visit please call the clinic. Please Bring all medications or accurate medication list with you to each appointment; an accurate medication list is essential in providing you the best care possible.

## 2013-12-08 NOTE — Assessment & Plan Note (Signed)
Multiple lesions addressed today. - Right breast/abdomen and left shin noninfected lesions.  Recommend twice daily moisturizing ointment and scar massage.  If not improved in 2 weeks patient will return. - Patient to return for a left sebaceous cyst removal

## 2013-12-12 ENCOUNTER — Encounter: Payer: Self-pay | Admitting: Sports Medicine

## 2013-12-26 ENCOUNTER — Ambulatory Visit: Payer: Self-pay | Admitting: Sports Medicine

## 2014-01-12 ENCOUNTER — Other Ambulatory Visit: Payer: Self-pay | Admitting: Sports Medicine

## 2014-01-23 ENCOUNTER — Ambulatory Visit: Payer: Self-pay | Admitting: Family Medicine

## 2014-01-25 ENCOUNTER — Other Ambulatory Visit (HOSPITAL_COMMUNITY): Payer: Self-pay | Admitting: Psychiatry

## 2014-01-25 DIAGNOSIS — F3289 Other specified depressive episodes: Secondary | ICD-10-CM

## 2014-01-25 DIAGNOSIS — F329 Major depressive disorder, single episode, unspecified: Secondary | ICD-10-CM

## 2014-02-06 ENCOUNTER — Ambulatory Visit (HOSPITAL_COMMUNITY): Payer: Self-pay | Admitting: Psychiatry

## 2014-02-06 ENCOUNTER — Ambulatory Visit (INDEPENDENT_AMBULATORY_CARE_PROVIDER_SITE_OTHER): Payer: Medicare Other | Admitting: Sports Medicine

## 2014-02-06 ENCOUNTER — Encounter: Payer: Self-pay | Admitting: Sports Medicine

## 2014-02-06 VITALS — BP 168/96 | HR 77 | Temp 98.3°F | Ht 63.5 in | Wt 167.0 lb

## 2014-02-06 DIAGNOSIS — I10 Essential (primary) hypertension: Secondary | ICD-10-CM

## 2014-02-06 DIAGNOSIS — J449 Chronic obstructive pulmonary disease, unspecified: Secondary | ICD-10-CM

## 2014-02-06 DIAGNOSIS — L989 Disorder of the skin and subcutaneous tissue, unspecified: Secondary | ICD-10-CM

## 2014-02-06 LAB — C-REACTIVE PROTEIN: CRP: 0.8 mg/dL — AB (ref <0.60)

## 2014-02-06 LAB — RHEUMATOID FACTOR

## 2014-02-06 LAB — POCT SEDIMENTATION RATE: POCT SED RATE: 28 mm/h — AB (ref 0–22)

## 2014-02-06 MED ORDER — VARENICLINE TARTRATE 0.5 MG X 11 & 1 MG X 42 PO MISC: ORAL | Status: DC

## 2014-02-06 MED ORDER — DOXYCYCLINE HYCLATE 100 MG PO TABS: 100.0000 mg | ORAL_TABLET | Freq: Two times a day (BID) | ORAL | Status: DC

## 2014-02-06 NOTE — Patient Instructions (Signed)
It was good to see you today. We are checking labs and we'll review these at her next visit. Please take antibiotic for the next 10 days. Followup in 2 weeks for a biopsy of the skin and for removal of the left shoulder cyst. Please continue taking her other medications.   I encourage you to call [phone] quit now to help you quit smoking.  I have sent in a prescription for Chantix.  Please follow the instructions on the box.

## 2014-02-06 NOTE — Assessment & Plan Note (Signed)
Problem Based Documentation:    Subjective Report:  Patient presents with acute on chronic worsening of a left-sided skin lesions.  She is worried for potential autoimmune etiology as her mother had both lupus and rheumatoid arthritis.  She has had significant joint aches, and night sweats in the past and worsening PND.     Assessment & Plan & Follow up Issues:  Acute on chronic condition - Overall clinical presentation is worrisome for a potential autoimmune etiology versus systemic process. 1. Obtain labs for SLE, Sjogren's/crest, rheumatoid 2. 10 days of antibiotics given surrounding erythema. > Return in 2 weeks for potential biopsy to evaluate for sarcoid in for cyst removal > Consider further autoimmune workup if remainder of workup unrevealing > Consider potential medication effects including clonidine associated SLE

## 2014-02-06 NOTE — Progress Notes (Signed)
ZABDI MIS - 63 y.o. female MRN [id number]  Date of birth: [birth year]  SUBJECTIVE:     CC: Follow-up See problem based charting for additional subjective (including HPI, Interval History & ROS)   She is here today with her daughter.   HISTORY: Darnita's major active medical problems include: # CVD: HTN, HLD, no prior CAD/CVA  # COPD: stable, GOLD  # Depression: Followed by Dr. Rhea Belton  # Metabolic: Impaired fasting glucose, diet controlled  Other Pertinent Med/Surg/Hosp History: #    Follow up Issues:      Recent Labs  04/28/13 1521 04/28/13 1523 07/07/13 1336 12/08/13 1212  HGBA1C  --  6.0  --   --   LDLDIRECT 101*  --  111*  --   TSH  --   --   --  1.793  } Wt Readings from Last 3 Encounters:  02/06/14 167 lb (75.751 kg)  12/08/13 169 lb (76.658 kg)  11/02/13 163 lb 12.8 oz (74.299 kg)   BP Readings from Last 3 Encounters:  02/06/14 168/96  12/08/13 152/91  11/02/13 156/80    History  Smoking status  . Former Smoker -- 0.25 packs/day for 42 years  . Types: Cigarettes  . Quit date: 10/27/2012  Smokeless tobacco  . Not on file   Health Maintenance Due  Topic  . Pap Smear   . Mammogram     Otherwise past Medical, Surgical, Social, and Family History Reviewed per EMR Medications and Allergies reviewed and updated per below.  VITALS: BP 168/96  Pulse 77  Temp(Src) 98.3 F (36.8 C) (Oral)  Ht 5' 3.5" (1.613 m)  Wt 167 lb (75.751 kg)  BMI 29.12 kg/m2  PHYSICAL EXAM: GENERAL: Adult African [place]  female.appears overall upset but in no discomfort; no respiratory distress  PSYCH: alert and appropriate, good insight   HNEENT:  no JVD, mild hepatojugular reflux   CARDIO: RRR, S1/S2 heard, no murmur  LUNGS: CTA B, mild and expiratory wheezes, no crackles  ABDOMEN: +BS, soft, non-tender, midline scar with umbilical hernia that is easily is easily reducible, no rigidity, no guarding,   EXTREM:  Warm, well perfused.  Moves all 4 extremities  spontaneously; no lateralization.  Pedal pulses 2/4.  Trace pretibial edema.  GU:   SKIN:  left shoulder: Mildly Inflamed sebaceous cyst  Left forearm: Acute on chronic hyperkeratotic healing skin ulceration with hyperpigmentation with surrounding erythema Left anterior shin: Acute on chronic hyperkeratotic healing skin ulcer with hyperpigmentation with slightly erythematous characteristics     MEDICATIONS, LABS & OTHER ORDERS: Previous Medications   ACYCLOVIR (ZOVIRAX) 200 MG CAPSULE    Take 1 capsule (200 mg total) by mouth 5 (five) times daily.   ALBUTEROL (PROAIR HFA) 108 (90 BASE) MCG/ACT INHALER    Inhale 2 puffs into the lungs as directed.   ATORVASTATIN (LIPITOR) 40 MG TABLET    TAKE 1 TABLET BY MOUTH ONCE DAILY   BUPROPION (WELLBUTRIN XL) 300 MG 24 HR TABLET    TAKE 1 TABLET BY MOUTH EVERY DAY   CLONIDINE (CATAPRES) 0.3 MG TABLET    TAKE 1& 1/2 TABLETS BY MOUTH AT BEDTIME   CLOTRIMAZOLE (LOTRIMIN) 1 % CREAM    Apply topically 2 (two) times daily.   DEXLANSOPRAZOLE (DEXILANT) 60 MG CAPSULE    Take 1 capsule (60 mg total) by mouth daily.   FLUTICASONE (FLONASE) 50 MCG/ACT NASAL SPRAY    Place 2 sprays into the nose daily.   GABAPENTIN (NEURONTIN) 100 MG CAPSULE  TAKE 1 CAPSULE BY MOUTH THREE TIMES DAILY AS NEEDED. TAKE AT LEAST ONCE PER NIGHT AS DIRECTED   LISINOPRIL-HYDROCHLOROTHIAZIDE (PRINZIDE,ZESTORETIC) 10-12.5 MG PER TABLET    Take 1 tablet by mouth daily.   MIRTAZAPINE (REMERON) 30 MG TABLET    TAKE 1 TABLET BY MOUTH EVERY NIGHT AT BEDTIME   POTASSIUM CHLORIDE SA (K-DUR,KLOR-CON) 20 MEQ TABLET    Take 1 tablet (20 mEq total) by mouth daily.   TOPIRAMATE (TOPAMAX) 25 MG TABLET    Take 1 tablet (25 mg total) by mouth 2 (two) times daily.   Modified Medications   No medications on file   New Prescriptions   DOXYCYCLINE (VIBRA-TABS) 100 MG TABLET    Take 1 tablet (100 mg total) by mouth 2 (two) times daily.   VARENICLINE (CHANTIX PAK) 0.5 MG X 11 & 1 MG X 42 TABLET    Take one  0.5 mg tablet po qd 3 days, then increase to one 0.5 mg tablet bid for 4 days, then increase to one 1 mg tablet bid   Discontinued Medications   No medications on file   Orders Placed This Encounter  Procedures  . C-reactive protein  . Rheumatoid factor  . Complement, total  . Sjogren's syndrome antibods(ssa + ssb)  . Pro b natriuretic peptide  . ANA  . POCT SEDIMENTATION RATE   ASSESSMENT & PLAN: See problem based charting & AVS for pt instructions.

## 2014-02-06 NOTE — Assessment & Plan Note (Signed)
Problem Based Documentation:    Subjective Report:  Patient reports persistent PND without significant dyspnea on exertion, chest pain, orthopnea or then diarrhea.     Assessment & Plan & Follow up Issues:  Chronic poorly controlled condition 1. Patient is currently under significant stress and is emotional on exam. 2. Defer any changes to medications at this time but consider need to discontinue clonidine for potential medication associated SLE

## 2014-02-06 NOTE — Assessment & Plan Note (Addendum)
Problem Based Documentation:    Subjective Report:  Patient continues to smoke and is reporting some persistent respiratory symptoms.  Prior x-ray from 2013 reviewed but did not have any remarkable findings.  No significant change in her current symptoms.     Assessment & Plan & Follow up Issues:  Chronic condition 1. Once again spent time encouraging smoking cessation.  She does report being interested in ready to quit. 2. Trial of Chantix with close followup given psychiatric history. 3. Stressed the importance of this given potential autoimmune etiology is only likely worsened by smoking > Consider potential for sarcoidosis as underlying etiology given difficulty in control and new cutaneous findings

## 2014-02-07 ENCOUNTER — Telehealth: Payer: Self-pay | Admitting: *Deleted

## 2014-02-07 LAB — SJOGREN'S SYNDROME ANTIBODS(SSA + SSB)
SSA (Ro) (ENA) Antibody, IgG: <1
SSB (La) (ENA) Antibody, IgG: <1

## 2014-02-07 LAB — ANA: Anti Nuclear Antibody(ANA): NEGATIVE

## 2014-02-07 LAB — PRO B NATRIURETIC PEPTIDE: Pro B Natriuretic peptide (BNP): 361.7 pg/mL — ABNORMAL HIGH (ref <126)

## 2014-02-07 NOTE — Telephone Encounter (Signed)
PA for Chantix faxed to [facility] for review.  Derl Barrow, RN

## 2014-02-07 NOTE — Telephone Encounter (Signed)
Prior authorization for Chantix starting month PAK 53's received from [facility].  PA placed in provider box for review.  Derl Barrow, RN

## 2014-02-07 NOTE — Telephone Encounter (Signed)
PA completed.  Returned to [employer].  Thanks, [employer]. Alexsandro Salek, M.D.

## 2014-02-08 NOTE — Telephone Encounter (Signed)
PA approved for Chantix from 02/08/2014 - 05/01/2014.  [facility] informed.  Derl Barrow, RN

## 2014-02-09 LAB — COMPLEMENT, TOTAL

## 2014-02-14 ENCOUNTER — Ambulatory Visit (HOSPITAL_COMMUNITY): Payer: Self-pay | Admitting: Psychiatry

## 2014-02-15 ENCOUNTER — Ambulatory Visit (INDEPENDENT_AMBULATORY_CARE_PROVIDER_SITE_OTHER): Payer: Medicare Other | Admitting: Family Medicine

## 2014-02-15 ENCOUNTER — Encounter: Payer: Self-pay | Admitting: Family Medicine

## 2014-02-15 VITALS — BP 161/95 | HR 77 | Ht 63.5 in | Wt 166.5 lb

## 2014-02-15 DIAGNOSIS — L723 Sebaceous cyst: Secondary | ICD-10-CM

## 2014-02-15 DIAGNOSIS — L720 Epidermal cyst: Secondary | ICD-10-CM

## 2014-02-15 MED ORDER — HYDROCODONE-ACETAMINOPHEN 5-325 MG PO TABS: 1.0000 | ORAL_TABLET | Freq: Four times a day (QID) | ORAL | Status: DC | PRN

## 2014-02-15 NOTE — Patient Instructions (Addendum)
Incision and Drainage   Care After   Refer to this sheet in the next few weeks. These instructions provide you with information on caring for yourself after your procedure. Your caregiver may also give you more specific instructions. Your treatment has been planned according to current medical practices, but problems sometimes occur. Call your caregiver if you have any problems or questions after your procedure.   HOME CARE INSTRUCTIONS   If antibiotic medicine is given, take it as directed. Finish it even if you start to feel better.   Only take over-the-counter or prescription medicines for pain, discomfort, or fever as directed by your caregiver.   Keep all follow-up appointments as directed by your caregiver.   Change any bandages (dressings) as directed by your caregiver. Replace old dressings with clean dressings.   Wash your hands before and after caring for your wound.  You will receive specific instructions for cleansing and caring for your wound.   SEEK MEDICAL CARE IF:   You have increased pain, swelling, or redness around the wound.   You have increased drainage, smell, or bleeding from the wound.   You have muscle aches, chills, or you feel generally sick.   You have a fever.  MAKE SURE YOU:   Understand these instructions.   Will watch your condition.   Will get help right away if you are not doing well or get worse.  Document Released: 01/05/2012 Document Reviewed: 01/05/2012   Exit[employer]® Patient Information ©2014 [employer].

## 2014-02-17 ENCOUNTER — Encounter: Payer: Self-pay | Admitting: Family Medicine

## 2014-02-17 ENCOUNTER — Ambulatory Visit (INDEPENDENT_AMBULATORY_CARE_PROVIDER_SITE_OTHER): Payer: Medicare Other | Admitting: Family Medicine

## 2014-02-17 VITALS — BP 168/94 | HR 76 | Temp 98.1°F | Ht 63.5 in | Wt 165.5 lb

## 2014-02-17 DIAGNOSIS — L723 Sebaceous cyst: Secondary | ICD-10-CM | POA: Diagnosis not present

## 2014-02-17 DIAGNOSIS — R112 Nausea with vomiting, unspecified: Secondary | ICD-10-CM | POA: Diagnosis not present

## 2014-02-17 DIAGNOSIS — L720 Epidermal cyst: Secondary | ICD-10-CM

## 2014-02-17 DIAGNOSIS — I10 Essential (primary) hypertension: Secondary | ICD-10-CM

## 2014-02-17 MED ORDER — CLONIDINE HCL 0.3 MG PO TABS: ORAL_TABLET | ORAL | Status: DC

## 2014-02-17 NOTE — Patient Instructions (Signed)
Mrs. Fredrick,  Thank you for coming in today. Your wound is healing well.  I trimmed the packing. Please keep the area clean and dry. Return early next week- recommend Monday for repeat wound check and packing removal.   Regarding nausea, headache, elevated BP. I agree that the antibiotic could be the cause of of nausea and emesis.  Since you have not had repeat emesis and have completed the antibiotic no additional evaluation is needed at this time.   Your elevated blood pressure with headache is concerning. Your BP was elevated last visit, but this is common in pain.  Since you are still elevated I ercommend increasing clonidine to half tab in the morning (take when you get home) along with 1 and half tab in the evening. Your blood pressure will be rechecked at your f/u appointment.  Excellent job with smoking cessation!.   F/u in the morning of Tuesday April 28th with Dr. Paulla Fore- 15 min wound check and BP f/u.   Dr. Adrian Blackwater

## 2014-02-17 NOTE — Assessment & Plan Note (Signed)
A: s/p I&D, wound is healing well by secondary intention. Trimmed packing today. No signs of bacterial superinfection or cellulitis.  P: RTC in 4 days for repeat wound check and packing removal.

## 2014-02-17 NOTE — Progress Notes (Signed)
   Subjective:    Patient ID: [name], female    DOB: [birth year], 63 y.o.   MRN: [id number] CC: F/u I &D HPI 63 year old female presents with her daughter for followup of visit to discuss the following:  #1 right shoulder sebaceous cyst: Patient is status post rupture of right shoulder sebaceous cyst. She had I&D a ruptured cyst on 02/15/2014. She was seen by Dr. Sherril Cong. I assisted Dr. Sherril Cong during the visit. Patient's dressing and packing is still in place. She denies pain. She denies fever chills. She's completed course of doxycycline. She has not to take Vicodin.  #2 nausea: Patient reports nausea associated with an episode of emesis last night after eating a hamburger. She denies sick contacts, fever, abdominal pain, dysuria, diarrhea. She admits to constipation.  #3 elevated blood pressure: Patient has no history of hypertension is compliant with clonidine and Prinzide. She takes her antihypertensive nightly. She admits to headache this morning. Of note her blood pressures been elevated above goal for the last 4 office visits. She did quit smoking on 02/06/2014.  Social history: Former smoker quit date 02/06/2014.  Review of Systems As per history of present illness    Objective:   Physical Exam BP 168/94  Pulse 76  Temp(Src) 98.1 F (36.7 C) (Oral)  Ht 5' 3.5" (1.613 m)  Wt 165 lb 8 oz (75.07 kg)  BMI 28.85 kg/m2 General appearance: alert, cooperative and no distress Lungs: clear to auscultation bilaterally Heart: regular rate and rhythm, S1, S2 normal, no murmur, click, rub or gallop Abdomen: obese, round, NABS, Non tender  Extremities: extremities normal, atraumatic, no cyanosis or edema Skin: R shoulder wound: 2 x2 cm circular area of erythema, no streaking, mild tenderness, central incision 1 cm with packing tail stick out. sanguineous drainage on packing. no purulence or odor.   Trimmed packing tail leaving 1.5 cm hanging out. Redressed wound.     Assessment  & Plan:

## 2014-02-17 NOTE — Assessment & Plan Note (Signed)
A: elevated BP with headache.  P: Increased clonidine from 0.45 mg q PM to 0.15 mg q AM and 0.45 mg q PM Continue prinzide at current dose RTC in 4 days for PCP f/u.

## 2014-02-17 NOTE — Assessment & Plan Note (Signed)
A: nausea with emesis x one episode. No fever with reassuring exam. Possibly GI upset from doxycycline. Patient has completed course.  P: Reassurance F/u prn.

## 2014-02-20 NOTE — Progress Notes (Signed)
   Subjective:    Patient ID: [name], female    DOB: [birth year], 63 y.o.   MRN: [id number]  HPI Pt presents for f/u of inflamed sebaceous cyst on her left shoulder. She has been taking her antibiotics as prescribed. Yesterday evening she put fat back on the cyst to help "bring it to a head." When she woke up the next morning she reports the fatback had turned black and the cyst had opened and had purulent drainage. She reports it is exquisitely painful. She denies fevers.    Review of Systems  Constitutional: Negative for fever and chills.  Skin: Positive for wound.  All other systems reviewed and are negative.      Objective:   Physical Exam  Nursing note and vitals reviewed. Constitutional: She is oriented to person, place, and time. She appears well-developed and well-nourished. No distress.  HENT:  Head: Normocephalic and atraumatic.  Right Ear: External ear normal.  Left Ear: External ear normal.  Eyes: Conjunctivae are normal. Right eye exhibits no discharge. Left eye exhibits no discharge. No scleral icterus.  Neck: Normal range of motion.  Cardiovascular: Normal rate.   Pulmonary/Chest: Effort normal.  Musculoskeletal: Normal range of motion. She exhibits no edema.  Neurological: She is alert and oriented to person, place, and time.  Skin: Skin is warm and dry. Lesion noted. No rash noted. She is not diaphoretic.     Psychiatric: She has a normal mood and affect. Her behavior is normal.          Assessment & Plan:

## 2014-02-20 NOTE — Assessment & Plan Note (Addendum)
Inflamed cyst, ~2x3cm on L posterior shoulder - incision and drainage with removal of cyst capsule today - continue antibiotics course - vicodin #10 for pain control - f/u in clinic on Friday for wound check

## 2014-02-21 ENCOUNTER — Ambulatory Visit (INDEPENDENT_AMBULATORY_CARE_PROVIDER_SITE_OTHER): Payer: Medicare Other | Admitting: Sports Medicine

## 2014-02-21 ENCOUNTER — Encounter: Payer: Self-pay | Admitting: Sports Medicine

## 2014-02-21 VITALS — BP 124/78 | HR 75 | Temp 98.2°F | Ht 63.5 in | Wt 167.0 lb

## 2014-02-21 DIAGNOSIS — L720 Epidermal cyst: Secondary | ICD-10-CM

## 2014-02-21 DIAGNOSIS — I10 Essential (primary) hypertension: Secondary | ICD-10-CM

## 2014-02-21 DIAGNOSIS — L989 Disorder of the skin and subcutaneous tissue, unspecified: Secondary | ICD-10-CM | POA: Diagnosis not present

## 2014-02-21 DIAGNOSIS — L723 Sebaceous cyst: Secondary | ICD-10-CM

## 2014-02-21 DIAGNOSIS — F17200 Nicotine dependence, unspecified, uncomplicated: Secondary | ICD-10-CM

## 2014-02-21 MED ORDER — FLUCONAZOLE 150 MG PO TABS: 150.0000 mg | ORAL_TABLET | Freq: Once | ORAL | Status: DC

## 2014-02-21 MED ORDER — DOXYCYCLINE HYCLATE 100 MG PO TABS: 100.0000 mg | ORAL_TABLET | Freq: Two times a day (BID) | ORAL | Status: DC

## 2014-02-21 NOTE — Assessment & Plan Note (Signed)
Followup procedure visit for biopsy of left volar forearm for consideration of sarcoidosis

## 2014-02-21 NOTE — Assessment & Plan Note (Signed)
Problem Based Documentation:    Subjective Report:  Pt denies chest pain, dyspnea at rest or exertion, PND, lower extremity edema.  Patient denies any facial asymmetry, unilateral weakness, or dysarthria.     Assessment & Plan & Follow up Issues:  Chronic condition, improved control 1. Titrated meds at last visit with improved BP .

## 2014-02-21 NOTE — Patient Instructions (Signed)
Keep the area clean by taking daily showers.  Apply Vaseline followed by a Band-Aid. Continue antibiotics for an additional 10 days.  I have sent in a prescription for yeast infection. Followup in 2 weeks for excision of the cyst and biopsy of your skin.  Great job on quitting smoking

## 2014-02-21 NOTE — Assessment & Plan Note (Signed)
Infected cyst improved.  Packing removed, small shallow open lesion. Daily showers, Vaseline, keep covered with Band-Aid. > Follow up return for definitive excision of surrounding cysts

## 2014-02-21 NOTE — Progress Notes (Signed)
[name] - 62 y.o. female MRN [id number]  Date of birth: 09-19-1951  CC: Cyst, Vaginal Itching, Follow-up and Nicotine Dependence   SUBJECTIVE:     HPI Comments: Patient presents with:   Cyst - follow up excision, off ABX   Vaginal Itching   Follow-up - lab results and chronic skin/pain symptoms and hypertension   Nicotine Dependence - none since last visit.  started chantix on Friday  The patient reports the cyst that was drained last week continues to be painful.  It is still packed .  She is not having any fevers, chills, nausea, vomiting.  She does report some associated vaginal itching that she feels is due to the antibiotic she has been on.  Good compliance with oral antibiotic.  She has been keeping area clean and dry  She has been smoke free since our last meeting.  She is using Chantix.  She has an upcoming appointment with her psychiatrist in 1-2 weeks. See problem based charting for additional problem specific subjective (including HPI, Interval History & ROS)   HISTORY: History  Smoking status  . Former Smoker -- 0.25 packs/day for 42 years  . Types: Cigarettes  . Quit date: 02/06/2014  Smokeless tobacco  . Not on file   Otherwise past Medical, Surgical, Social, and Family History Reviewed per EMR Medications and Allergies reviewed and updated per below.  OBJECTIVE: VITALS: BP: 124/78 mmHg  HR: 75 bpm  TEMP: 98.2 F (36.8 C) (Oral)  RESP:    HT: 5' 3.5" (161.3 cm)  WT: 167 lb (75.751 kg)  BMI: 29.2   BP Readings from Last 3 Encounters:  02/23/14 163/90  02/21/14 124/78  02/17/14 168/94   Wt Readings from Last 3 Encounters:  02/23/14 166 lb 9.6 oz (75.569 kg)  02/21/14 167 lb (75.751 kg)  02/17/14 165 lb 8 oz (75.07 kg)     Physical Exam  Vitals reviewed. Constitutional: She is well-developed, well-nourished, and in no distress.  HENT:  Head: Normocephalic and atraumatic.  Right Ear: External ear normal.  Left Ear: External ear normal.    Cardiovascular: Normal rate, regular rhythm and normal heart sounds.  Exam reveals no gallop and no friction rub.   No murmur heard. Pulmonary/Chest: Effort normal and breath sounds normal. No respiratory distress.  Musculoskeletal: She exhibits no edema and no tenderness.  Neurological: She is alert.  Moves all 4 extremities spontaneously; no lateralization.  Skin: Skin is warm and dry. She is not diaphoretic.     Psychiatric: Mood, memory, affect and judgment normal.   MEDICATIONS, LABS & OTHER ORDERS: Previous Medications   ACYCLOVIR (ZOVIRAX) 200 MG CAPSULE    Take 1 capsule (200 mg total) by mouth 5 (five) times daily.   ALBUTEROL (PROAIR HFA) 108 (90 BASE) MCG/ACT INHALER    Inhale 2 puffs into the lungs as directed.   ATORVASTATIN (LIPITOR) 40 MG TABLET    TAKE 1 TABLET BY MOUTH ONCE DAILY   CLONIDINE (CATAPRES) 0.3 MG TABLET    TAKE 1/2 TABLET IN THE MORNING AND 1& 1/2 TABLETS BY MOUTH AT BEDTIME   CLOTRIMAZOLE (LOTRIMIN) 1 % CREAM    Apply topically 2 (two) times daily.   DEXLANSOPRAZOLE (DEXILANT) 60 MG CAPSULE    Take 1 capsule (60 mg total) by mouth daily.   FLUTICASONE (FLONASE) 50 MCG/ACT NASAL SPRAY    Place 2 sprays into the nose daily.   GABAPENTIN (NEURONTIN) 100 MG CAPSULE    TAKE 1 CAPSULE BY MOUTH THREE TIMES  DAILY AS NEEDED. TAKE AT LEAST ONCE PER NIGHT AS DIRECTED   LISINOPRIL-HYDROCHLOROTHIAZIDE (PRINZIDE,ZESTORETIC) 10-12.5 MG PER TABLET    Take 1 tablet by mouth daily.   POTASSIUM CHLORIDE SA (K-DUR,KLOR-CON) 20 MEQ TABLET    Take 1 tablet (20 mEq total) by mouth daily.   TOPIRAMATE (TOPAMAX) 25 MG TABLET    Take 1 tablet (25 mg total) by mouth 2 (two) times daily.   VARENICLINE (CHANTIX PAK) 0.5 MG X 11 & 1 MG X 42 TABLET    Take one 0.5 mg tablet po qd 3 days, then increase to one 0.5 mg tablet bid for 4 days, then increase to one 1 mg tablet bid   Modified Medications   Modified Medication Previous Medication   BUPROPION (WELLBUTRIN XL) 300 MG 24 HR TABLET  buPROPion (WELLBUTRIN XL) 300 MG 24 hr tablet      TAKE 1 TABLET BY MOUTH EVERY DAY    TAKE 1 TABLET BY MOUTH EVERY DAY   DOXYCYCLINE (VIBRA-TABS) 100 MG TABLET doxycycline (VIBRA-TABS) 100 MG tablet      Take 1 tablet (100 mg total) by mouth 2 (two) times daily.    Take 1 tablet (100 mg total) by mouth 2 (two) times daily.   MIRTAZAPINE (REMERON) 30 MG TABLET mirtazapine (REMERON) 30 MG tablet      TAKE 1 TABLET BY MOUTH EVERY NIGHT AT BEDTIME    TAKE 1 TABLET BY MOUTH EVERY NIGHT AT BEDTIME   New Prescriptions   FLUCONAZOLE (DIFLUCAN) 150 MG TABLET    Take 1 tablet (150 mg total) by mouth once.   Discontinued Medications   No medications on file  No orders of the defined types were placed in this encounter.   ASSESSMENT & PLAN: See problem based charting & AVS for pt instructions.

## 2014-02-22 ENCOUNTER — Ambulatory Visit: Payer: Self-pay | Admitting: Sports Medicine

## 2014-02-22 ENCOUNTER — Ambulatory Visit (HOSPITAL_COMMUNITY): Payer: Self-pay | Admitting: Psychiatry

## 2014-02-23 ENCOUNTER — Ambulatory Visit (INDEPENDENT_AMBULATORY_CARE_PROVIDER_SITE_OTHER): Payer: Medicare Other | Admitting: Psychiatry

## 2014-02-23 ENCOUNTER — Encounter (HOSPITAL_COMMUNITY): Payer: Self-pay | Admitting: Psychiatry

## 2014-02-23 VITALS — BP 163/90 | HR 67 | Ht 63.5 in | Wt 166.6 lb

## 2014-02-23 DIAGNOSIS — F329 Major depressive disorder, single episode, unspecified: Secondary | ICD-10-CM

## 2014-02-23 DIAGNOSIS — F3289 Other specified depressive episodes: Secondary | ICD-10-CM

## 2014-02-23 MED ORDER — MIRTAZAPINE 30 MG PO TABS: ORAL_TABLET | ORAL | Status: DC

## 2014-02-23 MED ORDER — BUPROPION HCL ER (XL) 300 MG PO TB24: ORAL_TABLET | ORAL | Status: DC

## 2014-02-23 NOTE — Progress Notes (Signed)
[place] Progress Note  POLA FURNO [id number] 63 y.o.  02/23/2014 2:07 PM  Chief Complaint:  Medication management and followup.      History of Present Illness:   [name] came for her followup appointment.  She is compliant with her Remeron and Wellbutrin.  Recently she has seen her primary care physician multiple times .  The patient believes she may have lupus .  She is still in the process of getting blood work.  She continues to have chronic depression but overall she feels her medicine is working very well.  She denies any irritability, anger, mood swing.  She denies any side effects of medication.  She is sleeping better.  Her appetite is good.  She denies any recent crying spells or any feeling of hopelessness or worthlessness.  Patient is not seeing a therapist because her therapist left the practice.  She is interested to see a new therapist in this office.  Patient is not drinking alcohol or using any illegal substances.  Suicidal Ideation: No Plan Formed: No Patient has means to carry out plan: No  Homicidal Ideation: No Plan Formed: No Patient has means to carry out plan: No  Review of Systems: Psychiatric: Agitation: No Hallucination: No Depressed Mood: No Insomnia: No Hypersomnia: No Altered Concentration: No Feels Worthless: No Grandiose Ideas: No Belief In Special Powers: No New/Increased Substance Abuse: No Compulsions: No  Neurologic: Headache: Yes Seizure: No Paresthesias: No  Past Psychiatric History;  She has history of psychiatric inpatient treatment at [facility].  She was admitted to get detox from her Xanax.  Patient also seeing psychiatrist when she was in [employer] in [place] in [place] and than [facility].  In the past she had tried Paxil Prozac Zoloft.  Patient denies any history of suicidal attempt or any psychosis.  Medical History;  She has history of hypertension, migraine  headache, asthma, GERD, chronic pain.  Recently her migraine headaches were worse and she start taking Topamax.  Her primary care physician is Dr. Paulla Fore.   Outpatient Encounter Prescriptions as of 02/23/2014  Medication Sig  . buPROPion (WELLBUTRIN XL) 300 MG 24 hr tablet TAKE 1 TABLET BY MOUTH EVERY DAY  . mirtazapine (REMERON) 30 MG tablet TAKE 1 TABLET BY MOUTH EVERY NIGHT AT BEDTIME  . [DISCONTINUED] buPROPion (WELLBUTRIN XL) 300 MG 24 hr tablet TAKE 1 TABLET BY MOUTH EVERY DAY  . [DISCONTINUED] mirtazapine (REMERON) 30 MG tablet TAKE 1 TABLET BY MOUTH EVERY NIGHT AT BEDTIME  . acyclovir (ZOVIRAX) 200 MG capsule Take 1 capsule (200 mg total) by mouth 5 (five) times daily.  [name] albuterol (PROAIR HFA) 108 (90 BASE) MCG/ACT inhaler Inhale 2 puffs into the lungs as directed.  [name] atorvastatin (LIPITOR) 40 MG tablet TAKE 1 TABLET BY MOUTH ONCE DAILY  . cloNIDine (CATAPRES) 0.3 MG tablet TAKE 1/2 TABLET IN THE MORNING AND 1& 1/2 TABLETS BY MOUTH AT BEDTIME  . clotrimazole (LOTRIMIN) 1 % cream Apply topically 2 (two) times daily.  [name] dexlansoprazole (DEXILANT) 60 MG capsule Take 1 capsule (60 mg total) by mouth daily.  [name] doxycycline (VIBRA-TABS) 100 MG tablet Take 1 tablet (100 mg total) by mouth 2 (two) times daily.  . fluconazole (DIFLUCAN) 150 MG tablet Take 1 tablet (150 mg total) by mouth once.  . fluticasone (FLONASE) 50 MCG/ACT nasal spray Place 2 sprays into the nose daily.  [name] gabapentin (NEURONTIN) 100 MG capsule TAKE 1 CAPSULE BY MOUTH THREE TIMES DAILY AS  NEEDED. TAKE AT LEAST ONCE PER NIGHT AS DIRECTED  . lisinopril-hydrochlorothiazide (PRINZIDE,ZESTORETIC) 10-12.5 MG per tablet Take 1 tablet by mouth daily.  . potassium chloride SA (K-DUR,KLOR-CON) 20 MEQ tablet Take 1 tablet (20 mEq total) by mouth daily.  [name] topiramate (TOPAMAX) 25 MG tablet Take 1 tablet (25 mg total) by mouth 2 (two) times daily.  . varenicline (CHANTIX PAK) 0.5 MG X 11 & 1 MG X 42 tablet Take one 0.5 mg tablet po qd 3  days, then increase to one 0.5 mg tablet bid for 4 days, then increase to one 1 mg tablet bid    No results found for this or any previous visit (from the past 72 hour(s)).  Past Psychiatric History/Hospitalization(s): Anxiety: Yes Bipolar Disorder: No Depression: Yes Mania: No Psychosis: No Schizophrenia: No Personality Disorder: No Hospitalization for psychiatric illness: Yes History of Electroconvulsive Shock Therapy: No Prior Suicide Attempts: No  ROSConstitutional:  BP 163/90  Pulse 67  Ht 5' 3.5" (1.613 m)  Wt 166 lb 9.6 oz (75.569 kg)  BMI 29.05 kg/m2  Musculoskeletal: Strength & Muscle Tone: within normal limits Gait & Station: normal  Mental Status Examination;  Patient is casually dressed and fairly groomed.  She is cooperative and maintained good eye contact.  Her speech is clear and coherent.  Her thought processes logical linear and goal-directed.  She described her mood is neutral and her affect is mood appropriate.  She denies any auditory or visual hallucination.  She denies any active or passive suicidal thoughts or homicidal thoughts.  There were no delusion or psychotic symptoms present at this time.  There were no flight of idea or loose association.  Her fund of knowledge is adequate.  There no tremors or shakes present.  Her attention and concentration is fair.  She's alert and oriented x3.  Her insight judgment and impulse control is okay.   Established Problem, Stable/Improving (1), Review of Psycho-Social Stressors (1), Review of Last Therapy Session (1) and Review of Medication Regimen & Side Effects (2)  Assessment: Axis I: Depressive disorder NOS  Axis II: Deferred  Axis III: See medical history  Axis IV: Moderate  Axis V: 60-65   Plan:  I will continue Remeron 30 mg and Wellbutrin XL 300 mg daily.  Risk and benefit explain in detail.  We will schedule appointment with a therapist in this office for coping and social skills.  Recommend to  keep appointment with her therapist.  Recommended to call us back if she has any question or any concern.  Followup in 3 months.  [name] Kronk T., MD 02/23/2014

## 2014-02-26 NOTE — Assessment & Plan Note (Signed)
Restarted, but has now quit x2 weeks

## 2014-03-08 ENCOUNTER — Ambulatory Visit (INDEPENDENT_AMBULATORY_CARE_PROVIDER_SITE_OTHER): Payer: Medicare Other | Admitting: Sports Medicine

## 2014-03-08 ENCOUNTER — Encounter: Payer: Self-pay | Admitting: Sports Medicine

## 2014-03-08 ENCOUNTER — Ambulatory Visit: Payer: Self-pay | Admitting: Sports Medicine

## 2014-03-08 VITALS — BP 145/98 | HR 85 | Temp 98.3°F | Wt 168.0 lb

## 2014-03-08 DIAGNOSIS — L723 Sebaceous cyst: Secondary | ICD-10-CM | POA: Diagnosis not present

## 2014-03-08 DIAGNOSIS — J449 Chronic obstructive pulmonary disease, unspecified: Secondary | ICD-10-CM | POA: Diagnosis not present

## 2014-03-08 DIAGNOSIS — E559 Vitamin D deficiency, unspecified: Secondary | ICD-10-CM

## 2014-03-08 DIAGNOSIS — I10 Essential (primary) hypertension: Secondary | ICD-10-CM

## 2014-03-08 DIAGNOSIS — L989 Disorder of the skin and subcutaneous tissue, unspecified: Secondary | ICD-10-CM | POA: Diagnosis not present

## 2014-03-08 DIAGNOSIS — L720 Epidermal cyst: Secondary | ICD-10-CM

## 2014-03-08 MED ORDER — GABAPENTIN 100 MG PO CAPS: 100.0000 mg | ORAL_CAPSULE | Freq: Three times a day (TID) | ORAL | Status: DC

## 2014-03-08 MED ORDER — CLONIDINE HCL 0.3 MG PO TABS: ORAL_TABLET | ORAL | Status: DC

## 2014-03-08 NOTE — Progress Notes (Deleted)
HPI  Physical Exam   

## 2014-03-08 NOTE — Patient Instructions (Signed)
Labs again today Please get a chest X-ray before you see me next  Keep the left shoulder covered with Non-stick pad (Telfa) and use vaseline.   For your skin I recommend using the following products to help decrease sensitivity that is contributing to your condition.  Many soaps and lotions are very irritating to your skin.  I recommend using only the following options.  SOAP:     Either original scent free DOVE (use brand only)    or Cetaphil (generic okay)   Lotions, Creams & Ointments:     Lotions: try to avoid in general if you are having irritation, they tend to dry out more   Creams: Eucerin Cream (generic okay)   Ointments: Vaseline Petroleum Jelly (generic okay)

## 2014-03-09 ENCOUNTER — Other Ambulatory Visit: Payer: Self-pay | Admitting: Sports Medicine

## 2014-03-09 LAB — ANTI-SMITH ANTIBODY: ENA SM Ab Ser-aCnc: <1

## 2014-03-09 LAB — RNP ANTIBODY: RIBONUCLEIC PROTEIN(ENA) ANTIBODY, IGG: NEGATIVE

## 2014-03-09 LAB — VITAMIN D 25 HYDROXY (VIT D DEFICIENCY, FRACTURES): Vit D, 25-Hydroxy: 21 ng/mL — ABNORMAL LOW (ref 30–89)

## 2014-03-09 LAB — ANGIOTENSIN CONVERTING ENZYME: ANGIOTENSIN-CONVERTING ENZYME: 13 U/L (ref 8–52)

## 2014-03-13 ENCOUNTER — Encounter: Payer: Self-pay | Admitting: Sports Medicine

## 2014-03-13 NOTE — Assessment & Plan Note (Signed)
Chronic condition.  Persistently intermittently elevated.  Patient is reporting significant anxiety around potentially having a procedure today. 1. Short-term followup if persistently elevated consider change in regimen > Followup at next visit and consider D/C of clonidine  .

## 2014-03-13 NOTE — Assessment & Plan Note (Signed)
Acute on chronic condition  - likely complicated by localized tape reaction 1. Defer excision, no indication for antibiotics, avoid tape > Reschedule for excision when patient is ready

## 2014-03-13 NOTE — Progress Notes (Signed)
[name] - 62 y.o. female MRN [id number]  Date of birth: [birth year]  CC, SUBJECTIVE & ROS:     If applicable, see problem based charting for additional problem specific documentation. HPI Comments: Patient presents with:   Shoulder Pain - left shoulder cyst, irritated and red, felt to be due to dressing   Skin Lesions - multiple   Patient presented today for planned excision of left shoulder cyst however reporting surrounding skin erythema and irritation associated with adhesive.  Patient is hesitant and does not want any biopsies or procedures today.  Pt denies any fevers, chills, or rigors.  She does report persistent left shoulder pain.  She reports being highly anxious especially now that she has quit smoking.  She has seen her psychiatrist who recommended against Chantix she is no longer taking this.  Pt denies chest pain, dyspnea at rest or exertion, PND, lower extremity edema. Patient denies any facial asymmetry, unilateral weakness, or dysarthria.   HISTORY: Past Medical, Surgical, Social, and Family History Reviewed & Updated per EMR.  Pertinent Historical Findings include: Smoker, recently quit with COPD; hypertension, vitamin D deficiency, multiple skin lesions, History of migraines, anxiety attack, depression       OBJECTIVE:  BP:145/98 mmHg  HR:85bpm  TEMP:98.3 F (36.8 C)(Oral)  RESP:   HT:    WT:168 lb (76.204 kg)  BMI:  Physical Exam  Vitals reviewed. Constitutional: She is well-developed, well-nourished, and in no distress. No distress.  HENT:  Head: Normocephalic and atraumatic.  Right Ear: External ear normal.  Left Ear: External ear normal.  Eyes: Right eye exhibits no discharge. Left eye exhibits no discharge. No scleral icterus.  Cardiovascular: Normal rate, regular rhythm and normal heart sounds.  Exam reveals no gallop and no friction rub.   No murmur heard. Pulmonary/Chest: Effort normal and breath sounds normal. No respiratory distress.  She has no wheezes. She has no rales.  Musculoskeletal: She exhibits no edema and no tenderness.  Neurological: She is alert.  Moves all 4 extremities spontaneously; no lateralization.  Skin: Skin is warm and dry. She is not diaphoretic.  Multiple non-erythematous skin lesions as below.  Left posterior deltoid region with  Superficial  Erythema and small vesicles consistent with local tape reaction.  Cyst is continuing to heal well without any signs of fluctuance or warmth.    Psychiatric: Mood, memory, affect and judgment normal.    MEDICATIONS, LABS & OTHER ORDERS: Previous Medications   ACYCLOVIR (ZOVIRAX) 200 MG CAPSULE    Take 1 capsule (200 mg total) by mouth 5 (five) times daily.   ALBUTEROL (PROAIR HFA) 108 (90 BASE) MCG/ACT INHALER    Inhale 2 puffs into the lungs as directed.   ATORVASTATIN (LIPITOR) 40 MG TABLET    TAKE 1 TABLET BY MOUTH ONCE DAILY   BUPROPION (WELLBUTRIN XL) 300 MG 24 HR TABLET    TAKE 1 TABLET BY MOUTH EVERY DAY   CLOTRIMAZOLE (LOTRIMIN) 1 % CREAM    Apply topically 2 (two) times daily.   DEXLANSOPRAZOLE (DEXILANT) 60 MG CAPSULE    Take 1 capsule (60 mg total) by mouth daily.   DOXYCYCLINE (VIBRA-TABS) 100 MG TABLET    Take 1 tablet (100 mg total) by mouth 2 (two) times daily.   FLUCONAZOLE (DIFLUCAN) 150 MG TABLET    Take 1 tablet (150 mg total) by mouth once.   FLUTICASONE (FLONASE) 50 MCG/ACT NASAL SPRAY    Place 2 sprays into the nose daily.   MIRTAZAPINE (REMERON) 30 MG  TABLET    TAKE 1 TABLET BY MOUTH EVERY NIGHT AT BEDTIME   TOPIRAMATE (TOPAMAX) 25 MG TABLET    Take 1 tablet (25 mg total) by mouth 2 (two) times daily.   Modified Medications   Modified Medication Previous Medication   CLONIDINE (CATAPRES) 0.3 MG TABLET cloNIDine (CATAPRES) 0.3 MG tablet      TAKE 1/2 TABLET IN THE MORNING AND 1& 1/2 TABLETS BY MOUTH AT BEDTIME    TAKE 1/2 TABLET IN THE MORNING AND 1& 1/2 TABLETS BY MOUTH AT BEDTIME   GABAPENTIN (NEURONTIN) 100 MG CAPSULE gabapentin  (NEURONTIN) 100 MG capsule      Take 1 capsule (100 mg total) by mouth 3 (three) times daily.    TAKE 1 CAPSULE BY MOUTH THREE TIMES DAILY AS NEEDED. TAKE AT LEAST ONCE PER NIGHT AS DIRECTED   LISINOPRIL-HYDROCHLOROTHIAZIDE (PRINZIDE,ZESTORETIC) 10-12.5 MG PER TABLET lisinopril-hydrochlorothiazide (PRINZIDE,ZESTORETIC) 10-12.5 MG per tablet      TAKE 1 TABLET BY MOUTH DAILY    Take 1 tablet by mouth daily.   POTASSIUM CHLORIDE SA (K-DUR,KLOR-CON) 20 MEQ TABLET potassium chloride SA (K-DUR,KLOR-CON) 20 MEQ tablet      TAKE 1 TABLET BY MOUTH DAILY    Take 1 tablet (20 mEq total) by mouth daily.   New Prescriptions   No medications on file   Discontinued Medications   VARENICLINE (CHANTIX PAK) 0.5 MG X 11 & 1 MG X 42 TABLET    Take one 0.5 mg tablet po qd 3 days, then increase to one 0.5 mg tablet bid for 4 days, then increase to one 1 mg tablet bid   Orders Placed This Encounter  Procedures  . DG Chest 2 View  . Vit D  25 hydroxy (rtn osteoporosis monitoring)  . Angiotensin converting enzyme  . Anti-Smith antibody  . RNP Antibody  . Histone antibodies, IgG, blood   ASSESSMENT & PLAN: See problem based charting & AVS for pt instructions.

## 2014-03-13 NOTE — Assessment & Plan Note (Addendum)
Chronic condition  - Has undergone extensive however incomplete autoimmune workup that has been unrevealing at this time.  Concern for sarcoidosis versus SLE versus other connective tissue disorder including drug related SLE like syndrome. - Patient does not want a biopsy today but understands the importance of this in the overall picture. 1. Further autoimmune labs including antihistamine, anti-RNP, anti-Smith.  Check ACE level for consideration of sarcoidosis.  Check vitamin D.  > Consider discontinuation of clonidine as this can be associated with drug-induced lupus although we have no serologic evidence this is the case.  > Reschedule time for biopsy when patient is willing > Consider referral to rheumatology versus dermatology

## 2014-03-13 NOTE — Assessment & Plan Note (Signed)
Chronic condition, previously treated.  Could be contributing factor to the skin lesions and overall clinical picture 1. Recheck vitamin D > Is markedly deficient need supplementation with vitamin D3  .

## 2014-03-14 ENCOUNTER — Ambulatory Visit: Payer: Self-pay | Admitting: Family Medicine

## 2014-03-15 MED ORDER — VITAMIN D3 1.25 MG (50000 UT) PO CAPS: 1.0000 | ORAL_CAPSULE | ORAL | Status: DC

## 2014-03-15 NOTE — Addendum Note (Signed)
Addended by: Teresa Coombs D on: 03/15/2014 09:22 AM   Modules accepted: Orders

## 2014-03-15 NOTE — Progress Notes (Signed)
Marked Vit D Deficiency - 12 weeks supplementation Rxd.  Unrevealing autoimmune labs.  Will need f/u for Biopsy/Cyst Excision.  Anti-Histone Pending Called and LVM for pt

## 2014-03-24 ENCOUNTER — Other Ambulatory Visit: Payer: Self-pay | Admitting: Sports Medicine

## 2014-03-27 ENCOUNTER — Other Ambulatory Visit (HOSPITAL_COMMUNITY): Payer: Self-pay | Admitting: Psychiatry

## 2014-04-05 ENCOUNTER — Other Ambulatory Visit (HOSPITAL_COMMUNITY): Payer: Self-pay | Admitting: Psychiatry

## 2014-04-08 ENCOUNTER — Other Ambulatory Visit: Payer: Self-pay | Admitting: Sports Medicine

## 2014-04-12 ENCOUNTER — Ambulatory Visit: Payer: Self-pay | Admitting: Sports Medicine

## 2014-04-18 ENCOUNTER — Ambulatory Visit: Payer: Self-pay | Admitting: Sports Medicine

## 2014-04-21 ENCOUNTER — Encounter: Payer: Self-pay | Admitting: Sports Medicine

## 2014-04-21 ENCOUNTER — Ambulatory Visit (INDEPENDENT_AMBULATORY_CARE_PROVIDER_SITE_OTHER): Payer: Medicare Other | Admitting: Sports Medicine

## 2014-04-21 VITALS — BP 143/85 | HR 77 | Temp 98.4°F | Ht 63.5 in | Wt 164.3 lb

## 2014-04-21 DIAGNOSIS — L989 Disorder of the skin and subcutaneous tissue, unspecified: Secondary | ICD-10-CM

## 2014-04-21 DIAGNOSIS — Z1211 Encounter for screening for malignant neoplasm of colon: Secondary | ICD-10-CM | POA: Diagnosis not present

## 2014-04-21 DIAGNOSIS — Z299 Encounter for prophylactic measures, unspecified: Secondary | ICD-10-CM | POA: Diagnosis not present

## 2014-04-21 DIAGNOSIS — J309 Allergic rhinitis, unspecified: Secondary | ICD-10-CM | POA: Diagnosis not present

## 2014-04-21 DIAGNOSIS — K219 Gastro-esophageal reflux disease without esophagitis: Secondary | ICD-10-CM

## 2014-04-21 DIAGNOSIS — F17200 Nicotine dependence, unspecified, uncomplicated: Secondary | ICD-10-CM | POA: Diagnosis not present

## 2014-04-21 DIAGNOSIS — R05 Cough: Secondary | ICD-10-CM

## 2014-04-21 DIAGNOSIS — E559 Vitamin D deficiency, unspecified: Secondary | ICD-10-CM

## 2014-04-21 DIAGNOSIS — J449 Chronic obstructive pulmonary disease, unspecified: Secondary | ICD-10-CM | POA: Diagnosis not present

## 2014-04-21 DIAGNOSIS — R059 Cough, unspecified: Secondary | ICD-10-CM

## 2014-04-21 MED ORDER — VITAMIN D3 1.25 MG (50000 UT) PO CAPS: 1.0000 | ORAL_CAPSULE | ORAL | Status: DC

## 2014-04-21 NOTE — Patient Instructions (Addendum)
Dermatology Referal Gastroenterology Referral  Keep taking your Flonase  GO get your chest X-ray

## 2014-04-21 NOTE — Progress Notes (Signed)
  [name] - 62 y.o. female MRN [id number]  Date of birth: 1951-10-08  SUBJECTIVE:  Including CC & ROS.   Cough: mainly at night, worse X past 3 weeks.  Patient reports overall good compliance.  No wheezing.  No significant shortness of breath but worsening nonproductive cough.  Skin issues: Continues to have nodules showing up on legs and arms.  Not interested in biopsy again today but interested in talking with dermatology.  Preventive measure: Agreeable to referral for colonoscopy.  Agreeable to mammogram   HISTORY: Past Medical, Surgical, Social, and Family History Reviewed & Updated per EMR. Pertinent Historical Findings include: Molley's major active medical problems include: # CVD: HTN, HLD, no prior CAD/CVA  # COPD: stable, GOLD  # Depression: Followed by Dr. Rhea Belton  # Metabolic: Impaired fasting glucose, diet controlled  Other Pertinent Med/Surg/Hosp History: #    Follow up Issues:    DATA REVIEWED: CT Abd 13 - pulmonary nodule noted without followup recommended  PHYSICAL EXAM:  VS: BP:143/85 mmHg  HR:77bpm  TEMP:98.4 F (36.9 C)(Oral)  RESP:   HT:5' 3.5" (161.3 cm)   WT:164 lb 4.8 oz (74.526 kg)  BMI:28.7 PHYSICAL EXAM: GENERAL:  Adult obese African American female. In no discomfort; no respiratory distress  PSYCH:  alert and appropriate, good insight  HNEENT:  mmm, no JVD nasal mucosa erythematous and boggy.,  CARDIAC:  RRR, S1/S2 heard, no murmur LUNGS:  CTA B, no wheezes, no crackles ABDOMEN:   EXTREM:  Warm, well perfused.  Moves all 4 extremities spontaneously; no lateralization.  Pedal pulses 1/4.  no pretibial edema. SKIN:  Multiple hyperpigmented nummular lesions on arms and legs as documented previously and as reflected and photos below (taken previously but consistent with today's exam).  Small picking lesions      ASSESSMENT & PLAN: See problem based charting & AVS for pt instructions.

## 2014-04-23 ENCOUNTER — Ambulatory Visit (HOSPITAL_COMMUNITY)
Admission: EM | Admit: 2014-04-23 | Discharge: 2014-04-23 | Disposition: A | Discharge status: Home / Self Care | Payer: Medicare Other | Source: Other Acute Inpatient Hospital | Attending: Sports Medicine | Admitting: Sports Medicine

## 2014-04-23 ENCOUNTER — Ambulatory Visit (HOSPITAL_COMMUNITY)
Admission: RE | Admit: 2014-04-23 | Discharge: 2014-04-23 | Disposition: A | Discharge status: Home / Self Care | Payer: Medicare Other | Source: Ambulatory Visit | Attending: Family Medicine | Admitting: Family Medicine

## 2014-04-23 DIAGNOSIS — R059 Cough, unspecified: Secondary | ICD-10-CM | POA: Diagnosis not present

## 2014-04-23 DIAGNOSIS — R05 Cough: Secondary | ICD-10-CM

## 2014-04-25 DIAGNOSIS — J309 Allergic rhinitis, unspecified: Secondary | ICD-10-CM | POA: Insufficient documentation

## 2014-04-25 DIAGNOSIS — Z299 Encounter for prophylactic measures, unspecified: Secondary | ICD-10-CM | POA: Insufficient documentation

## 2014-04-25 NOTE — Assessment & Plan Note (Signed)
-   Continue Flonase  °

## 2014-04-25 NOTE — Assessment & Plan Note (Signed)
Gastroenterology referral placed for colonoscopy. Information given to call for mammogram.

## 2014-04-25 NOTE — Assessment & Plan Note (Signed)
Cough very well could be coming from this however given allergic findings treat that as well.  No wheezing today.  No hypoxia.  No changes to regimen.

## 2014-04-25 NOTE — Assessment & Plan Note (Signed)
Patient does continue to smoke.  Encouraged to quit once again.

## 2014-04-25 NOTE — Assessment & Plan Note (Signed)
Recheck labs today.  Treat if low

## 2014-04-25 NOTE — Assessment & Plan Note (Signed)
Chronic condition  - previously declined biopsy on day of procedure.  Agreeable to seeing dermatology.  Concern for systemic infiltrative disease such as sarcoidosis or SLE but no tissue diagnosis has been obtained. 1. Referral to dermatology

## 2014-05-01 ENCOUNTER — Ambulatory Visit (INDEPENDENT_AMBULATORY_CARE_PROVIDER_SITE_OTHER): Payer: Medicare Other | Admitting: Family Medicine

## 2014-05-01 ENCOUNTER — Telehealth: Payer: Self-pay | Admitting: *Deleted

## 2014-05-01 ENCOUNTER — Encounter: Payer: Self-pay | Admitting: Family Medicine

## 2014-05-01 VITALS — BP 107/71 | HR 69 | Temp 98.2°F | Ht 63.5 in | Wt 166.0 lb

## 2014-05-01 DIAGNOSIS — J3089 Other allergic rhinitis: Secondary | ICD-10-CM

## 2014-05-01 DIAGNOSIS — L989 Disorder of the skin and subcutaneous tissue, unspecified: Secondary | ICD-10-CM | POA: Diagnosis not present

## 2014-05-01 DIAGNOSIS — J449 Chronic obstructive pulmonary disease, unspecified: Secondary | ICD-10-CM | POA: Diagnosis not present

## 2014-05-01 DIAGNOSIS — I10 Essential (primary) hypertension: Secondary | ICD-10-CM

## 2014-05-01 DIAGNOSIS — J302 Other seasonal allergic rhinitis: Secondary | ICD-10-CM

## 2014-05-01 DIAGNOSIS — J069 Acute upper respiratory infection, unspecified: Secondary | ICD-10-CM | POA: Diagnosis not present

## 2014-05-01 MED ORDER — FLUTICASONE PROPIONATE 50 MCG/ACT NA SUSP: 2.0000 | Freq: Every day | NASAL | Status: DC

## 2014-05-01 MED ORDER — CLONIDINE HCL 0.3 MG PO TABS: ORAL_TABLET | ORAL | Status: DC

## 2014-05-01 MED ORDER — NYSTATIN 100000 UNIT/GM EX POWD: Freq: Three times a day (TID) | CUTANEOUS | Status: DC

## 2014-05-01 MED ORDER — BUDESONIDE-FORMOTEROL FUMARATE 80-4.5 MCG/ACT IN AERO: 2.0000 | INHALATION_SPRAY | Freq: Two times a day (BID) | RESPIRATORY_TRACT | Status: DC

## 2014-05-01 MED ORDER — ALBUTEROL SULFATE HFA 108 (90 BASE) MCG/ACT IN AERS: 2.0000 | INHALATION_SPRAY | RESPIRATORY_TRACT | Status: DC

## 2014-05-01 MED ORDER — CETIRIZINE HCL 10 MG PO TABS: 10.0000 mg | ORAL_TABLET | Freq: Every day | ORAL | Status: DC

## 2014-05-01 NOTE — Assessment & Plan Note (Signed)
BP stable. -Refilled Clonidine 0.3mg   -Will continue to monitor

## 2014-05-01 NOTE — Patient Instructions (Signed)
[name], it was a pleasure meeting you today!  I am sending your allergy pill (Zyrtec/Cetirizine 10mg ) to [facility] along with your inhalers, powder and nasal spray.  If your cough gets worse, you experience unexplained weight loss, difficulty breathing or chest pain please call the office or go the the emergency room.       Cough, Adult  A cough is a reflex that helps clear your throat and airways. It can help heal the body or may be a reaction to an irritated airway. A cough may only last 2 or 3 weeks (acute) or may last more than 8 weeks (chronic).  CAUSES Acute cough:  Viral or bacterial infections. Chronic cough:  Infections.  Allergies.  Asthma.  Post-nasal drip.  Smoking.  Heartburn or acid reflux.  Some medicines.  Chronic lung problems (COPD).  Cancer. SYMPTOMS   Cough.  Fever.  Chest pain.  Increased breathing rate.  High-pitched whistling sound when breathing (wheezing).  Colored mucus that you cough up (sputum). TREATMENT   A bacterial cough may be treated with antibiotic medicine.  A viral cough must run its course and will not respond to antibiotics.  Your caregiver may recommend other treatments if you have a chronic cough. HOME CARE INSTRUCTIONS   Only take over-the-counter or prescription medicines for pain, discomfort, or fever as directed by your caregiver. Use cough suppressants only as directed by your caregiver.  Use a cold steam vaporizer or humidifier in your bedroom or home to help loosen secretions.  Sleep in a semi-upright position if your cough is worse at night.  Rest as needed.  Stop smoking if you smoke. SEEK IMMEDIATE MEDICAL CARE IF:   You have pus in your sputum.  Your cough starts to worsen.  You cannot control your cough with suppressants and are losing sleep.  You begin coughing up blood.  You have difficulty breathing.  You develop pain which is getting worse or is uncontrolled with medicine.  You  have a fever. MAKE SURE YOU:   Understand these instructions.  Will watch your condition.  Will get help right away if you are not doing well or get worse. Document Released: 04/11/2011 Document Revised: 01/05/2012 Document Reviewed: 04/11/2011 [facility] Patient Information 2015 [place], [place]. This information is not intended to replace advice given to you by your health care provider. Make sure you discuss any questions you have with your health care provider. Allergic Rhinitis Allergic rhinitis is when the mucous membranes in the nose respond to allergens. Allergens are particles in the air that cause your body to have an allergic reaction. This causes you to release allergic antibodies. Through a chain of events, these eventually cause you to release histamine into the blood stream. Although meant to protect the body, it is this release of histamine that causes your discomfort, such as frequent sneezing, congestion, and an itchy, runny nose.  CAUSES  Seasonal allergic rhinitis (hay fever) is caused by pollen allergens that may come from grasses, trees, and weeds. Year-round allergic rhinitis (perennial allergic rhinitis) is caused by allergens such as house dust mites, pet dander, and mold spores.  SYMPTOMS   Nasal stuffiness (congestion).  Itchy, runny nose with sneezing and tearing of the eyes. DIAGNOSIS  Your health care provider can help you determine the allergen or allergens that trigger your symptoms. If you and your health care provider are unable to determine the allergen, skin or blood testing may be used. TREATMENT  Allergic rhinitis does not have a cure,  but it can be controlled by:  Medicines and allergy shots (immunotherapy).  Avoiding the allergen. Hay fever may often be treated with antihistamines in pill or nasal spray forms. Antihistamines block the effects of histamine. There are over-the-counter medicines that may help with nasal congestion and swelling around the  eyes. Check with your health care provider before taking or giving this medicine.  If avoiding the allergen or the medicine prescribed do not work, there are many new medicines your health care provider can prescribe. Stronger medicine may be used if initial measures are ineffective. Desensitizing injections can be used if medicine and avoidance does not work. Desensitization is when a patient is given ongoing shots until the body becomes less sensitive to the allergen. Make sure you follow up with your health care provider if problems continue. HOME CARE INSTRUCTIONS It is not possible to completely avoid allergens, but you can reduce your symptoms by taking steps to limit your exposure to them. It helps to know exactly what you are allergic to so that you can avoid your specific triggers. SEEK MEDICAL CARE IF:   You have a fever.  You develop a cough that does not stop easily (persistent).  You have shortness of breath.  You start wheezing.  Symptoms interfere with normal daily activities. Document Released: 07/08/2001 Document Revised: 10/18/2013 Document Reviewed: 06/20/2013 [facility] Patient Information 2015 [place], [place]. This information is not intended to replace advice given to you by your health care provider. Make sure you discuss any questions you have with your health care provider.

## 2014-05-01 NOTE — Progress Notes (Signed)
    Subjective: HPI: Patient is a 63 y.o. female presenting to clinic today for cough. Concerns today include:  1. Cough:  H/O a 5 week cough for which she was previously seen on 6/30.  The cough has not resolved and continues to bother her most at night time.  She admits to post nasal drip and a 2 day tactile fever that resolved over this weekend 7/3-7/4.  Admits to Left side of head feeling fuller compared to Right side.  Currently, she has resumed her Flonase, Symbicort and ProAir for cough but has not had any relief of symptoms.  She has not tried over the counter allergy pills.  Denies unexplained weight loss, chest pain or difficulty breathing.    2. Skin rash:  Patient states that she gets a rash under her belly folds every summer.  She was given Nystatin cream by Dr Paulla Fore at last visit but explains that she prefers the powder form of this medication.  Currently, the medication is working and she is not experiencing any active rash at the moment.    3. Hypertension: Patient would like a refill on her Clonidine 0.3mg .  She is compliant with medications. She denies any concerns.   Health Maintenance: f/u with Dermatologist in [facility] for L shoulder cyst and other skin lesions.  ROS: Please see HPI above.  Objective: Office vital signs reviewed. BP 107/71  Pulse 69  Temp(Src) 98.2 F (36.8 C) (Oral)  Ht 5' 3.5" (1.613 m)  Wt 166 lb (75.297 kg)  BMI 28.94 kg/m2  SpO2 100%  PF 240 L/min  Physical Examination:  General: Awake, alert. NAD HEENT: Atraumatic, normocephalic,inner and outer ear without lesions,     EARS:TM good light reflex, no erythema    NOSE: nasal turbinates edematous and erythematous, +clear drainage    THROAT: MMM, mild erythema of the oropharynx  Pulm: CTAB, no wheezes, rhonchi or rales, good air movement Skin: in tact, no erythema or edema of the abdominal folds.  Neuro: Strength grossly intact  Assessment: 63 y.o. female with: 1. Allergic  rhinitis 2. Intertrigo of abdominal folds 3. Hypertension  Plan: See Problem List and After Visit Summary  Suzana Sohail M. Lajuana Ripple, DO

## 2014-05-01 NOTE — Assessment & Plan Note (Addendum)
COPD:  -stable.   -Proair and Symbicort have been refilled for patient -Patient sent home with information on cough.

## 2014-05-01 NOTE — Telephone Encounter (Signed)
Marines please advise.Thank you.[name], [name]

## 2014-05-01 NOTE — Assessment & Plan Note (Addendum)
-  Based on HPI and exam, symptoms are congruent with cough secondary to post nasal drip not controlled by Flonase.  DDx include COPD, uncontrolled GERD. -Patient given refill on Flonase and ProAir HFA -Patient started on Zyrtec 10mg  POQD and informed of potential for sleepiness. Consider changing to Claritin if drowsy. -Patient given handout on cough and allergies. -Patient instructed to call if no improvement or if symptoms worsen.

## 2014-05-01 NOTE — Telephone Encounter (Signed)
Message copied by Corinna Capra on Mon May 01, 2014  4:33 PM ------      Message from: Janora Norlander      Created: Mon May 01, 2014  3:02 PM       Please check on referral to dermatologist in [facility] done by Dr Paulla Fore.  Patient states she has not received any correspondence about that appointment.  Thank you so much!  ------

## 2014-05-02 NOTE — Telephone Encounter (Signed)
[name]   Pt needs to call to [facility] [phone] to Sch an appt. OV notes and referral was faxed to them.  Thank You   Marines

## 2014-05-02 NOTE — Telephone Encounter (Signed)
Relayed message,patient voiced great appreciation.Reaghan Kawa, Lewie Loron

## 2014-05-03 ENCOUNTER — Encounter: Payer: Self-pay | Admitting: Internal Medicine

## 2014-05-10 DIAGNOSIS — L259 Unspecified contact dermatitis, unspecified cause: Secondary | ICD-10-CM | POA: Diagnosis not present

## 2014-05-15 ENCOUNTER — Ambulatory Visit: Payer: Self-pay | Admitting: Family Medicine

## 2014-05-24 DIAGNOSIS — H2510 Age-related nuclear cataract, unspecified eye: Secondary | ICD-10-CM | POA: Diagnosis not present

## 2014-05-28 ENCOUNTER — Other Ambulatory Visit (HOSPITAL_COMMUNITY): Payer: Self-pay | Admitting: Psychiatry

## 2014-05-30 ENCOUNTER — Ambulatory Visit (INDEPENDENT_AMBULATORY_CARE_PROVIDER_SITE_OTHER): Payer: Medicare Other | Admitting: Psychiatry

## 2014-05-30 ENCOUNTER — Encounter (HOSPITAL_COMMUNITY): Payer: Self-pay | Admitting: Psychiatry

## 2014-05-30 VITALS — BP 165/85 | HR 66 | Ht 63.5 in | Wt 169.8 lb

## 2014-05-30 DIAGNOSIS — F3289 Other specified depressive episodes: Secondary | ICD-10-CM | POA: Diagnosis not present

## 2014-05-30 DIAGNOSIS — F329 Major depressive disorder, single episode, unspecified: Secondary | ICD-10-CM

## 2014-05-30 MED ORDER — MIRTAZAPINE 30 MG PO TABS: 30.0000 mg | ORAL_TABLET | Freq: Every day | ORAL | Status: DC

## 2014-05-30 MED ORDER — BUPROPION HCL ER (XL) 300 MG PO TB24: 300.0000 mg | ORAL_TABLET | Freq: Every day | ORAL | Status: DC

## 2014-05-30 NOTE — Progress Notes (Signed)
[name] Progress Note  [name] [id number] 63 y.o.  05/30/2014 3:06 PM  Chief Complaint:  Medication management and followup.      History of Present Illness:   [name] came for her followup appointment.  Recently she had a trip to [place] and she had a good time.  She notice no coughing when she was at the beach however when she returned she starts coughing.  She now believes that there is something wrong way she is living.  She wanted to change her place .  Overall her mood has been stable.  She denies any irritability, anger, mood swings.  She is taking Remeron and Wellbutrin.  She denied any side effects of medication.  She denies any crying spells.  Recently she see her primary care physician and there has been no changes.  Her appetite is okay.  Her vitals are stable.  Patient does not drink alcohol or use any illegal substances.  She has some family issues and now she is thinking to start counseling and this office.  Suicidal Ideation: No Plan Formed: No Patient has means to carry out plan: No  Homicidal Ideation: No Plan Formed: No Patient has means to carry out plan: No  Review of Systems: Psychiatric: Agitation: No Hallucination: No Depressed Mood: No Insomnia: No Hypersomnia: No Altered Concentration: No Feels Worthless: No Grandiose Ideas: No Belief In Special Powers: No New/Increased Substance Abuse: No Compulsions: No  Neurologic: Headache: Yes Seizure: No Paresthesias: No  Medical History;  She has history of hypertension, migraine headache, asthma, GERD, chronic pain.  She is seeing a physician at [facility]  internal medicine.    Outpatient Encounter Prescriptions as of 05/30/2014  Medication Sig  . [DISCONTINUED] buPROPion (WELLBUTRIN XL) 300 MG 24 hr tablet TAKE 1 TABLET BY MOUTH EVERY DAY  . [DISCONTINUED] mirtazapine (REMERON) 30 MG tablet TAKE 1 TABLET BY MOUTH EVERY NIGHT AT BEDTIME  . acyclovir (ZOVIRAX) 200 MG capsule Take 1  capsule (200 mg total) by mouth 5 (five) times daily.  [name] albuterol (PROAIR HFA) 108 (90 BASE) MCG/ACT inhaler Inhale 2 puffs into the lungs as directed.  [name] atorvastatin (LIPITOR) 40 MG tablet TAKE 1 TABLET BY MOUTH ONCE DAILY  . budesonide-formoterol (SYMBICORT) 80-4.5 MCG/ACT inhaler Inhale 2 puffs into the lungs 2 (two) times daily.  [name] buPROPion (WELLBUTRIN XL) 300 MG 24 hr tablet Take 1 tablet (300 mg total) by mouth daily.  . cetirizine (ZYRTEC) 10 MG tablet Take 1 tablet (10 mg total) by mouth daily.  . Cholecalciferol (VITAMIN D3) 50000 UNITS CAPS Take 1 capsule by mouth once a week.  . cloNIDine (CATAPRES) 0.3 MG tablet TAKE 1 1/2 TABLETS BY MOUTH AT BEDTIME  . dexlansoprazole (DEXILANT) 60 MG capsule Take 1 capsule (60 mg total) by mouth daily.  . fluticasone (FLONASE) 50 MCG/ACT nasal spray Place 2 sprays into both nostrils daily.  [name] gabapentin (NEURONTIN) 100 MG capsule Take 1 capsule (100 mg total) by mouth 3 (three) times daily.  [name] lisinopril-hydrochlorothiazide (PRINZIDE,ZESTORETIC) 10-12.5 MG per tablet TAKE 1 TABLET BY MOUTH DAILY  . mirtazapine (REMERON) 30 MG tablet Take 1 tablet (30 mg total) by mouth at bedtime.  [name] nystatin (MYCOSTATIN) powder Apply topically 3 (three) times daily.  . potassium chloride SA (K-DUR,KLOR-CON) 20 MEQ tablet TAKE 1 TABLET BY MOUTH DAILY  . topiramate (TOPAMAX) 25 MG tablet Take 1 tablet (25 mg total) by mouth 2 (two) times daily.    No results found for this or  any previous visit (from the past 72 hour(s)).  Past Psychiatric History/Hospitalization(s): She has history of psychiatric inpatient treatment at [facility].  She was admitted to get detox from her Xanax.  Patient also seeing psychiatrist when she was in [employer] in [place] in [place] and than [facility].  In the past she had tried Paxil Prozac Zoloft.  Patient denies any history of suicidal attempt or any psychosis. Anxiety: Yes Bipolar Disorder:  No Depression: Yes Mania: No Psychosis: No Schizophrenia: No Personality Disorder: No Hospitalization for psychiatric illness: Yes History of Electroconvulsive Shock Therapy: No Prior Suicide Attempts: No  ROSConstitutional:  BP 165/85  Pulse 66  Ht 5' 3.5" (1.613 m)  Wt 169 lb 12.8 oz (77.021 kg)  BMI 29.60 kg/m2  Musculoskeletal: Strength & Muscle Tone: within normal limits Gait & Station: normal  Mental Status Examination;  Patient is casually dressed and fairly groomed.  She is cooperative and maintained good eye contact.  Her speech is clear and coherent.  Her thought processes logical linear and goal-directed.  She described her mood is euthymic  and her affect is mood appropriate.  She denies any auditory or visual hallucination.  She denies any active or passive suicidal thoughts or homicidal thoughts.  There were no delusion or psychotic symptoms present at this time.  There were no flight of idea or loose association.  Her fund of knowledge is adequate.  There no tremors or shakes present.  Her attention and concentration is fair.  She's alert and oriented x3.  Her insight judgment and impulse control is okay.   Established Problem, Stable/Improving (1), Review of Psycho-Social Stressors (1), Review of Last Therapy Session (1) and Review of Medication Regimen & Side Effects (2)  Assessment: Axis I: Depressive disorder NOS  Axis II: Deferred  Axis III: See medical history  Axis IV: Moderate  Axis V: 60-65   Plan:  I will continue Remeron 30 mg and Wellbutrin XL 300 mg daily.  Patient like to see counselor for since she has family issues .  We will schedule appointment with therapist in this office.  Recommend to keep appointment with her therapist.  Recommended to call us back if she has any question or any concern.  Followup in 3 months.  [name] T., MD 05/30/2014

## 2014-06-20 ENCOUNTER — Ambulatory Visit (HOSPITAL_COMMUNITY): Payer: Self-pay | Admitting: Licensed Clinical Social Worker

## 2014-06-22 DIAGNOSIS — H1030 Unspecified acute conjunctivitis, unspecified eye: Secondary | ICD-10-CM | POA: Diagnosis not present

## 2014-06-22 DIAGNOSIS — H1045 Other chronic allergic conjunctivitis: Secondary | ICD-10-CM | POA: Diagnosis not present

## 2014-06-28 DIAGNOSIS — D235 Other benign neoplasm of skin of trunk: Secondary | ICD-10-CM | POA: Diagnosis not present

## 2014-07-06 ENCOUNTER — Other Ambulatory Visit: Payer: Self-pay | Admitting: Sports Medicine

## 2014-07-07 ENCOUNTER — Encounter: Payer: Self-pay | Admitting: Internal Medicine

## 2014-07-07 ENCOUNTER — Ambulatory Visit (INDEPENDENT_AMBULATORY_CARE_PROVIDER_SITE_OTHER): Payer: Medicare Other | Admitting: Internal Medicine

## 2014-07-07 VITALS — BP 160/98 | HR 76 | Ht 62.0 in | Wt 169.5 lb

## 2014-07-07 DIAGNOSIS — K219 Gastro-esophageal reflux disease without esophagitis: Secondary | ICD-10-CM

## 2014-07-07 DIAGNOSIS — R109 Unspecified abdominal pain: Secondary | ICD-10-CM

## 2014-07-07 DIAGNOSIS — K5900 Constipation, unspecified: Secondary | ICD-10-CM

## 2014-07-07 DIAGNOSIS — Z86010 Personal history of colonic polyps: Secondary | ICD-10-CM | POA: Diagnosis not present

## 2014-07-07 DIAGNOSIS — R197 Diarrhea, unspecified: Secondary | ICD-10-CM | POA: Diagnosis not present

## 2014-07-07 MED ORDER — MOVIPREP 100 G PO SOLR: 1.0000 | Freq: Once | ORAL | Status: DC

## 2014-07-07 MED ORDER — LINACLOTIDE 145 MCG PO CAPS: 145.0000 ug | ORAL_CAPSULE | Freq: Every day | ORAL | Status: DC

## 2014-07-07 MED ORDER — RANITIDINE HCL 150 MG PO TABS: 150.0000 mg | ORAL_TABLET | Freq: Two times a day (BID) | ORAL | Status: DC

## 2014-07-07 NOTE — Patient Instructions (Addendum)
You have been given a separate informational sheet regarding your tobacco use, the importance of quitting and local resources to help you quit.  We have sent the following medications to your pharmacy for you to pick up at your convenience: Linzess, Zantac.  Continue taking your Dexilant and take the Zantac in the evening.  Continue your gerd diet   You have been scheduled for an endoscopy and colonoscopy. Please follow the written instructions given to you at your visit today. Please pick up your prep at the pharmacy within the next 1-3 days. If you use inhalers (even only as needed), please bring them with you on the day of your procedure. Your physician has requested that you go to www.startemmi.com and enter the access code given to you at your visit today. This web site gives a general overview about your procedure. However, you should still follow specific instructions given to you by our office regarding your preparation for the procedure.

## 2014-07-07 NOTE — Progress Notes (Signed)
Patient ID: [name], female   DOB: 26-Apr-1951, 63 y.o.   MRN: [id number] HPI: [name] is a 63 yo female with PMH adenomatous colon polyp, GERD with history of reflux esophagitis, hypertension, hyperlipidemia, mild COPD who is seen in consultation at the request of Dr.Gottschalk to evaluate episodic abdominal pain with diarrhea. She is here alone today. She reports years of "trouble with my stomach". This is episodic mid and lower abdominal pain associated with severe explosive diarrhea. This happens approximately one day per week and is also associated with lower abdominal and lower back pain. The diarrhea is nonbloody and without melena. It may be triggered by greasy foods. It lasts for hours but not days. She estimates she remains on the toilet for hours throughout the day and also has associated nausea and diaphoresis. She does not often vomit. On the intervening days she does not have abdominal pain nor diarrhea. On those days she has approximately 2-3 more formed stools daily. Again stools have been nonbloody and non-melenic. She reports heartburn despite Dexilant 60 mg daily but no dysphagia or diet dysphagia. Appetite fluctuates as does weight. She reports frequent abdominal bloating and distention which has been present for years. She reports a history of a ruptured ulcer requiring exploratory laparotomy, years ago. She denies the use of frequent NSAIDs. On one such attack of abdominal pain she went to the ER and had a CT scan and reports being told "you're constipated".  Last colonoscopy was in 2007 where a large tubular adenoma with villous feature was removed piecemeal from the ascending colon. She reports family history of colon polyps in her mother and maternal aunt  Past Medical History  Diagnosis Date  . Hx of migraines   . Depression   . Hyperlipidemia   . Hypertension   . Urinary incontinence   . Back pain with radiation     radiopathy   . Nicotine addiction   .  Asthma   . Carpal tunnel syndrome 10/27/2010  . PANIC ATTACK 11/05/2007  . PELVIC  PAIN 08/13/2010  . Corneal abrasion, left 04/28/2013  . Colon polyps 2005  . COPD (chronic obstructive pulmonary disease)     Past Surgical History  Procedure Laterality Date  . Cholecystectomy  1999  . Total abdominal hysterectomy w/ bilateral salpingoophorectomy  1989  . Hiatal hernia repair  1967  . Removal of mild ducts from left breast  1982  . Bleeding ulcer repair  1992    Open Ex Lap with perforation of duodenal ulcer with pertitonitis  . Left ankle surgery  2008  . Double mastectomy  June 04, 2010  . Tubal ligation  1975, 1978    x 2    Outpatient Prescriptions Prior to Visit  Medication Sig Dispense Refill  . albuterol (PROAIR HFA) 108 (90 BASE) MCG/ACT inhaler Inhale 2 puffs into the lungs as directed.  1 Inhaler  3  . atorvastatin (LIPITOR) 40 MG tablet TAKE 1 TABLET BY MOUTH ONCE DAILY  30 tablet  11  . budesonide-formoterol (SYMBICORT) 80-4.5 MCG/ACT inhaler Inhale 2 puffs into the lungs 2 (two) times daily.  1 Inhaler  3  . buPROPion (WELLBUTRIN XL) 300 MG 24 hr tablet Take 1 tablet (300 mg total) by mouth daily.  90 tablet  0  . cetirizine (ZYRTEC) 10 MG tablet Take 1 tablet (10 mg total) by mouth daily.  30 tablet  11  . Cholecalciferol (VITAMIN D3) 50000 UNITS CAPS Take 1 capsule by mouth once a week.  12 capsule  0  . cloNIDine (CATAPRES) 0.3 MG tablet TAKE 1 1/2 TABLETS BY MOUTH AT BEDTIME  45 tablet  2  . dexlansoprazole (DEXILANT) 60 MG capsule Take 1 capsule (60 mg total) by mouth daily.  30 capsule  5  . fluticasone (FLONASE) 50 MCG/ACT nasal spray Place 2 sprays into both nostrils daily.  16 g  6  . gabapentin (NEURONTIN) 100 MG capsule Take 1 capsule (100 mg total) by mouth 3 (three) times daily.  90 capsule  1  . lisinopril-hydrochlorothiazide (PRINZIDE,ZESTORETIC) 10-12.5 MG per tablet TAKE 1 TABLET BY MOUTH DAILY  90 tablet  2  . mirtazapine (REMERON) 30 MG tablet Take 1  tablet (30 mg total) by mouth at bedtime.  90 tablet  0  . nystatin (MYCOSTATIN) powder Apply topically 3 (three) times daily.  15 g  1  . potassium chloride SA (K-DUR,KLOR-CON) 20 MEQ tablet TAKE 1 TABLET BY MOUTH DAILY  30 tablet  0  . topiramate (TOPAMAX) 25 MG tablet Take 1 tablet (25 mg total) by mouth 2 (two) times daily.  60 tablet  5  . acyclovir (ZOVIRAX) 200 MG capsule Take 1 capsule (200 mg total) by mouth 5 (five) times daily.  50 capsule  0   No facility-administered medications prior to visit.    Allergies  Allergen Reactions  . Aspirin   . Penicillins     Tablets only    Family History  Problem Relation Age of Onset  . Heart disease Mother   . Diabetes Mother   . Lupus Mother   . Tuberculosis Father   . Diabetes Brother   . Depression Sister   . Colon polyps Mother   . Colon polyps Maternal Aunt   . Heart disease Maternal Grandmother   . Heart attack Maternal Aunt   . Stomach cancer Sister     History  Substance Use Topics  . Smoking status: Current Every Day Smoker -- 0.25 packs/day for 42 years    Types: Cigarettes  . Smokeless tobacco: Never Used  . Alcohol Use: No    ROS: As per history of present illness, otherwise negative  BP 160/98  Pulse 76  Ht 5\' 2"  (1.575 m)  Wt 169 lb 8 oz (76.885 kg)  BMI 30.99 kg/m2 Constitutional: Well-developed and well-nourished. No distress. HEENT: Normocephalic and atraumatic. Oropharynx is clear and moist. No oropharyngeal exudate. Conjunctivae are normal.  No scleral icterus. Neck: Neck supple. Trachea midline. Cardiovascular: Normal rate, regular rhythm and intact distal pulses.  Pulmonary/chest: Effort normal and breath sounds normal. No wheezing, rales or rhonchi. Abdominal: Soft, protuberant and mildly distended with long vertical well-healed laparotomy scar, not particularly tender today. Bowel sounds active throughout.  Extremities: no clubbing, cyanosis, or edema Neurological: Alert and oriented to  person place and time. Skin: Skin is warm and dry. No rashes noted. Psychiatric: Normal mood and affect. Behavior is normal.  RELEVANT LABS AND IMAGING: CBC    Component Value Date/Time   WBC 7.0 12/08/2013 1212   RBC 4.46 12/08/2013 1212   HGB 12.7 12/08/2013 1212   HCT 38.2 12/08/2013 1212   PLT 371 12/08/2013 1212   MCV 85.7 12/08/2013 1212   MCH 28.5 12/08/2013 1212   MCHC 33.2 12/08/2013 1212   RDW 13.6 12/08/2013 1212   LYMPHSABS 4.3* 07/07/2013 1336   MONOABS 0.5 07/07/2013 1336   EOSABS 0.3 07/07/2013 1336   BASOSABS 0.0 07/07/2013 1336    CMP     Component Value Date/Time  NA 144 12/08/2013 1212   K 3.7 12/08/2013 1212   CL 109 12/08/2013 1212   CO2 24 12/08/2013 1212   GLUCOSE 118* 12/08/2013 1212   BUN 10 12/08/2013 1212   CREATININE 1.07 12/08/2013 1212   CREATININE 1.30* 02/01/2012 0411   CALCIUM 9.5 12/08/2013 1212   PROT 7.0 12/08/2013 1212   ALBUMIN 4.0 12/08/2013 1212   AST 29 12/08/2013 1212   ALT 23 12/08/2013 1212   ALKPHOS 107 12/08/2013 1212   BILITOT 0.7 12/08/2013 1212   GFRNONAA 40* 02/01/2012 0405   GFRNONAA 53* 01/20/2012 1400   GFRAA 46* 02/01/2012 0405   GFRAA 61 01/20/2012 1400   Clinical Data: Abdominal pain, nausea and vomiting.   CT ABDOMEN AND PELVIS WITH CONTRAST -- 02/06/2014   Technique:  Multidetector CT imaging of the abdomen and pelvis was performed following the standard protocol during bolus administration of intravenous contrast.   Contrast: 177mL OMNIPAQUE IOHEXOL 300 MG/ML  SOLN   Comparison: 08/23/2010   Findings: There is a large amount of fecal material throughout the colon with relative sparing of the distal sigmoid and rectum.  Some fecalization of contents also present at the level of the distal ileum.  Other small bowel loops are of normal caliber.  No obvious obstructing colonic lesion is identified by CT.  No evidence of bowel perforation, free fluid, abscess, hernia or enlarged lymph nodes.   Stable 4 mm noncalcified lung nodule at  the posterior left lung base.  The liver, pancreas, spleen and adrenal glands are within normal limits.  The gallbladder has been removed.  There is no evidence of biliary dilatation.  Stable cortical scarring of bilateral kidneys with a lobulated contour of both renal cortices of the.  No evidence of hydronephrosis or focal renal lesions.  The bladder is unremarkable.   Stable evidence of anterolisthesis of L5 on S1 with associated degenerative disc disease.  There are probable bilateral pars defects at L5.   IMPRESSION:   1.  Evidence of significant constipation with a large amount of fecal material present throughout the colon and some fecalization of distal small bowel enteric contents.  No evidence of small bowel obstruction.  No obvious evidence of obstructing colonic lesion. 2.  Stable 4 mm left lower lobe lung nodule. 3.  Stable anterolisthesis of L5 on S1 with associated spondylosis and spondylolysis.   Original Report Authenticated By: Azzie Roup, M.D.  ASSESSMENT/PLAN: 63 yo female with PMH adenomatous colon polyp, GERD with history of reflux esophagitis, hypertension, hyperlipidemia, mild COPD who is seen in consultation at the request of Dr.Gottschalk to evaluate episodic abdominal pain with diarrhea.  1. Episodic explosive diarrhea with abdominal pain and diaphoresis -- this is occurring intermittently and on average once per week. She was evaluated in 2013 for these symptoms and CT scan suggested significant constipation with equalization in the distal small bowel. It is possible that constipation is driving this process and she is having periods of overflow explosive diarrhea associated with colonic spasm and pain. Given her history adhesive disease is also possible with transient partial obstruction, though felt less likely.  She had had previous colonic biopsies negative for microscopic colitis. With this in mind, I am going to start her on Linzess 145 mcg daily.  Hopefully with more frequent bowel movements and better colonic evacuation she will not have episodic episodes of pain and diarrhea. We're also proceed to colonoscopy, see #2  2. History of adenomatous polyp with villous feature -- large polyp removed  piecemeal, though felt entirely in 2007. She is due for surveillance, in fact overdue. I recommended colonoscopy at this time and we discussed the procedure including risks and benefits and she is agreeable to proceed  3. GERD, uncontrolled -- she continues to have heartburn despite Dexilant 60 mg daily. I have recommended the addition of ranitidine 150 mg each p.m. along with upper endoscopy for uncontrolled reflux despite PPI. She did have some mild esophagitis on last upper endoscopy in 2007. GERD diet recommended.

## 2014-07-10 ENCOUNTER — Telehealth: Payer: Self-pay | Admitting: *Deleted

## 2014-07-10 NOTE — Telephone Encounter (Signed)
RECEIVED VIA FAX FROM [facility], PRIOR AUTH NEEDED FOR LINZESS 145 MCG. I CALLED [phone], NUMBER GIVEN BY PHARMACIST. I SPOKE WITH Spencer WAS APPROVED FOR ONE YEAR. [id number]. PHARMACY NOTIFIED.

## 2014-07-14 ENCOUNTER — Other Ambulatory Visit: Payer: Self-pay | Admitting: Sports Medicine

## 2014-07-17 ENCOUNTER — Other Ambulatory Visit: Payer: Self-pay | Admitting: *Deleted

## 2014-07-17 MED ORDER — DEXLANSOPRAZOLE 60 MG PO CPDR: DELAYED_RELEASE_CAPSULE | ORAL | Status: DC

## 2014-08-02 ENCOUNTER — Ambulatory Visit: Payer: Self-pay | Admitting: Family Medicine

## 2014-08-03 ENCOUNTER — Other Ambulatory Visit: Payer: Self-pay | Admitting: Sports Medicine

## 2014-08-03 DIAGNOSIS — L989 Disorder of the skin and subcutaneous tissue, unspecified: Secondary | ICD-10-CM

## 2014-08-03 MED ORDER — NYSTATIN 100000 UNIT/GM EX POWD: Freq: Three times a day (TID) | CUTANEOUS | Status: DC

## 2014-08-07 ENCOUNTER — Ambulatory Visit (AMBULATORY_SURGERY_CENTER): Payer: Medicare Other | Admitting: Internal Medicine

## 2014-08-07 ENCOUNTER — Encounter: Payer: Self-pay | Admitting: Internal Medicine

## 2014-08-07 VITALS — BP 164/98 | HR 68 | Temp 98.4°F | Resp 22 | Ht 62.0 in | Wt 169.0 lb

## 2014-08-07 DIAGNOSIS — K219 Gastro-esophageal reflux disease without esophagitis: Secondary | ICD-10-CM

## 2014-08-07 DIAGNOSIS — J449 Chronic obstructive pulmonary disease, unspecified: Secondary | ICD-10-CM | POA: Diagnosis not present

## 2014-08-07 DIAGNOSIS — Z86010 Personal history of colonic polyps: Secondary | ICD-10-CM | POA: Diagnosis not present

## 2014-08-07 DIAGNOSIS — I10 Essential (primary) hypertension: Secondary | ICD-10-CM | POA: Diagnosis not present

## 2014-08-07 DIAGNOSIS — K317 Polyp of stomach and duodenum: Secondary | ICD-10-CM | POA: Diagnosis not present

## 2014-08-07 DIAGNOSIS — D122 Benign neoplasm of ascending colon: Secondary | ICD-10-CM

## 2014-08-07 DIAGNOSIS — Z1211 Encounter for screening for malignant neoplasm of colon: Secondary | ICD-10-CM

## 2014-08-07 DIAGNOSIS — E669 Obesity, unspecified: Secondary | ICD-10-CM | POA: Diagnosis not present

## 2014-08-07 DIAGNOSIS — K635 Polyp of colon: Secondary | ICD-10-CM

## 2014-08-07 MED ORDER — SODIUM CHLORIDE 0.9 % IV SOLN: 500.0000 mL | INTRAVENOUS | Status: DC

## 2014-08-07 NOTE — Progress Notes (Signed)
Called to room to assist during endoscopic procedure.  Patient ID and intended procedure confirmed with present staff. Received instructions for my participation in the procedure from the performing physician.  

## 2014-08-07 NOTE — Op Note (Signed)
[place]  [address] [place], [id number]   ENDOSCOPY PROCEDURE REPORT  PATIENT: [name], [name]  MR#: [id number] BIRTHDATE: 15-Sep-1951 , 63  yrs. old GENDER: female ENDOSCOPIST: Jerene Bears, MD REFERRED BY:   Ronnie Doss, DO PROCEDURE DATE:  08/07/2014 PROCEDURE:  EGD w/ biopsy ASA CLASS:     Class III INDICATIONS:  history of GERD and heartburn. MEDICATIONS: Monitored anesthesia care and Propofol 300 mg IV TOPICAL ANESTHETIC: none  DESCRIPTION OF PROCEDURE: After the risks benefits and alternatives of the procedure were thoroughly explained, informed consent was obtained.  The LB [id number] endoscope was introduced through the mouth and advanced to the second portion of the duodenum , Without limitations.  The instrument was slowly withdrawn as the mucosa was fully examined.   ESOPHAGUS: The z-line was located 39cm from the incisors.  The z line appeared irregular with island of salmon colored mucosa and biopsies were taken.   The remaining esophageal mucosa was unremarkable  STOMACH: A sessile polyp measuring 5 mm in size was found on the lesser curvature of the gastric body.  Multiple biopsies was performed using cold forceps.   Patulous pylorus indicative of likely remote ulcer disease. Mild gastropathy in the gastric antrum and body, multiple biopsies obtained to exclude H. pylori  DUODENUM: The duodenal mucosa showed no abnormalities in the bulb and 2nd part of the duodenum.  Retroflexed views revealed no abnormalities.     The scope was then withdrawn from the patient and the procedure completed.  COMPLICATIONS: There were no immediate complications.  ENDOSCOPIC IMPRESSION: 1.   The z-line was located 39cm from the incisors; island of salmon colored mucosa biopsied 2.   Sessile polyp was found on the lesser curvature of the gastric body; multiple biopsies was performed 3.   Patulous pylorus and mild gastropathy; multiple  biopsies 3.   The duodenal mucosa showed no abnormalities in the bulb and 2nd part of the duodenum  RECOMMENDATIONS: 1.  Await biopsy results 2.  Continue taking your PPI (antiacid medicine) once daily.  It is best to be taken 20-30 minutes prior to breakfast meal.  eSigned:  Jerene Bears, MD 08/07/2014 3:25 PM CC:The Patient; Ronnie Doss, DO

## 2014-08-07 NOTE — Op Note (Signed)
[place]  [address] [place], [id number]   COLONOSCOPY PROCEDURE REPORT  PATIENT: [name], [name]  MR#: [id number] BIRTHDATE: 1951/04/02 , 63  yrs. old GENDER: female ENDOSCOPIST: Jerene Bears, MD PROCEDURE DATE:  08/07/2014 PROCEDURE:   Colonoscopy with snare polypectomy First Screening Colonoscopy - Avg.  risk and is 50 yrs.  old or older - No.  Prior Negative Screening - Now for repeat screening. N/A  History of Adenoma - Now for follow-up colonoscopy & has been > or = to 3 yrs.  Yes hx of adenoma.  Has been 3 or more years since last colonoscopy.  Polyps Removed Today? Yes. ASA CLASS:   Class III INDICATIONS:surveillance colonoscopy based on a history of adenomatous colonic polyp(s) and last colonoscopy in 2007. MEDICATIONS: Monitored anesthesia care, Residual sedation present, and Propofol 160 mg IV  DESCRIPTION OF PROCEDURE:   After the risks benefits and alternatives of the procedure were thoroughly explained, informed consent was obtained.  The digital rectal exam revealed no rectal mass.   The LB [id number]  endoscope was introduced through the anus and advanced to the cecum, which was identified by both the appendix and ileocecal valve. No adverse events experienced. The quality of the prep was good, using MoviPrep  The instrument was then slowly withdrawn as the colon was fully examined.   COLON FINDINGS: A sessile polyp was found in the sigmoid colon. The examination was otherwise normal.  Retroflexion was not performed due to a narrow rectal vault. The time to cecum=4 minutes 09 seconds.  Withdrawal time=11 minutes 27 seconds.  The scope was withdrawn and the procedure completed.  COMPLICATIONS: There were no immediate complications.  ENDOSCOPIC IMPRESSION: 1.   Sessile polyp was found in the sigmoid colon 2.   The examination was otherwise normal  RECOMMENDATIONS: 1.  Await pathology results 2.  Repeat Colonoscopy in 5  years. 3.  You will receive a letter within 1-2 weeks with the results of your biopsy as well as final recommendations.  Please call my office if you have not received a letter after 3 weeks. 4.  Given good response, continue Linzess daily for constipation  eSigned:  Jerene Bears, MD 08/07/2014 3:28 PM   cc: The Patient; Ronnie Doss, DO

## 2014-08-07 NOTE — Patient Instructions (Signed)
Discharge instructions given with verbal understanding. Handout on polyps. Biopsies taken. Resume previous medications. YOU HAD AN ENDOSCOPIC PROCEDURE TODAY AT [facility]: Refer to the procedure report that was given to you for any specific questions about what was found during the examination.  If the procedure report does not answer your questions, please call your gastroenterologist to clarify.  If you requested that your care partner not be given the details of your procedure findings, then the procedure report has been included in a sealed envelope for you to review at your convenience later.  YOU SHOULD EXPECT: Some feelings of bloating in the abdomen. Passage of more gas than usual.  Walking can help get rid of the air that was put into your GI tract during the procedure and reduce the bloating. If you had a lower endoscopy (such as a colonoscopy or flexible sigmoidoscopy) you may notice spotting of blood in your stool or on the toilet paper. If you underwent a bowel prep for your procedure, then you may not have a normal bowel movement for a few days.  DIET: Your first meal following the procedure should be a light meal and then it is ok to progress to your normal diet.  A half-sandwich or bowl of soup is an example of a good first meal.  Heavy or fried foods are harder to digest and may make you feel nauseous or bloated.  Likewise meals heavy in dairy and vegetables can cause extra gas to form and this can also increase the bloating.  Drink plenty of fluids but you should avoid alcoholic beverages for 24 hours.  ACTIVITY: Your care partner should take you home directly after the procedure.  You should plan to take it easy, moving slowly for the rest of the day.  You can resume normal activity the day after the procedure however you should NOT DRIVE or use heavy machinery for 24 hours (because of the sedation medicines used during the test).    SYMPTOMS TO REPORT  IMMEDIATELY: A gastroenterologist can be reached at any hour.  During normal business hours, 8:30 AM to 5:00 PM Monday through Friday, call [phone].  After hours and on weekends, please call the GI answering service at [phone] who will take a message and have the physician on call contact you.   Following lower endoscopy (colonoscopy or flexible sigmoidoscopy):  Excessive amounts of blood in the stool  Significant tenderness or worsening of abdominal pains  Swelling of the abdomen that is new, acute  Fever of 100F or higher  Following upper endoscopy (EGD)  Vomiting of blood or coffee ground material  New chest pain or pain under the shoulder blades  Painful or persistently difficult swallowing  New shortness of breath  Fever of 100F or higher  Black, tarry-looking stools  FOLLOW UP: If any biopsies were taken you will be contacted by phone or by letter within the next 1-3 weeks.  Call your gastroenterologist if you have not heard about the biopsies in 3 weeks.  Our staff will call the home number listed on your records the next business day following your procedure to check on you and address any questions or concerns that you may have at that time regarding the information given to you following your procedure. This is a courtesy call and so if there is no answer at the home number and we have not heard from you through the emergency physician on call, we will assume that you have  returned to your regular daily activities without incident.  SIGNATURES/CONFIDENTIALITY: You and/or your care partner have signed paperwork which will be entered into your electronic medical record.  These signatures attest to the fact that that the information above on your After Visit Summary has been reviewed and is understood.  Full responsibility of the confidentiality of this discharge information lies with you and/or your care-partner.

## 2014-08-07 NOTE — Progress Notes (Signed)
Procedure enfds, to recovery, report given and VSS.

## 2014-08-07 NOTE — Progress Notes (Signed)
Pt's blood pressure is elevated on arrival to [facility] in admissions.  179/104 left arm and 184/115 right arm.  I advised Holland Falling, CRNA of these blood pressures. maw

## 2014-08-08 ENCOUNTER — Other Ambulatory Visit: Payer: Self-pay | Admitting: Sports Medicine

## 2014-08-08 ENCOUNTER — Telehealth: Payer: Self-pay

## 2014-08-08 DIAGNOSIS — I10 Essential (primary) hypertension: Secondary | ICD-10-CM

## 2014-08-08 MED ORDER — CLONIDINE HCL 0.3 MG PO TABS: ORAL_TABLET | ORAL | Status: DC

## 2014-08-08 NOTE — Telephone Encounter (Signed)
2nd request.  Sent to covering provider for Dr. Lajuana Ripple.  Derl Barrow, RN

## 2014-08-08 NOTE — Telephone Encounter (Signed)
Left message on answering machine. 

## 2014-08-14 ENCOUNTER — Encounter: Payer: Self-pay | Admitting: Internal Medicine

## 2014-08-18 ENCOUNTER — Encounter: Payer: Self-pay | Admitting: Family Medicine

## 2014-08-18 ENCOUNTER — Ambulatory Visit (INDEPENDENT_AMBULATORY_CARE_PROVIDER_SITE_OTHER): Payer: Medicare Other | Admitting: Family Medicine

## 2014-08-18 VITALS — BP 115/80 | HR 77 | Temp 98.2°F | Ht 63.0 in | Wt 167.3 lb

## 2014-08-18 DIAGNOSIS — J441 Chronic obstructive pulmonary disease with (acute) exacerbation: Secondary | ICD-10-CM

## 2014-08-18 DIAGNOSIS — Z720 Tobacco use: Secondary | ICD-10-CM | POA: Diagnosis not present

## 2014-08-18 DIAGNOSIS — F17200 Nicotine dependence, unspecified, uncomplicated: Secondary | ICD-10-CM

## 2014-08-18 MED ORDER — IPRATROPIUM BROMIDE HFA 17 MCG/ACT IN AERS: 2.0000 | INHALATION_SPRAY | RESPIRATORY_TRACT | Status: DC | PRN

## 2014-08-18 MED ORDER — PREDNISONE 50 MG PO TABS: 50.0000 mg | ORAL_TABLET | Freq: Every day | ORAL | Status: AC

## 2014-08-18 MED ORDER — AZITHROMYCIN 250 MG PO TABS: ORAL_TABLET | ORAL | Status: DC

## 2014-08-18 NOTE — Progress Notes (Addendum)
Patient ID: [name], female   DOB: 08/31/1951, 63 y.o.   MRN: [id number]    Subjective: CC:f/u on cough  HPI: Patient is a 63 y.o. female presenting to clinic today for cough. Concerns today include:  1. Persistent cough Patient reports that cough is worse.  Instead of only being present at night, she is coughing during the day too.  Continues to smoke. Down to 8 cigs/day from 1 ppd.  This decrease has been over the last month.  Has decreased because of the cough.  Has not been using her inhalers because they make her have a strong pain in her chest.  She does not feel that she breathes better with the inhalers.  She has not noticed that the inhalers have helped with cough at all.  Patient went to beach August 1 and stayed for one week and was symptom free.  She reports that she has this cough every 6 months since moving into the apartment complex she lives in currently (3 yrs ago).  Denies coughing up blood.  Patient states that she has been increasingly tired and has completely lost appetite.  Both symptoms have been present for 1 month.  She has to make herself eat. Patient reports that she wakes up in the middle of night nauseated x1 month.  Admits to vomiting each time.  Vomiting mucus.  Admits to intermittent fevers x2-3 weeks.  Temperatures have been >101F.  She takes tylenol which helps.    History Reviewed: yes smoker. Smokes 8 cigs/d Smoking cessation stop date 10/20/14. Meds updated, no new FamHx or SocHx.  See EMR Health Maintenance: declines flu shot today  ROS: All other systems reviewed and are negative.  Objective: Office vital signs reviewed. BP 115/80  Pulse 77  Temp(Src) 98.2 F (36.8 C) (Oral)  Ht 5\' 3"  (1.6 m)  Wt 167 lb 4.8 oz (75.887 kg)  BMI 29.64 kg/m2  Physical Examination:  General: Awake, alert, well nourished, tired appearing female, NAD HEENT: Atraumatic, normocephalic    Neck: No masses palpated. No LAD    Nose: nasal turbinates moist  Throat: MMM, no erythema Cardio: RRR, S1S2 heard, no murmurs appreciated Pulm: globally decreased breath sounds, no wheezes, rhonchi or rales, no increased WOB, no retractions, Patient is able to speak in full sentences, +intermittent coughing  Extremities: WWP, No edema, cyanosis or clubbing; +2 posterior tibial pulses bilaterally MSK: Normal gait and station  03/2014 CXR reviewed.  Assessment/Plan: See Problem List and After Visit Summary  63 y.o. female with 1.COPD exacerbation:   -Zpak, Prednisone 50mg , Atrovent inhaler, Referral to Dr Valentina Lucks for PFTs.  -Smoking status discussed see Nicotine addiction.  -Red flags discussed.  -Patient encouraged to follow up after appointment with Dr Valentina Lucks or sooner if symptoms do not improve. 2.Nicotine addiction   Janora Norlander, DO PGY-1, [place]

## 2014-08-18 NOTE — Patient Instructions (Addendum)
It was a pleasure seeing you today!  Information regarding what we discussed is included in this packet.  Please feel free to call our office if any questions or concerns arise.  Plan to follow up with me in 4 weeks or sooner if symptoms worsen.  GET HELP RIGHT AWAY IF:  You have yellowish-white fluid (pus) in your thick spit.  Your cough gets worse.  Your medicine does not reduce coughing, and you are losing sleep.  You cough up blood.  You have trouble breathing.  Your pain gets worse and medicine does not help.  You have a fever.  Markia Kyer M. Brendaliz Kuk, DO  Chronic Obstructive Pulmonary Disease Chronic obstructive pulmonary disease (COPD) is a common lung problem. In COPD, the flow of air from the lungs is limited. The way your lungs work will probably never return to normal, but there are things you can do to improve your lungs and make yourself feel better. HOME CARE  Take all medicines as told by your doctor.  Avoid medicines or cough syrups that dry up your airway (such as antihistamines) and do not allow you to get rid of thick spit. You do not need to avoid them if told differently by your doctor.  If you smoke, stop. Smoking makes the problem worse.  Avoid being around things that make your breathing worse (like smoke, chemicals, and fumes).  Use oxygen therapy and therapy to help improve your lungs (pulmonary rehabilitation) if told by your doctor. If you need home oxygen therapy, ask your doctor if you should buy a tool to measure your oxygen level (oximeter).  Avoid people who have a sickness you can catch (contagious).  Avoid going outside when it is very hot, cold, or humid.  Eat healthy foods. Eat smaller meals more often. Rest before meals.  Stay active, but remember to also rest.  Make sure to get all the shots (vaccines) your doctor recommends. Ask your doctor if you need a pneumonia shot.  Learn and use tips on how to relax.  Learn and use tips on how  to control your breathing as told by your doctor. Try:  Breathing in (inhaling) through your nose for 1 second. Then, pucker your lips and breath out (exhale) through your lips for 2 seconds.  Putting one hand on your belly (abdomen). Breathe in slowly through your nose for 1 second. Your hand on your belly should move out. Pucker your lips and breathe out slowly through your lips. Your hand on your belly should move in as you breathe out.  Learn and use controlled coughing to clear thick spit from your lungs. The steps are: 1. Lean your head a little forward. 2. Breathe in deeply. 3. Try to hold your breath for 3 seconds. 4. Keep your mouth slightly open while coughing 2 times. 5. Spit any thick spit out into a tissue. 6. Rest and do the steps again 1 or 2 times as needed. GET HELP IF:  You cough up more thick spit than usual.  There is a change in the color or thickness of the spit.  It is harder to breathe than usual.  Your breathing is faster than usual. GET HELP RIGHT AWAY IF:   You have shortness of breath while resting.  You have shortness of breath that stops you from:  Being able to talk.  Doing normal activities.  You chest hurts for longer than 5 minutes.  Your skin color is more blue than usual.  Your pulse  oximeter shows that you have low oxygen for longer than 5 minutes. MAKE SURE YOU:   Understand these instructions.  Will watch your condition.  Will get help right away if you are not doing well or get worse. Document Released: 03/31/2008 Document Revised: 02/27/2014 Document Reviewed: 06/09/2013 [facility] Patient Information 2015 [place], [place]. This information is not intended to replace advice given to you by your health care provider. Make sure you discuss any questions you have with your health care provider.   Cough, Adult  A cough is a reflex. It helps you clear your throat and airways. A cough can help heal your body. A cough can last 2 or 3  weeks (acute) or may last more than 8 weeks (chronic). Some common causes of a cough can include an infection, allergy, or a cold. HOME CARE  Only take medicine as told by your doctor.  If given, take your medicines (antibiotics) as told. Finish them even if you start to feel better.  Use a cold steam vaporizer or humidifier in your home. This can help loosen thick spit (secretions).  Sleep so you are almost sitting up (semi-upright). Use pillows to do this. This helps reduce coughing.  Rest as needed.  Stop smoking if you smoke. MAKE SURE YOU:   Understand these instructions.  Will watch your condition.  Will get help right away if you are not doing well or get worse. Document Released: 06/26/2011 Document Revised: 02/27/2014 Document Reviewed: 06/26/2011 [facility] Patient Information 2015 [place], [place]. This information is not intended to replace advice given to you by your health care provider. Make sure you discuss any questions you have with your health care provider.

## 2014-08-18 NOTE — Assessment & Plan Note (Signed)
Patient smoking 8 cigs/ day x1 month.  Down from 1ppd. Planned stop date 10/20/2014 -Discussed cessation plan with patient -Will follow up after planned stop date.

## 2014-08-21 ENCOUNTER — Other Ambulatory Visit: Payer: Self-pay | Admitting: Family Medicine

## 2014-08-21 ENCOUNTER — Telehealth: Payer: Self-pay | Admitting: *Deleted

## 2014-08-21 ENCOUNTER — Ambulatory Visit: Payer: Self-pay | Admitting: Pharmacist

## 2014-08-21 MED ORDER — AEROCHAMBER PLUS FLO-VU LARGE MISC: 1.0000 | Freq: Once | Status: DC

## 2014-08-21 NOTE — Telephone Encounter (Signed)
Received fax from [facility] stating they need a Rx for a spacer or some breathing device.  Pt is prescribed prednisone 50 mg tabs.  Please give them a call at [phone] or send Rx.  Derl Barrow, RN

## 2014-08-21 NOTE — Telephone Encounter (Signed)
Done

## 2014-08-28 ENCOUNTER — Other Ambulatory Visit (HOSPITAL_COMMUNITY): Payer: Self-pay | Admitting: Psychiatry

## 2014-08-29 ENCOUNTER — Other Ambulatory Visit: Payer: Self-pay | Admitting: Internal Medicine

## 2014-08-30 ENCOUNTER — Ambulatory Visit (INDEPENDENT_AMBULATORY_CARE_PROVIDER_SITE_OTHER): Payer: Medicare Other | Admitting: Psychiatry

## 2014-08-30 ENCOUNTER — Encounter (HOSPITAL_COMMUNITY): Payer: Self-pay | Admitting: Psychiatry

## 2014-08-30 VITALS — BP 154/84 | HR 72 | Ht 62.0 in | Wt 172.6 lb

## 2014-08-30 DIAGNOSIS — F329 Major depressive disorder, single episode, unspecified: Secondary | ICD-10-CM | POA: Diagnosis not present

## 2014-08-30 DIAGNOSIS — F32A Depression, unspecified: Secondary | ICD-10-CM

## 2014-08-30 MED ORDER — BUPROPION HCL ER (XL) 300 MG PO TB24: 300.0000 mg | ORAL_TABLET | Freq: Every day | ORAL | Status: DC

## 2014-08-30 MED ORDER — MIRTAZAPINE 30 MG PO TABS: 30.0000 mg | ORAL_TABLET | Freq: Every day | ORAL | Status: DC

## 2014-08-30 NOTE — Progress Notes (Signed)
[facility] Progress Note  [name] [id number] 63 y.o.  08/30/2014 3:02 PM  Chief Complaint:  Medication management and followup.      History of Present Illness:   [name] came for her followup appointment. She is taking her medication as prescribed.  She moved to her sister's house because her sister has chronic health issues and she needed help.  Patient was unable to see and therapist because she has been busy taking care of her sister.  Overall her mood has been stable.  She denies any irritability, anger, mood swings.  She sleeping good.  Her appetite is okay.  Her vitals are stable.  She is not drinking or using any illegal substances.  Suicidal Ideation: No Plan Formed: No Patient has means to carry out plan: No  Homicidal Ideation: No Plan Formed: No Patient has means to carry out plan: No  Review of Systems: Psychiatric: Agitation: No Hallucination: No Depressed Mood: No Insomnia: No Hypersomnia: No Altered Concentration: No Feels Worthless: No Grandiose Ideas: No Belief In Special Powers: No New/Increased Substance Abuse: No Compulsions: No  Neurologic: Headache: Yes Seizure: No Paresthesias: No  Medical History;  She has history of hypertension, migraine headache, asthma, GERD, chronic pain.  She is seeing a physician at [facility]  internal medicine.    Outpatient Encounter Prescriptions as of 08/30/2014  Medication Sig  . albuterol (PROAIR HFA) 108 (90 BASE) MCG/ACT inhaler Inhale 2 puffs into the lungs as directed.  [name] atorvastatin (LIPITOR) 40 MG tablet TAKE 1 TABLET BY MOUTH ONCE DAILY  . budesonide-formoterol (SYMBICORT) 80-4.5 MCG/ACT inhaler Inhale 2 puffs into the lungs 2 (two) times daily.  [name] buPROPion (WELLBUTRIN XL) 300 MG 24 hr tablet Take 1 tablet (300 mg total) by mouth daily.  . cetirizine (ZYRTEC) 10 MG tablet Take 1 tablet (10 mg total) by mouth daily.  . Cholecalciferol (VITAMIN D3) 50000 UNITS CAPS Take 1 capsule by  mouth once a week.  . cloNIDine (CATAPRES) 0.3 MG tablet TAKE 1 1/2 TABLETS BY MOUTH AT BEDTIME  . fluticasone (FLONASE) 50 MCG/ACT nasal spray Place 2 sprays into both nostrils daily.  [name] gabapentin (NEURONTIN) 100 MG capsule TAKE 1 CAPSULE BY MOUTH THREE TIMES DAILY  . ipratropium (ATROVENT HFA) 17 MCG/ACT inhaler Inhale 2 puffs into the lungs every 4 (four) hours as needed for wheezing.  . Linaclotide (LINZESS) 145 MCG CAPS capsule Take 1 capsule (145 mcg total) by mouth daily.  [name] lisinopril-hydrochlorothiazide (PRINZIDE,ZESTORETIC) 10-12.5 MG per tablet TAKE 1 TABLET BY MOUTH DAILY  . mirtazapine (REMERON) 30 MG tablet Take 1 tablet (30 mg total) by mouth at bedtime.  [name] nystatin (MYCOSTATIN) powder Apply topically 3 (three) times daily.  . potassium chloride SA (K-DUR,KLOR-CON) 20 MEQ tablet TAKE 1 TABLET BY MOUTH DAILY  . ranitidine (ZANTAC) 150 MG tablet TAKE 1 TABLET BY MOUTH TWICE DAILY  . Spacer/Aero-Holding Chambers (AEROCHAMBER PLUS FLO-VU LARGE) MISC 1 each by Other route once.  . topiramate (TOPAMAX) 25 MG tablet Take 1 tablet (25 mg total) by mouth 2 (two) times daily.  . Vitamin D, Ergocalciferol, (DRISDOL) 50000 UNITS CAPS capsule TAKE 1 CAPSULE BY MOUTH ONCE A WEEK  . [DISCONTINUED] azithromycin (ZITHROMAX) 250 MG tablet Take 2 tablets (500mg ) by mouth on day 1, then take 1 tablet (250mg ) every day x4 days  . [DISCONTINUED] buPROPion (WELLBUTRIN XL) 300 MG 24 hr tablet Take 1 tablet (300 mg total) by mouth daily.  . [DISCONTINUED] mirtazapine (REMERON) 30 MG tablet Take 1  tablet (30 mg total) by mouth at bedtime.    No results found for this or any previous visit (from the past 72 hour(s)).  Past Psychiatric History/Hospitalization(s): She has history of psychiatric inpatient treatment at [facility].  She was admitted to get detox from her Xanax.  Patient also seeing psychiatrist when she was in [employer] in [place] in [place] and than [facility].   In the past she had tried Paxil Prozac Zoloft.  Patient denies any history of suicidal attempt or any psychosis. Anxiety: Yes Bipolar Disorder: No Depression: Yes Mania: No Psychosis: No Schizophrenia: No Personality Disorder: No Hospitalization for psychiatric illness: Yes History of Electroconvulsive Shock Therapy: No Prior Suicide Attempts: No  ROSConstitutional:  BP 154/84 mmHg  Pulse 72  Ht 5\' 2"  (1.575 m)  Wt 172 lb 9.6 oz (78.291 kg)  BMI 31.56 kg/m2  Musculoskeletal: Strength & Muscle Tone: within normal limits Gait & Station: normal  Mental Status Examination;  Patient is casually dressed and fairly groomed.  She is cooperative and maintained good eye contact.  Her speech is clear and coherent.  Her thought processes logical linear and goal-directed.  She described her mood is euthymic  and her affect is mood appropriate.  She denies any auditory or visual hallucination.  She denies any active or passive suicidal thoughts or homicidal thoughts.  There were no delusion or psychotic symptoms present at this time.  There were no flight of idea or loose association.  Her fund of knowledge is adequate.  There no tremors or shakes present.  Her attention and concentration is fair.  She's alert and oriented x3.  Her insight judgment and impulse control is okay.   Established Problem, Stable/Improving (1), Review of Psycho-Social Stressors (1), Review of Last Therapy Session (1) and Review of Medication Regimen & Side Effects (2)  Assessment: Axis I: Depressive disorder NOS  Axis II: Deferred  Axis III: See medical history  Axis IV: Moderate  Axis V: 60-65   Plan:  Patient is is stable on her current medication.  I will continue Remeron 30 mg and Wellbutrin XL 300 mg daily.  Patient was scheduled appointment when she moved back to her own place.  Currently she is living with her sister to help her.  Recommended to call us back if she has any question or any concern.   Follow-up in 3 months.  Etola Mull T., MD 08/30/2014

## 2014-09-01 NOTE — Telephone Encounter (Signed)
Chart reviewed. Refill was given 08/30/14. No longer needed.

## 2014-09-04 ENCOUNTER — Encounter: Payer: Self-pay | Admitting: Pharmacist

## 2014-09-04 ENCOUNTER — Ambulatory Visit (INDEPENDENT_AMBULATORY_CARE_PROVIDER_SITE_OTHER): Payer: Medicare Other | Admitting: Pharmacist

## 2014-09-04 ENCOUNTER — Other Ambulatory Visit: Payer: Self-pay | Admitting: Family Medicine

## 2014-09-04 VITALS — Ht 62.0 in | Wt 168.7 lb

## 2014-09-04 DIAGNOSIS — J449 Chronic obstructive pulmonary disease, unspecified: Secondary | ICD-10-CM

## 2014-09-04 DIAGNOSIS — Z720 Tobacco use: Secondary | ICD-10-CM

## 2014-09-04 DIAGNOSIS — F17200 Nicotine dependence, unspecified, uncomplicated: Secondary | ICD-10-CM

## 2014-09-04 NOTE — Assessment & Plan Note (Signed)
Chronic Tobacco Abuse - longstanding moderate nicotine dependence with recent reduction in cigarette use from 20 to 6 per day.   Plans to quit by earl December.  Offered additional support.  Patient appears motivated and ready to quit in the near future.  Reassess at next visit. May consider trial off of an inhaler in the future if she is able to quit smoking.

## 2014-09-04 NOTE — Assessment & Plan Note (Signed)
Spirometry evaluation reveals near normal lung function based on spirometry.  Has taked Symbicort which may impact results.  I am concerned she continues to have fever and poor appetite.  There may be other underlying causes to her cough that may be viral or bacterial.  I encouraged her to follow up with her primary care physician within the next week.  Consider reassessing lung function after addressing smoking cessation, apartment conditions, and lifestyle modification with exercise.    Chronic Tobacco Abuse - longstanding moderate nicotine dependence with recent reduction in cigarette use from 20 to 6 per day.   Plans to quit by earl December.  Offered additional support.  Patient appears motivated and ready to quit in the near future.  Reassess at next visit.   Patient has been experiencing cough for over a month and taking ipratropium and symbicort and appears to be compliant these medications.  No changes in medication therapy necessary at this time. Reviewed results of pulmonary function tests. May consider trial off of an inhaler in the future if she is able to quit smoking. Pt verbalized understanding of results and education.  Written pt instructions provided.  Total time in face to face counseling 15 minutes.  Patient seen with Hassie Bruce, Pharm D.

## 2014-09-04 NOTE — Patient Instructions (Addendum)
Thank you for visiting [place] today!  Your lung function test was normal.  No changes in medications are needed.  In order to prevent any acute exacerbations, please keep your albuterol.  Please continue to exercise and maintain your medication regimen.  We encourage you continue to work hard to quit smoking.  We are concerned you are still having fever and poor appetite, please follow up with your physician within the next week.

## 2014-09-04 NOTE — Telephone Encounter (Signed)
Will refill x1.  Would like patient to see me for f/u on chronic conditions.

## 2014-09-04 NOTE — Telephone Encounter (Signed)
Spoke to patient on phone regarding gabapentin.  She reports that she is using this for the neuropathy in her legs.  I advised her to make an appointment with me to review her chronic conditions. She is agreeable to this.  RF Gabapentin x1 Ashly M. Lajuana Ripple, DO

## 2014-09-04 NOTE — Progress Notes (Signed)
S:    Patient arrives on 09/04/2014.  Presents for lung function evaluation.  Patient reports breathing has been worse since the past month and coughing during the day.  She also continues to have fever and poor appetite.  She does not use her albuterol inhaler because it causes her chest pain and does not appear to be helping with her cough.  However did take her Symbicort.  Patient continues to smoke but appears determined to stop.  Currently, she smokes about 6 cig/day (down from 1 pack per day over the month).  She has several motivating factors (grandchildren, dental health, and family support) to stop smoking.    She speaks of potential environmental factors (apartment) that may be contributing to her cough.    O: See Documentation Flowsheet - CAT/COPD for complete symptom scoring. See "scanned report" or Documentation Flowsheet (discrete results - PFTs) for  Spirometry results. Patient provided good effort while attempting spirometry.   Lung Age = 71 years  FVC: 2.16, 2.21, 2.31 FEV1: 71%, 78%, 77%  A/P:  Spirometry evaluation reveals near normal lung function based on spirometry.  Has taked Symbicort which may impact results.  I am concerned she continues to have fever and poor appetite.  There may be other underlying causes to her cough that may be viral or bacterial.  I encouraged her to follow up with her primary care physician within the next week.  Consider reassessing lung function after addressing smoking cessation, apartment conditions, and lifestyle modification with exercise.    Chronic Tobacco Abuse - longstanding moderate nicotine dependence with recent reduction in cigarette use from 20 to 6 per day.   Plans to quit by earl December.  Offered additional support.  Patient appears motivated and ready to quit in the near future.  Reassess at next visit.   Patient has been experiencing cough for over a month and taking ipratropium and symbicort and appears to be compliant these  medications.  No changes in medication therapy necessary at this time. Reviewed results of pulmonary function tests. May consider trial off of an inhaler in the future if she is able to quit smoking. Pt verbalized understanding of results and education.  Written pt instructions provided.  Total time in face to face counseling 15 minutes.  Patient seen with Hassie Bruce, Pharm D.

## 2014-09-04 NOTE — Progress Notes (Signed)
Patient ID: [name], female   DOB: Aug 20, 1951, 63 y.o.   MRN: [id number] Reviewed: agree with Dr. Graylin Shiver documentation and management.

## 2014-09-14 ENCOUNTER — Other Ambulatory Visit: Payer: Self-pay | Admitting: Internal Medicine

## 2014-09-30 ENCOUNTER — Other Ambulatory Visit: Payer: Self-pay | Admitting: Obstetrics and Gynecology

## 2014-10-02 ENCOUNTER — Ambulatory Visit (INDEPENDENT_AMBULATORY_CARE_PROVIDER_SITE_OTHER): Payer: Medicare Other | Admitting: Family Medicine

## 2014-10-02 ENCOUNTER — Ambulatory Visit (INDEPENDENT_AMBULATORY_CARE_PROVIDER_SITE_OTHER): Payer: Medicare Other | Admitting: *Deleted

## 2014-10-02 ENCOUNTER — Encounter: Payer: Self-pay | Admitting: Family Medicine

## 2014-10-02 ENCOUNTER — Telehealth: Payer: Self-pay | Admitting: *Deleted

## 2014-10-02 VITALS — BP 146/78 | HR 65 | Temp 98.1°F | Ht 62.0 in | Wt 169.8 lb

## 2014-10-02 DIAGNOSIS — I10 Essential (primary) hypertension: Secondary | ICD-10-CM

## 2014-10-02 DIAGNOSIS — Z23 Encounter for immunization: Secondary | ICD-10-CM | POA: Diagnosis not present

## 2014-10-02 DIAGNOSIS — Z299 Encounter for prophylactic measures, unspecified: Secondary | ICD-10-CM

## 2014-10-02 DIAGNOSIS — Z418 Encounter for other procedures for purposes other than remedying health state: Secondary | ICD-10-CM

## 2014-10-02 DIAGNOSIS — R053 Chronic cough: Secondary | ICD-10-CM

## 2014-10-02 DIAGNOSIS — R05 Cough: Secondary | ICD-10-CM

## 2014-10-02 DIAGNOSIS — R059 Cough, unspecified: Secondary | ICD-10-CM | POA: Insufficient documentation

## 2014-10-02 LAB — BASIC METABOLIC PANEL
BUN: 10 mg/dL (ref 6–23)
CO2: 21 mEq/L (ref 19–32)
Calcium: 9.9 mg/dL (ref 8.4–10.5)
Chloride: 108 mEq/L (ref 96–112)
Creat: 1.18 mg/dL — ABNORMAL HIGH (ref 0.50–1.10)
GLUCOSE: 138 mg/dL — AB (ref 70–99)
Potassium: 3.9 mEq/L (ref 3.5–5.3)
SODIUM: 139 meq/L (ref 135–145)

## 2014-10-02 MED ORDER — CLONIDINE HCL 0.3 MG PO TABS
ORAL_TABLET | ORAL | Status: DC
Start: 2014-10-02 — End: 2014-12-18

## 2014-10-02 NOTE — Assessment & Plan Note (Addendum)
Patient here with ongoing cough.  CXR in June unrevealing.  Patient given antibiotic and steroid burst at last visit with good response but recurrence of cough.  Patient still with possible mold problem in home.  Given smoking history and intermittent fevers, I'm concerned about possible malignant etiology.  Renal function earlier this year normal.  -Repeat BMET for kidney function -CT chest with contrast -Ativan 1mg  30 minutes before procedure -Smoking cessation encouraged -Patient to follow up with me within 1 week of CT

## 2014-10-02 NOTE — Patient Instructions (Signed)
It was a pleasure seeing you today!  Information regarding what we discussed is included in this packet.  Please feel free to call our office if any questions or concerns arise.  We are ordering a blood test to look at your kidney function.  I have also ordered a Chest CT scan to see if there is anything we can see that may be causing your symptoms.  I encourage you to continue working on your smoking cessation.  You are doing a great job so far.  Keep taking your inhalers as directed.  Plan to follow up with me about 1 week after you get the chest CT.  Danessa Mensch M. Lajuana Ripple, DO

## 2014-10-02 NOTE — Assessment & Plan Note (Signed)
Patient received flu vaccine today. 

## 2014-10-02 NOTE — Telephone Encounter (Signed)
Patient notified of appointment.  

## 2014-10-02 NOTE — Assessment & Plan Note (Addendum)
Slightly above goal today at 146/78.  No red flags -Clonidine reordered -Will continue to monitor

## 2014-10-02 NOTE — Progress Notes (Signed)
Patient ID: TINZLEE CRAKER, female   DOB: [birth year], 63 y.o.   MRN: [id number]    Subjective: YK:ZLDJT, fevers HPI: Patient is a 63 y.o. female presenting to clinic today for cough & fevers. Concerns today include:  Cough and fevers: Patient reports that antibiotic and steroid burst given last time worked well.  She reports that fevers and cough resolved for about 1.5 weeks.  Then they returned.  She reports that Landlord pulled up carpet but still has not resolved the mold in her apartment.  She reports that coughing spells are stable from last visit.  Last about 15 mins at a time and occur about 2-3 times daily, worse at night.  She uses cough drops with little to no relief.  Patient reports that she is compliant with COPD inhalers. Fevers subjective.  Feels warm twice weekly, occurs during day and night.  Smoking 6 cigs/ day.  Goal still Christmas for cessation.  Quitting on her own.  No patches/gums.  Appetite still poor.  She reports that she eats maybe twice weekly.  She feels full too fast and her teeth hurt.  She reports that she is in need of a dentist to have some teeth removed.  Last dental visit Sept, 2015.    HTN: Does not monitor BP at home.  Patient reports that she is compliant with medications.  She need refill on clonidine today.  No adverse side effects.  No CP, vision changes, dizziness.  Endorses cough and occasional SOB.  History Reviewed: yes smoker. Health Maintenance: flu today  ROS: All other systems reviewed and are negative.  Objective: Office vital signs reviewed. BP 146/78 mmHg  Pulse 65  Temp(Src) 98.1 F (36.7 C) (Oral)  Ht 5\' 2"  (1.575 m)  Wt 169 lb 12.8 oz (77.021 kg)  BMI 31.05 kg/m2  Physical Examination:  General: Awake, alert, well nourished, tired appearing female, NAD HEENT: Atraumatic, normocephalic    Neck: No masses palpated. No LAD    Eyes: EOMI    Throat: MMM, no erythema, several teeth missing, poor dentition, no evidence of  abscess or infection Cardio: RRR, S1S2 heard, no murmurs appreciated, brisk cap refill Pulm: CTAB, no wheezes, rhonchi or rales, no increased WOB MSK: Normal gait and station Neuro: no focal deficits, follows commands  Assessment: 63 y.o. female with chronic cough concerning for possible malignant etiology  Plan: See Problem List and After Visit Summary   Janora Norlander, DO PGY-1, [place]

## 2014-10-02 NOTE — Telephone Encounter (Signed)
LVM for patient to call back, to inform of CT appointment set up.  Appointment is for Tuesday 10/10/2014 at 11:00am at [facility] on the 1st floor.  Patient should arrive by 10:45am and should have nothing to eat after 7am.

## 2014-10-03 MED ORDER — LORAZEPAM 1 MG PO TABS: ORAL_TABLET | ORAL | Status: DC

## 2014-10-03 NOTE — Addendum Note (Signed)
Addended by: Janora Norlander on: 10/03/2014 09:35 AM   Modules accepted: Orders

## 2014-10-10 ENCOUNTER — Ambulatory Visit (HOSPITAL_COMMUNITY)
Admission: RE | Admit: 2014-10-10 | Discharge: 2014-10-10 | Disposition: A | Discharge status: Home / Self Care | Payer: Medicare Other | Source: Ambulatory Visit | Attending: Family Medicine | Admitting: Family Medicine

## 2014-10-10 DIAGNOSIS — R053 Chronic cough: Secondary | ICD-10-CM

## 2014-10-10 DIAGNOSIS — R05 Cough: Secondary | ICD-10-CM | POA: Diagnosis not present

## 2014-10-10 DIAGNOSIS — Z720 Tobacco use: Secondary | ICD-10-CM | POA: Diagnosis not present

## 2014-10-10 DIAGNOSIS — J439 Emphysema, unspecified: Secondary | ICD-10-CM | POA: Diagnosis not present

## 2014-10-10 MED ORDER — IOHEXOL 300 MG/ML  SOLN
80.0000 mL | Freq: Once | INTRAMUSCULAR | Status: AC | PRN
  Administered 2014-10-10: 80 mL via INTRAVENOUS

## 2014-10-12 ENCOUNTER — Encounter: Payer: Self-pay | Admitting: Family Medicine

## 2014-10-12 ENCOUNTER — Telehealth: Payer: Self-pay | Admitting: Family Medicine

## 2014-10-12 ENCOUNTER — Ambulatory Visit: Payer: Self-pay | Admitting: Family Medicine

## 2014-10-12 NOTE — Telephone Encounter (Signed)
Tried to call patient for f/u on NEGATIVE results of CT.  Patient with several BENIGN pulmonary nodules but no evidence of pathology on CT chest.  Please have her follow up if coughing persists.  I believe etiology to be d/t underlying COPD.  [name] M. Lajuana Ripple, DO PGY-1, [place]

## 2014-10-12 NOTE — Telephone Encounter (Signed)
Spoke with patient and informed her of below 

## 2014-11-03 ENCOUNTER — Encounter: Payer: Self-pay | Admitting: Family Medicine

## 2014-11-03 ENCOUNTER — Ambulatory Visit (INDEPENDENT_AMBULATORY_CARE_PROVIDER_SITE_OTHER): Payer: Medicare Other | Admitting: Family Medicine

## 2014-11-03 VITALS — Temp 98.0°F | Ht 62.0 in | Wt 172.1 lb

## 2014-11-03 DIAGNOSIS — R05 Cough: Secondary | ICD-10-CM

## 2014-11-03 DIAGNOSIS — R053 Chronic cough: Secondary | ICD-10-CM

## 2014-11-03 DIAGNOSIS — E782 Mixed hyperlipidemia: Secondary | ICD-10-CM | POA: Diagnosis not present

## 2014-11-03 DIAGNOSIS — Z720 Tobacco use: Secondary | ICD-10-CM

## 2014-11-03 DIAGNOSIS — F17200 Nicotine dependence, unspecified, uncomplicated: Secondary | ICD-10-CM

## 2014-11-03 MED ORDER — ATORVASTATIN CALCIUM 40 MG PO TABS: 40.0000 mg | ORAL_TABLET | Freq: Every day | ORAL | Status: DC

## 2014-11-03 NOTE — Patient Instructions (Addendum)
It was a pleasure seeing you today!  Information regarding what we discussed is included in this packet.  Please feel free to call our office if any questions or concerns arise.  Expect to get a call from our social worker Ms Constance Holster in the next week or so regarding housing options.  If you need anything else in the meantime, call me.   Schedule a physical with me.   Stepanie Graver M. Lajuana Ripple, DO PGY-1, [place]

## 2014-11-03 NOTE — Progress Notes (Signed)
Patient ID: [name], female   DOB: [birth year], 64 y.o.   MRN: [id number]    Subjective: CC:CT scan f/u on chronic cough HPI: Patient is a 64 y.o. female presenting to clinic today for cough. Concerns today include:  Chronic cough CT scan reviewed.  Patient reports that she continues to have chronic cough when in her apartment.  She states that she notices it most in her bedroom vs other parts of the home.  She states that her apartment manager has still not addressed potential mold problem in her home.  She wants to move if possible but cannot for financial reasons.  She reports that she stopped smoking in December but then began smoking again last week because of stress from her sister's recent hospitalization in the ICU.  She admits to phlegm.  Denies blood in sputum.  Denies fevers, chills, weight loss.   History Reviewed: yes smoker. Health Maintenance: Mammogram overdue  ROS: All other systems reviewed and are negative. No CP, no vision changes  Objective: Office vital signs reviewed. Temp(Src) 98 F (36.7 C) (Oral)  Ht 5\' 2"  (1.575 m)  Wt 172 lb 1.6 oz (78.064 kg)  BMI 31.47 kg/m2  Physical Examination:  General: Awake, alert, well nourished female, NAD Cardio: RRR, S1S2 heard, no murmurs appreciated Pulm: CTAB, no wheezes, rhonchi or rales, good air movement Extremities: WWP, No edema, cyanosis or clubbing; +2 pulses bilaterally  Assessment: 64 y.o. female with chronic cough  Plan: See Problem List and After Visit Summary   Janora Norlander, DO PGY-1, [place]

## 2014-11-03 NOTE — Assessment & Plan Note (Signed)
Patient continues to have chronic cough associated with her apartment only.  Seen by Dr Valentina Lucks a few months ago with normal PFTs.  Compliant with medications.  No fevers.  No red flag symptoms.  Patient has restarted smoking last week after quitting before Christmas d/t stress from sister's hospitalization.  Patient is Contemplative.   -CT scan results reviewed with patient.  -Patient is willing to move if she can be helped to find an affordable place to live. -[name], [place] to help with this -Patient agreeable to above and is awaiting call. -will establish new quit date at next appointment.  Patient is not ready to quit in light of sister's illness.   -Smoking cessation encouraged -Patient to make PE appointment for labs

## 2014-11-03 NOTE — Assessment & Plan Note (Signed)
Contemplative stage -Continue counseling and support for smoking cessation -To make a new stop date goal once patient has gotten through stress of sister's illness

## 2014-11-06 ENCOUNTER — Other Ambulatory Visit: Payer: Self-pay | Admitting: Obstetrics and Gynecology

## 2014-11-07 NOTE — Progress Notes (Signed)
CSW contacted pt to provide her with resources regarding concern of mold in her apartment. Pt states she has already notified her landlord however they have not inspected her apartment. CSW provided pt with the contact number for the [facility]'s Cold & violations complaints department. CSW also mailed pt a list of affordable apartments in her area in the instance that she decides to move. CSW also faxed a referral to the [employer] informing them of the concern. Pt very appreciative of CSW support.  Hunt Oris, MSW, [place]

## 2014-11-22 ENCOUNTER — Other Ambulatory Visit: Payer: Self-pay | Admitting: Obstetrics and Gynecology

## 2014-11-23 ENCOUNTER — Other Ambulatory Visit: Payer: Self-pay | Admitting: Sports Medicine

## 2014-11-26 ENCOUNTER — Other Ambulatory Visit (HOSPITAL_COMMUNITY): Payer: Self-pay | Admitting: Psychiatry

## 2014-11-27 ENCOUNTER — Other Ambulatory Visit (HOSPITAL_COMMUNITY): Payer: Self-pay | Admitting: Psychiatry

## 2014-11-28 ENCOUNTER — Other Ambulatory Visit (HOSPITAL_COMMUNITY): Payer: Self-pay | Admitting: Psychiatry

## 2014-11-28 NOTE — Telephone Encounter (Signed)
Patient has an appointment with Dr. Adele Schilder on 11/30/14 and should have enough medication until then.  Requested pharmacy request again if patient does not have enough until evaluation.

## 2014-11-28 NOTE — Telephone Encounter (Signed)
Patient is scheduled 11/30/14 and should have enough medication until appointment based on last refill.

## 2014-11-30 ENCOUNTER — Ambulatory Visit (HOSPITAL_COMMUNITY): Payer: Self-pay | Admitting: Psychiatry

## 2014-12-01 DIAGNOSIS — H1045 Other chronic allergic conjunctivitis: Secondary | ICD-10-CM | POA: Diagnosis not present

## 2014-12-06 ENCOUNTER — Encounter: Payer: Self-pay | Admitting: Family Medicine

## 2014-12-06 ENCOUNTER — Ambulatory Visit (INDEPENDENT_AMBULATORY_CARE_PROVIDER_SITE_OTHER): Payer: Medicare Other | Admitting: Family Medicine

## 2014-12-06 VITALS — BP 157/94 | HR 68 | Temp 98.2°F | Ht 62.0 in | Wt 168.3 lb

## 2014-12-06 DIAGNOSIS — J019 Acute sinusitis, unspecified: Secondary | ICD-10-CM | POA: Insufficient documentation

## 2014-12-06 DIAGNOSIS — J0100 Acute maxillary sinusitis, unspecified: Secondary | ICD-10-CM | POA: Diagnosis not present

## 2014-12-06 MED ORDER — OLOPATADINE HCL 0.2 % OP SOLN: 1.0000 [drp] | Freq: Every day | OPHTHALMIC | Status: DC

## 2014-12-06 MED ORDER — AMOXICILLIN-POT CLAVULANATE 875-125 MG PO TABS: 1.0000 | ORAL_TABLET | Freq: Two times a day (BID) | ORAL | Status: DC

## 2014-12-06 NOTE — Progress Notes (Signed)
   Subjective:    Patient ID: [name], female    DOB: 1950/12/27, 64 y.o.   MRN: [id number]  HPI 64 year old female with hypertension, hyperlipidemia, COPD, and chronic cough persist for same day appointment with complaints of URI symptoms.  1) URI  Patient reports that she has not been feeling well for the past 2 weeks.  Patient endorses worsening cough which is mildly productive (clear mucus).  She also has associated chills and nasal congestion.  No associated fever. No reports of shortness of breath or chest pain.  Patient does note significant sinus tenderness.  Patient also notes associated watering of her eyes particularly the left.  Review of Systems Per HPI    Objective:   Physical Exam Filed Vitals:   12/06/14 1442  BP: 157/94  Pulse: 68  Temp: 98.2 F (36.8 C)   Exam: General: Chronically ill-appearing elderly female in no acute distress. HEENT: NCAT. Normal TMs bilaterally. Oropharynx clear.  Marked maxillary sinus tenderness to palpation. Neck: Supple no adenopathy. Cardiovascular: RRR. No murmurs, rubs, or gallops. Respiratory: CTAB. No rales, rhonchi, or wheeze.    Assessment & Plan:  See Problem List

## 2014-12-06 NOTE — Assessment & Plan Note (Signed)
History and physical exam consistent with acute sinusitis. Will treat with Augmentin. Patient also given Pataday drops for eye watering.

## 2014-12-06 NOTE — Patient Instructions (Signed)
It was nice seeing you today.  I have prescribed an antibiotic as well as an eye drop for you.  Take as prescribed.  Follow up with your PCP if you fail to improve or worsen.  Take care  Dr. Lacinda Axon

## 2014-12-07 ENCOUNTER — Telehealth (HOSPITAL_COMMUNITY): Payer: Self-pay | Admitting: *Deleted

## 2014-12-07 NOTE — Telephone Encounter (Signed)
Patient called and left message on voice mail up front requesting all appointment times because she lost appointment cards. Called patient back and left message on her machine to call us back.

## 2014-12-12 ENCOUNTER — Ambulatory Visit (HOSPITAL_COMMUNITY): Payer: Self-pay | Admitting: Psychiatry

## 2014-12-14 ENCOUNTER — Ambulatory Visit (HOSPITAL_COMMUNITY): Payer: Self-pay | Admitting: Psychiatry

## 2014-12-14 ENCOUNTER — Telehealth: Payer: Self-pay | Admitting: Family Medicine

## 2014-12-14 ENCOUNTER — Encounter (HOSPITAL_COMMUNITY): Payer: Self-pay | Admitting: Clinical

## 2014-12-14 ENCOUNTER — Ambulatory Visit (INDEPENDENT_AMBULATORY_CARE_PROVIDER_SITE_OTHER): Payer: Medicare Other | Admitting: Clinical

## 2014-12-14 DIAGNOSIS — F4321 Adjustment disorder with depressed mood: Secondary | ICD-10-CM

## 2014-12-14 DIAGNOSIS — F331 Major depressive disorder, recurrent, moderate: Secondary | ICD-10-CM
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
## 2014-12-14 NOTE — Telephone Encounter (Signed)
Has yeast infection from taking antibotic Could you call something in?

## 2014-12-14 NOTE — Telephone Encounter (Signed)
Called to touch base with patient.  No answer.  Left voicemail to call us back. Normally, this would require an office visit.  However, I know the patient well and would be willing to send something in but want to speak with her first to make sure that it is appropriate.  Queenie Aufiero M. Lajuana Ripple, DO PGY-1, [place]

## 2014-12-15 ENCOUNTER — Other Ambulatory Visit: Payer: Self-pay | Admitting: Family Medicine

## 2014-12-15 ENCOUNTER — Telehealth: Payer: Self-pay | Admitting: Family Medicine

## 2014-12-15 DIAGNOSIS — B379 Candidiasis, unspecified: Secondary | ICD-10-CM

## 2014-12-15 DIAGNOSIS — T3695XA Adverse effect of unspecified systemic antibiotic, initial encounter: Principal | ICD-10-CM

## 2014-12-15 MED ORDER — FLUCONAZOLE 150 MG PO TABS: 150.0000 mg | ORAL_TABLET | Freq: Once | ORAL | Status: DC

## 2014-12-15 NOTE — Telephone Encounter (Signed)
Called back again and LVM.  Charlaine Utsey M. Lajuana Ripple, DO PGY-1, [place]

## 2014-12-15 NOTE — Telephone Encounter (Signed)
Need something for yeast infection due to antiobiotic patient was given.

## 2014-12-15 NOTE — Progress Notes (Signed)
Spoke to patient on phone.  She informed me that she has been having a burning itching sensation since finishing the antibiotic given to her for her URI.  She says this happens a lot when she finishes any course of antibiotics.  She describes an itching, burning sensation around the vulvar area.  Denies any dysuria, fevers, chills, nausea or vomiting.  Will send in Diflucan 150mg  PO x1.  LFTs reviewed and are WNL.  Advised patient should symptoms not resolve to please schedule an appointment to be seen.  Patient voices good understanding.  Kenith Trickel M. Lajuana Ripple, DO PGY-1, [place]

## 2014-12-18 ENCOUNTER — Ambulatory Visit (INDEPENDENT_AMBULATORY_CARE_PROVIDER_SITE_OTHER): Payer: Medicare Other | Admitting: Psychiatry

## 2014-12-18 ENCOUNTER — Encounter: Payer: Self-pay | Admitting: *Deleted

## 2014-12-18 VITALS — BP 147/80 | HR 64 | Ht 62.0 in | Wt 164.8 lb

## 2014-12-18 DIAGNOSIS — F329 Major depressive disorder, single episode, unspecified: Secondary | ICD-10-CM | POA: Diagnosis not present

## 2014-12-18 DIAGNOSIS — F32A Depression, unspecified: Secondary | ICD-10-CM

## 2014-12-18 MED ORDER — MIRTAZAPINE 30 MG PO TABS: 30.0000 mg | ORAL_TABLET | Freq: Every day | ORAL | Status: DC

## 2014-12-18 MED ORDER — HYDROXYZINE PAMOATE 25 MG PO CAPS: 25.0000 mg | ORAL_CAPSULE | Freq: Every day | ORAL | Status: DC | PRN

## 2014-12-18 MED ORDER — BUPROPION HCL ER (XL) 300 MG PO TB24: 300.0000 mg | ORAL_TABLET | Freq: Every day | ORAL | Status: DC

## 2014-12-18 NOTE — Progress Notes (Signed)
[facility] [phone] Progress Note  [name] [id number] 64 y.o.  12/18/2014 3:06 PM  Chief Complaint:  My sister died 4 weeks ago.  I have a lot of crying spells I cannot sleep.        History of Present Illness:   [name] came for her followup appointment.  She is complaining of increased crying spells, depressive thoughts and anxiety symptoms.  Her sister died 4 weeks ago due to chronic health issues.  She was very close to her sister.  Recently she was involved in taking care of her .  Since sister deceased she's been struggling and going through grief.  She started seeing Dub Mikes in this office for counseling.  She admitted poor sleep, crying spells and feels that her current medicine is not working very well.  She also have recently seen her primary care physician for chronic health issues and breathing problem.  She was given steroids and inhaler .  She is compliant with the Remeron and Wellbutrin but she feels that her current medicine is not helping her anxiety.  She denies any suicidal thoughts or homicidal thought but admitted feeling of hopelessness and sadness.  Patient denies drinking or using any illegal substances.  She lives by herself.  She feels sometimes very lonely and sad because her children's are not around and they don't talk to her as they used to in the past.  Overall she reported her appetite is okay and her vitals are stable.  Suicidal Ideation: No Plan Formed: No Patient has means to carry out plan: No  Homicidal Ideation: No Plan Formed: No Patient has means to carry out plan: No  Review of Systems: Psychiatric: Agitation: No Hallucination: No Depressed Mood: No Insomnia: No Hypersomnia: No Altered Concentration: No Feels Worthless: No Grandiose Ideas: No Belief In Special Powers: No New/Increased Substance Abuse: No Compulsions: No  Neurologic: Headache: Yes Seizure: No Paresthesias: No  Medical History;  She has history of  hypertension, migraine headache, asthma, GERD, chronic pain.  She is seeing a physician at [facility]  internal medicine.    Outpatient Encounter Prescriptions as of 12/18/2014  Medication Sig  . albuterol (PROAIR HFA) 108 (90 BASE) MCG/ACT inhaler Inhale 2 puffs into the lungs as directed.  [name] atorvastatin (LIPITOR) 40 MG tablet Take 1 tablet (40 mg total) by mouth daily.  . budesonide-formoterol (SYMBICORT) 80-4.5 MCG/ACT inhaler Inhale 2 puffs into the lungs 2 (two) times daily.  [name] buPROPion (WELLBUTRIN XL) 300 MG 24 hr tablet Take 1 tablet (300 mg total) by mouth daily.  . cetirizine (ZYRTEC) 10 MG tablet Take 1 tablet (10 mg total) by mouth daily.  . Cholecalciferol (VITAMIN D3) 50000 UNITS CAPS Take 1 capsule by mouth once a week.  . cloNIDine (CATAPRES) 0.3 MG tablet TAKE 1&1/2 TABLETS BY MOUTH AT BEDTIME  . fluticasone (FLONASE) 50 MCG/ACT nasal spray Place 2 sprays into both nostrils daily.  [name] gabapentin (NEURONTIN) 100 MG capsule TAKE ONE CAPSULE BY MOUTH THREE TIMES DAILY  . LINZESS 145 MCG CAPS capsule TAKE 1 CAPSULE BY MOUTH ONCE DAILY  . lisinopril-hydrochlorothiazide (PRINZIDE,ZESTORETIC) 10-12.5 MG per tablet TAKE 1 TABLET BY MOUTH DAILY  . mirtazapine (REMERON) 30 MG tablet Take 1 tablet (30 mg total) by mouth at bedtime.  . Olopatadine HCl 0.2 % SOLN Apply 1 drop to eye daily.  . potassium chloride SA (K-DUR,KLOR-CON) 20 MEQ tablet TAKE 1 TABLET BY MOUTH DAILY  . ranitidine (ZANTAC) 150 MG tablet TAKE 1 TABLET BY MOUTH  TWICE DAILY  . Spacer/Aero-Holding Chambers (AEROCHAMBER PLUS FLO-VU LARGE) MISC 1 each by Other route once.  . topiramate (TOPAMAX) 25 MG tablet Take 1 tablet (25 mg total) by mouth 2 (two) times daily.  . Vitamin D, Ergocalciferol, (DRISDOL) 50000 UNITS CAPS capsule TAKE 1 CAPSULE BY MOUTH ONCE A WEEK  . [DISCONTINUED] amoxicillin-clavulanate (AUGMENTIN) 875-125 MG per tablet Take 1 tablet by mouth 2 (two) times daily.  . [DISCONTINUED] buPROPion (WELLBUTRIN XL) 300  MG 24 hr tablet Take 1 tablet (300 mg total) by mouth daily.  . [DISCONTINUED] cloNIDine (CATAPRES) 0.3 MG tablet TAKE 1 1/2 TABLETS BY MOUTH AT BEDTIME  . [DISCONTINUED] fluconazole (DIFLUCAN) 150 MG tablet Take 1 tablet (150 mg total) by mouth once.  . [DISCONTINUED] ipratropium (ATROVENT HFA) 17 MCG/ACT inhaler Inhale 2 puffs into the lungs every 4 (four) hours as needed for wheezing.  . [DISCONTINUED] LORazepam (ATIVAN) 1 MG tablet Take 1 tablet (1mg ) 30 minutes before CT scan.  . [DISCONTINUED] mirtazapine (REMERON) 30 MG tablet Take 1 tablet (30 mg total) by mouth at bedtime.  . [DISCONTINUED] nystatin (MYCOSTATIN) powder Apply topically 3 (three) times daily. (Patient taking differently: Apply topically as needed. )  . hydrOXYzine (VISTARIL) 25 MG capsule Take 1 capsule (25 mg total) by mouth daily as needed for anxiety.    No results found for this or any previous visit (from the past 72 hour(s)).  Past Psychiatric History/Hospitalization(s): She has history of psychiatric inpatient treatment at [facility].  She was admitted to get detox from her Xanax.  Patient also seeing psychiatrist when she was in [employer] in [place] in [place] and than [facility].  In the past she had tried Paxil Prozac Zoloft.  Patient denies any history of suicidal attempt or any psychosis. Anxiety: Yes Bipolar Disorder: No Depression: Yes Mania: No Psychosis: No Schizophrenia: No Personality Disorder: No Hospitalization for psychiatric illness: Yes History of Electroconvulsive Shock Therapy: No Prior Suicide Attempts: No  Review of Systems  Constitutional: Positive for malaise/fatigue.  Psychiatric/Behavioral: Positive for depression. Negative for suicidal ideas and substance abuse. The patient is nervous/anxious and has insomnia.   Constitutional:  BP 147/80 mmHg  Pulse 64  Ht 5\' 2"  (1.575 m)  Wt 164 lb 12.8 oz (74.753 kg)  BMI 30.13 kg/m2  Recent Results (from the  past 2160 hour(s))  Basic metabolic panel     Status: Abnormal   Collection Time: 10/02/14  2:11 PM  Result Value Ref Range   Sodium 139 135 - 145 mEq/L   Potassium 3.9 3.5 - 5.3 mEq/L   Chloride 108 96 - 112 mEq/L   CO2 21 19 - 32 mEq/L   Glucose, Bld 138 (H) 70 - 99 mg/dL   BUN 10 6 - 23 mg/dL   Creat 1.18 (H) 0.50 - 1.10 mg/dL   Calcium 9.9 8.4 - 10.5 mg/dL   Musculoskeletal: Strength & Muscle Tone: within normal limits Gait & Station: normal  Mental Status Examination;  Patient is casually dressed and fairly groomed.  She is tearful and described her mood sad and depressed.  Her affect is constricted.  She maintained good eye contact.  Her speech is clear and coherent.  Her thought processes logical linear and goal-directed.  She denies any auditory or visual hallucination.  She denies any active or passive suicidal thoughts or homicidal thoughts.  There were no delusion or psychotic symptoms present at this time.  There were no flight of idea or loose association.  Her fund of knowledge is adequate.  There no tremors or shakes present.  Her attention and concentration is fair.  She's alert and oriented x3.  Her insight judgment and impulse control is okay.   Review of Psycho-Social Stressors (1), Review or order clinical lab tests (1), Established Problem, Worsening (2), Review of Last Therapy Session (1), Review of Medication Regimen & Side Effects (2) and Review of New Medication or Change in Dosage (2)  Assessment: Axis I: Depressive disorder NOS  Axis II: Deferred  Axis III: See medical history  Plan:  I review her records, recent blood work and her current medication.  I will add low-dose Vistaril to help her anxiety and insomnia.  She's also taking Neurontin from her primary care physician.  I will continue Remeron 30 mg and Wellbutrin XL 300 mg daily.  Discussed medication side effects and benefits.  Recommended to keep appointment with Joaquim Lai for coping skills.   Recommended to call us back if she has any question or any concern.  Follow-up in 3 months. Time spent 25 minutes.  More than 50% of the time spent in psychoeducation, counseling and coordination of care.  Discuss safety plan that anytime having active suicidal thoughts or homicidal thoughts then patient need to call 911 or go to the local emergency room.    Dajuan Turnley T., MD 12/18/2014

## 2014-12-22 ENCOUNTER — Ambulatory Visit (HOSPITAL_COMMUNITY): Payer: Self-pay | Admitting: Clinical

## 2014-12-25 ENCOUNTER — Encounter: Payer: Self-pay | Admitting: Family Medicine

## 2014-12-28 ENCOUNTER — Ambulatory Visit: Payer: Self-pay | Admitting: Internal Medicine

## 2015-01-02 DIAGNOSIS — H5712 Ocular pain, left eye: Secondary | ICD-10-CM | POA: Diagnosis not present

## 2015-01-09 DIAGNOSIS — H5712 Ocular pain, left eye: Secondary | ICD-10-CM | POA: Diagnosis not present

## 2015-01-13 ENCOUNTER — Other Ambulatory Visit: Payer: Self-pay | Admitting: Family Medicine

## 2015-01-18 ENCOUNTER — Ambulatory Visit: Payer: Self-pay | Admitting: Family Medicine

## 2015-01-29 ENCOUNTER — Other Ambulatory Visit: Payer: Self-pay | Admitting: Internal Medicine

## 2015-01-31 ENCOUNTER — Ambulatory Visit (INDEPENDENT_AMBULATORY_CARE_PROVIDER_SITE_OTHER): Payer: Medicare Other | Admitting: Family Medicine

## 2015-01-31 ENCOUNTER — Encounter: Payer: Self-pay | Admitting: Family Medicine

## 2015-01-31 VITALS — BP 138/92 | HR 69 | Temp 98.6°F | Ht 62.0 in | Wt 168.0 lb

## 2015-01-31 DIAGNOSIS — F4321 Adjustment disorder with depressed mood: Secondary | ICD-10-CM | POA: Diagnosis not present

## 2015-01-31 DIAGNOSIS — J441 Chronic obstructive pulmonary disease with (acute) exacerbation: Secondary | ICD-10-CM | POA: Diagnosis not present

## 2015-01-31 MED ORDER — MONTELUKAST SODIUM 10 MG PO TABS: 10.0000 mg | ORAL_TABLET | Freq: Every day | ORAL | Status: DC

## 2015-01-31 MED ORDER — AZITHROMYCIN 250 MG PO TABS: ORAL_TABLET | ORAL | Status: DC

## 2015-01-31 MED ORDER — PREDNISONE 50 MG PO TABS: 50.0000 mg | ORAL_TABLET | Freq: Every day | ORAL | Status: DC

## 2015-01-31 NOTE — Patient Instructions (Signed)
I am so sorry for you loss.  I think that you may be having a COPD exacerbation.  I have sent in 2 medications for you to day to help with this.  If your symptoms do not get better or get worse come back for another appointment.  We will work on seeing if we can help you get into [facility].  Arianah Torgeson M. Lajuana Ripple, DO PGY-1, [facility]  Chronic Obstructive Pulmonary Disease Chronic obstructive pulmonary disease (COPD) is a common lung problem. In COPD, the flow of air from the lungs is limited. The way your lungs work will probably never return to normal, but there are things you can do to improve your lungs and make yourself feel better. HOME CARE  Take all medicines as told by your doctor.  Avoid medicines or cough syrups that dry up your airway (such as antihistamines) and do not allow you to get rid of thick spit. You do not need to avoid them if told differently by your doctor.  If you smoke, stop. Smoking makes the problem worse.  Avoid being around things that make your breathing worse (like smoke, chemicals, and fumes).  Use oxygen therapy and therapy to help improve your lungs (pulmonary rehabilitation) if told by your doctor. If you need home oxygen therapy, ask your doctor if you should buy a tool to measure your oxygen level (oximeter).  Avoid people who have a sickness you can catch (contagious).  Avoid going outside when it is very hot, cold, or humid.  Eat healthy foods. Eat smaller meals more often. Rest before meals.  Stay active, but remember to also rest.  Make sure to get all the shots (vaccines) your doctor recommends. Ask your doctor if you need a pneumonia shot.  Learn and use tips on how to relax.  Learn and use tips on how to control your breathing as told by your doctor. Try:  Breathing in (inhaling) through your nose for 1 second. Then, pucker your lips and breath out (exhale) through your lips for 2 seconds.  Putting one hand on your belly  (abdomen). Breathe in slowly through your nose for 1 second. Your hand on your belly should move out. Pucker your lips and breathe out slowly through your lips. Your hand on your belly should move in as you breathe out.  Learn and use controlled coughing to clear thick spit from your lungs. The steps are: 1. Lean your head a little forward. 2. Breathe in deeply. 3. Try to hold your breath for 3 seconds. 4. Keep your mouth slightly open while coughing 2 times. 5. Spit any thick spit out into a tissue. 6. Rest and do the steps again 1 or 2 times as needed. GET HELP IF:  You cough up more thick spit than usual.  There is a change in the color or thickness of the spit.  It is harder to breathe than usual.  Your breathing is faster than usual. GET HELP RIGHT AWAY IF:   You have shortness of breath while resting.  You have shortness of breath that stops you from:  Being able to talk.  Doing normal activities.  You chest hurts for longer than 5 minutes.  Your skin color is more blue than usual.  Your pulse oximeter shows that you have low oxygen for longer than 5 minutes. MAKE SURE YOU:   Understand these instructions.  Will watch your condition.  Will get help right away if you are not doing  well or get worse. Document Released: 03/31/2008 Document Revised: 02/27/2014 Document Reviewed: 06/09/2013 [facility] Patient Information 2015 [place], [place]. This information is not intended to replace advice given to you by your health care provider. Make sure you discuss any questions you have with your health care provider.

## 2015-01-31 NOTE — Progress Notes (Signed)
Patient ID: [name], female   DOB: [birth year], 64 y.o.   MRN: [id number]    Subjective: CC: chronic cough, situational depression HPI: Patient is a 64 y.o. female presenting to clinic today for office visit. Concerns today include:  1. Cough Patient reports continued cough.  She does report that since her sister's passing she has noticed that symptoms are similar to those she had last fall.  She reports productive cough >1 week.  She denies fevers, sick contacts.  On waiting list for new apartment.    2. Situational depression Patient reports that her sister passed away about 1 month ago.  She states that she is still struggling a lot with her passing.  She reports that she has little support from family as many of them live in [place] and "have forgotten about her sister now that she has passed".  She states that she has been sleeping a lot and finds it hard to get out of bed many days.  She reports decreased PO intake as she does not have an appetite.  She state that she saw her psychiatrist at the end of February and was started on Hydroxyzine to help with symptoms.  Already taking Wellbutrin and Remeron daily.  She does not feel that hydroxyzine helps a great deal.  She also states that she started seeing her therapist again.  She was scheduled to see the therapist recently but did not go because of decreased motivation.  Denies SI/HI.  Endorses feelings of sadness and poor motivation.  Social History Reviewed: active smoker. FamHx and MedHx updated.  Please see EMR. Health Maintenance: Tdap due  ROS: All other systems reviewed and are negative.  Objective: Office vital signs reviewed. BP 138/92 mmHg  Pulse 69  Temp(Src) 98.6 F (37 C) (Oral)  Ht 5\' 2"  (1.575 m)  Wt 168 lb (76.204 kg)  BMI 30.72 kg/m2  Physical Examination:  General: Awake, alert, tearful female HEENT: Normal    Neck: No masses palpated. No LAD    Eyes: EOMI    Nose: nasal turbinates moist  Throat: MMM, no erythema Cardio: RRR, S1S2 heard, no murmurs appreciated Pulm: globally decreased BS, no wheezes, rhonchi or rales Psych: mood sad, affect appropriate, normal thought, speech normal  PHQ-9: 18  Assessment: 64 y.o. female with cough likely 2/2 COPD exacerbation, situational depression on chronic depression  Plan: COPD exacerbation: -ZPAK, Pred 50mg  x5 days -Continue to use Symbicort daily and Albuterol PRN -Return precautions discussed  Situational on chronic depression: NO concern for SI/HI at this time. -Spent about 1/2 hour with patient providing emotional support in light of her recent loss -Encouraged patient to continue to see her Psychiatrist (next appt April or May 2016) -Encouraged patient to see her therapist again soon for continued therapy and emotional support -Discussed that patient can come and see me in office any time if she feels that she needs additional counseling/support -Continue Wellbutrin, Remeron and Hydroxyzine -Discussed stages of grief and Return precautions  -Patient to follow up if symptoms worsen  Total time spent with patient 30 minutes.  Greater than 50% of encounter spent in coordination of care/counseling.  Janora Norlander, DO PGY-1, [place]

## 2015-02-02 ENCOUNTER — Telehealth: Payer: Self-pay | Admitting: Clinical

## 2015-02-02 NOTE — Telephone Encounter (Signed)
CSW received a referral to assist pt in finding an apartment. CSW LVM for pt.  Hunt Oris, MSW, [place]

## 2015-02-12 ENCOUNTER — Other Ambulatory Visit (HOSPITAL_COMMUNITY): Payer: Self-pay | Admitting: Psychiatry

## 2015-02-12 NOTE — Telephone Encounter (Signed)
Vistaril 25 mg was called in today.

## 2015-02-12 NOTE — Telephone Encounter (Signed)
Patient started on Vistaril 12/18/14 with one time order.  Patient does not return until 03/20/15 and in need of a refill.

## 2015-02-14 ENCOUNTER — Other Ambulatory Visit: Payer: Self-pay | Admitting: Sports Medicine

## 2015-02-16 ENCOUNTER — Telehealth: Payer: Self-pay | Admitting: Family Medicine

## 2015-02-16 NOTE — Telephone Encounter (Signed)
2nd request.  Martin, Emily L, RN  

## 2015-02-16 NOTE — Telephone Encounter (Signed)
Discussed with patient use of acyclovir.  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  She reports she thinks she may be having an outbreak and is trying to get into see me.  Has an appointment with Dr Deniece Ree on 4/25 for follow up on this.  Vianny Schraeder M. Lajuana Ripple, DO PGY-1, [place]

## 2015-02-19 ENCOUNTER — Ambulatory Visit: Payer: Self-pay | Admitting: Family Medicine

## 2015-02-26 ENCOUNTER — Other Ambulatory Visit (HOSPITAL_COMMUNITY): Payer: Self-pay | Admitting: Psychiatry

## 2015-02-26 NOTE — Telephone Encounter (Signed)
Requested Wellbutrin order declined at this time due to being requested too early.  Patient had a new 90 day order e-scribed on 12/18/14.

## 2015-03-01 ENCOUNTER — Other Ambulatory Visit: Payer: Self-pay | Admitting: Internal Medicine

## 2015-03-13 ENCOUNTER — Ambulatory Visit (INDEPENDENT_AMBULATORY_CARE_PROVIDER_SITE_OTHER): Payer: Medicare Other | Admitting: Family Medicine

## 2015-03-13 ENCOUNTER — Encounter: Payer: Self-pay | Admitting: Family Medicine

## 2015-03-13 VITALS — BP 163/77 | HR 71 | Wt 168.9 lb

## 2015-03-13 DIAGNOSIS — I10 Essential (primary) hypertension: Secondary | ICD-10-CM | POA: Diagnosis not present

## 2015-03-13 DIAGNOSIS — R0683 Snoring: Secondary | ICD-10-CM

## 2015-03-13 DIAGNOSIS — N898 Other specified noninflammatory disorders of vagina: Secondary | ICD-10-CM

## 2015-03-13 DIAGNOSIS — R0689 Other abnormalities of breathing: Secondary | ICD-10-CM | POA: Diagnosis not present

## 2015-03-13 LAB — POCT WET PREP (WET MOUNT): CLUE CELLS WET PREP WHIFF POC: NEGATIVE

## 2015-03-13 NOTE — Assessment & Plan Note (Signed)
BP elevated today.  Concern for OSA -Continue clonidine  -Sleep study ordered

## 2015-03-13 NOTE — Progress Notes (Signed)
Patient ID: [name], female   DOB: May 20, 1951, 64 y.o.   MRN: [id number]    Subjective: CC: vaginal concerns HPI: Patient is a 64 y.o. female presenting to clinic today for office visit. Concerns today include:  1. Vaginal concerns Patient reports smelly vaginal discharge and burning in her vaginal area x3 weeks.  She also states that she noticed bump on the inside of her vagina about 3 days ago.  She has been using vagisil daily with little relief.  She also reports that she has a h/o HSV2 and has been taking Acyclovir 200mg  5x daily x3 weeks.  She states this is not improving symptoms either.  She denies sexual activity.    2. HTN Patient reports that she is compliant with her BP medication and takes it each evening.  Denies CP, SOB but does endorse an occasional headache.  She reports snoring, coughing and gasping for air at night.  She also reports decreased energy (but notes that this is since her sister's passing).  She has never been diagnosed with OSA.  3. Depression Patient reports that she has appt with Dr Adele Schilder for [place].  She states that she continues to feel depressed in spite of medication and continued counseling.  She does note that she spent time with her children on Saturday and Sunday.  She reports that this went well.  Denies SI/HI.  Social History Reviewed: current smoker. FamHx and MedHx updated.  Please see EMR.  ROS: All other systems reviewed and are negative.  Objective: Office vital signs reviewed. BP 163/77 mmHg  Pulse 71  Wt 168 lb 14.4 oz (76.613 kg)  SpO2 99%  Physical Examination:  General: Awake, alert, anxious appearing female, NAD GU: 0.5cm round mass appreciated on the R inner labia just inferior to the apex, mass is nonfluctant/nonindurated, lesion is flesh colored and mildly TTP, non bleeding and nonexudative, non pedunculated, and non ulcerative, scant white vaginal discharge appreciated from os, internal vaginal mucosa normal  appearing Extremities: WWP, No edema, cyanosis or clubbing; +2 pulses bilaterally MSK: Normal gait and station Psych: PHQ-9 score: 11  Results for orders placed or performed in visit on 03/13/15 (from the past 24 hour(s))  POCT Wet Prep [facility] [place])     Status: None   Collection Time: 03/13/15  2:56 PM  Result Value Ref Range   Source Wet Prep POC VAG    WBC, Wet Prep HPF POC 0-3    Bacteria Wet Prep HPF POC 3+ RODS    Clue Cells Wet Prep HPF POC None    Clue Cells Wet Prep Whiff POC Negative Whiff    Yeast Wet Prep HPF POC None    Trichomonas Wet Prep HPF POC NONE    Assessment: 64 y.o. female with vaginal discharge and a vestibular cyst  Plan: Vaginal discharge: Wet prep with no evidence of infection  Vaginal vestibular cyst: -Encouraged warm compresses to affected area TID -Tylenol PRN discomfort -STOP Acyclovir for now, as there is no evidence of herpes outbreak -Consider referral to Gyn if continued discomfort  HTN: ?OSA component -Continue clonidine -Will order sleep study   Janora Norlander, DO PGY-1, [place]

## 2015-03-13 NOTE — Patient Instructions (Addendum)
I think you have a vestibular cyst.  You can take Tylenol for pain and apply warm compresses to the area.  If this is not getting better in 10 days, I want you to call the office and let me know.  We will schedule an appointment with gynecology at that time for you.  Stop taking the acyclovir.  This is not a herpes outbreak.  Emily Dziuba M. Lajuana Ripple, DO PGY-1, [place]

## 2015-03-14 NOTE — Progress Notes (Signed)
I was the preceptor on the day of this visit.   Margaruite Top MD  

## 2015-03-19 ENCOUNTER — Other Ambulatory Visit (HOSPITAL_COMMUNITY): Payer: Self-pay | Admitting: Psychiatry

## 2015-03-20 ENCOUNTER — Encounter (HOSPITAL_COMMUNITY): Payer: Self-pay | Admitting: Psychiatry

## 2015-03-20 ENCOUNTER — Ambulatory Visit (INDEPENDENT_AMBULATORY_CARE_PROVIDER_SITE_OTHER): Payer: Medicare Other | Admitting: Psychiatry

## 2015-03-20 VITALS — BP 157/83 | HR 76 | Ht 62.0 in | Wt 170.4 lb

## 2015-03-20 DIAGNOSIS — F330 Major depressive disorder, recurrent, mild: Secondary | ICD-10-CM

## 2015-03-20 DIAGNOSIS — F329 Major depressive disorder, single episode, unspecified: Secondary | ICD-10-CM

## 2015-03-20 DIAGNOSIS — F32A Depression, unspecified: Secondary | ICD-10-CM

## 2015-03-20 MED ORDER — BUPROPION HCL ER (XL) 300 MG PO TB24: 300.0000 mg | ORAL_TABLET | Freq: Every day | ORAL | Status: DC

## 2015-03-20 MED ORDER — HYDROXYZINE PAMOATE 25 MG PO CAPS: 25.0000 mg | ORAL_CAPSULE | Freq: Every day | ORAL | Status: DC

## 2015-03-20 MED ORDER — MIRTAZAPINE 30 MG PO TABS: 30.0000 mg | ORAL_TABLET | Freq: Every day | ORAL | Status: DC

## 2015-03-20 NOTE — Psych (Signed)
BH MD/PA/NP OP Progress Note  03/20/2015 2:38 PM [name]  MRN:  [id number]  Subjective:  I still anxious but am sleeping better. Chief Complaint:  Chief Complaint    Follow-up; Medication Refill     Visit Diagnosis:     ICD-9-CM ICD-10-CM   1. Major depressive disorder, recurrent episode, mild 296.31 F33.0 mirtazapine (REMERON) 30 MG tablet     hydrOXYzine (VISTARIL) 25 MG capsule     buPROPion (WELLBUTRIN XL) 300 MG 24 hr tablet  2. Depression 311 F32.9     Past Medical History:  Past Medical History  Diagnosis Date  . Hx of migraines   . Depression   . Hyperlipidemia   . Hypertension   . Urinary incontinence   . Back pain with radiation     radiopathy   . Nicotine addiction   . Asthma   . Carpal tunnel syndrome 10/27/2010  . PANIC ATTACK 11/05/2007  . PELVIC  PAIN 08/13/2010  . Corneal abrasion, left 04/28/2013  . Adenomatous colon polyp   . COPD (chronic obstructive pulmonary disease)   . Anxiety   . GERD (gastroesophageal reflux disease)     Past Surgical History  Procedure Laterality Date  . Cholecystectomy  1999  . Total abdominal hysterectomy w/ bilateral salpingoophorectomy  1989  . Hiatal hernia repair  1967  . Removal of mild ducts from left breast  1982  . Bleeding ulcer repair  1992    Open Ex Lap with perforation of duodenal ulcer with pertitonitis  . Left ankle surgery  2008  . Double mastectomy  June 04, 2010  . Tubal ligation  1975, 1978    x 2   Family History:  Family History  Problem Relation Age of Onset  . Heart disease Mother   . Diabetes Mother   . Lupus Mother   . Colon polyps Mother   . Tuberculosis Father   . Diabetes Brother   . Depression Sister   . Colon polyps Maternal Aunt   . Heart disease Maternal Grandmother   . Heart attack Maternal Aunt   . Stomach cancer Sister   . Colon cancer Neg Hx   . Esophageal cancer Neg Hx   . Pancreatic cancer Neg Hx   . Rectal cancer Neg Hx    Social History:  History    Social History  . Marital Status: Legally Separated    Spouse Name: N/A  . Number of Children: 3  . Years of Education: N/A   Occupational History  . unemployed     Social History Main Topics  . Smoking status: Current Every Day Smoker -- 0.25 packs/day for 42 years    Types: Cigarettes  . Smokeless tobacco: Never Used     Comment: working on quitting  . Alcohol Use: No  . Drug Use: No  . Sexual Activity: No   Other Topics Concern  . None   Social History Narrative   Additional History: Patient came for her follow-up appointment.  We started her on Vistaril on her last visit.  She is taking every day which is helping her sleep and anxiety symptoms.  She still have nervousness and she has noticed since her sister died few months ago she is more nervous and anxious.  We have recommended counseling but she did not come to the appointment.  She told she went to [facility] for 2 months to change the scene and it did help.  However she is more open to see counselor again.  She denies any side effects of medication.  She denies any crying spells or any irritability.  She has a lot of health issues and she is seeing multiple medication.  She lives by herself.  She feels sometimes very lonely cause her children's are not around.  Patient denies drinking or using any illegal substances.  Her appetite is okay.  Her vitals are stable.  Assessment:   Musculoskeletal: Strength & Muscle Tone: within normal limits Gait & Station: normal Patient leans: N/A  Psychiatric Specialty Exam: HPI  Review of Systems  Constitutional: Positive for malaise/fatigue.  Cardiovascular: Negative for chest pain and palpitations.  Skin: Negative for itching and rash.  Neurological: Negative for tremors.    Blood pressure 157/83, pulse 76, height 5\' 2"  (1.575 m), weight 170 lb 6.4 oz (77.293 kg).Body mass index is 31.16 kg/(m^2).  General Appearance: Casual  Eye Contact:  Fair  Speech:  Normal Rate  Volume:   Normal  Mood:  Anxious  Affect:  Constricted and Depressed  Thought Process:  Linear  Orientation:  Full (Time, Place, and Person)  Thought Content:  Rumination  Suicidal Thoughts:  No  Homicidal Thoughts:  No  Memory:  Immediate;   Fair Recent;   Fair Remote;   Fair  Judgement:  Good  Insight:  Good  Psychomotor Activity:  Normal  Concentration:  Good  Recall:  [place] of Knowledge: Fair  Language: Fair  Akathisia:  No  Handed:  Right  AIMS (if indicated):  Not reported  Assets:  Communication Skills Desire for Improvement Housing Transportation  ADL's:  Intact  Cognition: WNL  Sleep:  good   Is the patient at risk to self?  No. Has the patient been a risk to self in the past 6 months?  No. Has the patient been a risk to self within the distant past?  No. Is the patient a risk to others?  No. Has the patient been a risk to others in the past 6 months?  No. Has the patient been a risk to others within the distant past?  No.  Current Medications: Current Outpatient Prescriptions  Medication Sig Dispense Refill  . acyclovir (ZOVIRAX) 200 MG capsule TAKE 1 CAPSULE BY MOUTH FIVE TIMES DAILY 450 capsule 0  . albuterol (PROAIR HFA) 108 (90 BASE) MCG/ACT inhaler Inhale 2 puffs into the lungs as directed. 1 Inhaler 3  . atorvastatin (LIPITOR) 40 MG tablet TAKE 1 TABLET BY MOUTH EVERY DAY 30 tablet 2  . budesonide-formoterol (SYMBICORT) 80-4.5 MCG/ACT inhaler Inhale 2 puffs into the lungs 2 (two) times daily. 1 Inhaler 3  . buPROPion (WELLBUTRIN XL) 300 MG 24 hr tablet Take 1 tablet (300 mg total) by mouth daily. 90 tablet 0  . cetirizine (ZYRTEC) 10 MG tablet Take 1 tablet (10 mg total) by mouth daily. 30 tablet 11  . Cholecalciferol (VITAMIN D3) 50000 UNITS CAPS Take 1 capsule by mouth once a week. 12 capsule 0  . cloNIDine (CATAPRES) 0.3 MG tablet TAKE 1&1/2 TABLETS BY MOUTH AT BEDTIME 45 tablet 0  . fluticasone (FLONASE) 50 MCG/ACT nasal spray Place 2 sprays into both  nostrils daily. 16 g 6  . gabapentin (NEURONTIN) 100 MG capsule TAKE ONE CAPSULE BY MOUTH THREE TIMES DAILY 90 capsule 0  . hydrOXYzine (VISTARIL) 25 MG capsule Take 1 capsule (25 mg total) by mouth at bedtime. 90 capsule 0  . LINZESS 145 MCG CAPS capsule TAKE 1 CAPSULE DAILY. 30 capsule 3  . lisinopril-hydrochlorothiazide (PRINZIDE,ZESTORETIC) 10-12.5 MG  per tablet TAKE 1 TABLET BY MOUTH DAILY 30 tablet 0  . mirtazapine (REMERON) 30 MG tablet Take 1 tablet (30 mg total) by mouth at bedtime. 90 tablet 0  . montelukast (SINGULAIR) 10 MG tablet Take 1 tablet (10 mg total) by mouth at bedtime. 30 tablet 3  . Olopatadine HCl 0.2 % SOLN Apply 1 drop to eye daily. 2.5 mL 0  . potassium chloride SA (K-DUR,KLOR-CON) 20 MEQ tablet TAKE 1 TABLET BY MOUTH DAILY 30 tablet 0  . predniSONE (DELTASONE) 50 MG tablet Take 1 tablet (50 mg total) by mouth daily with breakfast. x5 days 5 tablet 0  . ranitidine (ZANTAC) 150 MG tablet TAKE 1 TABLET BY MOUTH TWICE DAILY 60 tablet 2  . Spacer/Aero-Holding Chambers (AEROCHAMBER PLUS FLO-VU LARGE) MISC 1 each by Other route once. 1 each 0  . topiramate (TOPAMAX) 25 MG tablet TAKE 1 TABLET BY MOUTH TWO TIMES DAILY 60 tablet 5  . Vitamin D, Ergocalciferol, (DRISDOL) 50000 UNITS CAPS capsule TAKE 1 CAPSULE BY MOUTH ONCE A WEEK 12 capsule 0   No current facility-administered medications for this visit.    Medical Decision Making:  Established Problem, Stable/Improving (1), Review of Psycho-Social Stressors (1), Review of Last Therapy Session (1) and Review of Medication Regimen & Side Effects (2)  Treatment Plan Summary; Patient is fairly stable on her current medication.  I recommended to take Vistaril every night to help her sleep and anxiety.  I will continue Remeron 30 mg daily and Wellbutrin XL 300 mg daily.  Discussed medication side effects and benefits.  We will schedule appointment with Joaquim Lai for coping skills.  Recommended to call us back if she has any question  or any concern.  Discussed safety concerns that anytime having suicidal thoughts or homicidal thoughts and she need to call 911 or go to the local emergency room.  Follow-up in 3 months.   Quill Grinder T. 03/20/2015, 2:38 PM

## 2015-03-24 ENCOUNTER — Other Ambulatory Visit: Payer: Self-pay | Admitting: Family Medicine

## 2015-03-30 ENCOUNTER — Other Ambulatory Visit: Payer: Self-pay | Admitting: Family Medicine

## 2015-03-30 MED ORDER — MONTELUKAST SODIUM 10 MG PO TABS: 10.0000 mg | ORAL_TABLET | Freq: Every day | ORAL | Status: DC

## 2015-03-30 NOTE — Telephone Encounter (Signed)
Request for 90 day supply. Martin, Tamika L, RN  

## 2015-04-02 ENCOUNTER — Other Ambulatory Visit: Payer: Self-pay | Admitting: *Deleted

## 2015-04-02 NOTE — Telephone Encounter (Signed)
Pt is requesting 90 day supply. [name]  

## 2015-04-03 MED ORDER — ATORVASTATIN CALCIUM 40 MG PO TABS: 40.0000 mg | ORAL_TABLET | Freq: Every day | ORAL | Status: DC

## 2015-04-18 ENCOUNTER — Ambulatory Visit (HOSPITAL_COMMUNITY): Payer: Self-pay | Admitting: Clinical

## 2015-04-20 IMAGING — CT CT CHEST W/ CM
2 of 4 series · 15 of 36 positions shown, 18 images · IV contrast (omnipaque)
Comparison: 04/23/2014

CLINICAL DATA: Dry cough. Tobacco use. Tobacco use. Possible mold
exposure.

EXAM:
CT CHEST WITH CONTRAST
TECHNIQUE: Multidetector CT imaging of the chest was performed during
intravenous contrast administration.
CONTRAST:  80mL OMNIPAQUE IOHEXOL 300 MG/ML  SOLN

[Series 2: thorax 5.0 i31f 1 · axial · 0.78mm/px · z∈[-253,-8]mm · 12 of 59 slices shown, 15 images]
[im 5/59  mediastinal]
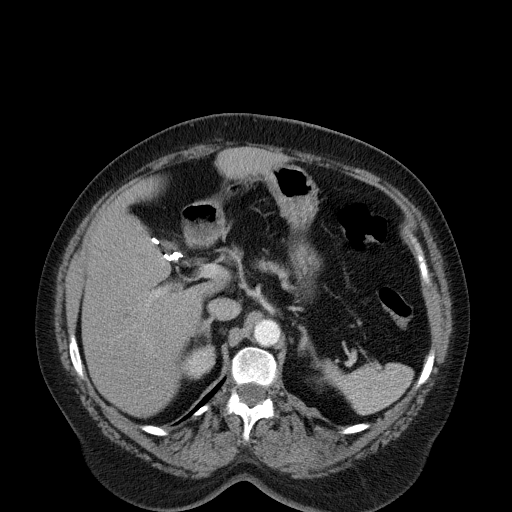
[im 5/59  lung]
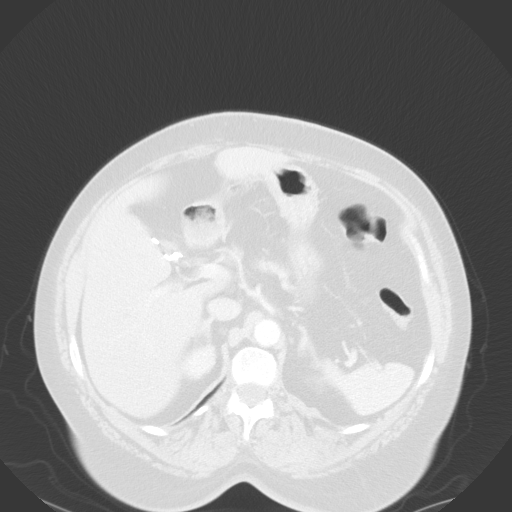
[im 9/59  lung]
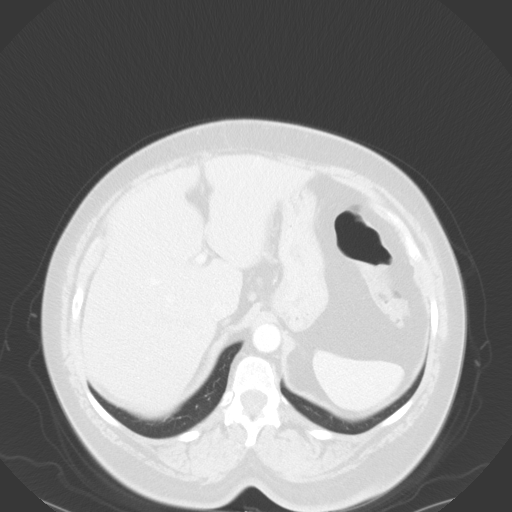
[im 13/59  lung]
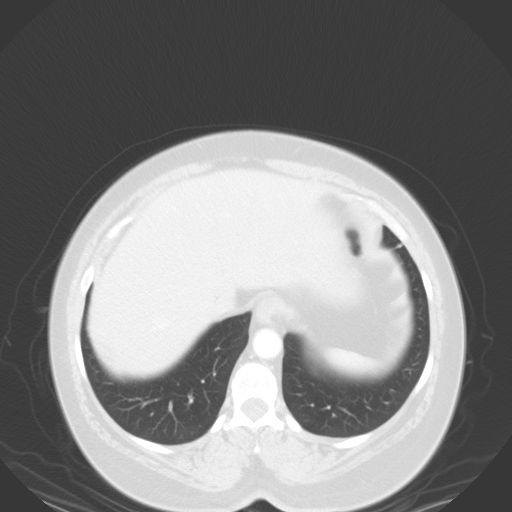
[im 17/59  lung]
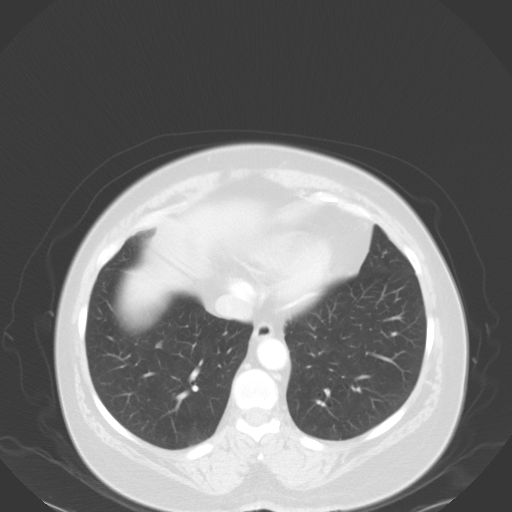
[im 21/59  mediastinal]
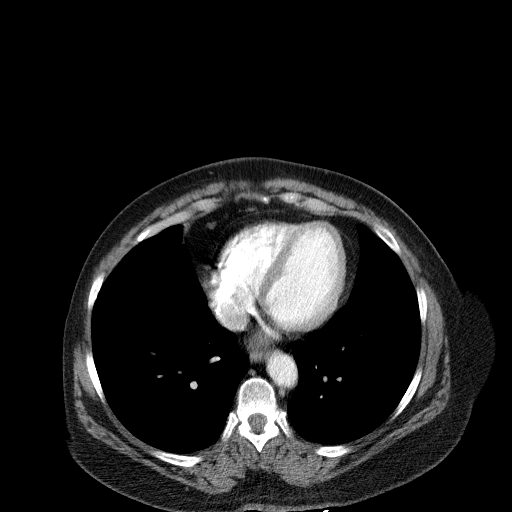
[im 21/59  lung]
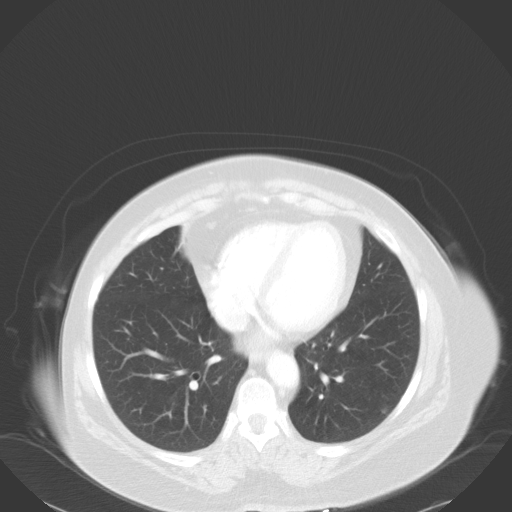
[im 25/59  lung]
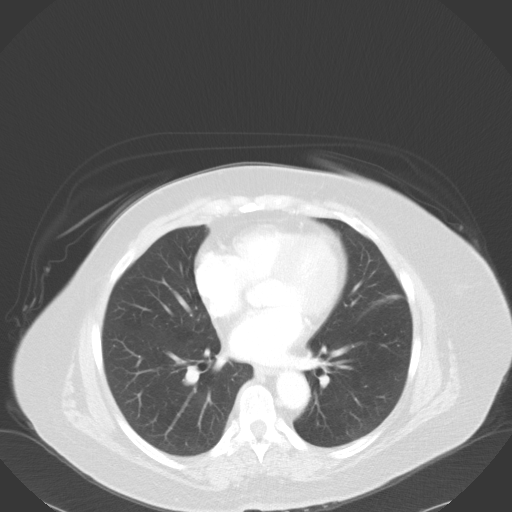
[im 34/59  lung]
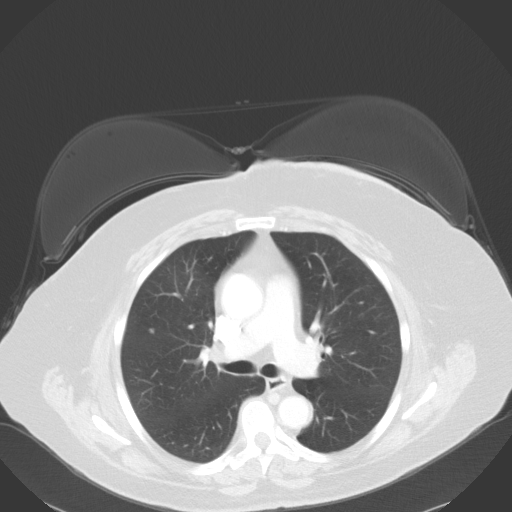
[im 38/59  lung]
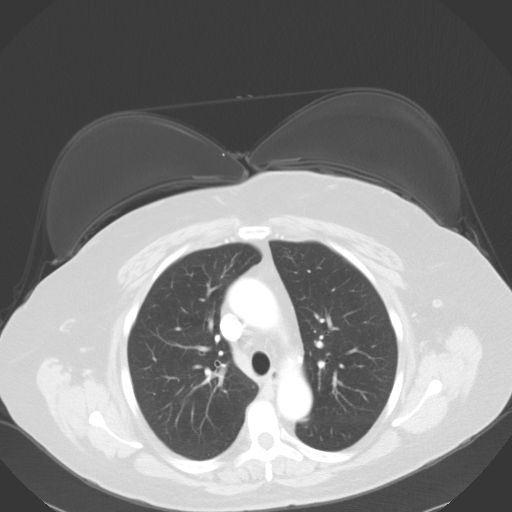
[im 42/59  mediastinal]
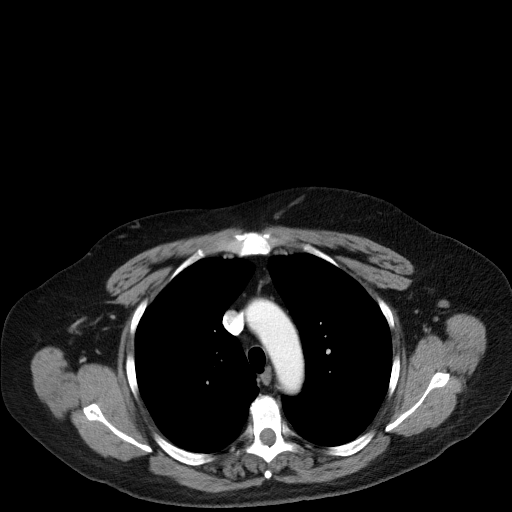
[im 42/59  lung]
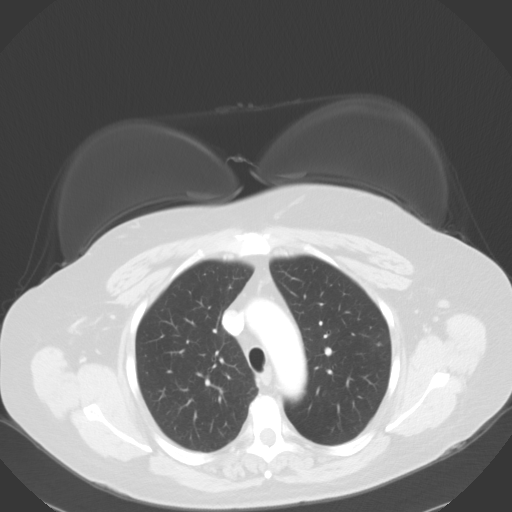
[im 46/59  lung]
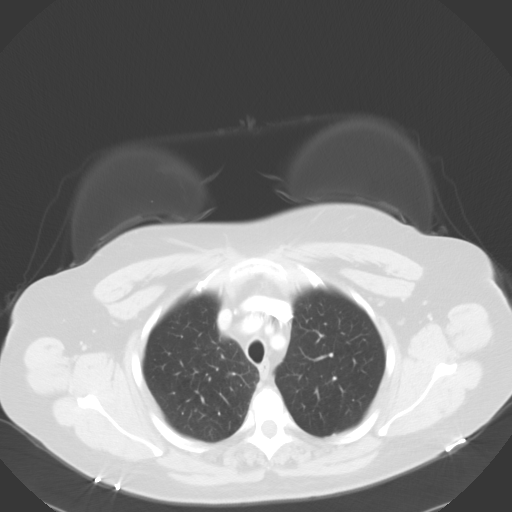
[im 50/59  lung]
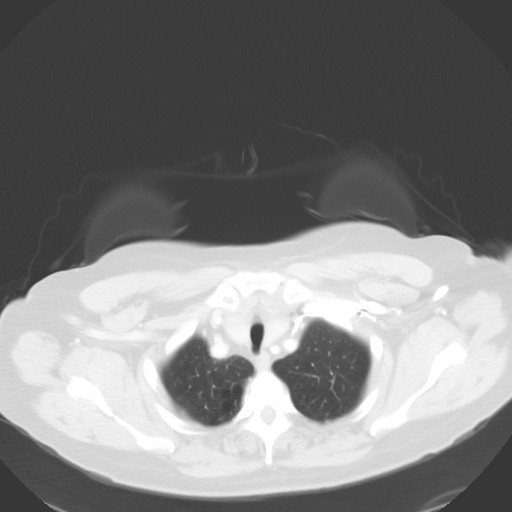
[im 54/59  lung]
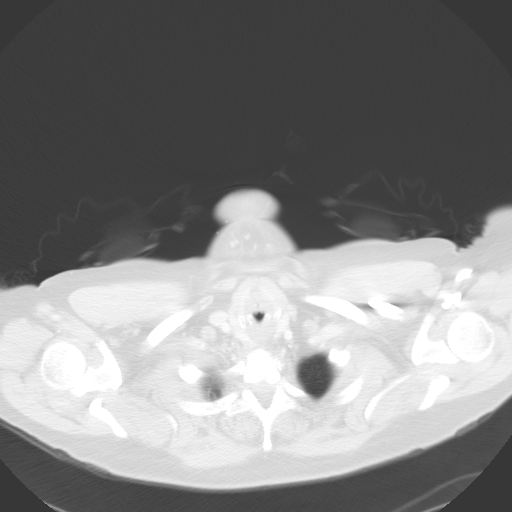

[Series 5: coronal · coronal · 0.66mm/px · 3 of 87 slices shown]
[im 18/87  lung]
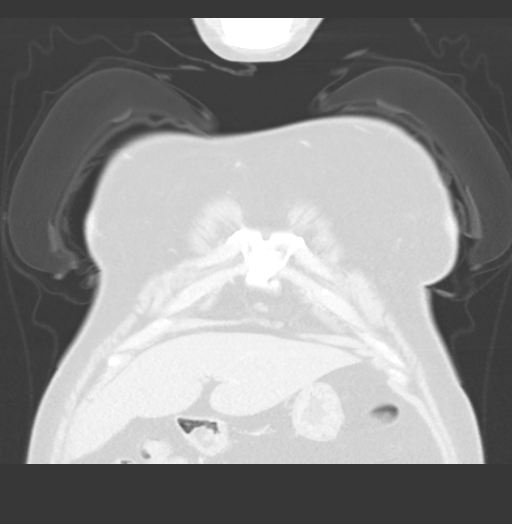
[im 35/87  lung]
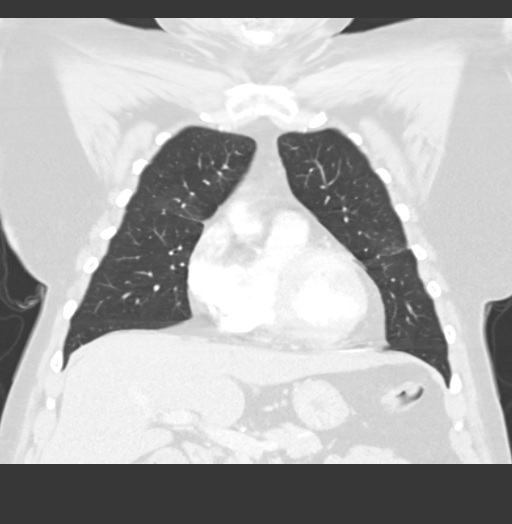
[im 52/87  lung]
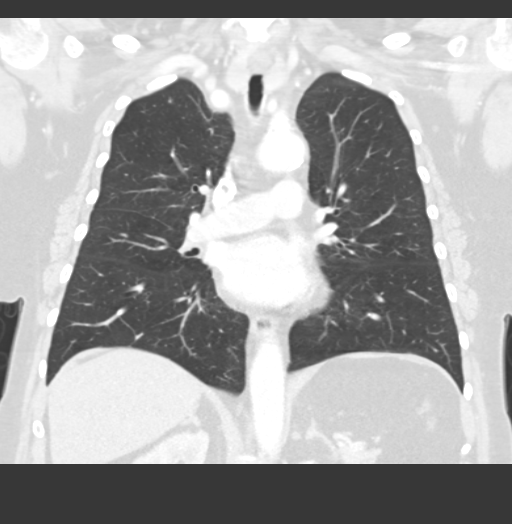

[15 of 36 positions shown; findings below may reference images not displayed]

FINDINGS: Mild atherosclerotic calcification of the aortic arch and adjacent
branch vasculature. Prior cholecystectomy.

No pathologic thoracic adenopathy.  No pleural effusion.

Mild emphysema noted at the lung apices, more per should along the
right.

Multiple basilar small pulmonary nodules are stable from 02/01/2012.
These are as follows:

4 mm subpleural lymph node along the minor fissure, image 23 series
3. 3 mm pulmonary nodule in the right middle lobe, image 23 series
6. Flat 3 mm pulmonary nodule in the right lower lobe, image 24
series 6. Flat peribronchovascular 4 mm left lower lobe nodule,
image 36 series [DATE] by 4 by 3 mm pulmonary nodule in the left lower
lobe, image 33 series 3.

Upper thoracic spondylosis noted with some degenerative endplate
sclerosis. No malalignment or fracture.
IMPRESSION: 1. Several bilateral pulmonary nodules in the 3-4 mm range, stable
from 9683 and (where included) from 5555, accordingly considered
benign.
2. Mild emphysema.
3. Mild atherosclerosis.
4. Upper thoracic spondylosis.

## 2015-05-14 ENCOUNTER — Ambulatory Visit (INDEPENDENT_AMBULATORY_CARE_PROVIDER_SITE_OTHER): Payer: Medicare Other | Admitting: Clinical

## 2015-05-14 DIAGNOSIS — F331 Major depressive disorder, recurrent, moderate: Secondary | ICD-10-CM | POA: Diagnosis not present

## 2015-05-14 DIAGNOSIS — F4321 Adjustment disorder with depressed mood: Secondary | ICD-10-CM

## 2015-05-15 DIAGNOSIS — H10013 Acute follicular conjunctivitis, bilateral: Secondary | ICD-10-CM | POA: Diagnosis not present

## 2015-05-16 ENCOUNTER — Encounter (HOSPITAL_COMMUNITY): Payer: Self-pay | Admitting: Clinical

## 2015-05-16 NOTE — Progress Notes (Signed)
   THERAPIST PROGRESS NOTE  Session Time: 1:30 - 2:28  Participation Level: Active  Behavioral Response: CasualAlertDepressed  Type of Therapy: Individual Therapy  Treatment Goals addressed: improve psychiatric symptoms  Interventions: Cbt, Motivational Interviewing,   Summary: [name] is a 64 y.o. female who presents with   Suicidal/Homicidal: No without intent/plan  Therapist Response: [name] met with clinician for an individual session. She discussed her current life events and her psychiatric symptoms. [name] and clinician reviewed and updated her assessment. [name] had come for an assessment in February and had not come to her follow up appointment. She said "At that time I just didn't feel like talking about it." She reports that she might be ready now. [name] reported that she felt like her medication was working better and she was a little less depressed. In my office her affect and mood were depressed, and she was tearful when she spoke about her relationship with her children indicating that she could benefit from counseling in addition to her medication. [name] shared that she had a positive experience visiting with her oldest grand daughter who is college age. She shared that she enjoyed her company and they're relationship. [name] shared that she is alone a majority of time. She shared that she would like to be able to be happy and sometimes that seems like it is impossible.  Clinician guided [name] through a grounding technique. Client and clinician agreed to talk more about grounding and other techniques to improve [name]'s mood at next session.  Plan: Return again in 2 weeks.  Diagnosis:     Axis I:    [name] A, LCSW 05/16/2015

## 2015-05-17 ENCOUNTER — Ambulatory Visit (INDEPENDENT_AMBULATORY_CARE_PROVIDER_SITE_OTHER): Payer: Medicare Other | Admitting: Family Medicine

## 2015-05-17 ENCOUNTER — Encounter: Payer: Self-pay | Admitting: Family Medicine

## 2015-05-17 VITALS — BP 158/85 | HR 71 | Temp 98.3°F | Ht 60.0 in | Wt 169.5 lb

## 2015-05-17 DIAGNOSIS — R05 Cough: Secondary | ICD-10-CM

## 2015-05-17 DIAGNOSIS — E782 Mixed hyperlipidemia: Secondary | ICD-10-CM | POA: Diagnosis not present

## 2015-05-17 DIAGNOSIS — N898 Other specified noninflammatory disorders of vagina: Secondary | ICD-10-CM | POA: Diagnosis not present

## 2015-05-17 DIAGNOSIS — L298 Other pruritus: Secondary | ICD-10-CM

## 2015-05-17 DIAGNOSIS — R053 Chronic cough: Secondary | ICD-10-CM

## 2015-05-17 HISTORY — DX: Other specified noninflammatory disorders of vagina: N89.8

## 2015-05-17 LAB — POCT WET PREP (WET MOUNT): CLUE CELLS WET PREP WHIFF POC: NEGATIVE

## 2015-05-17 MED ORDER — CLOBETASOL PROPIONATE 0.05 % EX OINT: TOPICAL_OINTMENT | CUTANEOUS | Status: DC

## 2015-05-17 MED ORDER — METRONIDAZOLE 0.75 % VA GEL: 1.0000 | Freq: Two times a day (BID) | VAGINAL | Status: DC
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
## 2015-05-17 NOTE — Patient Instructions (Addendum)
I have sent in a cream for you to use vaginally.  Plan to follow up with me if symptoms do not improve.  Ashly M. Lajuana Ripple, DO PGY-2, [facility]    Monilial Vaginitis Vaginitis in a soreness, swelling and redness (inflammation) of the vagina and vulva. Monilial vaginitis is not a sexually transmitted infection. CAUSES  Yeast vaginitis is caused by yeast (candida) that is normally found in your vagina. With a yeast infection, the candida has overgrown in number to a point that upsets the chemical balance. SYMPTOMS   White, thick vaginal discharge.  Swelling, itching, redness and irritation of the vagina and possibly the lips of the vagina (vulva).  Burning or painful urination.  Painful intercourse. DIAGNOSIS  Things that may contribute to monilial vaginitis are:  Postmenopausal and virginal states.  Pregnancy.  Infections.  Being tired, sick or stressed, especially if you had monilial vaginitis in the past.  Diabetes. Good control will help lower the chance.  Birth control pills.  Tight fitting garments.  Using bubble bath, feminine sprays, douches or deodorant tampons.  Taking certain medications that kill germs (antibiotics).  Sporadic recurrence can occur if you become ill. TREATMENT  Your caregiver will give you medication.  There are several kinds of anti monilial vaginal creams and suppositories specific for monilial vaginitis. For recurrent yeast infections, use a suppository or cream in the vagina 2 times a week, or as directed.  Anti-monilial or steroid cream for the itching or irritation of the vulva may also be used. Get your caregiver's permission.  Painting the vagina with methylene blue solution may help if the monilial cream does not work.  Eating yogurt may help prevent monilial vaginitis. HOME CARE INSTRUCTIONS   Finish all medication as prescribed.  Do not have sex until treatment is completed or after your caregiver tells you it  is okay.  Take warm sitz baths.  Do not douche.  Do not use tampons, especially scented ones.  Wear cotton underwear.  Avoid tight pants and panty hose.  Tell your sexual partner that you have a yeast infection. They should go to their caregiver if they have symptoms such as mild rash or itching.  Your sexual partner should be treated as well if your infection is difficult to eliminate.  Practice safer sex. Use condoms.  Some vaginal medications cause latex condoms to fail. Vaginal medications that harm condoms are:  Cleocin cream.  Butoconazole (Femstat).  Terconazole (Terazol) vaginal suppository.  Miconazole (Monistat) (may be purchased over the counter). SEEK MEDICAL CARE IF:   You have a temperature by mouth above 102 F (38.9 C).  The infection is getting worse after 2 days of treatment.  The infection is not getting better after 3 days of treatment.  You develop blisters in or around your vagina.  You develop vaginal bleeding, and it is not your menstrual period.  You have pain when you urinate.  You develop intestinal problems.  You have pain with sexual intercourse. Document Released: 07/23/2005 Document Revised: 01/05/2012 Document Reviewed: 04/06/2009 [facility] Patient Information 2015 [place], [place]. This information is not intended to replace advice given to you by your health care provider. Make sure you discuss any questions you have with your health care provider.

## 2015-05-17 NOTE — Progress Notes (Signed)
Patient ID: [name], female   DOB: May 18, 1951, 64 y.o.   MRN: [id number]    Subjective: CC: COPD/Vaginal discharge HPI: Patient is a 65 y.o. female presenting to clinic today for office visit. Concerns today include:  1. COPD Smoking down to 6/day from 1 pack ppd.  She reports breathing is better since decreasing cigs and stopping smoking in apartment.  No chills, fever, weight loss.  2. Vaginal discharge Patient reports that she is having an intermittent burning, itching sensation in her vaginal area.  She has started drinking more water since symptoms started.  She reports that symptoms are worse after a hot bath.    Social History Reviewed: current smoker. FamHx and MedHx updated.  Please see EMR. Health Maintenance: TDap today  ROS: All other systems reviewed and are negative.  Objective: Office vital signs reviewed. BP 158/85 mmHg  Pulse 71  Temp(Src) 98.3 F (36.8 C) (Oral)  Ht 5' (1.524 m)  Wt 169 lb 8 oz (76.885 kg)  BMI 33.10 kg/m2  Physical Examination:  General: Awake, alert, well nourished, NAD HEENT: Normal, EOMI Cardio: RRR, S1S2 heard, no murmurs appreciated Pulm: normal work of breathing GU: copious white cheesy discharge appreciated, no cervical motion TTP, vaginal mucosa is atrophic, no adnexal masses appreciated  Extremities: WWP, No edema, cyanosis or clubbing; +2 pulses bilaterally MSK: Normal gait and station  Results for orders placed or performed in visit on 05/17/15 (from the past 24 hour(s))  POCT Wet Prep [facility] [place])     Status: Abnormal   Collection Time: 05/17/15  3:41 PM  Result Value Ref Range   Source Wet Prep POC VAG    WBC, Wet Prep HPF POC 5-10    Bacteria Wet Prep HPF POC Many (A) None, Few   Clue Cells Wet Prep HPF POC None None   Clue Cells Wet Prep Whiff POC Negative Whiff    Yeast Wet Prep HPF POC Few    Trichomonas Wet Prep HPF POC NONE    Assessment: 64 y.o. female with COPD and yeast infection  Plan: See  Problem List and After Visit Summary   Janora Norlander, DO PGY-2, [facility]

## 2015-05-17 NOTE — Assessment & Plan Note (Addendum)
Improving.  Discussed case with Constance Holster, who gave patient a list of alternative apartment complexes. -Smoking cessation discussed -Continue inhalers as directed -Patient to follow up with Dr Valentina Lucks for continued smoking cessation counseling.

## 2015-05-17 NOTE — Assessment & Plan Note (Signed)
Wet prep consistent with yeast infection.  HPI makes me suspicious of lichen sclerosis of vagina as well. -Metrogel per vagina -Will consider temovate cream if continued vaginal irritation after completion of yeast medication -Return if no improvement in symptoms

## 2015-05-18 ENCOUNTER — Encounter: Payer: Self-pay | Admitting: Family Medicine

## 2015-05-18 LAB — LIPID PANEL
CHOL/HDL RATIO: 5.5 ratio
CHOLESTEROL: 193 mg/dL (ref 0–200)
HDL: 35 mg/dL — ABNORMAL LOW (ref >=46)
LDL Cholesterol: 107 mg/dL — ABNORMAL HIGH (ref 0–99)
Triglycerides: 253 mg/dL — ABNORMAL HIGH (ref <150)
VLDL: 51 mg/dL — AB (ref 0–40)

## 2015-05-26 ENCOUNTER — Telehealth: Payer: Self-pay | Admitting: Family Medicine

## 2015-05-26 MED ORDER — FLUCONAZOLE 150 MG PO TABS: 150.0000 mg | ORAL_TABLET | Freq: Once | ORAL | Status: DC

## 2015-05-26 NOTE — Telephone Encounter (Signed)
Emergency line   Has been using Metrogel x 3 days. Having worsening pain/burning and stopped this medication. No fevers of dysuria.  Most recent wet prep with yeast.   Will prescribe difulcan 150mg  once. If no improvement advised to f/u with PCP, as there were other things on the DDx.  Archie Patten, MD [facility] Resident  05/26/2015, 12:29 PM

## 2015-05-31 ENCOUNTER — Ambulatory Visit (INDEPENDENT_AMBULATORY_CARE_PROVIDER_SITE_OTHER): Payer: Medicare Other | Admitting: Family Medicine

## 2015-05-31 VITALS — BP 148/73 | HR 81 | Temp 98.1°F | Ht 60.0 in | Wt 167.8 lb

## 2015-05-31 DIAGNOSIS — L298 Other pruritus: Secondary | ICD-10-CM

## 2015-05-31 DIAGNOSIS — B373 Candidiasis of vulva and vagina: Secondary | ICD-10-CM | POA: Insufficient documentation

## 2015-05-31 DIAGNOSIS — B3731 Acute candidiasis of vulva and vagina: Secondary | ICD-10-CM

## 2015-05-31 DIAGNOSIS — N3289 Other specified disorders of bladder: Secondary | ICD-10-CM

## 2015-05-31 DIAGNOSIS — Q6470 Unspecified congenital malformation of bladder and urethra: Secondary | ICD-10-CM

## 2015-05-31 DIAGNOSIS — N898 Other specified noninflammatory disorders of vagina: Secondary | ICD-10-CM

## 2015-05-31 LAB — POCT URINALYSIS DIPSTICK
Blood, UA: NEGATIVE
Glucose, UA: NEGATIVE
Ketones, UA: NEGATIVE
LEUKOCYTES UA: NEGATIVE
Nitrite, UA: NEGATIVE
PH UA: 6
PROTEIN UA: NEGATIVE
Spec Grav, UA: >=1.03
UROBILINOGEN UA: 1

## 2015-05-31 MED ORDER — FLUCONAZOLE 150 MG PO TABS
150.0000 mg | ORAL_TABLET | ORAL | Status: DC
Start: 2015-05-31 — End: 2016-02-29

## 2015-05-31 NOTE — Assessment & Plan Note (Signed)
Incompletely treated, will Rx diflucan 150mg  q72h x2. Also advised to decrease soda intake and that recurrent yeast infection would prompt the need to evaluate for diabetes (no other symptoms indicative of DM).

## 2015-05-31 NOTE — Patient Instructions (Signed)
Take diflucan by mouth, one pill when you pick up the prescription and then 1 pill 3 days later. If your symptoms do not go completely away or if they come back later, you should come back to the clinic for evaluation.

## 2015-05-31 NOTE — Progress Notes (Signed)
Subjective: [name] is a 64 y.o. female returning for vaginal irritation/itching.  She has was treated with metrogel by her PCP for same complaints but broke out into a rash, so was prescribed diflucan po x1 which helped but did not completely resolve her symptoms. She denies abd pain, fever, vaginal discharge.  Objective: BP 148/73 mmHg  Pulse 81  Temp(Src) 98.1 F (36.7 C) (Oral)  Ht 5' (1.524 m)  Wt 167 lb 12.8 oz (76.114 kg)  BMI 32.77 kg/m2 Gen: Well-appearing 64 y.o. female in no distress. Skin: No rash on palms or soles.  Pelvic: External genitalia within normal limits.  Vaginal mucosa pink, moist, normal rugae.  Nonfriable cervix without lesions. Curd-like discharge noted. No bleeding noted on speculum exam. No cervical motion tenderness. CMA present throughout duration of exam.   Assessment/Plan: [name] is a 64 y.o. female here for yeast infection.  See problem list for plan.

## 2015-06-01 ENCOUNTER — Encounter: Payer: Self-pay | Admitting: Family Medicine

## 2015-06-14 ENCOUNTER — Other Ambulatory Visit: Payer: Self-pay | Admitting: Internal Medicine

## 2015-06-20 ENCOUNTER — Ambulatory Visit (HOSPITAL_COMMUNITY): Payer: Self-pay | Admitting: Psychiatry

## 2015-06-25 ENCOUNTER — Other Ambulatory Visit (HOSPITAL_COMMUNITY): Payer: Self-pay | Admitting: Psychiatry

## 2015-06-25 DIAGNOSIS — F330 Major depressive disorder, recurrent, mild: Secondary | ICD-10-CM

## 2015-06-25 MED ORDER — MIRTAZAPINE 30 MG PO TABS: 30.0000 mg | ORAL_TABLET | Freq: Every day | ORAL | Status: DC

## 2015-06-25 MED ORDER — BUPROPION HCL ER (XL) 300 MG PO TB24: 300.0000 mg | ORAL_TABLET | Freq: Every day | ORAL | Status: DC

## 2015-07-05 ENCOUNTER — Ambulatory Visit (HOSPITAL_COMMUNITY): Payer: Self-pay | Admitting: Psychiatry

## 2015-07-06 DIAGNOSIS — H04122 Dry eye syndrome of left lacrimal gland: Secondary | ICD-10-CM | POA: Diagnosis not present

## 2015-07-06 DIAGNOSIS — H40053 Ocular hypertension, bilateral: Secondary | ICD-10-CM | POA: Diagnosis not present

## 2015-07-09 ENCOUNTER — Ambulatory Visit (HOSPITAL_COMMUNITY): Payer: Self-pay | Admitting: Psychiatry

## 2015-08-07 ENCOUNTER — Ambulatory Visit (INDEPENDENT_AMBULATORY_CARE_PROVIDER_SITE_OTHER): Payer: Medicare Other | Admitting: Psychiatry

## 2015-08-07 ENCOUNTER — Encounter (HOSPITAL_COMMUNITY): Payer: Self-pay | Admitting: Psychiatry

## 2015-08-07 VITALS — BP 148/79 | HR 70 | Ht 60.0 in | Wt 162.2 lb

## 2015-08-07 DIAGNOSIS — F330 Major depressive disorder, recurrent, mild: Secondary | ICD-10-CM

## 2015-08-07 MED ORDER — HYDROXYZINE PAMOATE 25 MG PO CAPS: 25.0000 mg | ORAL_CAPSULE | Freq: Every day | ORAL | Status: DC

## 2015-08-07 MED ORDER — BUPROPION HCL ER (XL) 300 MG PO TB24: 300.0000 mg | ORAL_TABLET | Freq: Every day | ORAL | Status: DC

## 2015-08-07 MED ORDER — MIRTAZAPINE 30 MG PO TABS: 30.0000 mg | ORAL_TABLET | Freq: Every day | ORAL | Status: DC

## 2015-08-07 NOTE — Progress Notes (Signed)
[place] Progress Note  [name] [id number] 64 y.o.  08/07/2015 3:27 PM  Chief Complaint:  Medication management and follow-up.         History of Present Illness:   [name] came for her followup appointment.  She is taking her medication without any side effects.  She continues to have grief losing her sister in January.  She tried seeing Tharon Aquas however she is not comfortable and like to see a different therapist in this office.  She is taking Vistaril every night but is helping her sleep.  She denies any major panic attack. She denies crying spells but endorsed having grief and social withdrawal.  Her energy level is okay.  Patient denies drinking or using any illegal substances.  She lives by herself however her daughter live close by who checks on her every day.  Her appetite is okay.  Her vitals are stable.  Suicidal Ideation: No Plan Formed: No Patient has means to carry out plan: No  Homicidal Ideation: No Plan Formed: No Patient has means to carry out plan: No  Review of Systems: Psychiatric: Agitation: No Hallucination: No Depressed Mood: No Insomnia: No Hypersomnia: No Altered Concentration: No Feels Worthless: No Grandiose Ideas: No Belief In Special Powers: No New/Increased Substance Abuse: No Compulsions: No  Neurologic: Headache: Yes Seizure: No Paresthesias: No  Medical History;  She has history of hypertension, migraine headache, asthma, GERD, chronic pain.  She is seeing a physician at [facility]  internal medicine.    Outpatient Encounter Prescriptions as of 08/07/2015  Medication Sig  . acyclovir (ZOVIRAX) 200 MG capsule TAKE 1 CAPSULE BY MOUTH FIVE TIMES DAILY  . albuterol (PROAIR HFA) 108 (90 BASE) MCG/ACT inhaler Inhale 2 puffs into the lungs as directed.  [name] atorvastatin (LIPITOR) 40 MG tablet Take 1 tablet (40 mg total) by mouth daily.  . budesonide-formoterol (SYMBICORT) 80-4.5 MCG/ACT inhaler Inhale 2 puffs into the  lungs 2 (two) times daily.  [name] buPROPion (WELLBUTRIN XL) 300 MG 24 hr tablet Take 1 tablet (300 mg total) by mouth daily.  . cetirizine (ZYRTEC) 10 MG tablet Take 1 tablet (10 mg total) by mouth daily.  . Cholecalciferol (VITAMIN D3) 50000 UNITS CAPS Take 1 capsule by mouth once a week.  . cloNIDine (CATAPRES) 0.3 MG tablet TAKE 1 AND 1/2 TABLETS BY MOUTH EVERY NIGHT AT BEDTIME  . fluconazole (DIFLUCAN) 150 MG tablet Take 1 tablet (150 mg total) by mouth every 3 (three) days.  . fluticasone (FLONASE) 50 MCG/ACT nasal spray Place 2 sprays into both nostrils daily.  [name] gabapentin (NEURONTIN) 100 MG capsule TAKE ONE CAPSULE BY MOUTH THREE TIMES DAILY  . hydrOXYzine (VISTARIL) 25 MG capsule Take 1 capsule (25 mg total) by mouth at bedtime.  [name] LINZESS 145 MCG CAPS capsule TAKE 1 CAPSULE DAILY.  [name] lisinopril-hydrochlorothiazide (PRINZIDE,ZESTORETIC) 10-12.5 MG per tablet TAKE 1 TABLET BY MOUTH DAILY  . metroNIDAZOLE (METROGEL VAGINAL) 0.75 % vaginal gel Place 1 Applicatorful vaginally 2 (two) times daily.  . mirtazapine (REMERON) 30 MG tablet Take 1 tablet (30 mg total) by mouth at bedtime.  . montelukast (SINGULAIR) 10 MG tablet Take 1 tablet (10 mg total) by mouth at bedtime.  . Olopatadine HCl 0.2 % SOLN Apply 1 drop to eye daily.  . potassium chloride SA (K-DUR,KLOR-CON) 20 MEQ tablet TAKE 1 TABLET BY MOUTH DAILY  . predniSONE (DELTASONE) 50 MG tablet Take 1 tablet (50 mg total) by mouth daily with breakfast. x5 days  . ranitidine (ZANTAC)  150 MG tablet TAKE 1 TABLET BY MOUTH TWICE DAILY  . Spacer/Aero-Holding Chambers (AEROCHAMBER PLUS FLO-VU LARGE) MISC 1 each by Other route once.  . topiramate (TOPAMAX) 25 MG tablet TAKE 1 TABLET BY MOUTH TWO TIMES DAILY  . Vitamin D, Ergocalciferol, (DRISDOL) 50000 UNITS CAPS capsule TAKE 1 CAPSULE BY MOUTH ONCE A WEEK  . [DISCONTINUED] buPROPion (WELLBUTRIN XL) 300 MG 24 hr tablet Take 1 tablet (300 mg total) by mouth daily.  . [DISCONTINUED] cloNIDine  (CATAPRES) 0.3 MG tablet TAKE 1&1/2 TABLET BY MOUTH AT BEDTIME  . [DISCONTINUED] hydrOXYzine (VISTARIL) 25 MG capsule Take 1 capsule (25 mg total) by mouth at bedtime.  . [DISCONTINUED] mirtazapine (REMERON) 30 MG tablet Take 1 tablet (30 mg total) by mouth at bedtime.   No facility-administered encounter medications on file as of 08/07/2015.    No results found for this or any previous visit (from the past 72 hour(s)).  Past Psychiatric History/Hospitalization(s): She has history of psychiatric inpatient treatment at [facility].  She was admitted to get detox from her Xanax.  Patient also seeing psychiatrist when she was in [employer] in [place] in [place] and than [facility].  In the past she had tried Paxil Prozac Zoloft.  Patient denies any history of suicidal attempt or any psychosis. Anxiety: Yes Bipolar Disorder: No Depression: Yes Mania: No Psychosis: No Schizophrenia: No Personality Disorder: No Hospitalization for psychiatric illness: Yes History of Electroconvulsive Shock Therapy: No Prior Suicide Attempts: No  Review of Systems  Psychiatric/Behavioral: Negative for suicidal ideas and substance abuse.   Constitutional:  BP 148/79 mmHg  Pulse 70  Ht 5' (1.524 m)  Wt 162 lb 3.2 oz (73.573 kg)  BMI 31.68 kg/m2  Recent Results (from the past 2160 hour(s))  POCT Wet Prep [facility])     Status: Abnormal   Collection Time: 05/17/15  3:41 PM  Result Value Ref Range   Source Wet Prep POC VAG    WBC, Wet Prep HPF POC 5-10    Bacteria Wet Prep HPF POC Many (A) None, Few   Clue Cells Wet Prep HPF POC None None   Clue Cells Wet Prep Whiff POC Negative Whiff    Yeast Wet Prep HPF POC Few    Trichomonas Wet Prep HPF POC NONE   Lipid Panel     Status: Abnormal   Collection Time: 05/17/15  3:47 PM  Result Value Ref Range   Cholesterol 193 0 - 200 mg/dL    Comment: ATP III Classification:       < 200        mg/dL        Desirable      200 -  239     mg/dL        Borderline High      >= 240        mg/dL        High      Triglycerides 253 (H) <150 mg/dL   HDL 35 (L) >=46 mg/dL   Total CHOL/HDL Ratio 5.5 Ratio   VLDL 51 (H) 0 - 40 mg/dL   LDL Cholesterol 107 (H) 0 - 99 mg/dL    Comment:   Total Cholesterol/HDL Ratio:CHD Risk                        Coronary Heart Disease Risk Table  Men       Women          1/2 Average Risk              3.4        3.3              Average Risk              5.0        4.4           2X Average Risk              9.6        7.1           3X Average Risk             23.4       11.0 Use the calculated Patient Ratio above and the CHD Risk table  to determine the patient's CHD Risk. ATP III Classification (LDL):       < 100        mg/dL         Optimal      100 - 129     mg/dL         Near or Above Optimal      130 - 159     mg/dL         Borderline High      160 - 189     mg/dL         High       > 190        mg/dL         Very High     Urinalysis Dipstick     Status: Abnormal   Collection Time: 05/31/15 11:49 AM  Result Value Ref Range   Color, UA YELLOW    Clarity, UA CLEAR    Glucose, UA NEG    Bilirubin, UA SMALL    Ketones, UA NEG    Spec Grav, UA >=1.030    Blood, UA NEG    pH, UA 6.0    Protein, UA NEG    Urobilinogen, UA 1.0    Nitrite, UA NEG    Leukocytes, UA Negative Negative   Musculoskeletal: Strength & Muscle Tone: within normal limits Gait & Station: normal  Mental Status Examination;  Patient is casually dressed and fairly groomed.  She maintained fair eye contact.  She described her mood euthymic and her affect is appropriate.  Her speech is clear and coherent.  Her thought processes logical linear and goal-directed.  She denies any auditory or visual hallucination.  She denies any active or passive suicidal thoughts or homicidal thoughts.  There were no delusion or psychotic symptoms present at this time.  There were no flight  of idea or loose association.  Her fund of knowledge is adequate.  There no tremors or shakes present.  Her attention and concentration is fair.  She's alert and oriented x3.  Her insight judgment and impulse control is okay.   Review of Psycho-Social Stressors (1), Review of Last Therapy Session (1) and Review of Medication Regimen & Side Effects (2)  Assessment: Axis I: Depressive disorder NOS  Axis II: Deferred  Axis III: See medical history  Plan:  I will continue Remeron 30 mg at bedtime and Wellbutrin XL 300 mg daily and Vistaril 25 mg at bedtime.  We will schedule appointment with Legrand Pitts for counseling.  Discussed medication side  effects and benefits.  Recommended to call us back if she has any question or any concern.  Follow-up in 3 months.   Amra Shukla T., MD 08/07/2015

## 2015-08-23 DIAGNOSIS — H04123 Dry eye syndrome of bilateral lacrimal glands: Secondary | ICD-10-CM | POA: Diagnosis not present

## 2015-08-29 ENCOUNTER — Ambulatory Visit (INDEPENDENT_AMBULATORY_CARE_PROVIDER_SITE_OTHER): Payer: Medicare Other | Admitting: Psychology

## 2015-08-29 DIAGNOSIS — F330 Major depressive disorder, recurrent, mild: Secondary | ICD-10-CM

## 2015-08-29 NOTE — Progress Notes (Signed)
   THERAPIST PROGRESS NOTE  Session Time: 1.33pm-2.27pm  Participation Level: Active  Behavioral Response: Well GroomedAlertDepressed  Type of Therapy: Individual Therapy  Treatment Goals addressed: Diagnosis: MDD and establishing rapport.  Interventions: Strength-based and Supportive  Summary: [name] is a 64 y.o. female who presents with affect WNL.  Pt reported that she is referred by Dr. Adele Schilder as had attempted counseling w/ Audelia Acton and didn't feel that good fit. Pt shared about her family- 6 grandchildren and 3 children.  Pt reported that she first dealt w/ depression after her grandmother's death in Feb 15, 2001.  Pt reported that she was committed to [facility] for emotional break down dealing w/ grief, stress of feeling granddaughter mistreated and not taking Xanax.  Pt reported loss of her mother in Feb 15, 2006 was also difficult.  Pt reported that 2014-11-17  sister died at age 51y/o due to infection.  Pt reported that weren't particularly close but that there for each other. Pt reported that her sister kept a lot to self so no one knew how sick she was.  Pt also reported bothered that didn't get to talk w/ her at the end of her life as was induced into a coma.  Pt reported that also her live in boyfriend was making decisions for her care initially and family wasn't even being notified.  Pt reported that she also didn't have a funeral as no money for and family just scrapped enough money for a cremation.  Pt reported that she is thinking about how to have a personal memorial for her on anniversary of death this year for more closure.  Pt reported that this is the first she has been able to talk about her w/out crying.  Suicidal/Homicidal: Nowithout intent/plan  Therapist Response: Assessed pt current functioning per pt report.  Explored w/ pt her hx of tx and transfer to new counselor.  Explored w/pt stressors of losses over these years and grief from sister's death.  Discussed her  beliefs and how to create memorial for sister to have meaningful way of celebrate her life and saying goodbye.    Plan: Return again in 3 weeks.  Diagnosis: MDD, mild    Lorris Carducci, LPC 08/29/2015

## 2015-08-31 ENCOUNTER — Other Ambulatory Visit: Payer: Self-pay | Admitting: Internal Medicine

## 2015-09-10 ENCOUNTER — Other Ambulatory Visit: Payer: Self-pay | Admitting: *Deleted

## 2015-09-27 ENCOUNTER — Ambulatory Visit (HOSPITAL_COMMUNITY): Payer: Self-pay | Admitting: Psychology

## 2015-10-06 ENCOUNTER — Other Ambulatory Visit: Payer: Self-pay | Admitting: Family Medicine

## 2015-10-08 ENCOUNTER — Ambulatory Visit (HOSPITAL_COMMUNITY): Payer: Self-pay | Admitting: Psychology

## 2015-11-06 ENCOUNTER — Other Ambulatory Visit (HOSPITAL_COMMUNITY): Payer: Self-pay | Admitting: Psychiatry

## 2015-11-06 ENCOUNTER — Telehealth: Payer: Self-pay | Admitting: *Deleted

## 2015-11-06 NOTE — Telephone Encounter (Signed)
Called patient to offer flu vaccine, however, VM picked up.  Left message on patient's voice mail to return call. Nancyjo Givhan L, RN   

## 2015-11-07 ENCOUNTER — Ambulatory Visit (INDEPENDENT_AMBULATORY_CARE_PROVIDER_SITE_OTHER): Payer: Medicare Other | Admitting: Psychiatry

## 2015-11-07 ENCOUNTER — Encounter (HOSPITAL_COMMUNITY): Payer: Self-pay | Admitting: Psychiatry

## 2015-11-07 VITALS — BP 150/84 | HR 74 | Ht 60.0 in | Wt 166.8 lb

## 2015-11-07 DIAGNOSIS — F330 Major depressive disorder, recurrent, mild: Secondary | ICD-10-CM | POA: Diagnosis not present

## 2015-11-07 MED ORDER — MIRTAZAPINE 30 MG PO TABS: 30.0000 mg | ORAL_TABLET | Freq: Every day | ORAL | Status: DC

## 2015-11-07 MED ORDER — BUPROPION HCL ER (XL) 300 MG PO TB24: 300.0000 mg | ORAL_TABLET | Freq: Every day | ORAL | Status: DC

## 2015-11-07 MED ORDER — HYDROXYZINE PAMOATE 25 MG PO CAPS: 25.0000 mg | ORAL_CAPSULE | Freq: Every day | ORAL | Status: DC | PRN

## 2015-11-07 NOTE — Progress Notes (Signed)
[facility] Progress Note  [name] [id number] 65 y.o.  11/07/2015 2:47 PM  Chief Complaint:  Medication management and follow-up.         History of Present Illness:   [name] came for her followup appointment.  She had a good Christmas.  She spent time with her daughter.  She is taking her medication as prescribed.  She is taking Vistaril only as needed .  She likes Vistaril which is helping her sleep.  She denies any major panic attack or any feeling of hopelessness or worthlessness.  Her energy level is okay.  She denies any irritability, anger, mood swing or any paranoia.  Patient denies drinking or using any illegal substances.  She started seeing [name] for counseling and she feels comfortable with her new therapist.  Her appetite is okay.  Her vitals are stable.  She has no tremors shakes or any EPS.  She lives by herself however her daughter live close by who checks on her every day.   Suicidal Ideation: No Plan Formed: No Patient has means to carry out plan: No  Homicidal Ideation: No Plan Formed: No Patient has means to carry out plan: No  Review of Systems: Psychiatric: Agitation: No Hallucination: No Depressed Mood: No Insomnia: No Hypersomnia: No Altered Concentration: No Feels Worthless: No Grandiose Ideas: No Belief In Special Powers: No New/Increased Substance Abuse: No Compulsions: No  Neurologic: Headache: Yes Seizure: No Paresthesias: No  Medical History;  She has history of hypertension, migraine headache, asthma, GERD, chronic pain.  She is seeing a physician at [facility]  internal medicine.    Outpatient Encounter Prescriptions as of 11/07/2015  Medication Sig  . acyclovir (ZOVIRAX) 200 MG capsule TAKE 1 CAPSULE BY MOUTH FIVE TIMES DAILY  . albuterol (PROAIR HFA) 108 (90 BASE) MCG/ACT inhaler Inhale 2 puffs into the lungs as directed.  [name] atorvastatin (LIPITOR) 40 MG tablet Take 1 tablet (40 mg total) by mouth daily.  .  budesonide-formoterol (SYMBICORT) 80-4.5 MCG/ACT inhaler Inhale 2 puffs into the lungs 2 (two) times daily.  [name] buPROPion (WELLBUTRIN XL) 300 MG 24 hr tablet Take 1 tablet (300 mg total) by mouth daily.  . cetirizine (ZYRTEC) 10 MG tablet Take 1 tablet (10 mg total) by mouth daily.  . Cholecalciferol (VITAMIN D3) 50000 UNITS CAPS Take 1 capsule by mouth once a week.  . cloNIDine (CATAPRES) 0.3 MG tablet TAKE 1 AND 1/2 TABLETS BY MOUTH EVERY NIGHT AT BEDTIME  . fluconazole (DIFLUCAN) 150 MG tablet Take 1 tablet (150 mg total) by mouth every 3 (three) days.  . fluticasone (FLONASE) 50 MCG/ACT nasal spray Place 2 sprays into both nostrils daily.  [name] gabapentin (NEURONTIN) 100 MG capsule TAKE ONE CAPSULE BY MOUTH THREE TIMES DAILY  . hydrOXYzine (VISTARIL) 25 MG capsule Take 1 capsule (25 mg total) by mouth daily as needed for anxiety.  [name] LINZESS 145 MCG CAPS capsule TAKE 1 CAPSULE BY MOUTH DAILY  . lisinopril-hydrochlorothiazide (PRINZIDE,ZESTORETIC) 10-12.5 MG per tablet TAKE 1 TABLET BY MOUTH DAILY  . metroNIDAZOLE (METROGEL VAGINAL) 0.75 % vaginal gel Place 1 Applicatorful vaginally 2 (two) times daily.  . mirtazapine (REMERON) 30 MG tablet Take 1 tablet (30 mg total) by mouth at bedtime.  . montelukast (SINGULAIR) 10 MG tablet Take 1 tablet (10 mg total) by mouth at bedtime.  . Olopatadine HCl 0.2 % SOLN Apply 1 drop to eye daily.  . potassium chloride SA (K-DUR,KLOR-CON) 20 MEQ tablet TAKE 1 TABLET BY MOUTH DAILY  .  predniSONE (DELTASONE) 50 MG tablet Take 1 tablet (50 mg total) by mouth daily with breakfast. x5 days  . ranitidine (ZANTAC) 150 MG tablet TAKE 1 TABLET BY MOUTH TWICE DAILY  . Spacer/Aero-Holding Chambers (AEROCHAMBER PLUS FLO-VU LARGE) MISC 1 each by Other route once.  . topiramate (TOPAMAX) 25 MG tablet TAKE 1 TABLET BY MOUTH TWO TIMES DAILY  . Vitamin D, Ergocalciferol, (DRISDOL) 50000 UNITS CAPS capsule TAKE 1 CAPSULE BY MOUTH ONCE A WEEK  . [DISCONTINUED] buPROPion (WELLBUTRIN  XL) 300 MG 24 hr tablet Take 1 tablet (300 mg total) by mouth daily.  . [DISCONTINUED] hydrOXYzine (VISTARIL) 25 MG capsule Take 1 capsule (25 mg total) by mouth at bedtime.  . [DISCONTINUED] mirtazapine (REMERON) 30 MG tablet Take 1 tablet (30 mg total) by mouth at bedtime.   No facility-administered encounter medications on file as of 11/07/2015.    No results found for this or any previous visit (from the past 72 hour(s)).  Past Psychiatric History/Hospitalization(s): She has history of psychiatric inpatient treatment at [facility].  She was admitted to get detox from her Xanax.  Patient also seeing psychiatrist when she was in [employer] in [place] in [place] and than [facility].  In the past she had tried Paxil Prozac Zoloft.  Patient denies any history of suicidal attempt or any psychosis. Anxiety: Yes Bipolar Disorder: No Depression: Yes Mania: No Psychosis: No Schizophrenia: No Personality Disorder: No Hospitalization for psychiatric illness: Yes History of Electroconvulsive Shock Therapy: No Prior Suicide Attempts: No  Review of Systems  Psychiatric/Behavioral: Negative for suicidal ideas and substance abuse.   Constitutional:  BP 150/84 mmHg  Pulse 74  Ht 5' (1.524 m)  Wt 166 lb 12.8 oz (75.66 kg)  BMI 32.58 kg/m2  No results found for this or any previous visit (from the past 2160 hour(s)). Musculoskeletal: Strength & Muscle Tone: within normal limits Gait & Station: normal  Mental Status Examination;  Patient is casually dressed and fairly groomed.  She maintained fair eye contact.  She described her mood euthymic and her affect is appropriate.  Her speech is clear and coherent.  Her thought processes logical linear and goal-directed.  She denies any auditory or visual hallucination.  She denies any active or passive suicidal thoughts or homicidal thoughts.  There were no delusion or psychotic symptoms present at this time.  There were  no flight of idea or loose association.  Her fund of knowledge is adequate.  There no tremors or shakes present.  Her attention and concentration is fair.  She's alert and oriented x3.  Her insight judgment and impulse control is okay.   Review of Psycho-Social Stressors (1), Review of Last Therapy Session (1) and Review of Medication Regimen & Side Effects (2)  Assessment: Axis I: Depressive disorder NOS  Axis II: Deferred  Axis III: See medical history  Plan:  Patient is a stable on her current medication.  will continue Remeron 30 mg at bedtime and Wellbutrin XL 300 mg daily.  She is taking Vistaril 25 mg as needed.  Discussed medication side effects and benefits.  Encouraged to keep appointment with [name] for counseling.  Recommended to call us back if she has any question or any concern.  Follow-up in 3 months.   Leeanne Butters T., MD 11/07/2015

## 2015-11-13 ENCOUNTER — Other Ambulatory Visit: Payer: Self-pay | Admitting: *Deleted

## 2015-11-13 DIAGNOSIS — L989 Disorder of the skin and subcutaneous tissue, unspecified: Secondary | ICD-10-CM

## 2015-11-13 MED ORDER — NYSTATIN 100000 UNIT/GM EX POWD: Freq: Three times a day (TID) | CUTANEOUS | Status: DC

## 2015-11-14 ENCOUNTER — Other Ambulatory Visit: Payer: Self-pay | Admitting: Family Medicine

## 2015-11-14 MED ORDER — GABAPENTIN 100 MG PO CAPS: 100.0000 mg | ORAL_CAPSULE | Freq: Three times a day (TID) | ORAL | Status: DC

## 2015-11-14 NOTE — Telephone Encounter (Signed)
2nd request.  Martin, Tamika L, RN  

## 2015-11-26 ENCOUNTER — Ambulatory Visit: Payer: Self-pay | Admitting: Family Medicine

## 2015-11-26 ENCOUNTER — Encounter: Payer: Self-pay | Admitting: Student

## 2015-11-26 ENCOUNTER — Ambulatory Visit (INDEPENDENT_AMBULATORY_CARE_PROVIDER_SITE_OTHER): Payer: Medicare Other | Admitting: Student

## 2015-11-26 VITALS — BP 166/84 | HR 61 | Temp 98.2°F | Wt 168.0 lb

## 2015-11-26 DIAGNOSIS — Z23 Encounter for immunization: Secondary | ICD-10-CM | POA: Diagnosis present

## 2015-11-26 NOTE — Progress Notes (Signed)
Patient was unaware until being brought back to the room that her pcp was out sick today.  She informed me that she would rather get her prevnar 13 and flu only today and then see her pcp for her physical.  Changed appt from a provider visit to a nurse visit.  Jazmin Hartsell,CMA  Patient seen for nurse visit. Provider was available should she have had questions or concerns  Alyssa A. Lincoln Brigham MD, [facility] Resident PGY-2 Pager [phone]

## 2015-12-04 ENCOUNTER — Ambulatory Visit (HOSPITAL_COMMUNITY): Payer: Self-pay | Admitting: Psychology

## 2015-12-04 DIAGNOSIS — H2513 Age-related nuclear cataract, bilateral: Secondary | ICD-10-CM | POA: Diagnosis not present

## 2015-12-04 DIAGNOSIS — H02825 Cysts of left lower eyelid: Secondary | ICD-10-CM | POA: Diagnosis not present

## 2015-12-04 DIAGNOSIS — H524 Presbyopia: Secondary | ICD-10-CM | POA: Diagnosis not present

## 2015-12-06 ENCOUNTER — Other Ambulatory Visit (HOSPITAL_COMMUNITY): Payer: Self-pay | Admitting: Psychiatry

## 2015-12-10 ENCOUNTER — Other Ambulatory Visit (HOSPITAL_COMMUNITY): Payer: Self-pay | Admitting: Psychiatry

## 2015-12-13 ENCOUNTER — Ambulatory Visit: Payer: Self-pay | Admitting: Family Medicine

## 2016-01-01 ENCOUNTER — Ambulatory Visit (INDEPENDENT_AMBULATORY_CARE_PROVIDER_SITE_OTHER): Payer: Medicare Other | Admitting: Family Medicine

## 2016-01-01 ENCOUNTER — Encounter: Payer: Self-pay | Admitting: Family Medicine

## 2016-01-01 VITALS — BP 142/82 | HR 65 | Temp 98.1°F | Wt 168.7 lb

## 2016-01-01 DIAGNOSIS — H578 Other specified disorders of eye and adnexa: Secondary | ICD-10-CM

## 2016-01-01 DIAGNOSIS — L304 Erythema intertrigo: Secondary | ICD-10-CM | POA: Diagnosis not present

## 2016-01-01 DIAGNOSIS — H5789 Other specified disorders of eye and adnexa: Secondary | ICD-10-CM

## 2016-01-01 MED ORDER — CLOTRIMAZOLE 1 % EX CREA: 1.0000 "application " | TOPICAL_CREAM | Freq: Two times a day (BID) | CUTANEOUS | Status: DC

## 2016-01-01 NOTE — Patient Instructions (Signed)
I recommend that you get Systane eye drops to lubricate your eyes until you see your eye doctor.  Keratoconjunctivitis Sicca Keratoconjunctivitis sicca (dry eye) is dryness of the membranes surrounding the eye. It is caused by inadequate production of natural tears. The eyes must remain lubricated at all times. This creates a smooth surface of the cornea (clear covering at the front of the eye), which allows the eyelids to slide over the eye without causing soreness and irritation. A small amount of tears are constantly produced by the tear glands (lacrimal glands). These glands are located under the outside part of the upper eyelids. Dry eyes are often caused by decreased tear production. This condition is called aqueous tear-deficient dry eyes. The tear glands do not produce enough tears to keep the tissues surrounding the eye moist. This condition is common in postmenopausal women. Dry eyes may also be caused by an abnormality of how the tears are made. This can result in faster evaporation of the tears. It is called evaporative dry eyes. Although the tear glands produce enough tears, the rate of evaporation is so fast that the entire eye surface cannot be kept covered with a complete layer of tears. The condition can occur during certain activities or in unusually dry surroundings.  Sometimes dry eyes are symptoms of other diseases that can affect the eyes. Some examples are:   Rheumatoid arthritis.  Systemic lupus erythematosus (lupus). Chronic inflammatory disease, causing the body to attack its own tissue.  Sjogren syndrome. Dryness of the eyes and mouth, sometimes associated with a form of rheumatoid arthritis or lupus. SYMPTOMS  Symptoms of dry eyes include:  Irritation.  Itching.  Redness.  Burning and feeling as though there is something gritty in the eye. If the surface of the eye becomes damaged, sensitivity may increase, along with increased discomfort and sensitivity to bright  light. Symptoms are made worse by activities with decreased blinking, such as staring at a television, book, or computer screen.  Symptoms are also worse in dusty or smoky areas and in dry environments. Certain drugs can make symptoms worse, including antihistamines, tranquilizers, diuretics, antihypertensives (blood pressure medicine), and oral contraceptives. Symptoms tend to improve during cool, rainy, or foggy weather and in humid places, such as in the shower. DIAGNOSIS  Dry eyes are usually diagnosed by symptoms alone. Sometimes a Schirmer test is done, in which a strip of filter paper is placed at the edge of the eyelid. The amount of moisture bathing the eye is measured. This test is done by measuring how far the tears go up a strip of paper applied to the eye in a set amount of time. Your caregiver will use a special microscope to examine the surface of the eye and the cornea to see if there are signs of dryness. TREATMENT  Artificial tears (eye drops made with substances that simulate real tears) applied every few hours can generally control the problem. Lubricating ointments may help more severe cases. Avoiding dry, drafty environments and using humidifiers may also help. Minor surgery can be done to block the flow of tears into the nose, so that more tears are available to bathe the eyes. Patients with evaporative dry eyes may also benefit from treatment of the inflammation (redness and soreness) of the eyelids, which often occurs with the dry eyes. This treatment may include warm compresses, eyelid margin scrubs, or oral medicines. PROGNOSIS  Even with severe dry eyes, it is uncommon to lose vision. At times, it may become difficult  to see. Rarely, scarring and ulcers may occur, and blood vessels can grow across the cornea. Scarring and blood vessel growth can impair vision. In severe cases, the cornea may become damaged or infected. Ulcers or infections of the cornea are serious  complications. PREVENTION  There is no way to prevent getting keratoconjunctivitis sicca. However, complications can be prevented by keeping the eyes as moist as possible with artificial drops, as needed. SEEK IMMEDIATE MEDICAL CARE IF:   Your eye pain gets worse.  You develop a pus-like drainage from the eye.  The eye drops or medicines prescribed by your caregiver are not helping.  You have dry eyes and a sudden increase in discomfort or redness.  You have a sudden decrease in vision.  You have any other questions or concerns.   This information is not intended to replace advice given to you by your health care provider. Make sure you discuss any questions you have with your health care provider.   Document Released: 08/30/2004 Document Revised: 11/03/2014 Document Reviewed: 09/03/2009 Elsevier Interactive Patient Education [employer].

## 2016-01-01 NOTE — Progress Notes (Signed)
    Subjective: CC: rash, eyes feel like burning HPI: [name] is a 65 y.o. female presenting to clinic today for office visit. Concerns today include:  1. Intertrigo Patient notes that she has been having flares of rash on her bikini line.  She notes that she has to use certain underwear because bikinis irritate the area.  No fevers, chills or oozing from rash.  2. Eyes Patient notes that she was cooking and she accidentally melted a handle to the frying pan on Friday.  She notes that when she got near it, she felt like her eyes burned.  She has continued to have a burning sensation in her eyes ever since.  Patient denies blurry vision, double vision, loss of vision, photophobia, pain with ocular movement, ocular discharge.    Social History Reviewed: active smoker. 1 pack per 3 months. FamHx and MedHx reviewed.  Please see EMR. Health Maintenance: TDap  ROS: Per HPI  Objective: Office vital signs reviewed. BP 142/82 mmHg  Pulse 65  Temp(Src) 98.1 F (36.7 C) (Oral)  Wt 168 lb 11.8 oz (76.54 kg)  Physical Examination:  General: Awake, alert, well nourished, No acute distress HEENT: Normal    Neck: No masses palpated. No lymphadenopathy    Ears: Tympanic membranes intact, normal light reflex, no erythema, no bulging    Eyes: PERRLA, EOMI, fundoscopic exam performed, No corneal abnormalities appreciated. No conjunctival injection. NO purulence    Nose: nasal turbinates moist    Throat: moist mucus membranes, no erythema Skin: pannus with visible sheen beneath it but no exudate or erythema.  No scaling or skin breakdown  Assessment/ Plan: 65 y.o. female   1. Burning sensation in eye.  Funduscopic exam grossly normal.  No corneal abnormalities appreciated. No conjunctival injection.  No purulence or pain with EOM. Dry eye vs iritis vs allergic conjunctivitis (though eye exam normal)  - Recommend getting lubricating eye gtts and following up with eye doctor. -  Return precautions reviewed  2. Pruritic intertrigo. Does not appear to be flared today. - clotrimazole (LOTRIMIN) 1 % cream; Apply 1 application topically 2 (two) times daily.  Dispense: 30 g; Refill: 0 - Weight loss encouraged  Janora Norlander, DO PGY-2, [place]

## 2016-01-03 ENCOUNTER — Encounter (HOSPITAL_COMMUNITY): Payer: Self-pay | Admitting: Psychology

## 2016-01-04 ENCOUNTER — Other Ambulatory Visit: Payer: Self-pay | Admitting: Internal Medicine

## 2016-01-04 DIAGNOSIS — H04123 Dry eye syndrome of bilateral lacrimal glands: Secondary | ICD-10-CM | POA: Diagnosis not present

## 2016-01-18 ENCOUNTER — Ambulatory Visit: Payer: Self-pay | Admitting: Family Medicine

## 2016-02-03 ENCOUNTER — Other Ambulatory Visit: Payer: Self-pay | Admitting: Family Medicine

## 2016-02-05 ENCOUNTER — Ambulatory Visit (HOSPITAL_COMMUNITY): Payer: Self-pay | Admitting: Psychiatry

## 2016-02-11 ENCOUNTER — Encounter (HOSPITAL_COMMUNITY): Payer: Self-pay | Admitting: Psychiatry

## 2016-02-11 ENCOUNTER — Ambulatory Visit (INDEPENDENT_AMBULATORY_CARE_PROVIDER_SITE_OTHER): Payer: Medicare Other | Admitting: Psychiatry

## 2016-02-11 VITALS — BP 134/88 | HR 72 | Ht 65.0 in | Wt 165.8 lb

## 2016-02-11 DIAGNOSIS — F330 Major depressive disorder, recurrent, mild: Secondary | ICD-10-CM | POA: Diagnosis not present

## 2016-02-11 MED ORDER — BUPROPION HCL ER (XL) 300 MG PO TB24: 300.0000 mg | ORAL_TABLET | Freq: Every day | ORAL | Status: DC

## 2016-02-11 MED ORDER — MIRTAZAPINE 30 MG PO TABS: 30.0000 mg | ORAL_TABLET | Freq: Every day | ORAL | Status: DC

## 2016-02-11 NOTE — Progress Notes (Signed)
[name]  [name] [id number] 65 y.o.  02/11/2016 2:12 PM  Chief Complaint:  Medication management and follow-up.         History of Present Illness:   [name] came for her followup appointment.  She is taking her medication as prescribed.  She is taking Wellbutrin and Remeron.  She described her mood stable.  She denies any irritability, anger, paranoia or any hallucination.  She sleeping good and rarely takes Vistaril.  She still has refill remaining.  She denies any feeling of hopelessness or worthlessness.  She missed appointment with [name] because she was busy helping her daughter who recently moved to a new place.  Patient like to continue her current psychiatric medication.  She has no side effects including any tremors, shakes or any EPS.  She lives by herself.  Patient denies drinking alcohol or using any illegal substances.  Her appetite is okay.  Her vitals are stable.  Suicidal Ideation: No Plan Formed: No Patient has means to carry out plan: No  Homicidal Ideation: No Plan Formed: No Patient has means to carry out plan: No  Review of Systems: Psychiatric: Agitation: No Hallucination: No Depressed Mood: No Insomnia: No Hypersomnia: No Altered Concentration: No Feels Worthless: No Grandiose Ideas: No Belief In Special Powers: No New/Increased Substance Abuse: No Compulsions: No  Neurologic: Headache: Yes Seizure: No Paresthesias: No  Medical History;  She has history of hypertension, migraine headache, asthma, GERD, chronic pain.  She is seeing a physician at [facility]  internal medicine.    Outpatient Encounter Prescriptions as of 02/11/2016  Medication Sig  . acyclovir (ZOVIRAX) 200 MG capsule TAKE 1 CAPSULE BY MOUTH FIVE TIMES DAILY  . albuterol (PROAIR HFA) 108 (90 BASE) MCG/ACT inhaler Inhale 2 puffs into the lungs as directed.  [name] atorvastatin (LIPITOR) 40 MG tablet Take 1 tablet (40 mg total) by mouth daily.   . budesonide-formoterol (SYMBICORT) 80-4.5 MCG/ACT inhaler Inhale 2 puffs into the lungs 2 (two) times daily.  [name] buPROPion (WELLBUTRIN XL) 300 MG 24 hr tablet Take 1 tablet (300 mg total) by mouth daily.  . cetirizine (ZYRTEC) 10 MG tablet Take 1 tablet (10 mg total) by mouth daily.  . Cholecalciferol (VITAMIN D3) 50000 UNITS CAPS Take 1 capsule by mouth once a week.  . clobetasol ointment (TEMOVATE) 0.05 % APPLY SMALL AMOUNT WITH FINGERTIP TO VULVA EVERY NIGHT AT BEDTIME.  . cloNIDine (CATAPRES) 0.3 MG tablet TAKE 1 AND 1/2 TABLETS BY MOUTH EVERY NIGHT AT BEDTIME  . clotrimazole (LOTRIMIN) 1 % cream Apply 1 application topically 2 (two) times daily.  . fluconazole (DIFLUCAN) 150 MG tablet Take 1 tablet (150 mg total) by mouth every 3 (three) days.  . fluticasone (FLONASE) 50 MCG/ACT nasal spray Place 2 sprays into both nostrils daily.  [name] gabapentin (NEURONTIN) 100 MG capsule TAKE 1 CAPSULE(100 MG) BY MOUTH THREE TIMES DAILY  . hydrOXYzine (VISTARIL) 25 MG capsule Take 1 capsule (25 mg total) by mouth daily as needed for anxiety.  [name] LINZESS 145 MCG CAPS capsule TAKE 1 CAPSULE BY MOUTH DAILY  . lisinopril-hydrochlorothiazide (PRINZIDE,ZESTORETIC) 10-12.5 MG per tablet TAKE 1 TABLET BY MOUTH DAILY  . metroNIDAZOLE (METROGEL VAGINAL) 0.75 % vaginal gel Place 1 Applicatorful vaginally 2 (two) times daily.  . mirtazapine (REMERON) 30 MG tablet Take 1 tablet (30 mg total) by mouth at bedtime.  . montelukast (SINGULAIR) 10 MG tablet Take 1 tablet (10 mg total) by mouth at bedtime.  [name] nystatin (MYCOSTATIN)  powder Apply topically 3 (three) times daily.  . Olopatadine HCl 0.2 % SOLN Apply 1 drop to eye daily.  . potassium chloride SA (K-DUR,KLOR-CON) 20 MEQ tablet TAKE 1 TABLET BY MOUTH DAILY  . predniSONE (DELTASONE) 50 MG tablet Take 1 tablet (50 mg total) by mouth daily with breakfast. x5 days  . ranitidine (ZANTAC) 150 MG tablet TAKE 1 TABLET BY MOUTH TWICE DAILY  . Spacer/Aero-Holding Chambers  (AEROCHAMBER PLUS FLO-VU LARGE) MISC 1 each by Other route once.  . topiramate (TOPAMAX) 25 MG tablet TAKE 1 TABLET BY MOUTH TWO TIMES DAILY  . Vitamin D, Ergocalciferol, (DRISDOL) 50000 UNITS CAPS capsule TAKE 1 CAPSULE BY MOUTH ONCE A WEEK  . [DISCONTINUED] buPROPion (WELLBUTRIN XL) 300 MG 24 hr tablet Take 1 tablet (300 mg total) by mouth daily.  . [DISCONTINUED] mirtazapine (REMERON) 30 MG tablet Take 1 tablet (30 mg total) by mouth at bedtime.   No facility-administered encounter medications on file as of 02/11/2016.    No results found for this or any previous visit (from the past 72 hour(s)).  Past Psychiatric History/Hospitalization(s): She has history of psychiatric inpatient treatment at [facility].  She was admitted to get detox from her Xanax.  Patient also seeing psychiatrist when she was in [employer] in [place] in [place] and than [facility].  In the past she had tried Paxil Prozac Zoloft.  Patient denies any history of suicidal attempt or any psychosis. Anxiety: Yes Bipolar Disorder: No Depression: Yes Mania: No Psychosis: No Schizophrenia: No Personality Disorder: No Hospitalization for psychiatric illness: Yes History of Electroconvulsive Shock Therapy: No Prior Suicide Attempts: No  Review of Systems  Psychiatric/Behavioral: Negative for suicidal ideas and substance abuse.   Constitutional:  BP 134/88 mmHg  Pulse 72  Ht 5\' 5"  (1.651 m)  Wt 165 lb 12.8 oz (75.206 kg)  BMI 27.59 kg/m2  No results found for this or any previous visit (from the past 2160 hour(s)). Musculoskeletal: Strength & Muscle Tone: within normal limits Gait & Station: normal  Mental Status Examination;  Patient is casually dressed and fairly groomed.  She maintained fair eye contact.  She described her mood euthymic and her affect is appropriate.  Her speech is clear and coherent.  Her thought processes logical linear and goal-directed.  She denies any  auditory or visual hallucination.  She denies any active or passive suicidal thoughts or homicidal thoughts.  There were no delusion or psychotic symptoms present at this time.  There were no flight of idea or loose association.  Her fund of knowledge is adequate.  There no tremors or shakes present.  Her attention and concentration is fair.  She's alert and oriented x3.  Her insight judgment and impulse control is okay.   Review of Psycho-Social Stressors (1), Review of Last Therapy Session (1) and Review of Medication Regimen & Side Effects (2)  Assessment: Axis I: Depressive disorder NOS  Axis II: Deferred  Axis III: See medical history  Plan:  Patient is a stable on her current medication.  She denies any side effects.  She rarely takes Vistaril and she still has refill remaining.  I will continue Remeron 30 mg at bedtime and Wellbutrin XL 300 mg daily.  Discussed medication side effects and benefits.  Encouraged to keep appointment with [name] for counseling.  Recommended to call us back if she has any question or any concern.  Follow-up in 3 months.   Lashanda Storlie T., MD 02/11/2016

## 2016-02-21 ENCOUNTER — Ambulatory Visit (HOSPITAL_COMMUNITY): Payer: Self-pay | Admitting: Psychology

## 2016-02-29 ENCOUNTER — Ambulatory Visit (INDEPENDENT_AMBULATORY_CARE_PROVIDER_SITE_OTHER): Payer: Medicare Other | Admitting: Family Medicine

## 2016-02-29 ENCOUNTER — Encounter: Payer: Self-pay | Admitting: Family Medicine

## 2016-02-29 VITALS — BP 156/57 | HR 62 | Temp 97.9°F | Ht 62.0 in | Wt 164.7 lb

## 2016-02-29 DIAGNOSIS — B009 Herpesviral infection, unspecified: Secondary | ICD-10-CM | POA: Diagnosis not present

## 2016-02-29 DIAGNOSIS — L298 Other pruritus: Secondary | ICD-10-CM

## 2016-02-29 DIAGNOSIS — Z206 Contact with and (suspected) exposure to human immunodeficiency virus [HIV]: Secondary | ICD-10-CM | POA: Diagnosis not present

## 2016-02-29 DIAGNOSIS — Z114 Encounter for screening for human immunodeficiency virus [HIV]: Secondary | ICD-10-CM

## 2016-02-29 DIAGNOSIS — Z1159 Encounter for screening for other viral diseases: Secondary | ICD-10-CM | POA: Diagnosis not present

## 2016-02-29 DIAGNOSIS — N898 Other specified noninflammatory disorders of vagina: Secondary | ICD-10-CM

## 2016-02-29 DIAGNOSIS — L820 Inflamed seborrheic keratosis: Secondary | ICD-10-CM | POA: Insufficient documentation

## 2016-02-29 DIAGNOSIS — Z20828 Contact with and (suspected) exposure to other viral communicable diseases: Secondary | ICD-10-CM | POA: Diagnosis not present

## 2016-02-29 HISTORY — DX: Herpesviral infection, unspecified: B00.9

## 2016-02-29 MED ORDER — VALACYCLOVIR HCL 500 MG PO TABS: 500.0000 mg | ORAL_TABLET | Freq: Two times a day (BID) | ORAL | Status: DC

## 2016-02-29 MED ORDER — FLUCONAZOLE 150 MG PO TABS: 150.0000 mg | ORAL_TABLET | ORAL | Status: DC

## 2016-02-29 NOTE — Patient Instructions (Signed)
I will contact you will the results of your labs.  If anything is abnormal, I will call you.  Otherwise, expect a copy to be mailed to you.  You should receive a call with your appointment for Dermatology and Gynecology.  Follow up with me as needed.  Seborrheic Keratosis Seborrheic keratosis is a common, noncancerous (benign) skin growth. This condition causes waxy, rough, tan, brown, or black spots to appear on the skin. These skin growths can be flat or raised. CAUSES The cause of this condition is not known. RISK FACTORS This condition is more likely to develop in:  People who have a family history of seborrheic keratosis.  People who are 2 or older.  People who are pregnant.  People who have had estrogen replacement therapy. SYMPTOMS This condition often occurs on the face, chest, shoulders, back, or other areas. These growths:  Are usually painless, but may become irritated and itchy.  Can be yellow, brown, black, or other colors.  Are slightly raised or have a flat surface.  Are sometimes rough or wart-like in texture.  Are often waxy on the surface.  Are round or oval-shaped.  Sometimes look like they are "stuck on."  Often occur in groups, but may occur as a single growth. DIAGNOSIS This condition is diagnosed with a medical history and physical exam. A sample of the growth may be tested (skin biopsy). You may need to see a skin specialist (dermatologist). TREATMENT Treatment is not usually needed for this condition, unless the growths are irritated or are often bleeding. You may also choose to have the growths removed if you do not like their appearance. Most commonly, these growths are treated with a procedure in which liquid nitrogen is applied to "freeze" off the growth (cryosurgery). They may also be burned off with electricity or cut off. HOME CARE INSTRUCTIONS  Watch your growth for any changes.  Keep all follow-up visits as told by your health care  provider. This is important.  Do not scratch or pick at the growth or growths. This can cause them to become irritated or infected. SEEK MEDICAL CARE IF:  You suddenly have many new growths.  Your growth bleeds, itches, or hurts.  Your growth suddenly becomes larger or changes color.   This information is not intended to replace advice given to you by your health care provider. Make sure you discuss any questions you have with your health care provider.   Document Released: 11/15/2010 Document Revised: 07/04/2015 Document Reviewed: 02/28/2015 Elsevier Interactive Patient Education [employer].

## 2016-02-29 NOTE — Progress Notes (Signed)
Subjective: CC: herpes, possible exposure to HIV, vaginal pruritis, mole HPI: [name] is a 64 y.o. female presenting to clinic today for office visit. Concerns today include:  1. Mole  She notes that she saw a new mole on in the inside of her left thigh about 2 months ago.  She notes that it catches on her clothes and hurts.  No bleeding.  She notes localized redness and irritation.  She reports that the irritation comes and goes.  Seems to be getting bigger.  No change in color or shape.  No personal or family history of skin cancer.  2. HSV 2 Notes that she was diagnosed with HSV by Dr Paulla Fore several years ago.  She reports that she continues to have break outs.  She would like to try Valtrex for outbreaks over Acyclovir (which she has been taking for outbreaks only even though it is prescribed as daily suppressive therapy).  She has not had more than 5 outbreaks in the last year.  Unsure if she has a bump right now but does not pain in her vaginal area that is typical of her outbreak.  3. Vaginal pruritis. Patient notes several month history of pruritis.  She notes that itching is internal.  She denies vaginal discharge or sexual activity.  She denies vaginal bleeding.  She has been seen several times for itching.  She reports that Diflucan helped but that itching comes back.  4. Possible exposure to HIV Patient notes that her sister was found to be HIV positive after her death.  She reports that she may have been exposed to her blood/fluids when she resided with her.  She would like to be screened for HIV.  No recent weight loss.  Social History Reviewed. FamHx and MedHx reviewed.   ROS: Per HPI  Objective: Office vital signs reviewed. BP 156/57 mmHg  Pulse 62  Temp(Src) 97.9 F (36.6 C) (Oral)  Ht 5\' 2"  (1.575 m)  Wt 164 lb 11.2 oz (74.707 kg)  BMI 30.12 kg/m2  Physical Examination:  General: Awake, alert, well nourished, No acute distress HEENT:  Normal Cardio: regular rate, +2 DP Pulm: normal WOB on room air, no wheeze GU: external vaginal tissue atrophic, minimal adhesion of skin at posterior introitus, scant white plaque at introitus, no herpes ulcers/lesions appreciated on exam. Skin: dry, intact, 0.5 x0.5 cm brown/tan well circumscribed raised lesion with minimal surrounding erythema.  Non bleeding.  Non exudative.  Assessment/ Plan: 65 y.o. female   1. Itching in the vaginal area.  Given duration.  I suspect lichen sclerosis.  Patient does not wish to be started on clobetasol cream at this time.  She asks to referral to Gynecology for further evaluation.  Given scant white discharge , will also empirically treat for vaginal candidiasis, but suspect patient will benefit most from topical steroid. - Ambulatory referral to Gynecology - fluconazole (DIFLUCAN) 150 MG tablet; Take 1 tablet (150 mg total) by mouth every 3 (three) days.  Dispense: 2 tablet; Refill: 0  2. HSV-2 (herpes simplex virus 2) infection - Acyclovir discontinued. - valACYclovir (VALTREX) 500 MG tablet; Take 1 tablet (500 mg total) by mouth 2 (two) times daily. x3 days during outbreaks.  Dispense: 30 tablet; Refill: 0 - Patient instructed to follow up if having frequent outbreaks, will start suppressive therapy at that time  3. Seborrheic keratoses, inflamed - Offered cryotherapy.  Patient wishes to see her personal dermatologist for removal. - Ambulatory referral to Dermatology  4.  Exposure to HIV/ Screening for human immunodeficiency virus.  Reviewed negative HIV result 2014 w/ patient. - HIV antibody - Will call with results - Follow up prn  Janora Norlander, DO PGY-2, [place]

## 2016-03-01 LAB — HIV ANTIBODY (ROUTINE TESTING W REFLEX): HIV: NONREACTIVE

## 2016-03-02 ENCOUNTER — Encounter: Payer: Self-pay | Admitting: Family Medicine

## 2016-03-12 ENCOUNTER — Other Ambulatory Visit: Payer: Self-pay | Admitting: Internal Medicine

## 2016-03-18 ENCOUNTER — Ambulatory Visit (INDEPENDENT_AMBULATORY_CARE_PROVIDER_SITE_OTHER): Payer: Medicare Other | Admitting: Psychology

## 2016-03-18 DIAGNOSIS — F330 Major depressive disorder, recurrent, mild: Secondary | ICD-10-CM | POA: Diagnosis not present

## 2016-03-18 NOTE — Progress Notes (Signed)
   THERAPIST PROGRESS NOTE  Session Time: 2pm-2.47pm  Participation Level: Active  Behavioral Response: Well GroomedAlertAFFECT WNL  Type of Therapy: Individual Therapy  Treatment Goals addressed: Diagnosis: MDD and goal 1.  Interventions: CBT and Supportive  Summary: [name] is a 65 y.o. female who presents with affect WNl.  Pt reports that she is doing well- mood has been good.  Pt reports coping well w/ grief and acknowledges that will always miss sister and mom.  Pt reports that her step daughter is terminally ill- just finding out this past week.  Pt also reports stressors of needing dental work and struggling to find [employer] provider.  Pt reports that she is looking forward to family vacation to beach in July.  Pt also reports that she wants to move back to [place]- new apartments being build and she is researching this as an option.   Suicidal/Homicidal: Nowithout intent/plan  Therapist Response: Assessed pt current functioning per pt report.  Explored w/pt her mood and recent stressors.  Discussed her goals for counseling and personal goals- moving.  Encouraged pt to follow through on researching this with daughter support.     Plan: Return again in 1.5 months. Speak w/ PCPand/or past oral surgeon for referral to dentist who accepts her insurance.   Diagnosis: MDD    Jan Fireman, [facility] 03/18/2016

## 2016-03-26 ENCOUNTER — Telehealth: Payer: Self-pay | Admitting: *Deleted

## 2016-03-26 NOTE — Telephone Encounter (Signed)
Pt needs an authorization letter for eye doctor. [facility]. Deseree Kennon Holter, CMA

## 2016-03-26 NOTE — Telephone Encounter (Signed)
Does she need a referral?  I am not sure what an authorization letter is.

## 2016-03-27 ENCOUNTER — Encounter: Payer: Self-pay | Admitting: Obstetrics and Gynecology

## 2016-03-31 DIAGNOSIS — H04123 Dry eye syndrome of bilateral lacrimal glands: Secondary | ICD-10-CM | POA: Diagnosis not present

## 2016-04-02 ENCOUNTER — Other Ambulatory Visit: Payer: Self-pay | Admitting: Family Medicine

## 2016-04-07 DIAGNOSIS — H1089 Other conjunctivitis: Secondary | ICD-10-CM | POA: Diagnosis not present

## 2016-04-11 ENCOUNTER — Other Ambulatory Visit: Payer: Self-pay | Admitting: Internal Medicine

## 2016-04-11 NOTE — Telephone Encounter (Signed)
Has this been followed up on?

## 2016-04-14 ENCOUNTER — Other Ambulatory Visit: Payer: Self-pay | Admitting: Family Medicine

## 2016-04-14 DIAGNOSIS — I10 Essential (primary) hypertension: Secondary | ICD-10-CM

## 2016-04-14 NOTE — Telephone Encounter (Signed)
Spoke with patient she just needs a referral placed for her upcoming appt.  Will forward to MD to place this referral. Johnney Ou

## 2016-04-14 NOTE — Telephone Encounter (Signed)
Done

## 2016-04-15 NOTE — Telephone Encounter (Signed)
Called pt, LM to inform her the referral has been placed.

## 2016-04-18 ENCOUNTER — Telehealth: Payer: Self-pay | Admitting: Internal Medicine

## 2016-04-18 NOTE — Telephone Encounter (Signed)
Pt states she is having problems with a bad stomach ache and lot of terrible gas pains since Monday. Pt has just now tried gas-x. Let pt know the 1st available appt would be 04/30/16 and that if she needed to be seen sooner than that she should contact her PCP or an urgent care. Pt verbalized understanding.

## 2016-04-24 ENCOUNTER — Ambulatory Visit: Payer: Medicare Other | Admitting: Obstetrics and Gynecology

## 2016-04-24 NOTE — Progress Notes (Signed)
Patient not seen by a provider today

## 2016-05-05 ENCOUNTER — Ambulatory Visit (HOSPITAL_COMMUNITY): Payer: Self-pay | Admitting: Psychology

## 2016-05-14 ENCOUNTER — Ambulatory Visit (HOSPITAL_COMMUNITY): Payer: Self-pay | Admitting: Psychiatry

## 2016-05-19 DIAGNOSIS — H2513 Age-related nuclear cataract, bilateral: Secondary | ICD-10-CM | POA: Diagnosis not present

## 2016-05-22 ENCOUNTER — Ambulatory Visit (HOSPITAL_COMMUNITY): Payer: Self-pay | Admitting: Psychology

## 2016-05-27 ENCOUNTER — Other Ambulatory Visit (HOSPITAL_COMMUNITY): Payer: Self-pay | Admitting: Psychiatry

## 2016-05-27 DIAGNOSIS — F330 Major depressive disorder, recurrent, mild: Secondary | ICD-10-CM

## 2016-05-28 ENCOUNTER — Other Ambulatory Visit: Payer: Self-pay | Admitting: Internal Medicine

## 2016-05-30 ENCOUNTER — Other Ambulatory Visit: Payer: Self-pay | Admitting: *Deleted

## 2016-05-30 MED ORDER — TOPIRAMATE 25 MG PO TABS: 25.0000 mg | ORAL_TABLET | Freq: Two times a day (BID) | ORAL | 5 refills | Status: DC

## 2016-05-31 ENCOUNTER — Other Ambulatory Visit: Payer: Self-pay | Admitting: Family Medicine

## 2016-05-31 DIAGNOSIS — N898 Other specified noninflammatory disorders of vagina: Secondary | ICD-10-CM

## 2016-06-02 ENCOUNTER — Other Ambulatory Visit: Payer: Self-pay | Admitting: *Deleted

## 2016-06-02 ENCOUNTER — Other Ambulatory Visit: Payer: Self-pay | Admitting: Internal Medicine

## 2016-06-02 ENCOUNTER — Telehealth: Payer: Self-pay | Admitting: Internal Medicine

## 2016-06-02 DIAGNOSIS — N898 Other specified noninflammatory disorders of vagina: Secondary | ICD-10-CM

## 2016-06-02 MED ORDER — LINACLOTIDE 145 MCG PO CAPS: 145.0000 ug | ORAL_CAPSULE | Freq: Every day | ORAL | 0 refills | Status: DC

## 2016-06-02 MED ORDER — MONTELUKAST SODIUM 10 MG PO TABS: 10.0000 mg | ORAL_TABLET | Freq: Every day | ORAL | 3 refills | Status: DC

## 2016-06-02 MED ORDER — RANITIDINE HCL 150 MG PO TABS: 150.0000 mg | ORAL_TABLET | Freq: Two times a day (BID) | ORAL | 0 refills | Status: DC

## 2016-06-02 NOTE — Telephone Encounter (Signed)
Rx sent 

## 2016-06-03 ENCOUNTER — Other Ambulatory Visit: Payer: Self-pay | Admitting: *Deleted

## 2016-06-03 ENCOUNTER — Other Ambulatory Visit: Payer: Self-pay | Admitting: Family Medicine

## 2016-06-03 DIAGNOSIS — N898 Other specified noninflammatory disorders of vagina: Secondary | ICD-10-CM

## 2016-06-03 DIAGNOSIS — J302 Other seasonal allergic rhinitis: Secondary | ICD-10-CM

## 2016-06-03 MED ORDER — FLUTICASONE PROPIONATE 50 MCG/ACT NA SUSP: 2.0000 | Freq: Every day | NASAL | 6 refills | Status: DC

## 2016-06-10 ENCOUNTER — Ambulatory Visit: Payer: Self-pay | Admitting: Internal Medicine

## 2016-06-10 ENCOUNTER — Telehealth: Payer: Self-pay | Admitting: Internal Medicine

## 2016-06-10 NOTE — Telephone Encounter (Signed)
Noted  

## 2016-06-12 DIAGNOSIS — H1045 Other chronic allergic conjunctivitis: Secondary | ICD-10-CM | POA: Diagnosis not present

## 2016-06-15 ENCOUNTER — Other Ambulatory Visit: Payer: Self-pay | Admitting: Family Medicine

## 2016-06-15 DIAGNOSIS — N898 Other specified noninflammatory disorders of vagina: Secondary | ICD-10-CM

## 2016-06-26 ENCOUNTER — Other Ambulatory Visit (HOSPITAL_COMMUNITY): Payer: Self-pay | Admitting: Psychiatry

## 2016-06-26 DIAGNOSIS — F330 Major depressive disorder, recurrent, mild: Secondary | ICD-10-CM

## 2016-07-01 ENCOUNTER — Other Ambulatory Visit: Payer: Self-pay | Admitting: Internal Medicine

## 2016-07-03 ENCOUNTER — Ambulatory Visit (HOSPITAL_COMMUNITY): Payer: Self-pay | Admitting: Psychiatry

## 2016-07-07 ENCOUNTER — Other Ambulatory Visit: Payer: Self-pay | Admitting: Internal Medicine

## 2016-07-07 NOTE — Telephone Encounter (Signed)
Patient has not been seen since 06/2014. Patient had a recent appointment and cancelled the same day. Patient is now rescheduled her appointment and wants refills of her Linzess and ranitidine prior to her appointment. Please advise Dr. Hilarie Fredrickson if you would like me to refill?

## 2016-07-08 MED ORDER — LINACLOTIDE 145 MCG PO CAPS: 145.0000 ug | ORAL_CAPSULE | Freq: Every day | ORAL | 1 refills | Status: DC

## 2016-07-08 MED ORDER — RANITIDINE HCL 150 MG PO TABS: 150.0000 mg | ORAL_TABLET | Freq: Two times a day (BID) | ORAL | 1 refills | Status: DC

## 2016-07-08 NOTE — Telephone Encounter (Signed)
Ok to refill x 76months , and needs an appt with an APP or Dr Hilarie Fredrickson

## 2016-07-08 NOTE — Telephone Encounter (Signed)
Prescriptions sent to patient's pharmacy and informed patient to keep appt for any further refills.

## 2016-07-10 ENCOUNTER — Ambulatory Visit: Payer: Self-pay | Admitting: *Deleted

## 2016-07-14 ENCOUNTER — Ambulatory Visit (HOSPITAL_COMMUNITY): Payer: Self-pay | Admitting: Psychology

## 2016-08-01 ENCOUNTER — Ambulatory Visit (INDEPENDENT_AMBULATORY_CARE_PROVIDER_SITE_OTHER): Payer: Medicare Other | Admitting: Family Medicine

## 2016-08-01 ENCOUNTER — Encounter: Payer: Self-pay | Admitting: Family Medicine

## 2016-08-01 VITALS — BP 138/74 | HR 68 | Temp 98.5°F | Ht 62.0 in | Wt 161.8 lb

## 2016-08-01 DIAGNOSIS — Z23 Encounter for immunization: Secondary | ICD-10-CM | POA: Diagnosis not present

## 2016-08-01 DIAGNOSIS — L304 Erythema intertrigo: Secondary | ICD-10-CM

## 2016-08-01 DIAGNOSIS — J449 Chronic obstructive pulmonary disease, unspecified: Secondary | ICD-10-CM

## 2016-08-01 MED ORDER — BUDESONIDE-FORMOTEROL FUMARATE 80-4.5 MCG/ACT IN AERO: 2.0000 | INHALATION_SPRAY | Freq: Two times a day (BID) | RESPIRATORY_TRACT | 3 refills | Status: DC

## 2016-08-01 MED ORDER — ALBUTEROL SULFATE HFA 108 (90 BASE) MCG/ACT IN AERS: 2.0000 | INHALATION_SPRAY | RESPIRATORY_TRACT | 3 refills | Status: DC

## 2016-08-01 MED ORDER — CLOTRIMAZOLE 1 % EX CREA: 1.0000 "application " | TOPICAL_CREAM | Freq: Two times a day (BID) | CUTANEOUS | 1 refills | Status: DC

## 2016-08-01 NOTE — Progress Notes (Signed)
    Subjective: CC: intetrigo HPI: [name] is a 65 y.o. female presenting to clinic today for office visit. Concerns today include:  1. Intertrigo Patient notes that the rash under her pannus is flaring again.  She has been using Clobetasol cream that was prescribed for her vulva on the area daily for the last several weeks and notes that the flare is not resolving.  She denies purulence, exudate, bleeding, pain, fevers.  Notes that area appears raw.  2. COPD Patient reports compliance with Symbicort and uses Albuterol prn.  No SOB, CP, cough.  Social History Reviewed:[name] and MedHx reviewed.  Please see EMR. Health Maintenance: flu shot due  ROS: Per HPI  Objective: Office vital signs reviewed. BP 138/74   Pulse 68   Temp 98.5 F (36.9 C) (Oral)   Ht 5\' 2"  (1.575 m)   Wt 161 lb 12.8 oz (73.4 kg)   BMI 29.59 kg/m   Physical Examination:  General: Awake, alert, well nourished, No acute distress Skin: area underneath pannus is erythematous.  Skin is slightly macerated appearing.  No pustules, no purulence, no TTP.  Assessment/ Plan: 65 y.o. female   1. Intertrigo.   - Discussed with patient that Clobetasol cream is for her vulva NOT intended for her intertrigo. - clotrimazole (LOTRIMIN) 1 % cream; Apply 1 application topically 2 (two) times daily. For up to 4 weeks  Dispense: 60 g; Refill: 1 - Skin care discussed.  Weight loss recommended  2. Chronic obstructive pulmonary disease, unspecified COPD type ([name]).  After reviewing medications, patient is not refilling medications regularly enough to reflect compliance. - stressed importance of compliance with symbicort - budesonide-formoterol (SYMBICORT) 80-4.5 MCG/ACT inhaler; Inhale 2 puffs into the lungs 2 (two) times daily.  Dispense: 1 Inhaler; Refill: 3 - albuterol (PROAIR HFA) 108 (90 Base) MCG/ACT inhaler; Inhale 2 puffs into the lungs as directed.  Dispense: 1 Inhaler; Refill: 3  3. Encounter for  immunization - Flu Vaccine QUAD 36+ mos IM  Additionally, patient asks if she can increase her Clonidine.  She is being seen by a psychiatrist.  We discussed that from my standpoint, her blood pressures can tolerate increase, but that she needs to discuss this with her psychiatrist if she is feeling increased anxiety.  She voices good understanding and will discuss with provider.  Follow up prn   Janora Norlander, DO PGY-3, [facility]

## 2016-08-01 NOTE — Patient Instructions (Signed)
STOP putting clobetasol on your belly.  This medication is not for that.  I have sent in a new cream for you to use.   Intertrigo Intertrigo is a skin irritation (inflammation) that happens in warm, moist areas of the body. It happens mostly between folds of skin or where skin rubs together. HOME CARE  Keep the affected area cool and dry.  Leave the skin folds open to air.  Put cotton or linen between the folds of skin.  Avoid tight clothing.  Wear open-toed shoes or sandals.  Use powder on the affected area as told by your doctor.  Only use medicated creams or pastes as told by your doctor. GET HELP RIGHT AWAY IF:  The rash does not get better after 1 week of treatment.  The rash gets worse.  You have a fever or chills. MAKE SURE YOU:  Understand these instructions.  Will watch your condition.  Will get help right away if you are not doing well or get worse.   This information is not intended to replace advice given to you by your health care provider. Make sure you discuss any questions you have with your health care provider.   Document Released: 11/15/2010 Document Revised: 01/05/2012 Document Reviewed: 04/16/2015 Elsevier Interactive Patient Education [employer].

## 2016-08-02 ENCOUNTER — Other Ambulatory Visit (HOSPITAL_COMMUNITY): Payer: Self-pay | Admitting: Psychiatry

## 2016-08-02 DIAGNOSIS — F330 Major depressive disorder, recurrent, mild: Secondary | ICD-10-CM

## 2016-08-06 ENCOUNTER — Ambulatory Visit (HOSPITAL_COMMUNITY): Payer: Self-pay | Admitting: Psychology

## 2016-08-12 ENCOUNTER — Ambulatory Visit (INDEPENDENT_AMBULATORY_CARE_PROVIDER_SITE_OTHER): Payer: Medicare Other | Admitting: *Deleted

## 2016-08-12 ENCOUNTER — Encounter: Payer: Self-pay | Admitting: *Deleted

## 2016-08-12 VITALS — BP 190/92 | HR 65 | Temp 98.0°F | Ht 60.0 in | Wt 159.8 lb

## 2016-08-12 DIAGNOSIS — J449 Chronic obstructive pulmonary disease, unspecified: Secondary | ICD-10-CM | POA: Diagnosis present

## 2016-08-12 DIAGNOSIS — Z0000 Encounter for general adult medical examination without abnormal findings: Secondary | ICD-10-CM

## 2016-08-12 NOTE — Progress Notes (Signed)
I have reviewed this visit and discussed with Lauren Ducatte, RN, BSN, and agree with her documentation.   Lawrnce Reyez M. Courtenay Hirth, DO PGY-3, [facility]     

## 2016-08-12 NOTE — Patient Instructions (Signed)
 Fall Prevention in the Home  Falls can cause injuries. They can happen to people of all ages. There are many things you can do to make your home safe and to help prevent falls.  WHAT CAN I DO ON THE OUTSIDE OF MY HOME?  Regularly fix the edges of walkways and driveways and fix any cracks.  Remove anything that might make you trip as you walk through a door, such as a raised step or threshold.  Trim any bushes or trees on the path to your home.  Use bright outdoor lighting.  Clear any walking paths of anything that might make someone trip, such as rocks or tools.  Regularly check to see if handrails are loose or broken. Make sure that both sides of any steps have handrails.  Any raised decks and porches should have guardrails on the edges.  Have any leaves, snow, or ice cleared regularly.  Use sand or salt on walking paths during winter.  Clean up any spills in your garage right away. This includes oil or grease spills. WHAT CAN I DO IN THE BATHROOM?   Use night lights.  Install grab bars by the toilet and in the tub and shower. Do not use towel bars as grab bars.  Use non-skid mats or decals in the tub or shower.  If you need to sit down in the shower, use a plastic, non-slip stool.  Keep the floor dry. Clean up any water that spills on the floor as soon as it happens.  Remove soap buildup in the tub or shower regularly.  Attach bath mats securely with double-sided non-slip rug tape.  Do not have throw rugs and other things on the floor that can make you trip. WHAT CAN I DO IN THE BEDROOM?  Use night lights.  Make sure that you have a light by your bed that is easy to reach.  Do not use any sheets or blankets that are too big for your bed. They should not hang down onto the floor.  Have a firm chair that has side arms. You can use this for support while you get dressed.  Do not have throw rugs and other things on the floor that can make you trip. WHAT CAN I DO  IN THE KITCHEN?  Clean up any spills right away.  Avoid walking on wet floors.  Keep items that you use a lot in easy-to-reach places.  If you need to reach something above you, use a strong step stool that has a grab bar.  Keep electrical cords out of the way.  Do not use floor polish or wax that makes floors slippery. If you must use wax, use non-skid floor wax.  Do not have throw rugs and other things on the floor that can make you trip. WHAT CAN I DO WITH MY STAIRS?  Do not leave any items on the stairs.  Make sure that there are handrails on both sides of the stairs and use them. Fix handrails that are broken or loose. Make sure that handrails are as long as the stairways.  Check any carpeting to make sure that it is firmly attached to the stairs. Fix any carpet that is loose or worn.  Avoid having throw rugs at the top or bottom of the stairs. If you do have throw rugs, attach them to the floor with carpet tape.  Make sure that you have a light switch at the top of the stairs and the bottom of the   stairs. If you do not have them, ask someone to add them for you. WHAT ELSE CAN I DO TO HELP PREVENT FALLS?  Wear shoes that:  Do not have high heels.  Have rubber bottoms.  Are comfortable and fit you well.  Are closed at the toe. Do not wear sandals.  If you use a stepladder:  Make sure that it is fully opened. Do not climb a closed stepladder.  Make sure that both sides of the stepladder are locked into place.  Ask someone to hold it for you, if possible.  Clearly mark and make sure that you can see:  Any grab bars or handrails.  First and last steps.  Where the edge of each step is.  Use tools that help you move around (mobility aids) if they are needed. These include:  Canes.  Walkers.  Scooters.  Crutches.  Turn on the lights when you go into a dark area. Replace any light bulbs as soon as they burn out.  Set up your furniture so you have a clear  path. Avoid moving your furniture around.  If any of your floors are uneven, fix them.  If there are any pets around you, be aware of where they are.  Review your medicines with your doctor. Some medicines can make you feel dizzy. This can increase your chance of falling. Ask your doctor what other things that you can do to help prevent falls.   This information is not intended to replace advice given to you by your health care provider. Make sure you discuss any questions you have with your health care provider.   Document Released: 08/09/2009 Document Revised: 02/27/2015 Document Reviewed: 11/17/2014 Elsevier Interactive Patient Education 2016 [employer]  Health Maintenance, Female Adopting a healthy lifestyle and getting preventive care can go a long way to promote health and wellness. Talk with your health care provider about what schedule of regular examinations is right for you. This is a good chance for you to check in with your provider about disease prevention and staying healthy. In between checkups, there are plenty of things you can do on your own. Experts have done a lot of research about which lifestyle changes and preventive measures are most likely to keep you healthy. Ask your health care provider for more information. WEIGHT AND DIET  Eat a healthy diet  Be sure to include plenty of vegetables, fruits, low-fat dairy products, and lean protein.  Do not eat a lot of foods high in solid fats, added sugars, or salt.  Get regular exercise. This is one of the most important things you can do for your health.  Most adults should exercise for at least 150 minutes each week. The exercise should increase your heart rate and make you sweat (moderate-intensity exercise).  Most adults should also do strengthening exercises at least twice a week. This is in addition to the moderate-intensity exercise.  Maintain a healthy weight  Body mass index (BMI) is a measurement that can  be used to identify possible weight problems. It estimates body fat based on height and weight. Your health care provider can help determine your BMI and help you achieve or maintain a healthy weight.  For females 20 years of age and older:   A BMI below 18.5 is considered underweight.  A BMI of 18.5 to 24.9 is normal.  A BMI of 25 to 29.9 is considered overweight.  A BMI of 30 and above is considered obese.  Watch levels   of cholesterol and blood lipids  You should start having your blood tested for lipids and cholesterol at 66 years of age, then have this test every 5 years.  You may need to have your cholesterol levels checked more often if:  Your lipid or cholesterol levels are high.  You are older than 66 years of age.  You are at high risk for heart disease.  CANCER SCREENING   Lung Cancer  Lung cancer screening is recommended for adults 53-62 years old who are at high risk for lung cancer because of a history of smoking.  A yearly low-dose CT scan of the lungs is recommended for people who:  Currently smoke.  Have quit within the past 15 years.  Have at least a 30-pack-year history of smoking. A pack year is smoking an average of one pack of cigarettes a day for 1 year.  Yearly screening should continue until it has been 15 years since you quit.  Yearly screening should stop if you develop a health problem that would prevent you from having lung cancer treatment.  Breast Cancer  Practice breast self-awareness. This means understanding how your breasts normally appear and feel.  It also means doing regular breast self-exams. Let your health care provider know about any changes, no matter how small.  If you are in your 20s or 30s, you should have a clinical breast exam (CBE) by a health care provider every 1-3 years as part of a regular health exam.  If you are 4 or older, have a CBE every year. Also consider having a breast X-ray (mammogram) every year.  If  you have a family history of breast cancer, talk to your health care provider about genetic screening.  If you are at high risk for breast cancer, talk to your health care provider about having an MRI and a mammogram every year.  Breast cancer gene (BRCA) assessment is recommended for women who have family members with BRCA-related cancers. BRCA-related cancers include:  Breast.  Ovarian.  Tubal.  Peritoneal cancers.  Results of the assessment will determine the need for genetic counseling and BRCA1 and BRCA2 testing. Cervical Cancer Your health care provider may recommend that you be screened regularly for cancer of the pelvic organs (ovaries, uterus, and vagina). This screening involves a pelvic examination, including checking for microscopic changes to the surface of your cervix (Pap test). You may be encouraged to have this screening done every 3 years, beginning at age 75.  For women ages 5-65, health care providers may recommend pelvic exams and Pap testing every 3 years, or they may recommend the Pap and pelvic exam, combined with testing for human papilloma virus (HPV), every 5 years. Some types of HPV increase your risk of cervical cancer. Testing for HPV may also be done on women of any age with unclear Pap test results.  Other health care providers may not recommend any screening for nonpregnant women who are considered low risk for pelvic cancer and who do not have symptoms. Ask your health care provider if a screening pelvic exam is right for you.  If you have had past treatment for cervical cancer or a condition that could lead to cancer, you need Pap tests and screening for cancer for at least 20 years after your treatment. If Pap tests have been discontinued, your risk factors (such as having a new sexual partner) need to be reassessed to determine if screening should resume. Some women have medical problems that increase the chance  of getting cervical cancer. In these cases,  your health care provider may recommend more frequent screening and Pap tests. Colorectal Cancer  This type of cancer can be detected and often prevented.  Routine colorectal cancer screening usually begins at 66 years of age and continues through 66 years of age.  Your health care provider may recommend screening at an earlier age if you have risk factors for colon cancer.  Your health care provider may also recommend using home test kits to check for hidden blood in the stool.  A small camera at the end of a tube can be used to examine your colon directly (sigmoidoscopy or colonoscopy). This is done to check for the earliest forms of colorectal cancer.  Routine screening usually begins at age 50.  Direct examination of the colon should be repeated every 5-10 years through 66 years of age. However, you may need to be screened more often if early forms of precancerous polyps or small growths are found. Skin Cancer  Check your skin from head to toe regularly.  Tell your health care provider about any new moles or changes in moles, especially if there is a change in a mole's shape or color.  Also tell your health care provider if you have a mole that is larger than the size of a pencil eraser.  Always use sunscreen. Apply sunscreen liberally and repeatedly throughout the day.  Protect yourself by wearing long sleeves, pants, a wide-brimmed hat, and sunglasses whenever you are outside. HEART DISEASE, DIABETES, AND HIGH BLOOD PRESSURE   High blood pressure causes heart disease and increases the risk of stroke. High blood pressure is more likely to develop in:  People who have blood pressure in the high end of the normal range (130-139/85-89 mm Hg).  People who are overweight or obese.  People who are African American.  If you are 18-39 years of age, have your blood pressure checked every 3-5 years. If you are 40 years of age or older, have your blood pressure checked every year. You  should have your blood pressure measured twice--once when you are at a hospital or clinic, and once when you are not at a hospital or clinic. Record the average of the two measurements. To check your blood pressure when you are not at a hospital or clinic, you can use:  An automated blood pressure machine at a pharmacy.  A home blood pressure monitor.  If you are between 55 years and 79 years old, ask your health care provider if you should take aspirin to prevent strokes.  Have regular diabetes screenings. This involves taking a blood sample to check your fasting blood sugar level.  If you are at a normal weight and have a low risk for diabetes, have this test once every three years after 66 years of age.  If you are overweight and have a high risk for diabetes, consider being tested at a younger age or more often. PREVENTING INFECTION  Hepatitis B  If you have a higher risk for hepatitis B, you should be screened for this virus. You are considered at high risk for hepatitis B if:  You were born in a country where hepatitis B is common. Ask your health care provider which countries are considered high risk.  Your parents were born in a high-risk country, and you have not been immunized against hepatitis B (hepatitis B vaccine).  You have HIV or AIDS.  You use needles to inject street drugs.  You   live with someone who has hepatitis B.  You have had sex with someone who has hepatitis B.  You get hemodialysis treatment.  You take certain medicines for conditions, including cancer, organ transplantation, and autoimmune conditions. Hepatitis C  Blood testing is recommended for:  Everyone born from 1945 through 1965.  Anyone with known risk factors for hepatitis C. Sexually transmitted infections (STIs)  You should be screened for sexually transmitted infections (STIs) including gonorrhea and chlamydia if:  You are sexually active and are younger than 66 years of age.  You  are older than 66 years of age and your health care provider tells you that you are at risk for this type of infection.  Your sexual activity has changed since you were last screened and you are at an increased risk for chlamydia or gonorrhea. Ask your health care provider if you are at risk.  If you do not have HIV, but are at risk, it may be recommended that you take a prescription medicine daily to prevent HIV infection. This is called pre-exposure prophylaxis (PrEP). You are considered at risk if:  You are sexually active and do not regularly use condoms or know the HIV status of your partner(s).  You take drugs by injection.  You are sexually active with a partner who has HIV. Talk with your health care provider about whether you are at high risk of being infected with HIV. If you choose to begin PrEP, you should first be tested for HIV. You should then be tested every 3 months for as long as you are taking PrEP.  PREGNANCY   If you are premenopausal and you may become pregnant, ask your health care provider about preconception counseling.  If you may become pregnant, take 400 to 800 micrograms (mcg) of folic acid every day.  If you want to prevent pregnancy, talk to your health care provider about birth control (contraception). OSTEOPOROSIS AND MENOPAUSE   Osteoporosis is a disease in which the bones lose minerals and strength with aging. This can result in serious bone fractures. Your risk for osteoporosis can be identified using a bone density scan.  If you are 65 years of age or older, or if you are at risk for osteoporosis and fractures, ask your health care provider if you should be screened.  Ask your health care provider whether you should take a calcium or vitamin D supplement to lower your risk for osteoporosis.  Menopause may have certain physical symptoms and risks.  Hormone replacement therapy may reduce some of these symptoms and risks. Talk to your health care  provider about whether hormone replacement therapy is right for you.  HOME CARE INSTRUCTIONS   Schedule regular health, dental, and eye exams.  Stay current with your immunizations.   Do not use any tobacco products including cigarettes, chewing tobacco, or electronic cigarettes.  If you are pregnant, do not drink alcohol.  If you are breastfeeding, limit how much and how often you drink alcohol.  Limit alcohol intake to no more than 1 drink per day for nonpregnant women. One drink equals 12 ounces of beer, 5 ounces of wine, or 1 ounces of hard liquor.  Do not use street drugs.  Do not share needles.  Ask your health care provider for help if you need support or information about quitting drugs.  Tell your health care provider if you often feel depressed.  Tell your health care provider if you have ever been abused or do not feel safe   at home.   This information is not intended to replace advice given to you by your health care provider. Make sure you discuss any questions you have with your health care provider.   Document Released: 04/28/2011 Document Revised: 11/03/2014 Document Reviewed: 09/14/2013 Elsevier Interactive Patient Education 2016 [employer]   Hearing Loss Hearing loss is a partial or total loss of the ability to hear. This can be temporary or permanent, and it can happen in one or both ears. Hearing loss may be referred to as deafness. Medical care is necessary to treat hearing loss properly and to prevent the condition from getting worse. Your hearing may partially or completely come back, depending on what caused your hearing loss and how severe it is. In some cases, hearing loss is permanent. CAUSES Common causes of hearing loss include:   Too much wax in the ear canal.   Infection of the ear canal or middle ear.   Fluid in the middle ear.   Injury to the ear or surrounding area.   An object stuck in the ear.   Prolonged exposure to loud  sounds, such as music.  Less common causes of hearing loss include:   Tumors in the ear.   Viral or bacterial infections, such as meningitis.   A hole in the eardrum (perforated eardrum).  Problems with the hearing nerve that sends signals between the brain and the ear.  Certain medicines.  SYMPTOMS  Symptoms of this condition may include:  Difficulty telling the difference between sounds.  Difficulty following a conversation when there is background noise.  Lack of response to sounds in your environment. This may be most noticeable when you do not respond to startling sounds.  Needing to turn up the volume on the television, radio, etc.  Ringing in the ears.  Dizziness.  Pain in the ears. DIAGNOSIS This condition is diagnosed based on a physical exam and a hearing test (audiometry). The audiometry test will be performed by a hearing specialist (audiologist). You may also be referred to an ear, nose, and throat (ENT) specialist (otolaryngologist).  TREATMENT Treatment for recent onset of hearing loss may include:   Ear wax removal.   Being prescribed medicines to prevent infection (antibiotics).   Being prescribed medicines to reduce inflammation (corticosteroids).  HOME CARE INSTRUCTIONS  If you were prescribed an antibiotic medicine, take it as told by your health care provider. Do not stop taking the antibiotic even if you start to feel better.  Take over-the-counter and prescription medicines only as told by your health care provider.  Avoid loud noises.   Return to your normal activities as told by your health care provider. Ask your health care provider what activities are safe for you.  Keep all follow-up visits as told by your health care provider. This is important. SEEK MEDICAL CARE IF:   You feel dizzy.   You develop new symptoms.   You vomit or feel nauseous.   You have a fever.  SEEK IMMEDIATE MEDICAL CARE IF:  You develop sudden  changes in your vision.   You have severe ear pain.   You have new or increased weakness.  You have a severe headache.   This information is not intended to replace advice given to you by your health care provider. Make sure you discuss any questions you have with your health care provider.   Document Released: 10/13/2005 Document Revised: 07/04/2015 Document Reviewed: 02/28/2015 Elsevier Interactive Patient Education 2016 [employer]  

## 2016-08-12 NOTE — Progress Notes (Signed)
Subjective:   [name] is a 65 y.o. female who presents for an Initial Medicare Annual Wellness Visit.   Cardiac Risk Factors include: advanced age (>64men, >24 women);dyslipidemia;hypertension;sedentary lifestyle;smoking/ tobacco exposure;obesity (BMI >30kg/m2)     Objective:    Today's Vitals   08/12/16 1428 08/12/16 1429  BP:  (!) 191/84  Pulse:  65  Temp:  98 F (36.7 C)  TempSrc:  Oral  SpO2:  98%  Weight: 159 lb 12.8 oz (72.5 kg)   Height: 5' (1.524 m)   PainSc:  5    Body mass index is 31.21 kg/m.  BP recheck 190/92  left arm manually with adult cuff. Patient denies headache, CP, SHOB or visual changes. Discussed with Preceptor, Dr. McDiarmid, and patient ok to be discharged to home. Patient to schedule appt with PCP to discuss elevated BPs.  Current Medications (verified) Outpatient Encounter Prescriptions as of 08/12/2016  Medication Sig  . albuterol (PROAIR HFA) 108 (90 Base) MCG/ACT inhaler Inhale 2 puffs into the lungs as directed.  [name] atorvastatin (LIPITOR) 40 MG tablet Take 1 tablet (40 mg total) by mouth daily.  . budesonide-formoterol (SYMBICORT) 80-4.5 MCG/ACT inhaler Inhale 2 puffs into the lungs 2 (two) times daily.  [name] buPROPion (WELLBUTRIN XL) 300 MG 24 hr tablet TAKE 1 TABLET(300 MG) BY MOUTH DAILY  . cetirizine (ZYRTEC) 10 MG tablet Take 1 tablet (10 mg total) by mouth daily.  . Cholecalciferol (VITAMIN D3) 50000 UNITS CAPS Take 1 capsule by mouth once a week.  . clobetasol ointment (TEMOVATE) 0.05 % APPLY SMALL AMOUNT WITH FINGERTIP TO VULVA EVERY NIGHT AT BEDTIME.  . cloNIDine (CATAPRES) 0.3 MG tablet TAKE 1 AND 1/2 TABLETS BY MOUTH AT BEDTIME  . clotrimazole (LOTRIMIN) 1 % cream Apply 1 application topically 2 (two) times daily. For up to 4 weeks  . fluticasone (FLONASE) 50 MCG/ACT nasal spray Place 2 sprays into both nostrils daily.  . hydrOXYzine (VISTARIL) 25 MG capsule TAKE 1 CAPSULE(25 MG) BY MOUTH DAILY AS NEEDED FOR ANXIETY    . linaclotide (LINZESS) 145 MCG CAPS capsule Take 1 capsule (145 mcg total) by mouth daily.  [name] lisinopril-hydrochlorothiazide (PRINZIDE,ZESTORETIC) 10-12.5 MG per tablet TAKE 1 TABLET BY MOUTH DAILY  . mirtazapine (REMERON) 30 MG tablet Take 1 tablet (30 mg total) by mouth at bedtime.  . montelukast (SINGULAIR) 10 MG tablet Take 1 tablet (10 mg total) by mouth at bedtime.  . potassium chloride SA (K-DUR,KLOR-CON) 20 MEQ tablet TAKE 1 TABLET BY MOUTH DAILY  . ranitidine (ZANTAC) 150 MG tablet Take 1 tablet (150 mg total) by mouth 2 (two) times daily.  [name] topiramate (TOPAMAX) 25 MG tablet Take 1 tablet (25 mg total) by mouth 2 (two) times daily.  . valACYclovir (VALTREX) 500 MG tablet Take 1 tablet (500 mg total) by mouth 2 (two) times daily. x3 days.  [name] gabapentin (NEURONTIN) 100 MG capsule TAKE 1 CAPSULE(100 MG) BY MOUTH THREE TIMES DAILY (Patient not taking: Reported on 08/12/2016)  . Spacer/Aero-Holding Chambers (AEROCHAMBER PLUS FLO-VU LARGE) MISC 1 each by Other route once. (Patient not taking: Reported on 08/12/2016)  . [DISCONTINUED] cloNIDine (CATAPRES) 0.3 MG tablet TAKE 1 AND 1/2 TABLETS BY MOUTH EVERY NIGHT AT BEDTIME (Patient not taking: Reported on 08/12/2016)  . [DISCONTINUED] fluconazole (DIFLUCAN) 150 MG tablet Take 1 tablet (150 mg total) by mouth every 3 (three) days. (Patient not taking: Reported on 08/12/2016)  . [DISCONTINUED] mirtazapine (REMERON) 30 MG tablet TAKE 1 TABLET BY MOUTH AT BEDTIME (Patient  not taking: Reported on 08/12/2016)  . [DISCONTINUED] Olopatadine HCl 0.2 % SOLN Apply 1 drop to eye daily. (Patient not taking: Reported on 08/12/2016)  . [DISCONTINUED] predniSONE (DELTASONE) 50 MG tablet Take 1 tablet (50 mg total) by mouth daily with breakfast. x5 days (Patient not taking: Reported on 08/12/2016)  . [DISCONTINUED] Vitamin D, Ergocalciferol, (DRISDOL) 50000 UNITS CAPS capsule TAKE 1 CAPSULE BY MOUTH ONCE A WEEK (Patient not taking: Reported on 08/12/2016)   No  facility-administered encounter medications on file as of 08/12/2016.   Patient was unable to afford aerochamber at local pharmacy. Patient given aerochamber and instructed on use.  Allergies (verified) Aspirin; Penicillins; and Latex   History: Past Medical History:  Diagnosis Date  . Adenomatous colon polyp   . Anxiety   . Asthma   . Back pain with radiation    radiopathy   . Carpal tunnel syndrome 10/27/2010  . COPD (chronic obstructive pulmonary disease) ([place])   . Corneal abrasion, left 04/28/2013  . Depression   . GERD (gastroesophageal reflux disease)   . Hx of migraines   . Hyperlipidemia   . Hypertension   . Nicotine addiction   . PANIC ATTACK 11/05/2007  . PELVIC  PAIN 08/13/2010  . Urinary incontinence    Past Surgical History:  Procedure Laterality Date  . bleeding ulcer repair  1992   Open Ex Lap with perforation of duodenal ulcer with pertitonitis  . CHOLECYSTECTOMY  1999  . double mastectomy  June 04, 2010  . HIATAL HERNIA REPAIR  1967  . left ankle surgery  2008  . removal of mild ducts from left breast  1982  . TOTAL ABDOMINAL HYSTERECTOMY W/ BILATERAL SALPINGOOPHORECTOMY  1989  . TUBAL LIGATION  1975, 1978   x 2   Family History  Problem Relation Age of Onset  . Heart disease Mother   . Diabetes Mother   . Lupus Mother   . Colon polyps Mother   . Tuberculosis Father   . Depression Sister   . Diabetes Brother   . Colon polyps Maternal Aunt   . Heart disease Maternal Grandmother   . Heart attack Maternal Aunt   . Stomach cancer Sister   . Endometriosis Daughter   . Diabetes Daughter   . Colon cancer Neg Hx   . Esophageal cancer Neg Hx   . Pancreatic cancer Neg Hx   . Rectal cancer Neg Hx    Social History   Occupational History  . unemployed     Social History Main Topics  . Smoking status: Former Smoker    Packs/day: 0.25    Years: 42.00    Types: Cigarettes    Quit date: 08/26/2015  . Smokeless tobacco: Never Used  . Alcohol use No    . Drug use: No  . Sexual activity: No   Tobacco Counseling Patient is former smoker with no plans to restart  Activities of Daily Living In your present state of health, do you have any difficulty performing the following activities: 08/12/2016  Hearing? N  Vision? N  Difficulty concentrating or making decisions? N  Walking or climbing stairs? Y  Dressing or bathing? N  Doing errands, shopping? N  Preparing Food and eating ? N  Using the Toilet? N  In the past six months, have you accidently leaked urine? N  Do you have problems with loss of bowel control? Y  Managing your Medications? N  Managing your Finances? N  Housekeeping or managing your Housekeeping? N  Some  recent data might be hidden  Home Safety:  My home has a working smoke alarm:  Yes X 1           My home throw rugs have been fastened down to the floor or removed:  Fastened down I have non-slip mats in the bathtub and shower:  Yes         All my home's stairs have railings or bannisters: 4th floor apt with elevator. Patient chooses to use stairs which have railings        My home's floors, stairs and hallways are free from clutter, wires and cords:  Yes     I wear seatbelts consistently:  Yes    Immunizations and Health Maintenance Immunization History  Administered Date(s) Administered  . H1N1 09/28/2008  . Influenza Split 09/13/2012  . Influenza Whole 09/16/2005, 10/03/2007, 08/08/2009, 08/13/2010  . Influenza,inj,Quad PF,36+ Mos 07/07/2013, 10/02/2014, 11/26/2015, 08/01/2016  . Pneumococcal Conjugate-13 11/26/2015  . Pneumococcal Polysaccharide-23 06/02/2004  . Td 06/12/2004   Health Maintenance Due  Topic Date Due  . TETANUS/TDAP  06/12/2014  . DEXA SCAN  05/09/2015  [facility] Dexa Scan. Contact info given. Patient will obtain TDaP at local pharmacy   Patient Care Team: Janora Norlander, DO as PCP - General Rutherford Guys, MD as Consulting Physician  (Ophthalmology) Jerene Bears, MD as Consulting Physician (Gastroenterology) Kathlee Nations, MD as Consulting Physician (Psychiatry)  Indicate any recent Medical Services you may have received from other than [facility] providers in the past year (date may be approximate).     Assessment:   This is a routine wellness examination for [name].   Hearing/Vision screen  Hearing Screening   Method: Audiometry   125Hz  250Hz  500Hz  1000Hz  2000Hz  3000Hz  4000Hz  6000Hz  8000Hz   Right ear:   40 40 40  40    Left ear:   Fail Fail 40  Fail      Dietary issues and exercise activities discussed: Current Exercise Habits: The patient does not participate in regular exercise at present, Exercise limited by: psychological condition(s) (Appt on 08/18/16 with Psychiatrist)  Goals    . Blood Pressure < 140/90      Depression Screen PHQ 2/9 Scores 08/12/2016 08/01/2016 02/29/2016 01/01/2016 11/26/2015 05/31/2015 05/17/2015  PHQ - 2 Score 2 0 0 0 0 0 0  PHQ- 9 Score 6 - - - - - -  Patient is being followed by Psychiatrist. Next appt 08/18/16.   Fall Risk Fall Risk  08/12/2016 08/01/2016 01/01/2016 11/26/2015 03/13/2015  Falls in the past year? No No Yes No No   TUG Test:  Done in 10 seconds.  Cognitive Function: Mini-Cog  Passed with score 4/5  Screening Tests Health Maintenance  Topic Date Due  . TETANUS/TDAP  06/12/2014  . DEXA SCAN  05/09/2015  . PNA vac Low Risk Adult (2 of 2 - PPSV23) 11/25/2016  . COLONOSCOPY  08/08/2019  . INFLUENZA VACCINE  Completed  . ZOSTAVAX  Completed  . Hepatitis C Screening  Completed      Plan:   Patient instructed to schedule appt with PCP to discuss elevated BPs  Patient rating mouth pain at 5/10 for > 1 year. States "teeth are falling out." Given list of dentists who take MCD with contact info.  Patient has recent RX for eye glasses but has not bought eye glasses due to cost. Given list of eye centers who take [facility]. Also, advised that [facility] may have best cost  on eye  glasses.  Patient having difficulty with transportation and living arrangements. Would prefer to live in [facility]. Given resources for [employer] and [facility].  During the course of the visit, [name] was educated and counseled about the following appropriate screening and preventive services:   Vaccines to include Pneumoccal, Influenza, Td, Zostavax  Cardiovascular disease screening  Colorectal cancer screening  Bone density screening  Diabetes screening  Mammography/PAP  Nutrition counseling  Patient Instructions (the written plan) were given to the patient.    Velora Heckler, RN   08/12/2016

## 2016-08-18 ENCOUNTER — Ambulatory Visit (HOSPITAL_COMMUNITY): Payer: Self-pay | Admitting: Psychiatry

## 2016-08-25 ENCOUNTER — Other Ambulatory Visit (HOSPITAL_COMMUNITY): Payer: Self-pay | Admitting: Psychiatry

## 2016-08-25 ENCOUNTER — Other Ambulatory Visit: Payer: Self-pay | Admitting: *Deleted

## 2016-08-25 DIAGNOSIS — L304 Erythema intertrigo: Secondary | ICD-10-CM

## 2016-08-25 DIAGNOSIS — B009 Herpesviral infection, unspecified: Secondary | ICD-10-CM

## 2016-08-25 DIAGNOSIS — F330 Major depressive disorder, recurrent, mild: Secondary | ICD-10-CM

## 2016-08-25 DIAGNOSIS — J302 Other seasonal allergic rhinitis: Secondary | ICD-10-CM

## 2016-08-25 DIAGNOSIS — N898 Other specified noninflammatory disorders of vagina: Secondary | ICD-10-CM

## 2016-08-25 DIAGNOSIS — J449 Chronic obstructive pulmonary disease, unspecified: Secondary | ICD-10-CM

## 2016-08-25 MED ORDER — RANITIDINE HCL 150 MG PO TABS: 150.0000 mg | ORAL_TABLET | Freq: Two times a day (BID) | ORAL | 0 refills | Status: DC

## 2016-08-25 MED ORDER — CLONIDINE HCL 0.3 MG PO TABS: ORAL_TABLET | ORAL | 3 refills | Status: DC

## 2016-08-25 MED ORDER — VALACYCLOVIR HCL 500 MG PO TABS: 500.0000 mg | ORAL_TABLET | Freq: Two times a day (BID) | ORAL | 0 refills | Status: DC

## 2016-08-25 MED ORDER — FLUTICASONE PROPIONATE 50 MCG/ACT NA SUSP: 2.0000 | Freq: Every day | NASAL | 6 refills | Status: DC

## 2016-08-25 MED ORDER — GABAPENTIN 100 MG PO CAPS: ORAL_CAPSULE | ORAL | 4 refills | Status: DC

## 2016-08-25 MED ORDER — BUDESONIDE-FORMOTEROL FUMARATE 80-4.5 MCG/ACT IN AERO: 2.0000 | INHALATION_SPRAY | Freq: Two times a day (BID) | RESPIRATORY_TRACT | 3 refills | Status: DC

## 2016-08-25 MED ORDER — LINACLOTIDE 145 MCG PO CAPS: 145.0000 ug | ORAL_CAPSULE | Freq: Every day | ORAL | 0 refills | Status: DC

## 2016-08-25 MED ORDER — CLOTRIMAZOLE 1 % EX CREA: 1.0000 "application " | TOPICAL_CREAM | Freq: Two times a day (BID) | CUTANEOUS | 1 refills | Status: DC

## 2016-08-27 ENCOUNTER — Other Ambulatory Visit (HOSPITAL_COMMUNITY): Payer: Self-pay | Admitting: Psychiatry

## 2016-08-27 DIAGNOSIS — F330 Major depressive disorder, recurrent, mild: Secondary | ICD-10-CM

## 2016-08-28 ENCOUNTER — Other Ambulatory Visit: Payer: Self-pay | Admitting: *Deleted

## 2016-08-28 ENCOUNTER — Other Ambulatory Visit (HOSPITAL_COMMUNITY): Payer: Self-pay | Admitting: Psychiatry

## 2016-08-28 DIAGNOSIS — N898 Other specified noninflammatory disorders of vagina: Secondary | ICD-10-CM

## 2016-09-02 ENCOUNTER — Ambulatory Visit: Payer: Self-pay | Admitting: Family Medicine

## 2016-09-02 DIAGNOSIS — H2513 Age-related nuclear cataract, bilateral: Secondary | ICD-10-CM | POA: Diagnosis not present

## 2016-09-02 DIAGNOSIS — Z880 Allergy status to penicillin: Secondary | ICD-10-CM | POA: Diagnosis not present

## 2016-09-02 DIAGNOSIS — Z91040 Latex allergy status: Secondary | ICD-10-CM | POA: Diagnosis not present

## 2016-09-02 DIAGNOSIS — F17210 Nicotine dependence, cigarettes, uncomplicated: Secondary | ICD-10-CM | POA: Diagnosis not present

## 2016-09-02 DIAGNOSIS — I10 Essential (primary) hypertension: Secondary | ICD-10-CM | POA: Diagnosis not present

## 2016-09-02 DIAGNOSIS — L858 Other specified epidermal thickening: Secondary | ICD-10-CM | POA: Diagnosis not present

## 2016-09-02 DIAGNOSIS — Z886 Allergy status to analgesic agent status: Secondary | ICD-10-CM | POA: Diagnosis not present

## 2016-09-04 ENCOUNTER — Ambulatory Visit (INDEPENDENT_AMBULATORY_CARE_PROVIDER_SITE_OTHER): Payer: Medicare Other | Admitting: Family Medicine

## 2016-09-04 ENCOUNTER — Encounter: Payer: Self-pay | Admitting: Family Medicine

## 2016-09-04 VITALS — BP 152/78 | HR 61 | Temp 98.0°F | Ht 60.0 in | Wt 157.0 lb

## 2016-09-04 DIAGNOSIS — F17200 Nicotine dependence, unspecified, uncomplicated: Secondary | ICD-10-CM

## 2016-09-04 DIAGNOSIS — I10 Essential (primary) hypertension: Secondary | ICD-10-CM | POA: Diagnosis not present

## 2016-09-04 DIAGNOSIS — R5383 Other fatigue: Secondary | ICD-10-CM

## 2016-09-04 LAB — COMPLETE METABOLIC PANEL WITHOUT GFR
ALT: 11 U/L (ref 6–29)
AST: 13 U/L (ref 10–35)
Albumin: 4.2 g/dL (ref 3.6–5.1)
Alkaline Phosphatase: 96 U/L (ref 33–130)
BUN: 6 mg/dL — ABNORMAL LOW (ref 7–25)
CO2: 23 mmol/L (ref 20–31)
Calcium: 9.6 mg/dL (ref 8.6–10.4)
Chloride: 108 mmol/L (ref 98–110)
Creat: 1.07 mg/dL — ABNORMAL HIGH (ref 0.50–0.99)
GFR, Est African American: 63 mL/min
GFR, Est Non African American: 54 mL/min — ABNORMAL LOW
Glucose, Bld: 101 mg/dL — ABNORMAL HIGH (ref 65–99)
Potassium: 3.4 mmol/L — ABNORMAL LOW (ref 3.5–5.3)
Sodium: 143 mmol/L (ref 135–146)
Total Bilirubin: 0.8 mg/dL (ref 0.2–1.2)
Total Protein: 7 g/dL (ref 6.1–8.1)

## 2016-09-04 LAB — TSH: TSH: 0.56 m[IU]/L

## 2016-09-04 LAB — LIPID PANEL
CHOLESTEROL: 219 mg/dL — AB (ref <200)
HDL: 40 mg/dL — AB (ref >50)
LDL CALC: 153 mg/dL — AB
TRIGLYCERIDES: 132 mg/dL (ref <150)
Total CHOL/HDL Ratio: 5.5 Ratio — ABNORMAL HIGH (ref <5.0)
VLDL: 26 mg/dL (ref <30)

## 2016-09-04 MED ORDER — NICOTINE 7 MG/24HR TD PT24: 7.0000 mg | MEDICATED_PATCH | Freq: Every day | TRANSDERMAL | 0 refills | Status: DC

## 2016-09-04 MED ORDER — HYDROCHLOROTHIAZIDE 12.5 MG PO TABS: 12.5000 mg | ORAL_TABLET | Freq: Every day | ORAL | 0 refills | Status: DC

## 2016-09-04 NOTE — Patient Instructions (Addendum)
Bring your blood pressure machine to your next visit.  I want you checking your blood each day around the same time.  Come in next week for a nurses blood pressure check.  I will contact you will the results of your labs.  If anything is abnormal, I will call you.  Otherwise, expect a copy to be mailed to you.   I have sent in the blood pressure medication to your pharmacy.  Make sure to take this in the morning time.   Hypertension Hypertension is another name for high blood pressure. High blood pressure forces your heart to work harder to pump blood. A blood pressure reading has two numbers, which includes a higher number over a lower number (example: 110/72). HOME CARE   Have your blood pressure rechecked by your doctor.  Only take medicine as told by your doctor. Follow the directions carefully. The medicine does not work as well if you skip doses. Skipping doses also puts you at risk for problems.  Do not smoke.  Monitor your blood pressure at home as told by your doctor. GET HELP IF:  You think you are having a reaction to the medicine you are taking.  You have repeat headaches or feel dizzy.  You have puffiness (swelling) in your ankles.  You have trouble with your vision. GET HELP RIGHT AWAY IF:   You get a very bad headache and are confused.  You feel weak, numb, or faint.  You get chest or belly (abdominal) pain.  You throw up (vomit).  You cannot breathe very well. MAKE SURE YOU:   Understand these instructions.  Will watch your condition.  Will get help right away if you are not doing well or get worse.   This information is not intended to replace advice given to you by your health care provider. Make sure you discuss any questions you have with your health care provider.   Document Released: 03/31/2008 Document Revised: 10/18/2013 Document Reviewed: 08/05/2013 Elsevier Interactive Patient Education [employer].

## 2016-09-04 NOTE — Progress Notes (Signed)
    Subjective: CC: HTN HPI: [name] is a 65 y.o. female presenting to clinic today for follow up. Concerns today include:  1. Hypertension Blood pressure at last visit with SBP 190s.  She notes that is not checking her BP at home.  Does not add salt to food.  She notes significant soda intake.  She reports picky eating.  Scant exercise. Blood pressure today: 152/78 Meds: Compliant with Clonidine 0.3mg .  Was not aware that she was supposed to be on Lisinopril/HCTZ and has not taken in >1 year ROS: Denies headache, dizziness, visual changes, nausea, vomiting, chest pain, abdominal pain or shortness of breath.  2. Tobacco use disorder Smokes 6 cigs/ day.  She has a day surgery scheduled for next week for a skin lesion near her eye.  She is stopping today.  She plans to stay quit.  Social History Reviewed: active smoker 6 cigs per day [place]. FamHx and MedHx reviewed.  Please see EMR.  ROS: Per HPI  Objective: Office vital signs reviewed. BP (!) 152/78   Pulse 61   Temp 98 F (36.7 C) (Oral)   Ht 5' (1.524 m)   Wt 157 lb (71.2 kg)   LMP  (Approximate) Comment: 1982  BMI 30.66 kg/m   Physical Examination:  General: Awake, alert, well nourished, No acute distress HEENT: Normal    Neck: No masses palpated. No lymphadenopathy    Ears: Tympanic membranes intact, normal light reflex, no erythema, no bulging    Eyes: PERRLA, EOMI    Nose: nasal turbinates moist    Throat: moist mucus membranes, no erythema Cardio: regular rate and rhythm, S1S2 heard, no murmurs appreciated Pulm: clear to auscultation bilaterally, no wheezes, rhonchi or rales Extremities: warm, well perfused, No edema, cyanosis or clubbing; +2 pulses bilaterally  Assessment/ Plan: 65 y.o. female   1. Essential hypertension, benign. BP elevated systolics 0000000 which is above goal for age.  Will obtain metabolic labs - COMPLETE METABOLIC PANEL WITH GFR - Lipid panel - hydrochlorothiazide  (HYDRODIURIL) 12.5 MG tablet; Take 1 tablet (12.5 mg total) by mouth daily.  Dispense: 30 tablet; Refill: 0 - Will contact with labs - Smoking cessation as below - Follow up next week for RN BP check if persistently elevated will increase HCTZ to 25mg  - Recommended soda reduction  2. NICOTINE ADDICTION - Nicotine patches sent into pharmacy  3. Fatigue, unspecified type - TSH  Follow up in 3 weeks for BP  Ashly Windell Moulding, DO PGY-3, [facility]

## 2016-09-05 ENCOUNTER — Encounter: Payer: Self-pay | Admitting: Family Medicine

## 2016-09-11 ENCOUNTER — Other Ambulatory Visit: Payer: Self-pay | Admitting: Family Medicine

## 2016-09-11 DIAGNOSIS — I10 Essential (primary) hypertension: Secondary | ICD-10-CM

## 2016-09-24 ENCOUNTER — Encounter (HOSPITAL_COMMUNITY): Payer: Self-pay | Admitting: Psychiatry

## 2016-09-24 ENCOUNTER — Ambulatory Visit (INDEPENDENT_AMBULATORY_CARE_PROVIDER_SITE_OTHER): Payer: Medicare Other | Admitting: Psychiatry

## 2016-09-24 DIAGNOSIS — F330 Major depressive disorder, recurrent, mild: Secondary | ICD-10-CM

## 2016-09-24 DIAGNOSIS — F419 Anxiety disorder, unspecified: Secondary | ICD-10-CM | POA: Diagnosis not present

## 2016-09-24 DIAGNOSIS — Z79899 Other long term (current) drug therapy: Secondary | ICD-10-CM | POA: Diagnosis not present

## 2016-09-24 MED ORDER — BUPROPION HCL ER (XL) 300 MG PO TB24: ORAL_TABLET | ORAL | 0 refills | Status: DC

## 2016-09-24 MED ORDER — MIRTAZAPINE 30 MG PO TABS: 30.0000 mg | ORAL_TABLET | Freq: Every day | ORAL | 0 refills | Status: DC

## 2016-09-24 MED ORDER — HYDROXYZINE PAMOATE 25 MG PO CAPS: ORAL_CAPSULE | ORAL | 0 refills | Status: DC

## 2016-09-24 NOTE — Progress Notes (Signed)
[place] Progress Note  [name] [id number] 65 y.o.  09/24/2016 2:08 PM  Chief Complaint:  I had a good Thanksgiving.           History of Present Illness:   [name] came for her followup appointment.  She had a good Thanksgiving .  She spent time with her daughter who recently bought a house in [place].  She's been noncompliant with Vistaril for past few days and cooperating of poor sleep otherwise she reported medicine working very well.  She has upcoming eye surgery and she is anxious about it.  She denies any feeling of hopelessness or worthlessness.  She denies any paranoia or any hallucination.  She is taking Remeron and Wellbutrin and reported no side effects.  She has no tremors or shakes.  Her energy level is good.  She lives by herself.  Patient denies drinking alcohol or using any illegal substances.  Patient seen recently her primary care physician.  I review her blood work area and her cholesterol is slightly increased.  Her potassium is 3.4, glucose 101.  Her BUN 6 and creatinine 1.07.  Suicidal Ideation: No Plan Formed: No Patient has means to carry out plan: No  Homicidal Ideation: No Plan Formed: No Patient has means to carry out plan: No  Review of Systems: Psychiatric: Agitation: No Hallucination: No Depressed Mood: No Insomnia: No Hypersomnia: No Altered Concentration: No Feels Worthless: No Grandiose Ideas: No Belief In Special Powers: No New/Increased Substance Abuse: No Compulsions: No  Neurologic: Headache: No Seizure: No Paresthesias: No  Medical History;  She has history of hypertension, migraine headache, asthma, GERD, chronic pain.  She is seeing a physician at [facility]  internal medicine.    Outpatient Encounter Prescriptions as of 09/24/2016  Medication Sig Dispense Refill  . albuterol (PROAIR HFA) 108 (90 Base) MCG/ACT inhaler Inhale 2 puffs into the lungs as directed. 1 Inhaler 3  . atorvastatin (LIPITOR) 40  MG tablet Take 1 tablet (40 mg total) by mouth daily. 90 tablet 3  . budesonide-formoterol (SYMBICORT) 80-4.5 MCG/ACT inhaler Inhale 2 puffs into the lungs 2 (two) times daily. 1 Inhaler 3  . buPROPion (WELLBUTRIN XL) 300 MG 24 hr tablet TAKE 1 TABLET(300 MG) BY MOUTH DAILY 30 tablet 0  . cetirizine (ZYRTEC) 10 MG tablet Take 1 tablet (10 mg total) by mouth daily. 30 tablet 11  . Cholecalciferol (VITAMIN D3) 50000 UNITS CAPS Take 1 capsule by mouth once a week. 12 capsule 0  . clobetasol ointment (TEMOVATE) 0.05 % APPLY SMALL AMOUNT WITH FINGERTIP TO VULVA EVERY NIGHT AT BEDTIME. 30 g 12  . cloNIDine (CATAPRES) 0.3 MG tablet TAKE 1 AND 1/2 TABLETS BY MOUTH AT BEDTIME 135 tablet 3  . clotrimazole (LOTRIMIN) 1 % cream Apply 1 application topically 2 (two) times daily. For up to 4 weeks 60 g 1  . fluticasone (FLONASE) 50 MCG/ACT nasal spray Place 2 sprays into both nostrils daily. 16 g 6  . gabapentin (NEURONTIN) 100 MG capsule TAKE 1 CAPSULE(100 MG) BY MOUTH THREE TIMES DAILY 270 capsule 4  . hydrochlorothiazide (HYDRODIURIL) 12.5 MG tablet TAKE 1 TABLET BY MOUTH  DAILY 30 tablet 5  . hydrOXYzine (VISTARIL) 25 MG capsule TAKE 1 CAPSULE(25 MG) BY MOUTH DAILY AS NEEDED FOR ANXIETY 30 capsule 0  . linaclotide (LINZESS) 145 MCG CAPS capsule Take 1 capsule (145 mcg total) by mouth daily. 90 capsule 0  . mirtazapine (REMERON) 30 MG tablet Take 1 tablet (30 mg total) by  mouth at bedtime. 90 tablet 0  . montelukast (SINGULAIR) 10 MG tablet Take 1 tablet (10 mg total) by mouth at bedtime. 90 tablet 3  . nicotine (NICODERM CQ - DOSED IN MG/24 HR) 7 mg/24hr patch Place 1 patch (7 mg total) onto the skin daily. 28 patch 0  . potassium chloride SA (K-DUR,KLOR-CON) 20 MEQ tablet TAKE 1 TABLET BY MOUTH DAILY 30 tablet 0  . ranitidine (ZANTAC) 150 MG tablet Take 1 tablet (150 mg total) by mouth 2 (two) times daily. 180 tablet 0  . Spacer/Aero-Holding Chambers (AEROCHAMBER PLUS FLO-VU LARGE) MISC 1 each by Other  route once. (Patient not taking: Reported on 08/12/2016) 1 each 0  . topiramate (TOPAMAX) 25 MG tablet Take 1 tablet (25 mg total) by mouth 2 (two) times daily. 60 tablet 5  . valACYclovir (VALTREX) 500 MG tablet Take 1 tablet (500 mg total) by mouth 2 (two) times daily. x3 days. 30 tablet 0   No facility-administered encounter medications on file as of 09/24/2016.     No results found for this or any previous visit (from the past 72 hour(s)).  Past Psychiatric History/Hospitalization(s): She has history of psychiatric inpatient treatment at [facility].  She was admitted to get detox from her Xanax.  Patient also seeing psychiatrist when she was in [employer] in [place] in [place] and than [facility].  In the past she had tried Paxil Prozac Zoloft.  Patient denies any history of suicidal attempt or any psychosis. Anxiety: Yes Bipolar Disorder: No Depression: Yes Mania: No Psychosis: No Schizophrenia: No Personality Disorder: No Hospitalization for psychiatric illness: Yes History of Electroconvulsive Shock Therapy: No Prior Suicide Attempts: No  Review of Systems  Constitutional: Negative.   Respiratory: Negative.   Skin: Negative.   Psychiatric/Behavioral: The patient is nervous/anxious.    Constitutional:  BP (!) 148/92 (BP Location: Left Arm, Patient Position: Sitting, Cuff Size: Normal) Comment: pt states she takes b/p med at night.  inform Dr. Adele Schilder  Pulse 72   Resp 12   Ht 5' (1.524 m)   Wt 154 lb 12.8 oz (70.2 kg)   LMP  (Approximate) Comment: 1982  BMI 30.23 kg/m   Recent Results (from the past 2160 hour(s))  COMPLETE METABOLIC PANEL WITH GFR     Status: Abnormal   Collection Time: 09/04/16  4:19 PM  Result Value Ref Range   Sodium 143 135 - 146 mmol/L   Potassium 3.4 (L) 3.5 - 5.3 mmol/L   Chloride 108 98 - 110 mmol/L   CO2 23 20 - 31 mmol/L   Glucose, Bld 101 (H) 65 - 99 mg/dL   BUN 6 (L) 7 - 25 mg/dL   Creat 1.07 (H) 0.50 -  0.99 mg/dL    Comment:   For patients > or = 66 years of age: The upper reference limit for Creatinine is approximately 13% higher for people identified as African-American.      Total Bilirubin 0.8 0.2 - 1.2 mg/dL   Alkaline Phosphatase 96 33 - 130 U/L   AST 13 10 - 35 U/L   ALT 11 6 - 29 U/L   Total Protein 7.0 6.1 - 8.1 g/dL   Albumin 4.2 3.6 - 5.1 g/dL   Calcium 9.6 8.6 - 10.4 mg/dL   GFR, Est African American 63 >=60 mL/min   GFR, Est Non African American 54 (L) >=60 mL/min  TSH     Status: None   Collection Time: 09/04/16  4:19 PM  Result Value Ref Range   TSH 0.56 mIU/L    Comment:   Reference Range   > or = 20 Years  0.40-4.50   Pregnancy Range First trimester  0.26-2.66 Second trimester 0.55-2.73 Third trimester  0.43-2.91     Lipid panel     Status: Abnormal   Collection Time: 09/04/16  4:19 PM  Result Value Ref Range   Cholesterol 219 (H) <200 mg/dL    Comment: ** Please note change in reference range(s). **      Triglycerides 132 <150 mg/dL    Comment: ** Please note change in reference range(s). **      HDL 40 (L) >50 mg/dL    Comment: ** Please note change in reference range(s). **      Total CHOL/HDL Ratio 5.5 (H) <5.0 Ratio   VLDL 26 <30 mg/dL   LDL Cholesterol 153 (H) mg/dL    Comment: ** Please note change in reference range(s). **      Musculoskeletal: Strength & Muscle Tone: within normal limits Gait & Station: normal  Mental Status Examination;  Patient is casually dressed and groomed.  She maintained good eye contact.  She is cooperative and pleasant.  She described her mood euthymic and her affect is appropriate.  Her speech is clear and coherent.  Her attention and concentration is okay.  She denies any auditory or visual hallucination.  There were no delusions, paranoia or any obsessive thoughts.  Her thought processes logical and goal-directed.  There were no tremors shakes or any EPS.  Her fund of knowledge is adequate.  Her  psychomotor activity is normal.  She is alert and oriented 3.  Her insight judgment and impulse control is okay.   Established Problem, Stable/Improving (1), Review or order clinical lab tests (1), Review of Last Therapy Session (1) and Review of Medication Regimen & Side Effects (2)  Assessment: Axis I: Major depressive disorder, recurrent mild.  Anxiety disorder NOS  Axis II: Deferred  Axis III: See medical history  Plan:  Patient is a stable on her current medication.  Reinforced to restart Vistaril which she ran out.  I will order 25 mg as needed for insomnia and anxiety .  Continue Remeron 30 mg daily and Wellbutrin XL 300 mg daily.  Discussed medication side effects and lab work.  She has high cholesterol but she is working on her diet.  Recommended to call us back if she is any question or any concern.  Follow-up in 3 months.  Alecia Doi T., MD 09/24/2016

## 2016-09-25 ENCOUNTER — Ambulatory Visit: Payer: Self-pay | Admitting: Family Medicine

## 2016-09-26 ENCOUNTER — Telehealth: Payer: Self-pay | Admitting: Family Medicine

## 2016-09-26 NOTE — Telephone Encounter (Signed)
Pt forgot she had an appt that day. I rescheduled her for 10/07/16 @2pm  - Mesha Guinyard

## 2016-09-30 ENCOUNTER — Encounter: Payer: Self-pay | Admitting: Internal Medicine

## 2016-09-30 ENCOUNTER — Ambulatory Visit (INDEPENDENT_AMBULATORY_CARE_PROVIDER_SITE_OTHER): Payer: Medicare Other | Admitting: Internal Medicine

## 2016-09-30 VITALS — BP 126/70 | HR 64 | Ht 60.0 in | Wt 156.1 lb

## 2016-09-30 DIAGNOSIS — K5900 Constipation, unspecified: Secondary | ICD-10-CM

## 2016-09-30 DIAGNOSIS — K219 Gastro-esophageal reflux disease without esophagitis: Secondary | ICD-10-CM

## 2016-09-30 MED ORDER — LINACLOTIDE 290 MCG PO CAPS: 290.0000 ug | ORAL_CAPSULE | Freq: Every day | ORAL | 0 refills | Status: DC

## 2016-09-30 NOTE — Progress Notes (Signed)
Subjective:    Patient ID: [name], female    DOB: [birth year], 65 y.o.   MRN: [id number]  HPI Kasi Imrie is a 65 year old female with a history of adenomatous colon polyp, chronic constipation, GERD who is seen in follow-up. She was last seen in the office on 07/07/2014 and for procedure on 08/07/2014. She also has a history of hypertension, hyperlipidemia, COPD.  She had upper endoscopy and colonoscopy performed in October 2015. Upper endoscopy showed an irregular Z line which was biopsied. There was a 5 mm gastric polyp biopsied. Patulous pylorus indicating remote ulcer disease and mild gastropathy in the stomach. The duodenum was normal. The distal esophagus biopsy showed reflux without metaplasia. The gastric polyp was benign and hyperplastic. Antral and gastric body biopsies showed benign mucosa with no H. pylori metaplasia or dysplasia. Colonoscopy revealed a sessile polyp in the sigmoid which was removed and found to be vegetable matter but not adenomatous tissue.  She is currently taking ranitidine 150 mg twice a day for reflux and Linzess 145 g daily for constipation.  Her biggest complaint continues to be infrequent bowel movement and episodes of crampy abdominal pain with diaphoresis. This is associated with urgent bowel movement and multiple initially hard and then loose bowel movements over the course of that day. All symptoms then resolve and she is well until this cycle starts again. She does not have bowel movements on days other than these episodes. Her last bowel movement was Friday after Thanksgiving which was now 11 days ago. She does feel abdominal bloating but not pain prior to bowel movement which resolves after bowel movement. She denies blood in her stool or melena. Her reflux is well controlled with ranitidine 150 mg twice a day. She denies dysphagia or odynophagia.  Prior CT scan which was reviewed after her last visit showed significant constipation  with a large amount of fecal material throughout the colon and some thecal his age of the distal small bowel enteric contents. There is no evidence of obstructing lesion or obstruction. Colonoscopy was performed after the scan as discussed above   Review of Systems As per history of present illness, otherwise negative  Current Medications, Allergies, Past Medical History, Past Surgical History, Family History and Social History were reviewed in [employer] record.     Objective:   Physical Exam BP 126/70   Pulse 64   Ht 5' (1.524 m)   Wt 156 lb 2 oz (70.8 kg)   LMP  (Approximate) Comment: 1982  BMI 30.49 kg/m  Constitutional: Well-developed and well-nourished. No distress. HEENT: Normocephalic and atraumatic. Oropharynx is clear and moist. No oropharyngeal exudate.Poor dentition. Conjunctivae are normal.  No scleral icterus. Neck: Neck supple. Trachea midline. Cardiovascular: Normal rate, regular rhythm and intact distal pulses. No M/R/G Pulmonary/chest: Effort normal and breath sounds normal. No wheezing, rales or rhonchi. Abdominal: Soft, Midabdominal tenderness without rebound or guarding, moderately distended without fluid wave. Bowel sounds active throughout.  Extremities: no clubbing, cyanosis, or edema Neurological: Alert and oriented to person place and time. Skin: Skin is warm and dry.  Psychiatric: Normal mood and affect. Behavior is normal.  CBC    Component Value Date/Time   WBC 7.0 12/08/2013 1212   RBC 4.46 12/08/2013 1212   HGB 12.7 12/08/2013 1212   HCT 38.2 12/08/2013 1212   PLT 371 12/08/2013 1212   MCV 85.7 12/08/2013 1212   MCH 28.5 12/08/2013 1212   MCHC 33.2 12/08/2013 1212   RDW 13.6  12/08/2013 1212   LYMPHSABS 4.3 (H) 07/07/2013 1336   MONOABS 0.5 07/07/2013 1336   EOSABS 0.3 07/07/2013 1336   BASOSABS 0.0 07/07/2013 1336   CMP     Component Value Date/Time   NA 143 09/04/2016 1619   K 3.4 (L) 09/04/2016 1619   CL 108  09/04/2016 1619   CO2 23 09/04/2016 1619   GLUCOSE 101 (H) 09/04/2016 1619   BUN 6 (L) 09/04/2016 1619   CREATININE 1.07 (H) 09/04/2016 1619   CALCIUM 9.6 09/04/2016 1619   PROT 7.0 09/04/2016 1619   ALBUMIN 4.2 09/04/2016 1619   AST 13 09/04/2016 1619   ALT 11 09/04/2016 1619   ALKPHOS 96 09/04/2016 1619   BILITOT 0.8 09/04/2016 1619   GFRNONAA 54 (L) 09/04/2016 1619   GFRAA 63 09/04/2016 1619      Assessment & Plan:  65 year old female with a history of adenomatous colon polyp, chronic constipation, GERD who is seen in follow-up.   1. Chronic constipation/intermittent abdominal pain diaphoresis -- her symptoms seem consistent with severe constipation. When she does have a bowel movement she is having multiple bowel movements on a given day associated with abdominal cramping and diaphoresis. The abdominal cramping and diaphoresis are likely related to colonic spasm on the days of the colon is emptying. Previously it seems that she responded to Linzess but despite Linzess 145 g daily she has not had a bowel movement in 11 days. It is felt that all symptoms will improve and we can avoid these episodes of copious bowel movement with diaphoresis and abdominal pain if she is more regular. Discussed this at length today --Moviprep purged today by sample provided in clinic until she has multiple bowel movements. --Then increase Linzess to 290 g daily --Notify me by call or my chart message if higher dose of Linzess not resulting in at least 2-3 bowel movements weekly. --For follow-up in 2-3 months, sooner if necessary for this issue  2. GERD -- continue ranitidine 150 mg twice a day, currently working well  3. History of polyp with adenomatous features -- 5 year recall which would be October 2020  25 minutes spent with the patient today. Greater than 50% was spent in counseling and coordination of care with the patient

## 2016-09-30 NOTE — Patient Instructions (Addendum)
We have sent the following prescriptions to your mail in pharmacy: Linzess 290 mcg daily (in place of 145 mcg dose)  If you have not heard from your mail in pharmacy within 1 week or if you have not received your medication in the mail, please contact us at [phone] so we may find out why.  After taking Linzess 290 mcg daily, if you are still not having at least 203 bowel movements per week, call our office.  Please follow up with Dr Hilarie Fredrickson on Tuesday, 12/02/16 @ 1:45 pm. _______________________________________________________________________   MOVIPREP INSTRUCTIONS  THIS IS A SPLIT-DOSE PREP YOU WILL NOT BE DRINKING ALL THE PREP AT SAME TIME   FOLLOW THESE INSTRUCTIONS, NOT INSTRUCTIONS ON MOVIPREP BOX  Day 1 In the morning, mix the first half of the prep solution:  Empty 1 Pouch A and 1 Pouch B into the disposable container.  Add lukewarm drinking water to the top line of the container. Mix to dissolve.  Refrigerate (mixed solution should be used within 24 hours).  Drink clear liquids the entire day - YOU SHOULD NOT EAT ANY SOLID FOOD.  Avoid juices with pulp. No orange juice.  Drink at least 64 oz. (8 glasses) of fluid/clear liquids during the day to prevent dehydration and help the prep work best.  Clear liquids include: water, ice, tea/coffee (sugar is ok, but no milk or cream), juice (apple, white grape, white cranberry), clear bouillon, consomme, broth, strained chicken noodle soup, jello, popsicles, powdered fruit flavored drinks, gatorade, lemonade, carbonated beverages, hard candy.  Start drinking the first half of the prep solution at 5pm. The MoviPrep container is divided by 4 marks.  Every 15 minutes drink the solution down to the next mark (approximately 8 oz) until the full liter is completely gone.  After you drink the prep solution, wait another 15 minutes and then drink 16 oz clear liquid of your choice (nothing red or purple). Continue to drink clear liquids as  needed until bedtime.  Before going to bed, mix the second half dose of prep solution:  Empty 1 Pouch A and 1 Pouch B into the disposable container.  Add lukewarm drinking water to the top line of the container. Mix to dissolve.  Refrigerate.  Day 2   STAY ON CLEAR LIQUIDS, NO SOLID FOOD  Start drinking the second half of the prep. Every 15 minutes drink the solution down to the next mark (approximately 8 oz) until the full liter is completely gone.  After you drink the prep solution, wait another 15 minutes and then drink 16 oz clear liquid of your choice   *Once you have a bowel movement, you may discontinue drinking prep. __________________________________________________________________________________ If you are age 32 or older, your body mass index should be between 23-30. Your Body mass index is 30.49 kg/m. If this is out of the aforementioned range listed, please consider follow up with your Primary Care Provider.  If you are age 81 or younger, your body mass index should be between 19-25. Your Body mass index is 30.49 kg/m. If this is out of the aformentioned range listed, please consider follow up with your Primary Care Provider.

## 2016-10-07 ENCOUNTER — Ambulatory Visit (INDEPENDENT_AMBULATORY_CARE_PROVIDER_SITE_OTHER): Payer: Medicare Other | Admitting: Family Medicine

## 2016-10-07 ENCOUNTER — Encounter: Payer: Self-pay | Admitting: Family Medicine

## 2016-10-07 VITALS — BP 144/80 | HR 67 | Temp 98.1°F | Ht 60.0 in | Wt 158.8 lb

## 2016-10-07 DIAGNOSIS — F17200 Nicotine dependence, unspecified, uncomplicated: Secondary | ICD-10-CM

## 2016-10-07 DIAGNOSIS — I10 Essential (primary) hypertension: Secondary | ICD-10-CM

## 2016-10-07 NOTE — Patient Instructions (Signed)
[facility] [phone] to get orthotic inserts for your shoes.

## 2016-10-07 NOTE — Progress Notes (Signed)
    Subjective: CC: BP HPI: [name] is a 65 y.o. female presenting to clinic today for follow up. Concerns today include:  1. Hypertension Blood pressure at home: not checking Blood pressure today: 144/80 Meds: Compliant with HCTZ 12.5mg  daily Side effects: none ROS: Denies headache, dizziness, visual changes, nausea, vomiting, chest pain, abdominal pain or shortness of breath.  2. Tobacco use disorder Patient reports she quit cold [place] on Thanksgiving because where she was visiting, you were not allowed to smoke.  She note she has not smoked since.  Denies cough, SOB.  Social History Reviewed: former smoker. FamHx and MedHx reviewed.  Please see EMR.  ROS: Per HPI  Objective: Office vital signs reviewed. BP (!) 144/80   Pulse 67   Temp 98.1 F (36.7 C) (Oral)   Ht 5' (1.524 m)   Wt 158 lb 12.8 oz (72 kg)   LMP  (Approximate) Comment: 1982  SpO2 97%   BMI 31.01 kg/m   Physical Examination:  General: Awake, alert, well nourished, No acute distress, smells of smoke Cardio: regular rate and rhythm, S1S2 heard, no murmurs appreciated Pulm: clear to auscultation bilaterally, no wheezes, rhonchi or rales, normal WOB on room air  Assessment/ Plan: 65 y.o. female   Essential hypertension, benign GOAL <150/90.  She is at goal for age.  She is doing well on medication. Continue HCTZ 12.5mg  daily.  Follow up in 3 months or sooner if needed.  NICOTINE ADDICTION Stopped smoking 09/22/2016.   Janora Norlander, DO PGY-3, [facility]

## 2016-10-07 NOTE — Assessment & Plan Note (Signed)
Stopped smoking 09/22/2016.

## 2016-10-07 NOTE — Assessment & Plan Note (Signed)
GOAL <150/90.  She is at goal for age.  She is doing well on medication. Continue HCTZ 12.5mg  daily.  Follow up in 3 months or sooner if needed.

## 2016-10-13 ENCOUNTER — Other Ambulatory Visit: Payer: Self-pay | Admitting: Internal Medicine

## 2016-10-16 DIAGNOSIS — H16203 Unspecified keratoconjunctivitis, bilateral: Secondary | ICD-10-CM | POA: Diagnosis not present

## 2016-11-06 ENCOUNTER — Ambulatory Visit: Payer: Self-pay | Admitting: Family Medicine

## 2016-11-12 ENCOUNTER — Other Ambulatory Visit (HOSPITAL_COMMUNITY): Payer: Self-pay | Admitting: Psychiatry

## 2016-11-12 DIAGNOSIS — F330 Major depressive disorder, recurrent, mild: Secondary | ICD-10-CM

## 2016-11-18 ENCOUNTER — Encounter (HOSPITAL_COMMUNITY): Payer: Self-pay | Admitting: Psychology

## 2016-11-18 ENCOUNTER — Other Ambulatory Visit: Payer: Self-pay | Admitting: Internal Medicine

## 2016-11-18 NOTE — Progress Notes (Signed)
[name] is a 66 y.o. female patient who is discharged from counseling as hasn't returned since 03/18/16 intake.. Outpatient Therapist Discharge Summary  [name]    [birth year]   Admission Date: 03/18/16   Discharge Date:  11/18/16 Reason for Discharge:  Not active in counseling Diagnosis:  MDD, recurrent  Comments:  Pt continues w/ Dr. Lauro Franklin 2017 note indicated pt stable.    Jenne Campus, LPC

## 2016-12-02 ENCOUNTER — Ambulatory Visit (INDEPENDENT_AMBULATORY_CARE_PROVIDER_SITE_OTHER): Payer: Medicare Other | Admitting: Internal Medicine

## 2016-12-02 ENCOUNTER — Encounter: Payer: Self-pay | Admitting: Internal Medicine

## 2016-12-02 VITALS — BP 106/74 | HR 72 | Ht 60.0 in | Wt 158.4 lb

## 2016-12-02 DIAGNOSIS — K219 Gastro-esophageal reflux disease without esophagitis: Secondary | ICD-10-CM

## 2016-12-02 DIAGNOSIS — K5909 Other constipation: Secondary | ICD-10-CM | POA: Diagnosis not present

## 2016-12-02 MED ORDER — PANTOPRAZOLE SODIUM 40 MG PO TBEC: 40.0000 mg | DELAYED_RELEASE_TABLET | Freq: Every day | ORAL | 1 refills | Status: DC

## 2016-12-02 NOTE — Patient Instructions (Signed)
Please continue your Linzess 290 mcg once daily.  We have sent the following medications to your pharmacy for you to pick up at your convenience: Pantoprazole 40 mg every morning  Change ranitidine to just 1 tablet every evening dosage (instead of twice daily dosing)  Please follow up in 6-9 months.  If you are age 67 or older, your body mass index should be between 23-30. Your Body mass index is 30.93 kg/m. If this is out of the aforementioned range listed, please consider follow up with your Primary Care Provider.  If you are age 30 or younger, your body mass index should be between 19-25. Your Body mass index is 30.93 kg/m. If this is out of the aformentioned range listed, please consider follow up with your Primary Care Provider.

## 2016-12-02 NOTE — Progress Notes (Signed)
   Subjective:    Patient ID: [name], female    DOB: April 13, 1951, 66 y.o.   MRN: [id number]  HPI [name] is a 66 year old female with a history of colon polyps, chronic constipation and GERD who is here for follow-up. She was last seen in December 2017. At that time her Linzess was increased to 290 g daily. She is here alone today.  She reports this is helped tremendously with her chronic constipation. She is still having some borborygmi before bowel movement but has had much improvement in constipation. She's having a bowel movement daily. She denies diarrhea and fecal urgency. She has not had further episodes of crampy abdominal pain or diaphoresis. No blood in her stool or melena  Reflux is been less well controlled recently aspirant ranitidine 150 mg twice a day. Heartburn is worse at night. No dysphagia.  She has planned multiple dental extractions later this month which she hopes will help with her chewing and nutrition. She will then be fitted for dentures   Review of Systems As per history of present illness, otherwise negative  Current Medications, Allergies, Past Medical History, Past Surgical History, Family History and Social History were reviewed in [employer] record.     Objective:   Physical Exam BP 106/74   Pulse 72   Ht 5' (1.524 m)   Wt 158 lb 6 oz (71.8 kg)   BMI 30.93 kg/m  Constitutional: Well-developed and well-nourished. No distress. HEENT: Normocephalic and atraumatic. Oropharynx is clear and moist. No oropharyngeal exudate. Conjunctivae are normal.  No scleral icterus. Neck: Neck supple. Trachea midline. Cardiovascular: Normal rate, regular rhythm and intact distal pulses.  Pulmonary/chest: Effort normal and breath sounds normal. No wheezing, rales or rhonchi. Abdominal: Soft, nontender, nondistended. Bowel sounds active throughout. Extremities: no clubbing, cyanosis, or edema Psychiatric: Normal mood and  affect. Behavior is normal.      Assessment & Plan:  66 year old female with a history of colon polyps, chronic constipation and GERD who is here for follow-up.  1. Chronic constipation -- much improvement with higher dose Linzess. We'll continue Linzess 290 g daily. No further episodes of abdominal pain and diaphoresis now that she is having bowel movements more regularly  2. GERD -- exacerbation lately without alarm symptom. Changed pantoprazole 40 mg once daily before breakfast and ranitidine 150 mg in the evening. Notify me if not improving.  3. History of adenomatous polyp -- surveillance colonoscopy recommended October 2020  15 minutes spent with the patient today. Greater than 50% was spent in counseling and coordination of care with the patient

## 2016-12-25 ENCOUNTER — Ambulatory Visit (HOSPITAL_COMMUNITY): Payer: Self-pay | Admitting: Psychiatry

## 2016-12-27 ENCOUNTER — Other Ambulatory Visit: Payer: Self-pay | Admitting: Family Medicine

## 2016-12-27 ENCOUNTER — Other Ambulatory Visit (HOSPITAL_COMMUNITY): Payer: Self-pay | Admitting: Psychiatry

## 2016-12-27 ENCOUNTER — Other Ambulatory Visit: Payer: Self-pay | Admitting: Internal Medicine

## 2016-12-27 DIAGNOSIS — B009 Herpesviral infection, unspecified: Secondary | ICD-10-CM

## 2016-12-27 DIAGNOSIS — F330 Major depressive disorder, recurrent, mild: Secondary | ICD-10-CM

## 2017-01-06 ENCOUNTER — Ambulatory Visit (HOSPITAL_COMMUNITY): Payer: Self-pay | Admitting: Psychiatry

## 2017-02-05 ENCOUNTER — Other Ambulatory Visit (HOSPITAL_COMMUNITY): Payer: Self-pay | Admitting: Psychiatry

## 2017-02-05 DIAGNOSIS — F330 Major depressive disorder, recurrent, mild: Secondary | ICD-10-CM

## 2017-02-20 ENCOUNTER — Other Ambulatory Visit (HOSPITAL_COMMUNITY): Payer: Self-pay | Admitting: Psychiatry

## 2017-02-20 DIAGNOSIS — F330 Major depressive disorder, recurrent, mild: Secondary | ICD-10-CM

## 2017-03-09 DIAGNOSIS — L820 Inflamed seborrheic keratosis: Secondary | ICD-10-CM | POA: Diagnosis not present

## 2017-03-09 DIAGNOSIS — D485 Neoplasm of uncertain behavior of skin: Secondary | ICD-10-CM | POA: Diagnosis not present

## 2017-03-09 DIAGNOSIS — L720 Epidermal cyst: Secondary | ICD-10-CM | POA: Diagnosis not present

## 2017-03-31 ENCOUNTER — Ambulatory Visit: Payer: Self-pay | Admitting: Family Medicine

## 2017-04-01 ENCOUNTER — Ambulatory Visit (INDEPENDENT_AMBULATORY_CARE_PROVIDER_SITE_OTHER): Payer: Medicare Other | Admitting: Psychiatry

## 2017-04-01 ENCOUNTER — Encounter (HOSPITAL_COMMUNITY): Payer: Self-pay | Admitting: Psychiatry

## 2017-04-01 DIAGNOSIS — Z818 Family history of other mental and behavioral disorders: Secondary | ICD-10-CM | POA: Diagnosis not present

## 2017-04-01 DIAGNOSIS — Z87891 Personal history of nicotine dependence: Secondary | ICD-10-CM | POA: Diagnosis not present

## 2017-04-01 DIAGNOSIS — F330 Major depressive disorder, recurrent, mild: Secondary | ICD-10-CM

## 2017-04-01 MED ORDER — BUPROPION HCL ER (XL) 300 MG PO TB24: 300.0000 mg | ORAL_TABLET | Freq: Every day | ORAL | 0 refills | Status: DC

## 2017-04-01 MED ORDER — MIRTAZAPINE 30 MG PO TABS: 30.0000 mg | ORAL_TABLET | Freq: Every day | ORAL | 0 refills | Status: DC

## 2017-04-01 MED ORDER — HYDROXYZINE PAMOATE 25 MG PO CAPS: ORAL_CAPSULE | ORAL | 0 refills | Status: DC

## 2017-04-01 NOTE — Progress Notes (Signed)
BH MD/PA/NP OP Progress Note  04/01/2017 1:02 PM HOLIDAY MCMENAMIN  MRN:  [id number]  Chief Complaint:  Subjective:  I am good on medication.  HPI: Patient came for her follow-up appointment.  She was last seen more than 7 months ago.  She apologized missing appointment but she was doing good and using leftover medication.  She denies any irritability, anger, mania, psychosis.  Her sleep is good.  She lives by herself but she continues to be in touch with the daughter every day.  She excited as next week going to attend her 74 year old granddaughter graduation in [place].  Patient denies drinking alcohol or using any illegal substances.  She denies any tremors shakes or any EPS.  Her appetite is okay.  Patient has high blood pressure and she is taking multiple medication for blood pressure.  She denies any chest pain, shortness of breath or any sweating.  Her energy level is good.  She scheduled to see her primary care physician next week to addressed her high blood pressure.  Patient denies any suicidal thoughts or homicidal thought.  She like to continue Remeron, Wellbutrin and Vistaril  Visit Diagnosis:    ICD-10-CM   1. Major depressive disorder, recurrent episode, mild (HCC) F33.0 mirtazapine (REMERON) 30 MG tablet    hydrOXYzine (VISTARIL) 25 MG capsule    buPROPion (WELLBUTRIN XL) 300 MG 24 hr tablet    Past Psychiatric History: Reviewed. She has history of psychiatric inpatient treatment at [facility].  She was admitted to get detox from her Xanax.  Patient also seeing psychiatrist when she was in [employer] in [place] in [place] and than [facility].  In the past she had tried Paxil Prozac Zoloft.  Patient denies any history of suicidal attempt or any psychosis.  Past Medical History:  Past Medical History:  Diagnosis Date  . Adenomatous colon polyp   . Anxiety   . Asthma   . Back pain with radiation    radiopathy   . Carpal tunnel syndrome  10/27/2010  . COPD (chronic obstructive pulmonary disease) ([place])   . Corneal abrasion, left 04/28/2013  . Depression   . GERD (gastroesophageal reflux disease)   . Hx of migraines   . Hyperlipidemia   . Hypertension   . Nicotine addiction   . PANIC ATTACK 11/05/2007  . PELVIC  PAIN 08/13/2010  . Urinary incontinence     Past Surgical History:  Procedure Laterality Date  . bleeding ulcer repair  1992   Open Ex Lap with perforation of duodenal ulcer with pertitonitis  . CHOLECYSTECTOMY  1999  . double mastectomy  June 04, 2010  . HIATAL HERNIA REPAIR  1967  . left ankle surgery  2008  . removal of mild ducts from left breast  1982  . TOTAL ABDOMINAL HYSTERECTOMY W/ BILATERAL SALPINGOOPHORECTOMY  1989  . TUBAL LIGATION  1975, 1978   x 2    Family Psychiatric History: Reviewed.  Family History:  Family History  Problem Relation Age of Onset  . Heart disease Mother   . Diabetes Mother   . Lupus Mother   . Colon polyps Mother   . Tuberculosis Father   . Depression Sister   . Diabetes Brother   . Colon polyps Maternal Aunt   . Heart disease Maternal Grandmother   . Heart attack Maternal Aunt   . Stomach cancer Sister   . Endometriosis Daughter   . Diabetes Daughter   . Colon cancer Neg Hx   .  Esophageal cancer Neg Hx   . Pancreatic cancer Neg Hx   . Rectal cancer Neg Hx     Social History:  Social History   Social History  . Marital status: Legally Separated    Spouse name: N/A  . Number of children: 3  . Years of education: N/A   Occupational History  . retired    Social History Main Topics  . Smoking status: Former Smoker    Packs/day: 0.25    Years: 42.00    Types: Cigarettes    Quit date: 08/26/2015  . Smokeless tobacco: Never Used  . Alcohol use No  . Drug use: No  . Sexual activity: No   Other Topics Concern  . Not on file   Social History Narrative  . No narrative on file    Allergies:  Allergies  Allergen Reactions  . Aspirin Other (See  Comments)    Dr told pt not to take asa d/t bleeding ulcer in 1985.  [name] Penicillins Nausea Only    Tablets only.  Denies any airway involvement.    . Latex Rash    Relieved with over the counter medications.    Metabolic Disorder Labs: Lab Results  Component Value Date   HGBA1C 6.0 04/28/2013   MPG 126 (H) 01/20/2012   No results found for: PROLACTIN Lab Results  Component Value Date   CHOL 219 (H) 09/04/2016   TRIG 132 09/04/2016   HDL 40 (L) 09/04/2016   CHOLHDL 5.5 (H) 09/04/2016   VLDL 26 09/04/2016   LDLCALC 153 (H) 09/04/2016   LDLCALC 107 (H) 05/17/2015     Current Medications: Current Outpatient Prescriptions  Medication Sig Dispense Refill  . albuterol (PROAIR HFA) 108 (90 Base) MCG/ACT inhaler Inhale 2 puffs into the lungs as directed. 1 Inhaler 3  . atorvastatin (LIPITOR) 40 MG tablet Take 1 tablet (40 mg total) by mouth daily. 90 tablet 3  . budesonide-formoterol (SYMBICORT) 80-4.5 MCG/ACT inhaler Inhale 2 puffs into the lungs 2 (two) times daily. 1 Inhaler 3  . buPROPion (WELLBUTRIN XL) 300 MG 24 hr tablet TAKE 1 TABLET BY MOUTH  DAILY 90 tablet 0  . cetirizine (ZYRTEC) 10 MG tablet Take 1 tablet (10 mg total) by mouth daily. 30 tablet 11  . cloNIDine (CATAPRES) 0.3 MG tablet TAKE 1 AND 1/2 TABLETS BY MOUTH AT BEDTIME 135 tablet 3  . fluticasone (FLONASE) 50 MCG/ACT nasal spray Place 2 sprays into both nostrils daily. 16 g 6  . gabapentin (NEURONTIN) 100 MG capsule TAKE 1 CAPSULE(100 MG) BY MOUTH THREE TIMES DAILY (Patient taking differently: as needed. TAKE 1 CAPSULE(100 MG) BY MOUTH THREE TIMES DAILY) 270 capsule 4  . hydrochlorothiazide (HYDRODIURIL) 12.5 MG tablet TAKE 1 TABLET BY MOUTH  DAILY 30 tablet 5  . hydrOXYzine (VISTARIL) 25 MG capsule TAKE 1 CAPSULE BY MOUTH  DAILY AS NEEDED FOR ANXIETY 90 capsule 0  . LINZESS 290 MCG CAPS capsule TAKE 1 CAPSULE BY MOUTH  DAILY BEFORE BREAKFAST 90 capsule 1  . mirtazapine (REMERON) 30 MG tablet TAKE 1 TABLET BY MOUTH  AT  BEDTIME 90 tablet 0  . pantoprazole (PROTONIX) 40 MG tablet Take 1 tablet (40 mg total) by mouth daily. 90 tablet 1  . potassium chloride SA (K-DUR,KLOR-CON) 20 MEQ tablet TAKE 1 TABLET BY MOUTH DAILY 30 tablet 0  . ranitidine (ZANTAC) 150 MG tablet TAKE 1 TABLET BY MOUTH TWO  TIMES DAILY 180 tablet 1  . topiramate (TOPAMAX) 25 MG tablet Take 1 tablet (  25 mg total) by mouth 2 (two) times daily. 60 tablet 5  . valACYclovir (VALTREX) 500 MG tablet Take 1 tablet (500 mg total) by mouth 2 (two) times daily. x3 days as needed for flares 30 tablet 0   No current facility-administered medications for this visit.     Neurologic: Headache: No Seizure: No Paresthesias: No  Musculoskeletal: Strength & Muscle Tone: within normal limits Gait & Station: normal Patient leans: N/A  Psychiatric Specialty Exam: Review of Systems  Constitutional: Negative.   HENT: Negative.   Respiratory: Negative for shortness of breath.   Cardiovascular: Negative for chest pain and palpitations.  Genitourinary: Negative.   Musculoskeletal: Negative.   Skin: Negative.   Neurological: Negative.     Blood pressure (!) 160/98, pulse 67, height 5' (1.524 m), weight 154 lb (69.9 kg), SpO2 98 %.Body mass index is 30.08 kg/m.  General Appearance: Casual  Eye Contact:  Good  Speech:  Clear and Coherent  Volume:  Normal  Mood:  Euthymic  Affect:  Congruent  Thought Process:  Goal Directed  Orientation:  Full (Time, Place, and Person)  Thought Content: WDL and Logical   Suicidal Thoughts:  No  Homicidal Thoughts:  No  Memory:  Immediate;   Good Recent;   Good Remote;   Good  Judgement:  Good  Insight:  Good  Psychomotor Activity:  Normal   Concentration:  Concentration: Good and Attention Span: Good  Recall:  Good  Fund of Knowledge: Good  Language: Good  Akathisia:  No  Handed:  Right  AIMS (if indicated):  0  Assets:  Communication Skills Desire for Improvement Housing Resilience Social Support   ADL's:  Intact  Cognition: WNL  Sleep:  good   Assessment: Major depressive disorder, recurrent.  Plan: I will continue her current psychiatric medication.  She has no side effects.  Continue Remeron 30 mg at bedtime, Wellbutrin XL 300 mg daily and Vistaril 25 mg at bedtime.  She is no longer taking gabapentin which was prescribed by her primary care physician for chronic pain.  She endorse her pain is much better.  She has high blood pressure and I encourage her to see her primary care physician sooner than next week.  Discussed medication side effects and benefits.  Recommended to call us back if she has any question, concern if she feels worsening of the symptom.  Follow-up in 3 months.  Nalda Shackleford T., MD 04/01/2017, 1:02 PM

## 2017-04-01 NOTE — Progress Notes (Signed)
Patient agreed to call her primary care MD, Dr. Lajuana Ripple today at [facility] to inform of headaches and current increased blood pressure of 160/98.  Offered to call with patient but she stated she has an appointment in the coming week and will call them today.  Dr. Adele Schilder informed as patient had missed last appointment with PCP and her report she would call to follow up today and keep appointment scheduled.

## 2017-04-06 ENCOUNTER — Other Ambulatory Visit: Payer: Self-pay | Admitting: Family Medicine

## 2017-04-07 NOTE — Progress Notes (Deleted)
    Subjective: CC:*** HPI: [name] is a 66 y.o. female presenting to clinic today for:  1. Hypertension Last OV for HTN 09/2016.  Her BP at that time was well controlled on HCTZ 12.5mg .  She reports that since last visit *** Blood pressure at home: *** Blood pressure today: *** Meds: Compliant with *** Side effects: *** ROS: Denies headache, dizziness, visual changes, nausea, vomiting, chest pain, abdominal pain or shortness of breath.  Social Hx reviewed: *** smoker. MedHx, medications and allergies reviewed.  Please see EMR. Health Maintenance:  Tdap Vaccine: yes; Pneumonia Vaccine: yes  ROS: Per HPI  Objective: Office vital signs reviewed. There were no vitals taken for this visit.  Physical Examination:  General: Awake, alert, *** nourished, No acute distress HEENT: Normal    Neck: No masses palpated. No lymphadenopathy    Ears: Tympanic membranes intact, normal light reflex, no erythema, no bulging    Eyes: PERRLA, EOMI, sclera ***    Nose: nasal turbinates moist, *** nasal discharge    Throat: moist mucus membranes, no erythema, *** tonsillar exudate.  Airway is patent Cardio: regular rate and rhythm, S1S2 heard, no murmurs appreciated Pulm: clear to auscultation bilaterally, no wheezes, rhonchi or rales; normal work of breathing on room air GI: soft, non-tender, non-distended, bowel sounds present x4, no hepatomegaly, no splenomegaly, no masses GU: external vaginal tissue ***, cervix ***, *** punctate lesions on cervix appreciated, *** discharge from cervical os, *** bleeding, *** cervical motion tenderness, *** abdominal/ adnexal masses Extremities: warm, well perfused, No edema, cyanosis or clubbing; +*** pulses bilaterally MSK: *** gait and *** station Skin: dry; intact; no rashes or lesions Neuro: *** Strength and light touch sensation grossly intact, *** DTRs ***/4  Assessment/ Plan: 66 y.o. female   No problem-specific Assessment & Plan notes  found for this encounter.   Janora Norlander, DO PGY-3, [facility]

## 2017-04-09 ENCOUNTER — Ambulatory Visit: Payer: Self-pay | Admitting: Family Medicine

## 2017-05-14 ENCOUNTER — Ambulatory Visit (INDEPENDENT_AMBULATORY_CARE_PROVIDER_SITE_OTHER): Payer: Medicare Other | Admitting: Family Medicine

## 2017-05-14 ENCOUNTER — Encounter: Payer: Self-pay | Admitting: Family Medicine

## 2017-05-14 ENCOUNTER — Ambulatory Visit: Payer: Medicare Other | Admitting: Student in an Organized Health Care Education/Training Program

## 2017-05-14 VITALS — BP 176/92 | HR 87 | Temp 98.5°F | Wt 150.0 lb

## 2017-05-14 DIAGNOSIS — B3731 Acute candidiasis of vulva and vagina: Secondary | ICD-10-CM

## 2017-05-14 DIAGNOSIS — B379 Candidiasis, unspecified: Secondary | ICD-10-CM | POA: Diagnosis not present

## 2017-05-14 DIAGNOSIS — B373 Candidiasis of vulva and vagina: Secondary | ICD-10-CM

## 2017-05-14 MED ORDER — FLUCONAZOLE 150 MG PO TABS: 150.0000 mg | ORAL_TABLET | Freq: Once | ORAL | 0 refills | Status: DC

## 2017-05-14 NOTE — Patient Instructions (Addendum)
Vaginal Yeast infection, Adult Vaginal yeast infection is a condition that causes soreness, swelling, and redness (inflammation) of the vagina. It also causes vaginal discharge. This is a common condition. Some women get this infection frequently. What are the causes? This condition is caused by a change in the normal balance of the yeast (candida) and bacteria that live in the vagina. This change causes an overgrowth of yeast, which causes the inflammation. What increases the risk? This condition is more likely to develop in:  Women who take antibiotic medicines.  Women who have diabetes.  Women who take birth control pills.  Women who are pregnant.  Women who douche often.  Women who have a weak defense (immune) system.  Women who have been taking steroid medicines for a long time.  Women who frequently wear tight clothing.  What are the signs or symptoms? Symptoms of this condition include:  White, thick vaginal discharge.  Swelling, itching, redness, and irritation of the vagina. The lips of the vagina (vulva) may be affected as well.  Pain or a burning feeling while urinating.  Pain during sex.  How is this diagnosed? This condition is diagnosed with a medical history and physical exam. This will include a pelvic exam. Your health care provider will examine a sample of your vaginal discharge under a microscope. Your health care provider may send this sample for testing to confirm the diagnosis. How is this treated? This condition is treated with medicine. Medicines may be over-the-counter or prescription. You may be told to use one or more of the following:  Medicine that is taken orally.  Medicine that is applied as a cream.  Medicine that is inserted directly into the vagina (suppository).  Follow these instructions at home:  Take or apply over-the-counter and prescription medicines only as told by your health care provider.  Do not have sex until your health  care provider has approved. Tell your sex partner that you have a yeast infection. That person should go to his or her health care provider if he or she develops symptoms.  Do not wear tight clothes, such as pantyhose or tight pants.  Avoid using tampons until your health care provider approves.  Eat more yogurt. This may help to keep your yeast infection from returning.  Try taking a sitz bath to help with discomfort. This is a warm water bath that is taken while you are sitting down. The water should only come up to your hips and should cover your buttocks. Do this 3-4 times per day or as told by your health care provider.  Do not douche.  Wear breathable, cotton underwear.  If you have diabetes, keep your blood sugar levels under control. Contact a health care provider if:  You have a fever.  Your symptoms go away and then return.  Your symptoms do not get better with treatment.  Your symptoms get worse.  You have new symptoms.  You develop blisters in or around your vagina.  You have blood coming from your vagina and it is not your menstrual period.  You develop pain in your abdomen. This information is not intended to replace advice given to you by your health care provider. Make sure you discuss any questions you have with your health care provider. Document Released: 07/23/2005 Document Revised: 03/26/2016 Document Reviewed: 04/16/2015 Elsevier Interactive Patient Education  [employer].   It was a pleasure meeting you. I have prescribed you one dose of fluconazole to be taken to treat  your yeast infection. As we discussed, please do not use harsh soaps in the vaginal area as this may cause irritation. Please come back sooner if symptoms persist or worsen.   Thank you,  Caroline More, DO

## 2017-05-14 NOTE — Progress Notes (Signed)
   Subjective:    Patient ID: [name], female    DOB: 1951-10-09, 66 y.o.   MRN: [id number]   CC: Yeast infection  HPI:  Yeast infection [name] is a 66 y.o. Female presenting with symptoms of a yeast infection. Patient has history of recurrent yeast infections after antibiotic use. Patient took  doxycycline x 2 weeks following a mole removal. Due to history of recurrent yeast infections, prior physicians have prescribed her diflucan with antibiotic regimen, but patient was not prescribed diflucan with most recent antibiotic use. Patient has had 2 previous yeast infections and this feels similar. Patient notes a burning in vaginal area and notes a slight odor similar to eggs, but states no discharge. Patient denies sexual activity and no tampon or pessary insertion. Patient states she cleans vaginal area daily with soap and water. Denies dysuria or increased frequency in urination. Patient denies diarrhea, fever, chills, fatigue, weakness, nausea, or vomiting. Patient reports menopause more than 20 years ago but has not had problems with vaginal dryness since.   Review of Systems: Per HPI  Objective:  BP (!) 176/92   Pulse 87   Temp 98.5 F (36.9 C) (Oral)   Wt 150 lb (68 kg)   SpO2 99%   BMI 29.29 kg/m  Vitals and nursing note reviewed  General: well nourished, in no acute distress Neck: supple, non-tender Cardiac: RRR, clear S1 and S2, no murmurs, rubs, or gallops Respiratory: clear to auscultation bilaterally, no increased work of breathing Abdomen: soft, nontender, nondistended, no masses or organomegaly. Bowel sounds present in all 4 extremities  GU: patient refused genitourinary exam Extremities: no edema or cyanosis. Warm, well perfused. Skin: warm and dry, no rashes noted Neuro: alert and oriented   Assessment & Plan:    Vulvovaginal candidiasis Patient has history of recurrent vaginal yeast infections following antibiotic use. Patient states she has  burning in vaginal area without drainage. slight odor noted by patient. Due to recent history of 2 week doxycycline dose and likely irritation to vaginal area with soaps, patient likely has yeast infection. Patient refused vaginal speculum exam so unable to perform testing.  -fluconazole (Diflucan) 150 mg once  -f/u as needed -have instructed patient to not use harsh soaps or chemical in vaginal area   Return if symptoms worsen or fail to improve.  Patient has understood plan and has agreed to [place], DO, PGY-1

## 2017-05-14 NOTE — Assessment & Plan Note (Signed)
Patient has history of recurrent vaginal yeast infections following antibiotic use. Patient states she has burning in vaginal area without drainage. slight odor noted by patient. Due to recent history of 2 week doxycycline dose and likely irritation to vaginal area with soaps, patient likely has yeast infection. Patient refused vaginal speculum exam so unable to perform testing.  -fluconazole (Diflucan) 150 mg once  -f/u as needed -have instructed patient to not use harsh soaps or chemical in vaginal area

## 2017-05-14 NOTE — Progress Notes (Signed)
   Subjective:    Patient ID: LYNDSI ALTIC, female    DOB: 07-17-1951, 66 y.o.   MRN: [id number]   CC:  HPI: abx 2 weeks ago. Normally given diflucan this time not. Washes with soap and water daily.  No urinary symptoms No discharge, there is burning in vaginal region  No fever or chills,  Has had 2 yeast infections in past  Doxycycline 2 weeks. Mole in the left thigh.  Menopause over 20 years ago. Denies dryness   Smoking status reviewed  Review of Systems   Objective:  BP (!) 176/92   Pulse 87   Temp 98.5 F (36.9 C) (Oral)   Wt 150 lb (68 kg)   SpO2 99%   BMI 29.29 kg/m  Vitals and nursing note reviewed  General: well nourished, in no acute distress HEENT: normocephalic, TM's visualized bilaterally, no scleral icterus or conjunctival pallor, no nasal discharge, moist mucous membranes, good dentition without erythema or discharge noted in posterior oropharynx Neck: supple, non-tender, without lymphadenopathy Cardiac: RRR, clear S1 and S2, no murmurs, rubs, or gallops Respiratory: clear to auscultation bilaterally, no increased work of breathing Abdomen: soft, nontender, nondistended, no masses or organomegaly. Bowel sounds present Extremities: no edema or cyanosis. Warm, well perfused. 2+ radial and PT pulses bilaterally Skin: warm and dry, no rashes noted Neuro: alert and oriented, no focal deficits   Assessment & Plan:    No problem-specific Assessment & Plan notes found for this encounter.    No Follow-up on file.   Caroline More, DO, PGY-1

## 2017-05-18 ENCOUNTER — Encounter: Payer: Self-pay | Admitting: *Deleted

## 2017-05-20 ENCOUNTER — Other Ambulatory Visit (HOSPITAL_COMMUNITY): Payer: Self-pay | Admitting: Psychiatry

## 2017-05-20 DIAGNOSIS — F330 Major depressive disorder, recurrent, mild: Secondary | ICD-10-CM

## 2017-06-02 ENCOUNTER — Telehealth: Payer: Self-pay | Admitting: Internal Medicine

## 2017-06-02 NOTE — Telephone Encounter (Signed)
Pt reports she is having more issues with reflux. She is taking protonix 40mg  in the am and 2 150mg  of zantac in the evening. Discussed with pt that she may need a different PPI. Pt states she can't wait until October to be seen. Offered pt an appt to see midlevel and she declined. Discussed with her that October is his first available appt, that she could see her PCP if she had to be seen sooner. Pt hung up the phone.

## 2017-06-02 NOTE — Telephone Encounter (Signed)
Left message for pt to call back  °

## 2017-06-26 DIAGNOSIS — H16202 Unspecified keratoconjunctivitis, left eye: Secondary | ICD-10-CM | POA: Diagnosis not present

## 2017-07-02 ENCOUNTER — Ambulatory Visit (HOSPITAL_COMMUNITY): Payer: Medicare Other | Admitting: Psychiatry

## 2017-07-04 ENCOUNTER — Other Ambulatory Visit: Payer: Self-pay | Admitting: Family Medicine

## 2017-07-06 ENCOUNTER — Telehealth: Payer: Self-pay | Admitting: Family Medicine

## 2017-07-06 NOTE — Telephone Encounter (Signed)
Pt is calling for a refill on her clonidine and hydrochlorothiazide called in to the [facility] [address]. jw

## 2017-07-07 ENCOUNTER — Other Ambulatory Visit: Payer: Self-pay | Admitting: Family Medicine

## 2017-07-07 DIAGNOSIS — I10 Essential (primary) hypertension: Secondary | ICD-10-CM

## 2017-07-07 MED ORDER — HYDROCHLOROTHIAZIDE 12.5 MG PO TABS: 12.5000 mg | ORAL_TABLET | Freq: Every day | ORAL | 5 refills | Status: DC

## 2017-07-07 NOTE — Telephone Encounter (Signed)
Pt only 6 clonidine pills left. She needs her refill before the storm gets here

## 2017-07-07 NOTE — Telephone Encounter (Signed)
Sent @ 4:36 today.  Jimeka Balan, Salome Spotted, CMA

## 2017-07-07 NOTE — Telephone Encounter (Signed)
I was able to refill [name]'s clonidine because the prescription request was sent to me, but I am having trouble e-prescribing from the patient's chart, so I was unable to e-prescribe her HCTZ.  I can probably approve a request for her HCTZ if it is sent to me, and she may be able to get the medication that way.  I'm sorry for this issue!

## 2017-07-10 ENCOUNTER — Other Ambulatory Visit: Payer: Self-pay | Admitting: Internal Medicine

## 2017-07-13 DIAGNOSIS — Z23 Encounter for immunization: Secondary | ICD-10-CM | POA: Diagnosis not present

## 2017-07-29 ENCOUNTER — Ambulatory Visit (INDEPENDENT_AMBULATORY_CARE_PROVIDER_SITE_OTHER): Payer: Medicare Other | Admitting: Psychiatry

## 2017-07-29 ENCOUNTER — Encounter (HOSPITAL_COMMUNITY): Payer: Self-pay | Admitting: Psychiatry

## 2017-07-29 VITALS — BP 130/76 | HR 65 | Ht 60.0 in | Wt 154.4 lb

## 2017-07-29 DIAGNOSIS — M549 Dorsalgia, unspecified: Secondary | ICD-10-CM

## 2017-07-29 DIAGNOSIS — M2550 Pain in unspecified joint: Secondary | ICD-10-CM | POA: Diagnosis not present

## 2017-07-29 DIAGNOSIS — Z599 Problem related to housing and economic circumstances, unspecified: Secondary | ICD-10-CM | POA: Diagnosis not present

## 2017-07-29 DIAGNOSIS — F419 Anxiety disorder, unspecified: Secondary | ICD-10-CM

## 2017-07-29 DIAGNOSIS — Z818 Family history of other mental and behavioral disorders: Secondary | ICD-10-CM | POA: Diagnosis not present

## 2017-07-29 DIAGNOSIS — R450 Nervousness: Secondary | ICD-10-CM

## 2017-07-29 DIAGNOSIS — F330 Major depressive disorder, recurrent, mild: Secondary | ICD-10-CM | POA: Diagnosis not present

## 2017-07-29 DIAGNOSIS — G4700 Insomnia, unspecified: Secondary | ICD-10-CM | POA: Diagnosis not present

## 2017-07-29 DIAGNOSIS — Z87891 Personal history of nicotine dependence: Secondary | ICD-10-CM

## 2017-07-29 MED ORDER — MIRTAZAPINE 30 MG PO TABS: 30.0000 mg | ORAL_TABLET | Freq: Every day | ORAL | 0 refills | Status: DC

## 2017-07-29 MED ORDER — BUPROPION HCL ER (XL) 300 MG PO TB24: 300.0000 mg | ORAL_TABLET | Freq: Every day | ORAL | 0 refills | Status: DC

## 2017-07-29 MED ORDER — DIVALPROEX SODIUM 250 MG PO DR TAB: 250.0000 mg | DELAYED_RELEASE_TABLET | Freq: Every day | ORAL | 0 refills | Status: DC

## 2017-07-29 NOTE — Progress Notes (Signed)
BH MD/PA/NP OP Progress Note  07/29/2017 2:03 PM [name]  MRN:  [id number]  Chief Complaint:  I am under a lot of stress.  My living situation is getting worse.  I may have to move to a new place.  I'm not sleeping good.  HPI: Patient came for her follow-up appointment.  She is under a lot of stress because her landlord is changed and the new management company requires rent on time and she is behind her rent and upset they are charging late fees.  She owe them more than thousand dollars.  She is very stressed and not sure what to do.  She may be thinking to move out or she does not have money and resources for a new place.  She admitted poor sleep, irritability, racing thoughts and frustration.  She tried Vistaril but did not help her.  She is taking Wellbutrin and Remeron.  She endorsed lately poor appetite and she's been feeling isolated, withdrawn and nervous.  Though she denies any suicidal thoughts or homicidal thoughts but admitted feeling sad and his appointment.  She denies any hallucination, paranoia or any aggressive behavior.  She denies any mania or psychosis.  Her energy level is fair.  She lives by herself.  She is in touch with her daughter every day but difficult to see her because she lives in [place].  Patient denies drinking alcohol or using any illegal substances.  She has no tremors, shakes or any EPS.  Visit Diagnosis:    ICD-10-CM   1. Major depressive disorder, recurrent episode, mild (HCC) F33.0 mirtazapine (REMERON) 30 MG tablet    buPROPion (WELLBUTRIN XL) 300 MG 24 hr tablet    divalproex (DEPAKOTE) 250 MG DR tablet    Past Psychiatric History: Reviewed. She has history of psychiatric inpatient treatment at [facility]. She was admitted to get detox from her Xanax. Patient also seeing psychiatrist when she was in [employer] in [place] in [place] and than [facility]. In the past she had tried Paxil, Prozac, vistaril and  Zoloft. Patient denies any history of suicidal attempt or any psychosis.  Past Medical History:  Past Medical History:  Diagnosis Date  . Adenomatous colon polyp   . Anxiety   . Asthma   . Back pain with radiation    radiopathy   . Carpal tunnel syndrome 10/27/2010  . COPD (chronic obstructive pulmonary disease) ([place])   . Corneal abrasion, left 04/28/2013  . Depression   . GERD (gastroesophageal reflux disease)   . Hx of migraines   . Hyperlipidemia   . Hypertension   . Nicotine addiction   . PANIC ATTACK 11/05/2007  . PELVIC  PAIN 08/13/2010  . Urinary incontinence     Past Surgical History:  Procedure Laterality Date  . bleeding ulcer repair  1992   Open Ex Lap with perforation of duodenal ulcer with pertitonitis  . CHOLECYSTECTOMY  1999  . HIATAL HERNIA REPAIR  1967  . left ankle surgery  2008  . removal of mild ducts from left breast  1982  . SIMPLE MASTECTOMY Bilateral 06/04/2010  . TOTAL ABDOMINAL HYSTERECTOMY W/ BILATERAL SALPINGOOPHORECTOMY  1989  . TUBAL LIGATION  1975, 1978   x 2    Family Psychiatric History: Reviewed.  Family History:  Family History  Problem Relation Age of Onset  . Heart disease Mother   . Diabetes Mother   . Lupus Mother   . Colon polyps Mother   . Tuberculosis  Father   . Depression Sister   . Diabetes Brother   . Colon polyps Maternal Aunt   . Heart disease Maternal Grandmother   . Heart attack Maternal Aunt   . Stomach cancer Sister   . Endometriosis Daughter   . Diabetes Daughter   . Colon cancer Neg Hx   . Esophageal cancer Neg Hx   . Pancreatic cancer Neg Hx   . Rectal cancer Neg Hx     Social History:  Social History   Social History  . Marital status: Legally Separated    Spouse name: N/A  . Number of children: 3  . Years of education: N/A   Occupational History  . retired    Social History Main Topics  . Smoking status: Former Smoker    Packs/day: 0.25    Years: 42.00    Types: Cigarettes    Quit date:  07/06/2017  . Smokeless tobacco: Never Used  . Alcohol use No  . Drug use: No  . Sexual activity: No   Other Topics Concern  . None   Social History Narrative  . None    Allergies:  Allergies  Allergen Reactions  . Aspirin Other (See Comments)    Dr told pt not to take asa d/t bleeding ulcer in 1985.  [name] Penicillins Nausea Only    Tablets only.  Denies any airway involvement.    . Latex Rash    Relieved with over the counter medications.    Metabolic Disorder Labs: Lab Results  Component Value Date   HGBA1C 6.0 04/28/2013   MPG 126 (H) 01/20/2012   No results found for: PROLACTIN Lab Results  Component Value Date   CHOL 219 (H) 09/04/2016   TRIG 132 09/04/2016   HDL 40 (L) 09/04/2016   CHOLHDL 5.5 (H) 09/04/2016   VLDL 26 09/04/2016   LDLCALC 153 (H) 09/04/2016   LDLCALC 107 (H) 05/17/2015   Lab Results  Component Value Date   TSH 0.56 09/04/2016   TSH 1.793 12/08/2013    Therapeutic Level Labs: No results found for: LITHIUM No results found for: VALPROATE No components found for:  CBMZ  Current Medications: Current Outpatient Prescriptions  Medication Sig Dispense Refill  . albuterol (PROAIR HFA) 108 (90 Base) MCG/ACT inhaler Inhale 2 puffs into the lungs as directed. 1 Inhaler 3  . atorvastatin (LIPITOR) 40 MG tablet Take 1 tablet (40 mg total) by mouth daily. 90 tablet 3  . budesonide-formoterol (SYMBICORT) 80-4.5 MCG/ACT inhaler Inhale 2 puffs into the lungs 2 (two) times daily. 1 Inhaler 3  . buPROPion (WELLBUTRIN XL) 300 MG 24 hr tablet Take 1 tablet (300 mg total) by mouth daily. 90 tablet 0  . cetirizine (ZYRTEC) 10 MG tablet Take 1 tablet (10 mg total) by mouth daily. 30 tablet 11  . cloNIDine (CATAPRES) 0.3 MG tablet TAKE 1 AND 1/2 TABLETS BY MOUTH AT BEDTIME 135 tablet 0  . fluticasone (FLONASE) 50 MCG/ACT nasal spray Place 2 sprays into both nostrils daily. 16 g 6  . hydrochlorothiazide (HYDRODIURIL) 12.5 MG tablet Take 1 tablet (12.5 mg total)  by mouth daily. 30 tablet 5  . hydrOXYzine (VISTARIL) 25 MG capsule TAKE 1 CAPSULE BY MOUTH  DAILY AS NEEDED FOR ANXIETY 90 capsule 0  . LINZESS 290 MCG CAPS capsule TAKE 1 CAPSULE BY MOUTH  DAILY BEFORE BREAKFAST 90 capsule 0  . mirtazapine (REMERON) 30 MG tablet Take 1 tablet (30 mg total) by mouth at bedtime. 90 tablet 0  . pantoprazole (  PROTONIX) 40 MG tablet TAKE 1 TABLET BY MOUTH  DAILY 90 tablet 0  . potassium chloride SA (K-DUR,KLOR-CON) 20 MEQ tablet TAKE 1 TABLET BY MOUTH DAILY 30 tablet 0  . ranitidine (ZANTAC) 150 MG tablet Take 1 tablet (150 mg total) by mouth at bedtime. 90 tablet 0  . topiramate (TOPAMAX) 25 MG tablet Take 1 tablet (25 mg total) by mouth 2 (two) times daily. 60 tablet 5  . valACYclovir (VALTREX) 500 MG tablet Take 1 tablet (500 mg total) by mouth 2 (two) times daily. x3 days as needed for flares 30 tablet 0   No current facility-administered medications for this visit.      Musculoskeletal: Strength & Muscle Tone: within normal limits Gait & Station: normal Patient leans: N/A  Psychiatric Specialty Exam: Review of Systems  Constitutional: Negative.   HENT: Negative.   Respiratory: Negative.   Cardiovascular: Negative.   Musculoskeletal: Positive for back pain and joint pain.  Skin: Negative.  Negative for itching and rash.  Neurological: Negative.   Psychiatric/Behavioral: Positive for depression. The patient is nervous/anxious and has insomnia.     Blood pressure 130/76, pulse 65, height 5' (1.524 m), weight 154 lb 6.4 oz (70 kg).Body mass index is 30.15 kg/m.  General Appearance: Casual  Eye Contact:  Good  Speech:  Clear and Coherent  Volume:  Normal  Mood:  Anxious  Affect:  Constricted  Thought Process:  Goal Directed  Orientation:  Full (Time, Place, and Person)  Thought Content: Logical and Rumination   Suicidal Thoughts:  No  Homicidal Thoughts:  No  Memory:  Immediate;   Good Recent;   Good Remote;   Good  Judgement:  Good   Insight:  Good  Psychomotor Activity:  Decreased  Concentration:  Concentration: Good and Attention Span: Good  Recall:  Good  Fund of Knowledge: Good  Language: Good  Akathisia:  No  Handed:  Right  AIMS (if indicated): not done  Assets:  Communication Skills Desire for Improvement Housing  ADL's:  Intact  Cognition: WNL  Sleep:  Fair   Screenings: PHQ2-9     Office Visit from 10/07/2016 in [facility] Office Visit from 09/04/2016 in [facility] from 08/12/2016 in [facility] Office Visit from 08/01/2016 in [facility] Office Visit from 02/29/2016 in [facility]  PHQ-2 Total Score  0  0  2  0  0  PHQ-9 Total Score  -  -  6  -  -       Assessment and Plan: Major depressive disorder, recurrent.  Anxiety disorder NOS.  Reassurance given.  I will discontinue Vistaril since it is not helping her sleep.  We will try Depakote 250 mg at bedtime to help insomnia, mood lability.  She is complaining of chronic back pain and she is scheduled to see primary care physician tomorrow.  Continue Wellbutrin XL 300 mg daily, Remeron 30 mg at bedtime.  Discussed medication side effects and benefits.  Recommended to call us back if she has any question, concern or if she feels worsening of the symptom.  I recommended counseling but patient refuses.  Follow-up in 3 months.  Time spent 25 minutes.   ARFEEN,SYED T., MD 07/29/2017, 2:03 PM

## 2017-07-30 ENCOUNTER — Ambulatory Visit (INDEPENDENT_AMBULATORY_CARE_PROVIDER_SITE_OTHER): Payer: Medicare Other | Admitting: Family Medicine

## 2017-07-30 ENCOUNTER — Encounter: Payer: Self-pay | Admitting: Family Medicine

## 2017-07-30 VITALS — BP 144/88 | HR 69 | Temp 98.3°F | Ht 60.0 in | Wt 150.6 lb

## 2017-07-30 DIAGNOSIS — M5416 Radiculopathy, lumbar region: Secondary | ICD-10-CM | POA: Diagnosis not present

## 2017-07-30 DIAGNOSIS — R109 Unspecified abdominal pain: Secondary | ICD-10-CM | POA: Diagnosis not present

## 2017-07-30 DIAGNOSIS — R29818 Other symptoms and signs involving the nervous system: Secondary | ICD-10-CM | POA: Diagnosis not present

## 2017-07-30 LAB — POCT URINALYSIS DIP (MANUAL ENTRY)
BILIRUBIN UA: NEGATIVE mg/dL
Glucose, UA: 100 mg/dL — AB
LEUKOCYTES UA: NEGATIVE
NITRITE UA: NEGATIVE
PH UA: 5.5 (ref 5.0–8.0)
RBC UA: NEGATIVE
Spec Grav, UA: 1.025 (ref 1.010–1.025)
Urobilinogen, UA: 0.2 E.U./dL

## 2017-07-30 NOTE — Patient Instructions (Addendum)
It was nice meeting you today [name]!  We would like to get an MRI of your spine as well as a CT scan of your head.  These may help [place] see if you have a process in your spine or brain that could be causing your symptoms.    We will also refer you to neurology because they may be better able to treat your weakness  We will let you know what the results of your urine test are in the next few days.  Please use Tylenol for pain relief.  Please return in six months for a check up or earlier if you need to.  If you have any questions or concerns, please feel free to call the clinic.   Be well,  Dr. Shan Levans
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
## 2017-07-30 NOTE — Assessment & Plan Note (Signed)
Patient likely has impingement on nerves due to lumbar osteoarthritis or spinal stenosis; however, the upper extremity weakness does not fit this picture.  We have advised patient to use Tylenol for pain control, and a UA was ordered to rule out kidney stones due to location in right flank.  Concern for stroke is low, but she reports a daughter that had several mini-strokes by the age of 76, so there is a possible genetic component increasing her risk.  We will obtain an MRI of her lumbar spine, a CT scan of her head, and have her follow up with neurology to see if they can better help address her symptoms.

## 2017-07-31 LAB — BASIC METABOLIC PANEL
BUN / CREAT RATIO: 7 — AB (ref 12–28)
BUN: 8 mg/dL (ref 8–27)
CO2: 21 mmol/L (ref 20–29)
Calcium: 9.8 mg/dL (ref 8.7–10.3)
Chloride: 103 mmol/L (ref 96–106)
Creatinine, Ser: 1.16 mg/dL — ABNORMAL HIGH (ref 0.57–1.00)
GFR, EST AFRICAN AMERICAN: 56 mL/min/{1.73_m2} — AB (ref >59)
GFR, EST NON AFRICAN AMERICAN: 49 mL/min/{1.73_m2} — AB (ref >59)
Glucose: 190 mg/dL — ABNORMAL HIGH (ref 65–99)
POTASSIUM: 3.5 mmol/L (ref 3.5–5.2)
SODIUM: 145 mmol/L — AB (ref 134–144)

## 2017-08-03 ENCOUNTER — Telehealth: Payer: Self-pay | Admitting: Family Medicine

## 2017-08-03 NOTE — Telephone Encounter (Signed)
Called patient to let her know that her BMP and urinalysis were essentially unchanged from prior tests and that her results were not concerning.  Encouraged patient to return in a few months for a check up and to make an earlier appointment if she needed to.  Patient voiced understanding.

## 2017-08-07 ENCOUNTER — Ambulatory Visit (HOSPITAL_COMMUNITY): Admission: RE | Admit: 2017-08-07 | Payer: Medicare Other | Source: Ambulatory Visit

## 2017-08-07 ENCOUNTER — Ambulatory Visit: Payer: Self-pay | Admitting: Internal Medicine

## 2017-08-07 ENCOUNTER — Ambulatory Visit (HOSPITAL_COMMUNITY): Payer: Medicare Other

## 2017-08-30 ENCOUNTER — Other Ambulatory Visit: Payer: Self-pay | Admitting: Family Medicine

## 2017-08-30 DIAGNOSIS — B009 Herpesviral infection, unspecified: Secondary | ICD-10-CM

## 2017-09-01 ENCOUNTER — Telehealth: Payer: Self-pay | Admitting: Internal Medicine

## 2017-09-01 NOTE — Telephone Encounter (Signed)
Left voicemail advising patient that she needs to schedule follow up appointment with Dr Hilarie Fredrickson and that she will also need to let me know which medications she needs specifically.

## 2017-09-07 ENCOUNTER — Telehealth: Payer: Self-pay | Admitting: Internal Medicine

## 2017-09-07 MED ORDER — RANITIDINE HCL 150 MG PO TABS: 150.0000 mg | ORAL_TABLET | Freq: Every day | ORAL | 0 refills | Status: DC

## 2017-09-07 MED ORDER — PANTOPRAZOLE SODIUM 40 MG PO TBEC: 40.0000 mg | DELAYED_RELEASE_TABLET | Freq: Every day | ORAL | 0 refills | Status: DC

## 2017-09-07 MED ORDER — LINACLOTIDE 290 MCG PO CAPS: ORAL_CAPSULE | ORAL | 0 refills | Status: DC

## 2017-09-07 NOTE — Telephone Encounter (Signed)
Patient has scheduled an appointment with Dr Hilarie Fredrickson and refills have been sent for ranitidine, Linzess and Protonix until her appointment on 11/04/17.

## 2017-09-11 ENCOUNTER — Other Ambulatory Visit: Payer: Self-pay

## 2017-09-11 ENCOUNTER — Ambulatory Visit (INDEPENDENT_AMBULATORY_CARE_PROVIDER_SITE_OTHER): Payer: Medicare Other | Admitting: Family Medicine

## 2017-09-11 ENCOUNTER — Encounter: Payer: Self-pay | Admitting: Family Medicine

## 2017-09-11 DIAGNOSIS — J449 Chronic obstructive pulmonary disease, unspecified: Secondary | ICD-10-CM

## 2017-09-11 DIAGNOSIS — B009 Herpesviral infection, unspecified: Secondary | ICD-10-CM | POA: Diagnosis present

## 2017-09-11 DIAGNOSIS — I10 Essential (primary) hypertension: Secondary | ICD-10-CM | POA: Diagnosis not present

## 2017-09-11 MED ORDER — TOPIRAMATE 25 MG PO TABS: 25.0000 mg | ORAL_TABLET | Freq: Two times a day (BID) | ORAL | 5 refills | Status: DC

## 2017-09-11 MED ORDER — VALACYCLOVIR HCL 500 MG PO TABS: 500.0000 mg | ORAL_TABLET | Freq: Two times a day (BID) | ORAL | 0 refills | Status: DC

## 2017-09-11 MED ORDER — ALBUTEROL SULFATE HFA 108 (90 BASE) MCG/ACT IN AERS: 2.0000 | INHALATION_SPRAY | RESPIRATORY_TRACT | 3 refills | Status: DC

## 2017-09-11 MED ORDER — LIDOCAINE VISCOUS 2 % MT SOLN: 20.0000 mL | OROMUCOSAL | 0 refills | Status: DC | PRN

## 2017-09-11 MED ORDER — CLONIDINE HCL 0.3 MG PO TABS: ORAL_TABLET | ORAL | 0 refills | Status: DC

## 2017-09-11 MED ORDER — POTASSIUM CHLORIDE CRYS ER 20 MEQ PO TBCR: 20.0000 meq | EXTENDED_RELEASE_TABLET | Freq: Every day | ORAL | 0 refills | Status: DC

## 2017-09-11 MED ORDER — BUDESONIDE-FORMOTEROL FUMARATE 80-4.5 MCG/ACT IN AERO: 2.0000 | INHALATION_SPRAY | Freq: Two times a day (BID) | RESPIRATORY_TRACT | 3 refills | Status: DC

## 2017-09-11 MED ORDER — HYDROCHLOROTHIAZIDE 12.5 MG PO TABS: 12.5000 mg | ORAL_TABLET | Freq: Every day | ORAL | 5 refills | Status: DC

## 2017-09-11 MED FILL — VENTOLIN HFA 90 MCG INHALER: 108 (90 BAS | 25 days supply | Qty: 18 | Fill #0

## 2017-09-11 MED FILL — VALACYCLOVIR HCL 500 MG TAB: 500 | 15 days supply | Qty: 30 | Fill #0

## 2017-09-11 MED FILL — POTASSIUM CL ER 20 MEQ TABL: 20 | 30 days supply | Qty: 30 | Fill #0

## 2017-09-11 MED FILL — cloNIDine HCL 0.3 MG TABS: 0.3 | 90 days supply | Qty: 135 | Fill #0

## 2017-09-11 MED FILL — TOPIRAMATE 25 MG TAB: 25 | 30 days supply | Qty: 60 | Fill #0

## 2017-09-11 MED FILL — LIDOCAINE 2% VISCOUS SOLN: 2 | 5 days supply | Qty: 100 | Fill #0

## 2017-09-11 MED FILL — HYDROCHLOROTHIAZIDE 12.5 MG: 12.5 | 30 days supply | Qty: 30 | Fill #0
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
## 2017-09-11 NOTE — Assessment & Plan Note (Signed)
Patient was hypertensive today with BP of 170/100.  It is concerning that she stopped clonidine cold [place], so I will send a new prescription for clonidine to [facility] using the indigent fund.  I reordered her other missing prescriptions as well.

## 2017-09-11 NOTE — Assessment & Plan Note (Addendum)
I am unsure if this is a current herpes outbreak given her unremarkable GU exam, but since she has a history of HSV2 and reports that this feels similar to her other outbreaks with lesions over the past two weeks, I will prescribe Valtrex 500 mg BID x 5 days for this presumed recurrent infection.  Will also prescribe topical viscous lidocaine for pain relief.  These prescriptions were sent to [place] to be filled using the indigent fund.  I also advised the patient that clobetasol will not be helpful for this pain and that it should not be used for this reason.

## 2017-09-11 NOTE — Patient Instructions (Signed)
It was a pleasure to see you today! Thank you for choosing [facility] for your primary care. [name] was seen for Medication refills and treatment for herpes outbreak.   Our plans for today were:  Please take these prescriptions to [place].  Please continue taking them as prescribed.  Please take Valtrex twice daily for 5 days to clear up your outbreak.  Please use lidocaine for relief of your pain.  You should return to our clinic to see me in one month for a blood pressure and medication check.   Best,  Dr. Shan Levans

## 2017-09-25 ENCOUNTER — Ambulatory Visit: Payer: Medicare Other | Admitting: Family Medicine

## 2017-10-08 ENCOUNTER — Other Ambulatory Visit: Payer: Self-pay | Admitting: Family Medicine

## 2017-10-15 ENCOUNTER — Other Ambulatory Visit: Payer: Self-pay

## 2017-10-15 ENCOUNTER — Encounter: Payer: Self-pay | Admitting: Obstetrics & Gynecology

## 2017-10-15 ENCOUNTER — Ambulatory Visit (INDEPENDENT_AMBULATORY_CARE_PROVIDER_SITE_OTHER): Payer: Medicare Other | Admitting: Obstetrics & Gynecology

## 2017-10-15 DIAGNOSIS — B009 Herpesviral infection, unspecified: Secondary | ICD-10-CM | POA: Diagnosis present

## 2017-10-15 MED ORDER — VALACYCLOVIR HCL 500 MG PO TABS: 500.0000 mg | ORAL_TABLET | Freq: Two times a day (BID) | ORAL | 3 refills | Status: DC

## 2017-10-15 NOTE — Patient Instructions (Signed)

## 2017-10-15 NOTE — Progress Notes (Signed)
History:  66 y.o. No obstetric history on file. here today for an HSV outbreak. Pt reports that she has been seen 2x by her primary care provider with complaints of an outbreak and reports that the provider says that her exam was neg.  Pt is frustrated as she sees lesions. She was originally dx'd with HSV by swab and blood work about 4-5 years prev. She has not had an outbreak since that time.   The following portions of the patient's history were reviewed and updated as appropriate: allergies, current medications, past family history, past medical history, past social history, past surgical history and problem list.  Review of Systems:  Pertinent items are noted in HPI.   Objective:  Physical Exam Blood pressure (!) 213/112, pulse 82, weight 158 lb 1.6 oz (71.7 kg).  Repeat BP 164/93  CONSTITUTIONAL: Well-developed, well-nourished female in no acute distress.  HENT:  Normocephalic, atraumatic EYES: Conjunctivae and EOM are normal. No scleral icterus.  NECK: Normal range of motion SKIN: Skin is warm and dry. No rash noted. Not diaphoretic.No pallor. [place]: Alert and oriented to person, place, and time. Normal coordination.  Pelvic: Multiple vesicals around the clitoris and on the labia minora c/w genital herpes  Assessment & Plan:  Valtrex 500mg  bid x 5 days with refill x 3 F/u in 6 weeks to confirm resolution Elevated BP- much better upon repeat. Pt on meds. Will f/u with primary care for BP management.   Total face-to-face time with patient was 20 min.  Greater than 50% was spent in counseling and coordination of care with the patient.   Darrly Loberg L. Harraway-Smith, M.D., [place]

## 2017-10-15 NOTE — Progress Notes (Signed)
She is very upset!

## 2017-10-16 ENCOUNTER — Encounter: Payer: Self-pay | Admitting: Obstetrics & Gynecology

## 2017-10-22 DIAGNOSIS — H02825 Cysts of left lower eyelid: Secondary | ICD-10-CM | POA: Diagnosis not present

## 2017-10-26 ENCOUNTER — Other Ambulatory Visit (HOSPITAL_COMMUNITY): Payer: Self-pay

## 2017-10-26 DIAGNOSIS — F330 Major depressive disorder, recurrent, mild: Secondary | ICD-10-CM

## 2017-10-26 MED ORDER — MIRTAZAPINE 30 MG PO TABS: 30.0000 mg | ORAL_TABLET | Freq: Every day | ORAL | 0 refills | Status: DC

## 2017-10-26 MED ORDER — BUPROPION HCL ER (XL) 300 MG PO TB24: 300.0000 mg | ORAL_TABLET | Freq: Every day | ORAL | 0 refills | Status: DC

## 2017-10-26 MED ORDER — DIVALPROEX SODIUM 250 MG PO DR TAB: 250.0000 mg | DELAYED_RELEASE_TABLET | Freq: Every day | ORAL | 0 refills | Status: DC

## 2017-10-29 ENCOUNTER — Ambulatory Visit (HOSPITAL_COMMUNITY): Payer: Self-pay | Admitting: Psychiatry

## 2017-11-04 ENCOUNTER — Ambulatory Visit (INDEPENDENT_AMBULATORY_CARE_PROVIDER_SITE_OTHER): Payer: Medicare Other | Admitting: Internal Medicine

## 2017-11-04 ENCOUNTER — Encounter: Payer: Self-pay | Admitting: Internal Medicine

## 2017-11-04 ENCOUNTER — Encounter (INDEPENDENT_AMBULATORY_CARE_PROVIDER_SITE_OTHER): Payer: Self-pay

## 2017-11-04 VITALS — BP 156/94 | HR 68 | Ht 61.75 in | Wt 157.1 lb

## 2017-11-04 DIAGNOSIS — Z860100 Personal history of colon polyps, unspecified: Secondary | ICD-10-CM

## 2017-11-04 DIAGNOSIS — K219 Gastro-esophageal reflux disease without esophagitis: Secondary | ICD-10-CM

## 2017-11-04 DIAGNOSIS — K5909 Other constipation: Secondary | ICD-10-CM

## 2017-11-04 MED ORDER — LINACLOTIDE 290 MCG PO CAPS: ORAL_CAPSULE | ORAL | 2 refills | Status: DC

## 2017-11-04 MED ORDER — PANTOPRAZOLE SODIUM 40 MG PO TBEC: 40.0000 mg | DELAYED_RELEASE_TABLET | Freq: Every day | ORAL | 2 refills | Status: DC

## 2017-11-04 MED ORDER — RANITIDINE HCL 150 MG PO TABS: 150.0000 mg | ORAL_TABLET | Freq: Every day | ORAL | 2 refills | Status: DC

## 2017-11-04 NOTE — Progress Notes (Signed)
   Subjective:    Patient ID: [name], female    DOB: [birth year], 67 y.o.   MRN: [id number]  HPI [name] is a 67 yo female with a past medical history of chronic constipation, GERD and colon polyps who is here for follow-up.  She was last seen in February 2018.  She reports that she is been doing very well from a GI perspective.  She is continued Linzess 290 mcg daily with excellent results.  No constipation, diarrhea.  No abdominal pain.  No blood in her stool or melena.  Reflux is been mostly controlled with pantoprazole 40 mg in the morning and ranitidine 150 mg in the evening.  Some breakthrough heartburn occasionally when she eats spicy foods or drinks coffee so she has been avoiding these medications.  No new complaints.   Review of Systems As per HPI, otherwise normal  Current Medications, Allergies, Past Medical History, Past Surgical History, Family History and Social History were reviewed in [employer] record.     Objective:   Physical Exam BP (!) 156/94 (BP Location: Left Arm, Patient Position: Sitting, Cuff Size: Normal)   Pulse 68   Ht 5' 1.75" (1.568 m) Comment: height measured without shoes  Wt 157 lb 2 oz (71.3 kg)   BMI 28.97 kg/m  Constitutional: Well-developed and well-nourished. No distress. HEENT: Normocephalic and atraumatic. Conjunctivae are normal.  No scleral icterus. Neck: Neck supple. Trachea midline. Cardiovascular: Normal rate, regular rhythm and intact distal pulses.  Pulmonary/chest: Effort normal and breath sounds normal. No wheezing, rales or rhonchi. Abdominal: Soft, nontender, nondistended. Bowel sounds active throughout. T Extremities: no clubbing, cyanosis, or edema Neurological: Alert and oriented to person place and time. Skin: Skin is warm and dry. Psychiatric: Normal mood and affect. Behavior is normal.      Assessment & Plan:  67 yo female with a past medical history of chronic  constipation, GERD and colon polyps who is here for follow-up.   1.  Chronic constipation --doing well, continue Linzess 290 mcg daily.  2.  GERD --stable without alarm symptoms.  Continue pantoprazole 40 mg every morning every evening.  3.  History of colon polyps --surveillance colonoscopy recommended October 2020  Annual follow-up  15 minutes spent with the patient today. Greater than 50% was spent in counseling and coordination of care with the patient

## 2017-11-04 NOTE — Patient Instructions (Signed)
We have sent the following medications to your pharmacy for you to pick up at your convenience: Linzess Pantoprazole Ranitidine  Please follow up with Dr Hilarie Fredrickson in 1 year.  If you are age 68 or older, your body mass index should be between 23-30. Your Body mass index is 28.97 kg/m. If this is out of the aforementioned range listed, please consider follow up with your Primary Care Provider.  If you are age 69 or younger, your body mass index should be between 19-25. Your Body mass index is 28.97 kg/m. If this is out of the aformentioned range listed, please consider follow up with your Primary Care Provider.

## 2017-11-17 DIAGNOSIS — L720 Epidermal cyst: Secondary | ICD-10-CM | POA: Diagnosis not present

## 2017-11-17 DIAGNOSIS — D492 Neoplasm of unspecified behavior of bone, soft tissue, and skin: Secondary | ICD-10-CM | POA: Diagnosis not present

## 2017-11-18 ENCOUNTER — Ambulatory Visit (INDEPENDENT_AMBULATORY_CARE_PROVIDER_SITE_OTHER): Payer: Medicare Other | Admitting: Psychiatry

## 2017-11-18 VITALS — BP 148/84 | HR 80 | Ht 61.0 in | Wt 157.0 lb

## 2017-11-18 DIAGNOSIS — F330 Major depressive disorder, recurrent, mild: Secondary | ICD-10-CM | POA: Diagnosis not present

## 2017-11-18 DIAGNOSIS — Z87891 Personal history of nicotine dependence: Secondary | ICD-10-CM | POA: Diagnosis not present

## 2017-11-18 DIAGNOSIS — N959 Unspecified menopausal and perimenopausal disorder: Secondary | ICD-10-CM

## 2017-11-18 DIAGNOSIS — Z818 Family history of other mental and behavioral disorders: Secondary | ICD-10-CM

## 2017-11-18 DIAGNOSIS — F419 Anxiety disorder, unspecified: Secondary | ICD-10-CM | POA: Diagnosis not present

## 2017-11-18 MED ORDER — BUPROPION HCL ER (XL) 300 MG PO TB24: 300.0000 mg | ORAL_TABLET | ORAL | 0 refills | Status: DC

## 2017-11-18 MED ORDER — MIRTAZAPINE 30 MG PO TABS: 30.0000 mg | ORAL_TABLET | Freq: Every day | ORAL | 0 refills | Status: DC

## 2017-11-18 MED ORDER — DIVALPROEX SODIUM 250 MG PO DR TAB: 250.0000 mg | DELAYED_RELEASE_TABLET | Freq: Every day | ORAL | 0 refills | Status: DC

## 2017-11-18 NOTE — Progress Notes (Signed)
[place] MD/PA/NP OP Progress Note  11/18/2017 1:23 PM [name]  MRN:  [id number]  Chief Complaint: I had a good Christmas.  HPI: Patient came for her follow-up appointment.  She moved to a new place and she is feeling much better.  She denies any anxiety or panic attack.  She had a good Christmas.  Recently she seen her primary care physician.  She also seeing GI for her stomach issues.  She wants to continue her current psychiatric medication.  She admitted her irritability, anger and mood swings are much better.  She is sleeping better with the Depakote and she does not have any frustration or any racing thoughts.  She stopped taking Vistaril.  She has no tremors shakes or any EPS.  Her appetite is okay.  She denies any paranoia, hallucination, suicidal thoughts.  She is not drinking or using any illegal substances.    Visit Diagnosis:    ICD-10-CM   1. Major depressive disorder, recurrent episode, mild (HCC) F33.0 divalproex (DEPAKOTE) 250 MG DR tablet    buPROPion (WELLBUTRIN XL) 300 MG 24 hr tablet    mirtazapine (REMERON) 30 MG tablet    DISCONTINUED: mirtazapine (REMERON) 30 MG tablet    Past Psychiatric History: Reviewed. She has history of psychiatric inpatient treatment at [facility]. She was admitted to get detox from her Xanax. Patient also seeing psychiatrist when she was in [employer] in [place] in [place] and than [facility]. In the past she had tried Paxil, Prozac, vistaril and Zoloft. Patient denies any history of suicidal attempt or any psychosis.  Past Medical History:  Past Medical History:  Diagnosis Date  . Adenomatous colon polyp   . Anxiety   . Asthma   . Back pain with radiation    radiopathy   . Carpal tunnel syndrome 10/27/2010  . COPD (chronic obstructive pulmonary disease) ([place])   . Corneal abrasion, left 04/28/2013  . Depression   . GERD (gastroesophageal reflux disease)   . Hx of migraines   . Hyperlipidemia   .  Hypertension   . Nicotine addiction   . PANIC ATTACK 11/05/2007  . PELVIC  PAIN 08/13/2010  . Urinary incontinence     Past Surgical History:  Procedure Laterality Date  . bleeding ulcer repair  1992   Open Ex Lap with perforation of duodenal ulcer with pertitonitis  . CHOLECYSTECTOMY  1999  . HIATAL HERNIA REPAIR  1967  . left ankle surgery  2008  . removal of mild ducts from left breast  1982  . SIMPLE MASTECTOMY Bilateral 06/04/2010  . TOTAL ABDOMINAL HYSTERECTOMY W/ BILATERAL SALPINGOOPHORECTOMY  1989  . TUBAL LIGATION  1975, 1978   x 2    Family Psychiatric History: Viewed  Family History:  Family History  Problem Relation Age of Onset  . Heart disease Mother   . Diabetes Mother   . Lupus Mother   . Colon polyps Mother   . Tuberculosis Father   . Depression Sister   . Diabetes Brother   . Colon polyps Maternal Aunt   . Heart disease Maternal Grandmother   . Heart attack Maternal Aunt   . Stomach cancer Sister   . Endometriosis Daughter   . Diabetes Daughter   . Colon cancer Neg Hx   . Esophageal cancer Neg Hx   . Pancreatic cancer Neg Hx   . Rectal cancer Neg Hx     Social History:  Social History   Socioeconomic History  .  Marital status: Legally Separated    Spouse name: Not on file  . Number of children: 3  . Years of education: Not on file  . Highest education level: Not on file  Social Needs  . Financial resource strain: Not on file  . Food insecurity - worry: Not on file  . Food insecurity - inability: Not on file  . Transportation needs - medical: Not on file  . Transportation needs - non-medical: Not on file  Occupational History  . Occupation: retired  Tobacco Use  . Smoking status: Former Smoker    Packs/day: 0.25    Years: 42.00    Pack years: 10.50    Types: Cigarettes    Last attempt to quit: 07/06/2017    Years since quitting: 0.3  . Smokeless tobacco: Never Used  Substance and Sexual Activity  . Alcohol use: No    Alcohol/week:  0.0 oz  . Drug use: No  . Sexual activity: No    Birth control/protection: Post-menopausal  Other Topics Concern  . Not on file  Social History Narrative  . Not on file    Allergies:  Allergies  Allergen Reactions  . Aspirin Other (See Comments)    Dr told pt not to take asa d/t bleeding ulcer in 1985.  [name] Penicillins Nausea Only    Tablets only.  Denies any airway involvement.    . Latex Rash    Relieved with over the counter medications.    Metabolic Disorder Labs: Lab Results  Component Value Date   HGBA1C 6.0 04/28/2013   MPG 126 (H) 01/20/2012   No results found for: PROLACTIN Lab Results  Component Value Date   CHOL 219 (H) 09/04/2016   TRIG 132 09/04/2016   HDL 40 (L) 09/04/2016   CHOLHDL 5.5 (H) 09/04/2016   VLDL 26 09/04/2016   LDLCALC 153 (H) 09/04/2016   LDLCALC 107 (H) 05/17/2015   Lab Results  Component Value Date   TSH 0.56 09/04/2016   TSH 1.793 12/08/2013    Therapeutic Level Labs: No results found for: LITHIUM No results found for: VALPROATE No components found for:  CBMZ  Current Medications: Current Outpatient Medications  Medication Sig Dispense Refill  . albuterol (PROAIR HFA) 108 (90 Base) MCG/ACT inhaler Inhale 2 puffs as directed into the lungs. 1 Inhaler 3  . budesonide-formoterol (SYMBICORT) 80-4.5 MCG/ACT inhaler Inhale 2 puffs 2 (two) times daily into the lungs. 1 Inhaler 3  . buPROPion (WELLBUTRIN XL) 300 MG 24 hr tablet Take 1 tablet (300 mg total) by mouth every morning. 90 tablet 0  . cetirizine (ZYRTEC) 10 MG tablet Take 1 tablet (10 mg total) by mouth daily. 30 tablet 11  . cloNIDine (CATAPRES) 0.3 MG tablet TAKE 1 AND 1/2 TABLETS BY MOUTH AT BEDTIME 135 tablet 0  . divalproex (DEPAKOTE) 250 MG DR tablet Take 1 tablet (250 mg total) by mouth at bedtime. 90 tablet 0  . fluticasone (FLONASE) 50 MCG/ACT nasal spray Place 2 sprays into both nostrils daily. 16 g 6  . hydrochlorothiazide (HYDRODIURIL) 12.5 MG tablet Take 1 tablet  (12.5 mg total) daily by mouth. 30 tablet 5  . lidocaine (XYLOCAINE) 2 % solution Use as directed 20 mLs as needed in the mouth or throat for mouth pain. 100 mL 0  . linaclotide (LINZESS) 290 MCG CAPS capsule TAKE 1 CAPSULE BY MOUTH  DAILY BEFORE BREAKFAST 90 capsule 2  . mirtazapine (REMERON) 30 MG tablet Take 1 tablet (30 mg total) by mouth at bedtime.  90 tablet 0  . pantoprazole (PROTONIX) 40 MG tablet Take 1 tablet (40 mg total) by mouth daily. 90 tablet 2  . potassium chloride SA (K-DUR,KLOR-CON) 20 MEQ tablet Take 1 tablet (20 mEq total) daily by mouth. 30 tablet 0  . ranitidine (ZANTAC) 150 MG tablet Take 1 tablet (150 mg total) by mouth at bedtime. 90 tablet 2  . topiramate (TOPAMAX) 25 MG tablet Take 1 tablet (25 mg total) 2 (two) times daily by mouth. 60 tablet 5  . valACYclovir (VALTREX) 500 MG tablet Take 1 tablet (500 mg total) by mouth 2 (two) times daily. x5 days as needed for flares 30 tablet 3   No current facility-administered medications for this visit.      Musculoskeletal: Strength & Muscle Tone: within normal limits Gait & Station: normal Patient leans: N/A  Psychiatric Specialty Exam: ROS  Blood pressure (!) 148/84, pulse 80, height 5\' 1"  (1.549 m), weight 157 lb (71.2 kg).There is no height or weight on file to calculate BMI.  General Appearance: Casual  Eye Contact:  Good  Speech:  Clear and Coherent  Volume:  Normal  Mood:  Euthymic  Affect:  Appropriate  Thought Process:  Goal Directed  Orientation:  Full (Time, Place, and Person)  Thought Content: Logical   Suicidal Thoughts:  No  Homicidal Thoughts:  No  Memory:  Immediate;   Good Recent;   Good Remote;   Good  Judgement:  Good  Insight:  Good  Psychomotor Activity:  Normal  Concentration:  Concentration: Good and Attention Span: Good  Recall:  Good  Fund of Knowledge: Good  Language: Good  Akathisia:  No  Handed:  Right  AIMS (if indicated): not done  Assets:  Communication Skills Desire  for Improvement Housing Resilience Social Support  ADL's:  Intact  Cognition: WNL  Sleep:  Good   Screenings: PHQ2-9     Office Visit from 09/11/2017 in [place] Office Visit from 10/07/2016 in [place] Office Visit from 09/04/2016 in [place] from 08/12/2016 in [place] Office Visit from 08/01/2016 in [place]  PHQ-2 Total Score  0  0  0  2  0  PHQ-9 Total Score  No data  No data  No data  6  No data       Assessment and Plan: Major depressive disorder, recurrent.  Anxiety disorder NOS.  Patient doing better on Depakote.  She is no longer taking Vistaril.  Her mood and irritability is improved.  Continue Remeron 30 mg at bedtime, Wellbutrin XL 300 mg daily and Depakote 250 mg at bedtime.  Discussed medication side effects and benefits.  Recommended to call us back if she has any question or any concern.  Follow-up in 3 months.   Kathlee Nations, MD 11/18/2017, 1:23 PM

## 2017-11-19 ENCOUNTER — Encounter (HOSPITAL_COMMUNITY): Payer: Self-pay | Admitting: Psychiatry

## 2017-12-07 ENCOUNTER — Other Ambulatory Visit: Payer: Self-pay | Admitting: Internal Medicine

## 2017-12-08 ENCOUNTER — Other Ambulatory Visit: Payer: Self-pay | Admitting: Internal Medicine

## 2017-12-24 ENCOUNTER — Other Ambulatory Visit: Payer: Self-pay | Admitting: Physician Assistant

## 2017-12-24 ENCOUNTER — Other Ambulatory Visit: Payer: Self-pay | Admitting: Family Medicine

## 2018-01-13 ENCOUNTER — Ambulatory Visit: Payer: Self-pay | Admitting: Family Medicine

## 2018-01-22 DIAGNOSIS — L300 Nummular dermatitis: Secondary | ICD-10-CM | POA: Diagnosis not present

## 2018-01-26 DIAGNOSIS — H16042 Marginal corneal ulcer, left eye: Secondary | ICD-10-CM | POA: Diagnosis not present

## 2018-01-28 DIAGNOSIS — H16042 Marginal corneal ulcer, left eye: Secondary | ICD-10-CM | POA: Diagnosis not present

## 2018-02-03 ENCOUNTER — Other Ambulatory Visit: Payer: Self-pay | Admitting: Family Medicine

## 2018-02-17 ENCOUNTER — Ambulatory Visit (HOSPITAL_COMMUNITY): Payer: Medicare Other | Admitting: Psychiatry

## 2018-02-23 DIAGNOSIS — H1013 Acute atopic conjunctivitis, bilateral: Secondary | ICD-10-CM | POA: Diagnosis not present

## 2018-02-23 DIAGNOSIS — H43811 Vitreous degeneration, right eye: Secondary | ICD-10-CM | POA: Diagnosis not present

## 2018-02-24 ENCOUNTER — Other Ambulatory Visit: Payer: Self-pay

## 2018-02-24 ENCOUNTER — Encounter: Payer: Self-pay | Admitting: Family Medicine

## 2018-02-24 ENCOUNTER — Ambulatory Visit (INDEPENDENT_AMBULATORY_CARE_PROVIDER_SITE_OTHER): Payer: 59 | Admitting: Family Medicine

## 2018-02-24 VITALS — BP 130/90 | HR 67 | Temp 98.1°F | Ht 61.0 in | Wt 150.0 lb

## 2018-02-24 DIAGNOSIS — L989 Disorder of the skin and subcutaneous tissue, unspecified: Secondary | ICD-10-CM | POA: Diagnosis not present

## 2018-02-24 DIAGNOSIS — Z8669 Personal history of other diseases of the nervous system and sense organs: Secondary | ICD-10-CM

## 2018-02-24 DIAGNOSIS — J302 Other seasonal allergic rhinitis: Secondary | ICD-10-CM

## 2018-02-24 DIAGNOSIS — E119 Type 2 diabetes mellitus without complications: Secondary | ICD-10-CM | POA: Diagnosis not present

## 2018-02-24 DIAGNOSIS — R7309 Other abnormal glucose: Secondary | ICD-10-CM

## 2018-02-24 DIAGNOSIS — Z0000 Encounter for general adult medical examination without abnormal findings: Secondary | ICD-10-CM | POA: Diagnosis not present

## 2018-02-24 DIAGNOSIS — Z23 Encounter for immunization: Secondary | ICD-10-CM

## 2018-02-24 DIAGNOSIS — I10 Essential (primary) hypertension: Secondary | ICD-10-CM | POA: Diagnosis not present

## 2018-02-24 DIAGNOSIS — E2839 Other primary ovarian failure: Secondary | ICD-10-CM

## 2018-02-24 DIAGNOSIS — M810 Age-related osteoporosis without current pathological fracture: Secondary | ICD-10-CM

## 2018-02-24 HISTORY — DX: Type 2 diabetes mellitus without complications: E11.9

## 2018-02-24 LAB — POCT GLYCOSYLATED HEMOGLOBIN (HGB A1C): HEMOGLOBIN A1C: 6.8

## 2018-02-24 MED ORDER — ALPRAZOLAM 0.25 MG PO TABS: 0.2500 mg | ORAL_TABLET | Freq: Every evening | ORAL | 0 refills | Status: DC | PRN

## 2018-02-24 MED ORDER — TOPIRAMATE 25 MG PO TABS: 25.0000 mg | ORAL_TABLET | Freq: Two times a day (BID) | ORAL | 5 refills | Status: DC

## 2018-02-24 MED ORDER — CETIRIZINE HCL 10 MG PO TABS: 10.0000 mg | ORAL_TABLET | Freq: Every day | ORAL | 11 refills | Status: DC

## 2018-02-24 MED ORDER — CLONIDINE HCL 0.3 MG PO TABS: ORAL_TABLET | ORAL | 0 refills | Status: DC

## 2018-02-24 MED ORDER — FLUTICASONE PROPIONATE 50 MCG/ACT NA SUSP
2.0000 | Freq: Every day | NASAL | 6 refills | Status: DC
Start: 2018-02-24 — End: 2018-02-24

## 2018-02-24 MED ORDER — METFORMIN HCL 500 MG PO TABS: 500.0000 mg | ORAL_TABLET | Freq: Two times a day (BID) | ORAL | 3 refills | Status: DC

## 2018-02-24 MED ORDER — CLOTRIMAZOLE 1 % EX CREA: 1.0000 "application " | TOPICAL_CREAM | Freq: Two times a day (BID) | CUTANEOUS | 0 refills | Status: DC

## 2018-02-24 MED ORDER — FLUTICASONE PROPIONATE 50 MCG/ACT NA SUSP: 2.0000 | Freq: Every day | NASAL | 6 refills | Status: DC

## 2018-02-24 NOTE — Assessment & Plan Note (Signed)
Will represcribe Flonase (patient has stopped taking this) and Zyrtec.

## 2018-02-24 NOTE — Assessment & Plan Note (Addendum)
Given second PNA 23 valent vaccine today.  Referred for DEXA scan.

## 2018-02-24 NOTE — Assessment & Plan Note (Signed)
Will refill Clonidine.

## 2018-02-24 NOTE — Progress Notes (Addendum)
   Subjective:    [name] - 67 y.o. female MRN [id number]  Date of birth: 13-Sep-1951  HPI  [name] is here for headaches.  They occur behind her eyes and have been going on for the past three months.  She wakes up with a headache most days.  She got an eye exam yesterday and was told that she had bad allergies.  She says that she sneezes a lot.  She also has run out of her Topomax, which is usually helpful for her.  She is also worried that she has diabetes because she is always thirsty and pees more frequently.  Her mother had these symptoms and was diagnosed with diabetes.  Health Maintenance:  - will refer for Dexa scan and give PNA shot today.  Patient will need to go to the pharmacy for Tetanus because of insurance limitations. Health Maintenance Due  Topic Date Due  . FOOT EXAM  05/08/1960  . OPHTHALMOLOGY EXAM  05/08/1960  . HEMOGLOBIN A1C  10/29/2013  . TETANUS/TDAP  06/12/2014  . DEXA SCAN  05/09/2015  . PNA vac Low Risk Adult (2 of 2 - PPSV23) 11/25/2016    -  reports that she quit smoking about 7 months ago. Her smoking use included cigarettes. She has a 10.50 pack-year smoking history. She has never used smokeless tobacco. - Review of Systems: Per HPI. - Past Medical History: Patient Active Problem List   Diagnosis Date Noted  . Type 2 diabetes, diet controlled ([name]) 02/24/2018  . HSV-2 (herpes simplex virus 2) infection 02/29/2016  . Seborrheic keratoses, inflamed 02/29/2016  . Vulvovaginal candidiasis 05/31/2015  . Itching in the vaginal area 05/17/2015  . Allergic rhinitis 04/25/2014  . Skin lesions 12/08/2013  . Major depressive disorder, recurrent episode, severe, without mention of psychotic behavior 09/08/2012  . Healthcare maintenance 06/13/2012  . Vitamin D deficiency 06/13/2012  . Sleep difficulties 06/13/2012  . Hyperlipidemia - Mixed 05/17/2012  . Migraine 01/20/2012  . COPD (chronic obstructive pulmonary disease) ([place])  03/05/2011  . BACK PAIN WITH RADICULOPATHY 08/17/2008  . Essential hypertension, benign 11/05/2007  . GERD 11/05/2007   - Medications: reviewed and updated   Objective:   Physical Exam BP 130/90   Pulse 67   Temp 98.1 F (36.7 C) (Oral)   Ht 5\' 1"  (1.549 m)   Wt 150 lb (68 kg)   SpO2 99%   BMI 28.34 kg/m  Gen: NAD, alert, cooperative with exam, well-appearing HEENT: NCAT, PERRL, slightly injected conjunctiva, oropharynx clear, supple neck, no lymphadenopathy, no rhinorrhea, no tenderness to palpation of frontal or maxillary sinuses CV: RRR, good S1/S2, no murmur, no edema Resp: CTABL, no wheezes, non-labored Abd: SNTND, BS present, no guarding or organomegaly Skin: no rashes, normal turgor  Neuro: no gross deficits.  Psych: good insight, alert and oriented        Assessment & Plan:   Allergic rhinitis Will represcribe Flonase (patient has stopped taking this) and Zyrtec.  Migraine Will refill Topomax.  Essential hypertension, benign Will refill Clonidine.  Skin lesions Refilled clotrimazole for intertrigo.  Type 2 diabetes, diet controlled ([name]) Diagnosed today with an A1c of 6.8.  Started Metformin 500 mg daily for one week then BID after that.  Patient will return in three months for repeat A1c check.  Healthcare maintenance Given second PNA 23 valent vaccine today.  Referred for DEXA scan.    Maia Breslow, M.D. 02/24/2018, 4:18 PM PGY-1, [place]

## 2018-02-24 NOTE — Assessment & Plan Note (Addendum)
Refilled clotrimazole for intertrigo.

## 2018-02-24 NOTE — Assessment & Plan Note (Signed)
Diagnosed today with an A1c of 6.8.  Started Metformin 500 mg daily for one week then BID after that.  Patient will return in three months for repeat A1c check.

## 2018-02-24 NOTE — Patient Instructions (Addendum)
It was nice meeting you today [name]!  Today, I refilled your Topomax, and we started two new medications that should help your allergies: Flonase (a nose spray) and Zyrtec (an allergy pill).  We also gave you the pneumonia vaccine and referred you for a DEXA scan to measure your bones.  We checked your blood sugar today.  We will contact you if this is abnormal.  Your Hemoglobin A1c today was 6.8, which means you have Diabetes.  Your Diabetes is well-controlled, and we want to keep it that way.  Please take metformin 1 pill per day for the next week, then take one pill in the morning and one pill in the evening after that.  Please call if you experience any diarrhea.  This medicine is excellent at keeping your sugar under control.   If you have any questions or concerns, please feel free to call the clinic.   Be well,  Dr. Shan Levans  Type 2 Diabetes Mellitus, Diagnosis, Adult Type 2 diabetes (type 2 diabetes mellitus) is a long-term (chronic) disease. It may be caused by one or both of these problems:  Your body does not make enough of a hormone called insulin.  Your body does not react in a normal way to insulin that it makes.  Insulin lets sugars (glucose) go into cells in the body. This gives you energy. If you have type 2 diabetes, sugars cannot get into cells. This causes high blood sugar (hyperglycemia). Your doctor will set treatment goals for you. Generally, you should have these blood sugar levels:  Before meals (preprandial): 80-130 mg/dL (4.4-7.2 mmol/L).  After meals (postprandial): below 180 mg/dL (10 mmol/L).  A1c (hemoglobin A1c) level: less than 7%.  Follow these instructions at home: Questions to Ask Your Doctor  You may want to ask these questions:  Do I need to meet with a diabetes educator?  Where can I find a support group for people with diabetes?  What equipment will I need to care for myself at home?  What diabetes medicines do I need? When  should I take them?  How often do I need to check my blood sugar?  What number can I call if I have questions?  When is my next doctor's visit?  General instructions  Take over-the-counter and prescription medicines only as told by your doctor.  Keep all follow-up visits as told by your doctor. This is important. Contact a doctor if:  Your blood sugar is at or above 240 mg/dL (13.3 mmol/L) for 2 days in a row.  You have been sick or have had a fever for 2 days or more and you are not getting better.  You have any of these problems for more than 6 hours: ? You cannot eat or drink. ? You feel sick to your stomach (nauseous). ? You throw up (vomit). ? You have watery poop (diarrhea). Get help right away if:  Your blood sugar is lower than 54 mg/dL (3 mmol/L).  You get confused.  You have trouble: ? Thinking clearly. ? Breathing.  You have moderate or large ketone levels in your pee (urine). This information is not intended to replace advice given to you by your health care provider. Make sure you discuss any questions you have with your health care provider. Document Released: 07/22/2008 Document Revised: 03/20/2016 Document Reviewed: 11/16/2015 Elsevier Interactive Patient Education  [employer].

## 2018-02-24 NOTE — Assessment & Plan Note (Signed)
Will refill Topomax.

## 2018-03-05 ENCOUNTER — Encounter (HOSPITAL_COMMUNITY): Payer: Self-pay | Admitting: Psychiatry

## 2018-03-05 ENCOUNTER — Ambulatory Visit (INDEPENDENT_AMBULATORY_CARE_PROVIDER_SITE_OTHER): Payer: 59 | Admitting: Psychiatry

## 2018-03-05 DIAGNOSIS — F330 Major depressive disorder, recurrent, mild: Secondary | ICD-10-CM

## 2018-03-05 DIAGNOSIS — Z818 Family history of other mental and behavioral disorders: Secondary | ICD-10-CM

## 2018-03-05 DIAGNOSIS — E119 Type 2 diabetes mellitus without complications: Secondary | ICD-10-CM

## 2018-03-05 DIAGNOSIS — Z87891 Personal history of nicotine dependence: Secondary | ICD-10-CM

## 2018-03-05 DIAGNOSIS — F411 Generalized anxiety disorder: Secondary | ICD-10-CM | POA: Diagnosis not present

## 2018-03-05 MED ORDER — DIVALPROEX SODIUM 250 MG PO DR TAB: 250.0000 mg | DELAYED_RELEASE_TABLET | Freq: Every day | ORAL | 0 refills | Status: DC

## 2018-03-05 MED ORDER — BUPROPION HCL ER (XL) 300 MG PO TB24: 300.0000 mg | ORAL_TABLET | ORAL | 0 refills | Status: DC

## 2018-03-05 MED ORDER — MIRTAZAPINE 30 MG PO TABS: 30.0000 mg | ORAL_TABLET | Freq: Every day | ORAL | 0 refills | Status: DC

## 2018-03-05 NOTE — Progress Notes (Signed)
BH MD/PA/NP OP Progress Note  03/05/2018 12:06 PM [name]  MRN:  [id number]  Chief Complaint: I am diagnosed with diabetes.  I started new medication and I am losing weight.  HPI: Patient came for her follow-up appointment.  She recently diagnosed with diabetes.  She started metformin but she noticed causing diarrhea and nausea.  She lost 7 pounds in 1 week.  She endorsed sometimes she have decreased energy and decreased appetite.  Overall she describes her mood is good.  She denies any irritability, anger, mania, psychosis.  Her depression and anxiety is better on her medication.  She has no tremors shakes or any EPS.  Her energy level is fair.  She denies any paranoia, hallucination or any suicidal thoughts.  Patient denies drinking or using any illegal substances.  She wants to continue Remeron Wellbutrin and Depakote.   Visit Diagnosis:    ICD-10-CM   1. Major depressive disorder, recurrent episode, mild (HCC) F33.0 mirtazapine (REMERON) 30 MG tablet    divalproex (DEPAKOTE) 250 MG DR tablet    buPROPion (WELLBUTRIN XL) 300 MG 24 hr tablet    Past Psychiatric History: Reviewed She has history of psychiatric inpatient treatment at [facility]. She was admitted to get detox from her Xanax. Patient also seeing psychiatrist when she was in [employer] in [place] in [place] and than [facility]. In the past she had tried Paxil,Prozac, vistaril andZoloft. Patient denies any history of suicidal attempt or any psychosis.  Past Medical History:  Past Medical History:  Diagnosis Date  . Adenomatous colon polyp   . Anxiety   . Asthma   . Back pain with radiation    radiopathy   . Carpal tunnel syndrome 10/27/2010  . COPD (chronic obstructive pulmonary disease) ([place])   . Corneal abrasion, left 04/28/2013  . Depression   . GERD (gastroesophageal reflux disease)   . Hx of migraines   . Hyperlipidemia   . Hypertension   . Nicotine addiction   . PANIC  ATTACK 11/05/2007  . PELVIC  PAIN 08/13/2010  . Urinary incontinence     Past Surgical History:  Procedure Laterality Date  . bleeding ulcer repair  1992   Open Ex Lap with perforation of duodenal ulcer with pertitonitis  . CHOLECYSTECTOMY  1999  . HIATAL HERNIA REPAIR  1967  . left ankle surgery  2008  . removal of mild ducts from left breast  1982  . SIMPLE MASTECTOMY Bilateral 06/04/2010  . TOTAL ABDOMINAL HYSTERECTOMY W/ BILATERAL SALPINGOOPHORECTOMY  1989  . TUBAL LIGATION  1975, 1978   x 2    Family Psychiatric History: Reviewed.  Family History:  Family History  Problem Relation Age of Onset  . Heart disease Mother   . Diabetes Mother   . Lupus Mother   . Colon polyps Mother   . Tuberculosis Father   . Depression Sister   . Diabetes Brother   . Colon polyps Maternal Aunt   . Heart disease Maternal Grandmother   . Heart attack Maternal Aunt   . Stomach cancer Sister   . Endometriosis Daughter   . Diabetes Daughter   . Colon cancer Neg Hx   . Esophageal cancer Neg Hx   . Pancreatic cancer Neg Hx   . Rectal cancer Neg Hx     Social History:  Social History   Socioeconomic History  . Marital status: Legally Separated    Spouse name: Not on file  . Number of children: 3  .  Years of education: Not on file  . Highest education level: Not on file  Occupational History  . Occupation: retired  Scientific laboratory technician  . Financial resource strain: Not on file  . Food insecurity:    Worry: Not on file    Inability: Not on file  . Transportation needs:    Medical: Not on file    Non-medical: Not on file  Tobacco Use  . Smoking status: Former Smoker    Packs/day: 0.25    Years: 42.00    Pack years: 10.50    Types: Cigarettes    Last attempt to quit: 07/06/2017    Years since quitting: 0.6  . Smokeless tobacco: Never Used  Substance and Sexual Activity  . Alcohol use: No    Alcohol/week: 0.0 oz  . Drug use: No  . Sexual activity: Never    Birth control/protection:  Post-menopausal  Lifestyle  . Physical activity:    Days per week: Not on file    Minutes per session: Not on file  . Stress: Not on file  Relationships  . Social connections:    Talks on phone: Not on file    Gets together: Not on file    Attends religious service: Not on file    Active member of club or organization: Not on file    Attends meetings of clubs or organizations: Not on file    Relationship status: Not on file  Other Topics Concern  . Not on file  Social History Narrative  . Not on file    Allergies:  Allergies  Allergen Reactions  . Aspirin Other (See Comments)    Dr told pt not to take asa d/t bleeding ulcer in 1985.  [name] Penicillins Nausea Only    Tablets only.  Denies any airway involvement.    . Latex Rash    Relieved with over the counter medications.    Metabolic Disorder Labs: Lab Results  Component Value Date   HGBA1C 6.8 02/24/2018   MPG 126 (H) 01/20/2012   No results found for: PROLACTIN Lab Results  Component Value Date   CHOL 219 (H) 09/04/2016   TRIG 132 09/04/2016   HDL 40 (L) 09/04/2016   CHOLHDL 5.5 (H) 09/04/2016   VLDL 26 09/04/2016   LDLCALC 153 (H) 09/04/2016   LDLCALC 107 (H) 05/17/2015   Lab Results  Component Value Date   TSH 0.56 09/04/2016   TSH 1.793 12/08/2013    Therapeutic Level Labs: No results found for: LITHIUM No results found for: VALPROATE No components found for:  CBMZ  Current Medications: Current Outpatient Medications  Medication Sig Dispense Refill  . albuterol (PROAIR HFA) 108 (90 Base) MCG/ACT inhaler Inhale 2 puffs as directed into the lungs. 1 Inhaler 3  . ALPRAZolam (XANAX) 0.25 MG tablet Take 1 tablet (0.25 mg total) by mouth at bedtime as needed for anxiety. 2 tablet 0  . buPROPion (WELLBUTRIN XL) 300 MG 24 hr tablet Take 1 tablet (300 mg total) by mouth every morning. 90 tablet 0  . cetirizine (ZYRTEC) 10 MG tablet Take 1 tablet (10 mg total) by mouth daily. 30 tablet 11  . cloNIDine  (CATAPRES) 0.3 MG tablet TAKE 1 AND 1/2 TABLETS BY MOUTH AT BEDTIME 135 tablet 0  . clotrimazole (LOTRIMIN) 1 % cream Apply 1 application topically 2 (two) times daily. 30 g 0  . divalproex (DEPAKOTE) 250 MG DR tablet Take 1 tablet (250 mg total) by mouth at bedtime. 90 tablet 0  . fluticasone (  FLONASE) 50 MCG/ACT nasal spray Place 2 sprays into both nostrils daily. 16 g 6  . hydrochlorothiazide (HYDRODIURIL) 12.5 MG tablet Take 1 tablet (12.5 mg total) daily by mouth. 30 tablet 5  . lidocaine (XYLOCAINE) 2 % solution Use as directed 20 mLs as needed in the mouth or throat for mouth pain. 100 mL 0  . linaclotide (LINZESS) 290 MCG CAPS capsule TAKE 1 CAPSULE BY MOUTH  DAILY BEFORE BREAKFAST 90 capsule 2  . metFORMIN (GLUCOPHAGE) 500 MG tablet Take 1 tablet (500 mg total) by mouth 2 (two) times daily with a meal. 180 tablet 3  . mirtazapine (REMERON) 30 MG tablet Take 1 tablet (30 mg total) by mouth at bedtime. 90 tablet 0  . pantoprazole (PROTONIX) 40 MG tablet Take 1 tablet (40 mg total) by mouth daily. 90 tablet 2  . potassium chloride SA (K-DUR,KLOR-CON) 20 MEQ tablet Take 1 tablet (20 mEq total) daily by mouth. 30 tablet 0  . ranitidine (ZANTAC) 150 MG tablet Take 1 tablet (150 mg total) by mouth at bedtime. 90 tablet 2  . topiramate (TOPAMAX) 25 MG tablet Take 1 tablet (25 mg total) by mouth 2 (two) times daily. 60 tablet 5  . valACYclovir (VALTREX) 500 MG tablet Take 1 tablet (500 mg total) by mouth 2 (two) times daily. x5 days as needed for flares 30 tablet 3  . budesonide-formoterol (SYMBICORT) 80-4.5 MCG/ACT inhaler Inhale 2 puffs 2 (two) times daily into the lungs. (Patient not taking: Reported on 03/05/2018) 1 Inhaler 3   No current facility-administered medications for this visit.      Musculoskeletal: Strength & Muscle Tone: within normal limits Gait & Station: normal Patient leans: N/A  Psychiatric Specialty Exam: Review of Systems  Constitutional: Positive for weight loss.   Gastrointestinal: Positive for diarrhea and nausea.    Blood pressure 132/88, pulse 65, height 5' (1.524 m), weight 151 lb (68.5 kg).Body mass index is 29.49 kg/m.  General Appearance: Casual  Eye Contact:  Good  Speech:  Clear and Coherent  Volume:  Normal  Mood:  Euthymic  Affect:  Appropriate  Thought Process:  Goal Directed  Orientation:  Full (Time, Place, and Person)  Thought Content: Logical   Suicidal Thoughts:  No  Homicidal Thoughts:  No  Memory:  Immediate;   Good Recent;   Good Remote;   Good  Judgement:  Good  Insight:  Good  Psychomotor Activity:  Normal  Concentration:  Concentration: Fair and Attention Span: Fair  Recall:  Good  Fund of Knowledge: Good  Language: Good  Akathisia:  No  Handed:  Right  AIMS (if indicated): not done  Assets:  Communication Skills Desire for Improvement Housing  ADL's:  Intact  Cognition: WNL  Sleep:  Good   Screenings: PHQ2-9     Office Visit from 02/24/2018 in [place] Office Visit from 09/11/2017 in [facility] Office Visit from 10/07/2016 in [place] Office Visit from 09/04/2016 in [place] from 08/12/2016 in [place]  PHQ-2 Total Score  2  0  0  0  2  PHQ-9 Total Score  -  -  -  -  6       Assessment and Plan: Major depressive disorder, recurrent.  Generalized anxiety disorder.  I reviewed blood work results.  Her hemoglobin A1c 6.8.  Reassurance given.  However recommended if symptoms continue to persist then she should contact  her primary care physician.  I will continue Wellbutrin XL 300 mg daily, Remeron 30 mg at bedtime and Depakote 250 mg at bedtime.  Discussed medication side effects and benefits.  Recommended to call us back if she has any question, concern if he feels worsening of the symptoms.  Follow-up in 3 months   Kathlee Nations, MD 03/05/2018, 12:06 PM

## 2018-03-08 ENCOUNTER — Telehealth: Payer: Self-pay | Admitting: Family Medicine

## 2018-03-08 NOTE — Telephone Encounter (Signed)
[facility] called and said that they can not accept the orders with the diagnosis stated. The order either needs to say she was diagnosed with Estrogen Deficiency or Osteopenia.

## 2018-03-08 NOTE — Telephone Encounter (Signed)
I already took care of changing this, routing to PCP as an [place]. [name], Nakia Remmers D, [name]

## 2018-03-08 NOTE — Addendum Note (Signed)
Addended by: [name], APRIL D on: 03/08/2018 04:29 PM   Modules accepted: Orders

## 2018-03-09 NOTE — Telephone Encounter (Signed)
Thank you April!

## 2018-04-26 ENCOUNTER — Other Ambulatory Visit: Payer: Self-pay | Admitting: Internal Medicine

## 2018-04-26 ENCOUNTER — Other Ambulatory Visit (HOSPITAL_COMMUNITY): Payer: Self-pay | Admitting: Psychiatry

## 2018-04-26 DIAGNOSIS — F330 Major depressive disorder, recurrent, mild: Secondary | ICD-10-CM

## 2018-05-10 ENCOUNTER — Other Ambulatory Visit: Payer: Self-pay

## 2018-05-13 DIAGNOSIS — H02889 Meibomian gland dysfunction of unspecified eye, unspecified eyelid: Secondary | ICD-10-CM | POA: Diagnosis not present

## 2018-05-13 DIAGNOSIS — H25813 Combined forms of age-related cataract, bilateral: Secondary | ICD-10-CM | POA: Diagnosis not present

## 2018-05-18 ENCOUNTER — Other Ambulatory Visit: Payer: Self-pay

## 2018-05-18 MED ORDER — CLOTRIMAZOLE 1 % EX CREA: 1.0000 "application " | TOPICAL_CREAM | Freq: Two times a day (BID) | CUTANEOUS | 0 refills | Status: DC

## 2018-05-20 DIAGNOSIS — B309 Viral conjunctivitis, unspecified: Secondary | ICD-10-CM | POA: Diagnosis not present

## 2018-05-24 ENCOUNTER — Other Ambulatory Visit: Payer: Self-pay | Admitting: *Deleted

## 2018-05-25 MED ORDER — CLONIDINE HCL 0.3 MG PO TABS: ORAL_TABLET | ORAL | 0 refills | Status: DC

## 2018-05-27 DIAGNOSIS — B309 Viral conjunctivitis, unspecified: Secondary | ICD-10-CM | POA: Diagnosis not present

## 2018-06-03 ENCOUNTER — Other Ambulatory Visit (HOSPITAL_COMMUNITY): Payer: Self-pay | Admitting: Psychiatry

## 2018-06-03 DIAGNOSIS — F330 Major depressive disorder, recurrent, mild: Secondary | ICD-10-CM

## 2018-06-04 ENCOUNTER — Other Ambulatory Visit (HOSPITAL_COMMUNITY): Payer: Self-pay | Admitting: Psychiatry

## 2018-06-04 DIAGNOSIS — F330 Major depressive disorder, recurrent, mild: Secondary | ICD-10-CM

## 2018-06-08 ENCOUNTER — Other Ambulatory Visit (HOSPITAL_COMMUNITY): Payer: Self-pay

## 2018-06-08 DIAGNOSIS — F330 Major depressive disorder, recurrent, mild: Secondary | ICD-10-CM

## 2018-06-08 MED ORDER — BUPROPION HCL ER (XL) 300 MG PO TB24: 300.0000 mg | ORAL_TABLET | ORAL | 0 refills | Status: DC

## 2018-06-08 MED ORDER — DIVALPROEX SODIUM 250 MG PO DR TAB: 250.0000 mg | DELAYED_RELEASE_TABLET | Freq: Every day | ORAL | 0 refills | Status: DC

## 2018-06-08 MED ORDER — MIRTAZAPINE 30 MG PO TABS: 30.0000 mg | ORAL_TABLET | Freq: Every day | ORAL | 0 refills | Status: DC

## 2018-06-11 ENCOUNTER — Ambulatory Visit (HOSPITAL_COMMUNITY): Payer: Self-pay | Admitting: Psychiatry

## 2018-06-17 ENCOUNTER — Ambulatory Visit: Payer: Self-pay | Admitting: Family Medicine

## 2018-06-30 ENCOUNTER — Ambulatory Visit
Admission: RE | Admit: 2018-06-30 | Discharge: 2018-06-30 | Disposition: A | Discharge status: Home / Self Care | Payer: 59 | Source: Ambulatory Visit | Attending: Family Medicine | Admitting: Family Medicine

## 2018-06-30 DIAGNOSIS — E2839 Other primary ovarian failure: Secondary | ICD-10-CM

## 2018-07-14 ENCOUNTER — Encounter: Payer: Self-pay | Admitting: Family Medicine

## 2018-07-14 ENCOUNTER — Ambulatory Visit (INDEPENDENT_AMBULATORY_CARE_PROVIDER_SITE_OTHER): Payer: 59 | Admitting: Family Medicine

## 2018-07-14 ENCOUNTER — Other Ambulatory Visit: Payer: Self-pay

## 2018-07-14 VITALS — BP 138/90 | HR 80 | Temp 98.4°F | Wt 153.0 lb

## 2018-07-14 DIAGNOSIS — Z23 Encounter for immunization: Secondary | ICD-10-CM | POA: Diagnosis not present

## 2018-07-14 DIAGNOSIS — E119 Type 2 diabetes mellitus without complications: Secondary | ICD-10-CM

## 2018-07-14 DIAGNOSIS — N898 Other specified noninflammatory disorders of vagina: Secondary | ICD-10-CM

## 2018-07-14 LAB — POCT WET PREP (WET MOUNT)
CLUE CELLS WET PREP WHIFF POC: NEGATIVE
TRICHOMONAS WET PREP HPF POC: ABSENT

## 2018-07-14 LAB — POCT GLYCOSYLATED HEMOGLOBIN (HGB A1C): HbA1c, POC (controlled diabetic range): 6.5 % (ref 0.0–7.0)

## 2018-07-14 MED ORDER — LOSARTAN POTASSIUM 25 MG PO TABS: 25.0000 mg | ORAL_TABLET | Freq: Every day | ORAL | 2 refills | Status: DC

## 2018-07-14 NOTE — Progress Notes (Signed)
Subjective:    [name] - 67 y.o. female MRN [id number]  Date of birth: 1951-07-18  HPI  [name] is here for follow up of diabetes.  She would like to discuss her pevic pain and vaginal irritation as well.  Type 2 diabetes, well controlled Medication regimen: metformin 500 mg BID, although this does tend to give her some diarrhea.  She would prefer to continue this form of metformin rather than switching to an extended release form that may give less diarrhea. Diet: Has had less of an appetite recently due to some pelvic pain  Pelvic pain and vaginal irritation -Has been occurring for the last 3 months -Patient was concerned that this could be a reactivation of her HSV-2, so she has been taking suppressive Valtrex - Her vulva burns when she wipes, and she feels like she has pain in her vagina and pelvis -No changes in vaginal discharge, no dysuria, frequency, or hesitancy -Is not sexually active    Health Maintenance:  - patient would like flu shot today Health Maintenance Due  Topic Date Due  . FOOT EXAM  05/08/1960  . OPHTHALMOLOGY EXAM  05/08/1960  . TETANUS/TDAP  06/12/2014    -  reports that she quit smoking about a year ago. Her smoking use included cigarettes. She has a 10.50 pack-year smoking history. She has never used smokeless tobacco. - Review of Systems: Per HPI. - Past Medical History: Patient Active Problem List   Diagnosis Date Noted  . Vaginal irritation 07/15/2018  . Type 2 diabetes, diet controlled ([place]) 02/24/2018  . HSV-2 (herpes simplex virus 2) infection 02/29/2016  . Seborrheic keratoses, inflamed 02/29/2016  . Vulvovaginal candidiasis 05/31/2015  . Itching in the vaginal area 05/17/2015  . Allergic rhinitis 04/25/2014  . Skin lesions 12/08/2013  . Major depressive disorder, recurrent episode, severe, without mention of psychotic behavior 09/08/2012  . Healthcare maintenance 06/13/2012  . Vitamin D deficiency 06/13/2012    . Sleep difficulties 06/13/2012  . Hyperlipidemia - Mixed 05/17/2012  . Migraine 01/20/2012  . COPD (chronic obstructive pulmonary disease) ([place]) 03/05/2011  . BACK PAIN WITH RADICULOPATHY 08/17/2008  . Essential hypertension, benign 11/05/2007  . GERD 11/05/2007   - Medications: reviewed and updated   Objective:   Physical Exam BP 138/90   Pulse 80   Temp 98.4 F (36.9 C) (Oral)   Wt 153 lb (69.4 kg)   SpO2 98%   BMI 29.88 kg/m  Gen: NAD, alert, cooperative with exam, well-appearing CV: RRR, good S1/S2, no murmur, no edema Resp: CTABL, no wheezes, non-labored Abd: SNTND, BS present, no guarding or organomegaly GU: Atrophic vulva, no vesicular lesions on vulva, 1 patch of white appearing skin on inside of right labia, vaginal vault appears irritated, with red punctate lesions occasionally visualized, no abnormal discharge.  Cervix was unable to be visualized due to extreme tenderness with speculum exam. Neuro: no gross deficits.  Psych: good insight, alert and oriented, anxious mood and affect        Assessment & Plan:   Vaginal irritation Wet prep negative.  Patient does not have lesions consistent with HSV-2, so patient was told that she could stop Valtrex.  It is possible that her symptoms are due to vaginal atrophy, but the irritation seen in the vaginal canal does not fit this diagnosis completely.  Differential includes lichen sclerosis, especially given patient's age and the white patch noted on her labia.  It is also possible that she has vaginal intraepithelial neoplasia  given her prolonged irritation and negative wet prep.  I am referring to gynecology today.  I will hold off on prescribing hormone replacement at this time, since I am not sure what the etiology of her vaginal irritation is at this point.  I did encourage her to try a vaginal lubricant to see if this helps her irritation.  Type 2 diabetes, diet controlled ([place]) Patient congratulated and reassured on  her good control of her diabetes, with an A1c of 6.5 today.  She elects to continue metformin 500 mg twice daily, even though this medication does give her diarrhea occasionally.  I would like to see her back in 3 months to check her A1c.    Maia Breslow, M.D. 07/15/2018, 1:57 PM PGY-2, [place]

## 2018-07-14 NOTE — Patient Instructions (Addendum)
It was nice seeing you today [name]!  For your diabetes, please continue metformin 500 mg twice daily, but you can decrease this to once daily if you are having a lot of diarrhea.  Your diabetes is very well controlled.  For your pelvic pain and vaginal irritation, it does not appear to be herpes, so you can stop the Valtrex.  Your swab was negative for yeast or bacteria, so this may be either changes from menopause or other skin changes that need further investigation.  I am referring you to gynecology for follow up.  Please try vaginal lubricant, which you can buy at any drug store or [place].   For your hypertension, please slowly decrease your dose of clonidine (go down by 0.5 mg every 3-4 days), and start losartan once daily.  This medicine is very good for people with diabetes.  If you have any questions or concerns, please feel free to call the clinic.   Be well,  Dr. Shan Levans

## 2018-07-15 DIAGNOSIS — N898 Other specified noninflammatory disorders of vagina: Secondary | ICD-10-CM | POA: Insufficient documentation

## 2018-07-15 NOTE — Assessment & Plan Note (Signed)
Patient congratulated and reassured on her good control of her diabetes, with an A1c of 6.5 today.  She elects to continue metformin 500 mg twice daily, even though this medication does give her diarrhea occasionally.  I would like to see her back in 3 months to check her A1c.

## 2018-07-15 NOTE — Assessment & Plan Note (Signed)
Wet prep negative.  Patient does not have lesions consistent with HSV-2, so patient was told that she could stop Valtrex.  It is possible that her symptoms are due to vaginal atrophy, but the irritation seen in the vaginal canal does not fit this diagnosis completely.  Differential includes lichen sclerosis, especially given patient's age and the white patch noted on her labia.  It is also possible that she has vaginal intraepithelial neoplasia given her prolonged irritation and negative wet prep.  I am referring to gynecology today.  I will hold off on prescribing hormone replacement at this time, since I am not sure what the etiology of her vaginal irritation is at this point.  I did encourage her to try a vaginal lubricant to see if this helps her irritation.

## 2018-07-27 ENCOUNTER — Ambulatory Visit: Payer: Self-pay | Admitting: Family Medicine

## 2018-08-03 ENCOUNTER — Ambulatory Visit (HOSPITAL_COMMUNITY): Payer: Self-pay | Admitting: Psychiatry

## 2018-08-19 ENCOUNTER — Ambulatory Visit: Payer: Self-pay | Admitting: Obstetrics & Gynecology

## 2018-08-22 ENCOUNTER — Other Ambulatory Visit (HOSPITAL_COMMUNITY): Payer: Self-pay | Admitting: Psychiatry

## 2018-08-22 DIAGNOSIS — F330 Major depressive disorder, recurrent, mild: Secondary | ICD-10-CM

## 2018-08-25 ENCOUNTER — Other Ambulatory Visit: Payer: Self-pay | Admitting: Internal Medicine

## 2018-08-25 ENCOUNTER — Ambulatory Visit (HOSPITAL_COMMUNITY): Payer: 59 | Admitting: Psychiatry

## 2018-08-25 ENCOUNTER — Other Ambulatory Visit: Payer: Self-pay | Admitting: Family Medicine

## 2018-09-07 ENCOUNTER — Encounter: Payer: Self-pay | Admitting: Family Medicine

## 2018-09-07 ENCOUNTER — Ambulatory Visit: Payer: Self-pay | Admitting: Obstetrics & Gynecology

## 2018-09-07 ENCOUNTER — Ambulatory Visit (INDEPENDENT_AMBULATORY_CARE_PROVIDER_SITE_OTHER): Payer: 59 | Admitting: Family Medicine

## 2018-09-07 ENCOUNTER — Other Ambulatory Visit: Payer: Self-pay

## 2018-09-07 VITALS — BP 134/82 | HR 71 | Temp 98.1°F | Wt 156.4 lb

## 2018-09-07 DIAGNOSIS — R42 Dizziness and giddiness: Secondary | ICD-10-CM | POA: Insufficient documentation

## 2018-09-07 DIAGNOSIS — R05 Cough: Secondary | ICD-10-CM | POA: Diagnosis not present

## 2018-09-07 DIAGNOSIS — N3946 Mixed incontinence: Secondary | ICD-10-CM | POA: Diagnosis not present

## 2018-09-07 DIAGNOSIS — R059 Cough, unspecified: Secondary | ICD-10-CM

## 2018-09-07 HISTORY — DX: Dizziness and giddiness: R42

## 2018-09-07 NOTE — Assessment & Plan Note (Signed)
Cough could be due to postinfectious bronchial irritation, but given her smoking history, there is a concern that she could have COPD.  A nighttime cough is often seen with asthma as well.  Her PFT from 2015 that was normal, but I would like for her to have this rechecked.  If it is positive, we can start a medication for COPD and/or asthma.  She is agreeable to this plan and will make an appointment to get a PFT done.

## 2018-09-07 NOTE — Assessment & Plan Note (Signed)
Discussed with patient that there are multiple options for treatment of this condition, including timed voiding, Kegel exercises, pelvic floor rehab, and medication.  She would like to avoid more medications at this time, so we will refer her to pelvic floor rehab.  I also discussed and gave her a handout on Kegel exercises and timed voiding.

## 2018-09-07 NOTE — Patient Instructions (Addendum)
It was nice seeing you today [name]!  For your bladder issues, we are going to try 3 things: Kegle exercises, timed voiding, and a referral to pelvic floor rehab.  These 3 things will be very helpful in spacing out when you urinate and keeping you from leaking in between.  For your balance issues, please send in the paperwork about your hearing exam.  We will use that to see if you need further work-up from that and.  Please also continue to stay well-hydrated with water and slowly rise from a seated position to standing.  For your cough, I would like for you to make an appointment to get pulmonary function testing done.  You have gotten this done in the past and it was normal, but I would like to see if that has changed.  I have concerned that you could have COPD or asthma, which we can treat.  You may also have a postinfectious cough, which can often linger for many weeks.  Try a multivitamin with calcium and Vitamin D (if you would like).  Continue to walk every day. If you have any questions or concerns, please feel free to call the clinic.   Be well,  Dr. Shan Levans  Kegel Exercises Kegel exercises help strengthen the muscles that support the rectum, vagina, small intestine, bladder, and uterus. Doing Kegel exercises can help:  Improve bladder and bowel control.  Improve sexual response.  Reduce problems and discomfort during pregnancy.  Kegel exercises involve squeezing your pelvic floor muscles, which are the same muscles you squeeze when you try to stop the flow of urine. The exercises can be done while sitting, standing, or lying down, but it is best to vary your position. Phase 1 exercises 1. Squeeze your pelvic floor muscles tight. You should feel a tight lift in your rectal area. If you are a female, you should also feel a tightness in your vaginal area. Keep your stomach, buttocks, and legs relaxed. 2. Hold the muscles tight for up to 10 seconds. 3. Relax your  muscles. Repeat this exercise 50 times a day or as many times as told by your health care provider. Continue to do this exercise for at least 4-6 weeks or for as long as told by your health care provider. This information is not intended to replace advice given to you by your health care provider. Make sure you discuss any questions you have with your health care provider. Document Released: 09/29/2012 Document Revised: 06/07/2016 Document Reviewed: 09/02/2015 Elsevier Interactive Patient Education  [employer].

## 2018-09-07 NOTE — Progress Notes (Signed)
Subjective:    [name] - 67 y.o. female MRN [id number]  Date of birth: 1950/11/26  CC:  [name] is here to discuss her incontinence, balance issues, and cough.  HPI:  Incontinence - has leaking after urination - urinates very frequently, about six times during the night - duration of eight months - some leakage when sneezing - has some urgency - has reduced caffeine intake, drinks coffee twice weekly - no dysuria  Balance issues - head "swims" and spins when she gets up in the morning - cough makes spinning feeling worse - balance issues were going on before the cough - has fallen twice in the last few months due to balance issues - standing up from seated position exacerbates dizziness  Cough - has had a cough for the last two months with epistaxis when she blows her nose - has been COPD and asthma in the past, but she does not think that this is accurate - reports that her apartment has a lot of dust, which she thinks could be exacerbating the issue - has used air purifiers and humidifiers with minimal relief   Health Maintenance:  Health Maintenance Due  Topic Date Due  . FOOT EXAM  05/08/1960  . OPHTHALMOLOGY EXAM  05/08/1960  . TETANUS/TDAP  06/12/2014    -  reports that she quit smoking about 14 months ago. Her smoking use included cigarettes. She has a 10.50 pack-year smoking history. She has never used smokeless tobacco. - Review of Systems: Per HPI. - Past Medical History: Patient Active Problem List   Diagnosis Date Noted  . Mixed urge and stress incontinence 09/07/2018  . Dizziness 09/07/2018  . Vaginal irritation 07/15/2018  . Type 2 diabetes, diet controlled ([place]) 02/24/2018  . HSV-2 (herpes simplex virus 2) infection 02/29/2016  . Seborrheic keratoses, inflamed 02/29/2016  . Vulvovaginal candidiasis 05/31/2015  . Itching in the vaginal area 05/17/2015  . Cough 10/02/2014  . Allergic rhinitis 04/25/2014  . Skin lesions  12/08/2013  . Major depressive disorder, recurrent episode, severe, without mention of psychotic behavior 09/08/2012  . Healthcare maintenance 06/13/2012  . Vitamin D deficiency 06/13/2012  . Sleep difficulties 06/13/2012  . Hyperlipidemia - Mixed 05/17/2012  . Migraine 01/20/2012  . COPD (chronic obstructive pulmonary disease) ([place]) 03/05/2011  . BACK PAIN WITH RADICULOPATHY 08/17/2008  . Essential hypertension, benign 11/05/2007  . GERD 11/05/2007   - Medications: reviewed and updated   Objective:   Physical Exam BP 134/82   Pulse 71   Temp 98.1 F (36.7 C) (Oral)   Wt 156 lb 6.4 oz (70.9 kg)   SpO2 98%   BMI 30.54 kg/m  Gen: NAD, alert, cooperative with exam, well-appearing, pleasant CV: RRR, good S1/S2, no murmur, no edema Resp: CTABL, no wheezes, non-labored Abd: SNTND, BS present, no guarding or organomegaly Neuro: no gross deficits.    Orthostatic vital signs: supine 130/82, 66, sitting 150/88, 68, standing at 0 minutes 152/88, 75, standing at 3 minutes 112/78, 71  Assessment & Plan:   Mixed urge and stress incontinence Discussed with patient that there are multiple options for treatment of this condition, including timed voiding, Kegel exercises, pelvic floor rehab, and medication.  She would like to avoid more medications at this time, so we will refer her to pelvic floor rehab.  I also discussed and gave her a handout on Kegel exercises and timed voiding.  Cough Cough could be due to postinfectious bronchial irritation, but given her smoking history, there  is a concern that she could have COPD.  A nighttime cough is often seen with asthma as well.  Her PFT from 2015 that was normal, but I would like for her to have this rechecked.  If it is positive, we can start a medication for COPD and/or asthma.  She is agreeable to this plan and will make an appointment to get a PFT done.  Dizziness Likely combination of orthostatic hypotension and possible BPPV or Mnire's  disease.  Orthostatic vitals are negative.  We will need to investigate this more at a future appointment.  I told the patient to send in her audiologist report, since she has this at her house.  If she does not fact have hearing difficulties on her left side, this could be contributing to her balance issues.  I also recommended that she stay hydrated, since dehydration can often be a culprit for dizziness in the elderly.    Maia Breslow, M.D. 09/08/2018, 7:54 PM PGY-2, [place]

## 2018-09-07 NOTE — Assessment & Plan Note (Addendum)
Likely combination of orthostatic hypotension and possible BPPV or Mnire's disease.  Orthostatic vitals are negative.  We will need to investigate this more at a future appointment.  I told the patient to send in her audiologist report, since she has this at her house.  If she does not fact have hearing difficulties on her left side, this could be contributing to her balance issues.  I also recommended that she stay hydrated, since dehydration can often be a culprit for dizziness in the elderly.

## 2018-09-21 ENCOUNTER — Other Ambulatory Visit: Payer: Self-pay

## 2018-09-21 MED ORDER — TOPIRAMATE 25 MG PO TABS: 25.0000 mg | ORAL_TABLET | Freq: Two times a day (BID) | ORAL | 5 refills | Status: DC

## 2018-09-28 ENCOUNTER — Encounter (HOSPITAL_COMMUNITY): Payer: Self-pay | Admitting: Psychiatry

## 2018-09-28 ENCOUNTER — Ambulatory Visit (INDEPENDENT_AMBULATORY_CARE_PROVIDER_SITE_OTHER): Payer: 59 | Admitting: Psychiatry

## 2018-09-28 VITALS — BP 142/84 | Ht 61.0 in | Wt 153.0 lb

## 2018-09-28 DIAGNOSIS — F330 Major depressive disorder, recurrent, mild: Secondary | ICD-10-CM | POA: Diagnosis not present

## 2018-09-28 DIAGNOSIS — F411 Generalized anxiety disorder: Secondary | ICD-10-CM | POA: Diagnosis not present

## 2018-09-28 MED ORDER — MIRTAZAPINE 30 MG PO TABS: ORAL_TABLET | ORAL | 0 refills | Status: DC

## 2018-09-28 MED ORDER — DIVALPROEX SODIUM 500 MG PO DR TAB: 500.0000 mg | DELAYED_RELEASE_TABLET | Freq: Every day | ORAL | 0 refills | Status: DC

## 2018-09-28 MED ORDER — BUPROPION HCL ER (XL) 300 MG PO TB24: 300.0000 mg | ORAL_TABLET | ORAL | 0 refills | Status: DC

## 2018-09-28 NOTE — Progress Notes (Signed)
BH MD/PA/NP OP Progress Note  09/28/2018 2:32 PM [name]  MRN:  [id number]  Chief Complaint: Some nights I do not sleep well.  I still have mood irritability.  HPI: Patient came for her follow-up appointment.  She is taking her medication but some nights she still have difficulty and having racing thoughts.  She still have residual mood lability and depression.  She had a good Thanksgiving with her daughter.  Her depression is stable.  She denies any panic attack or any severe anxiety.  Her energy level is okay.  She denies any suicidal thoughts or homicidal thought.  She denies any paranoia or any hallucination.  Patient denies drinking or using any illegal substances.  She has no tremors, shakes or any EPS.  Recently seen by primary care physician and had blood work.  Her hemoglobin A1c is little better from the past.  Her weight is unchanged.    Visit Diagnosis:    ICD-10-CM   1. GAD (generalized anxiety disorder) F41.1   2. Major depressive disorder, recurrent episode, mild (HCC) F33.0 mirtazapine (REMERON) 30 MG tablet    divalproex (DEPAKOTE) 500 MG DR tablet    buPROPion (WELLBUTRIN XL) 300 MG 24 hr tablet    Past Psychiatric History: Reviewed. Hstory of psychiatric inpatient treatment at [facility] to detox from Xanax. History seeing psychiatrist in [place], [place] and than [facility]. Tried Paxil,Prozac, vistaril andZoloft. No history of suicidal attempt or any psychosis.  Past Medical History:  Past Medical History:  Diagnosis Date  . Adenomatous colon polyp   . Anxiety   . Asthma   . Back pain with radiation    radiopathy   . Carpal tunnel syndrome 10/27/2010  . COPD (chronic obstructive pulmonary disease) ([place])   . Corneal abrasion, left 04/28/2013  . Depression   . GERD (gastroesophageal reflux disease)   . Hx of migraines   . Hyperlipidemia   . Hypertension   . Nicotine addiction   . PANIC ATTACK 11/05/2007  . PELVIC   PAIN 08/13/2010  . Urinary incontinence     Past Surgical History:  Procedure Laterality Date  . bleeding ulcer repair  1992   Open Ex Lap with perforation of duodenal ulcer with pertitonitis  . CHOLECYSTECTOMY  1999  . HIATAL HERNIA REPAIR  1967  . left ankle surgery  2008  . removal of mild ducts from left breast  1982  . SIMPLE MASTECTOMY Bilateral 06/04/2010  . TOTAL ABDOMINAL HYSTERECTOMY W/ BILATERAL SALPINGOOPHORECTOMY  1989  . TUBAL LIGATION  1975, 1978   x 2    Family Psychiatric History: Reviewed.  Family History:  Family History  Problem Relation Age of Onset  . Heart disease Mother   . Diabetes Mother   . Lupus Mother   . Colon polyps Mother   . Tuberculosis Father   . Depression Sister   . Diabetes Brother   . Colon polyps Maternal Aunt   . Heart disease Maternal Grandmother   . Heart attack Maternal Aunt   . Stomach cancer Sister   . Endometriosis Daughter   . Diabetes Daughter   . Colon cancer Neg Hx   . Esophageal cancer Neg Hx   . Pancreatic cancer Neg Hx   . Rectal cancer Neg Hx     Social History:  Social History   Socioeconomic History  . Marital status: Legally Separated    Spouse name: Not on file  . Number of children: 3  .  Years of education: Not on file  . Highest education level: Not on file  Occupational History  . Occupation: retired  Scientific laboratory technician  . Financial resource strain: Not on file  . Food insecurity:    Worry: Not on file    Inability: Not on file  . Transportation needs:    Medical: Not on file    Non-medical: Not on file  Tobacco Use  . Smoking status: Former Smoker    Packs/day: 0.25    Years: 42.00    Pack years: 10.50    Types: Cigarettes    Last attempt to quit: 07/06/2017    Years since quitting: 1.2  . Smokeless tobacco: Never Used  Substance and Sexual Activity  . Alcohol use: No    Alcohol/week: 0.0 standard drinks  . Drug use: No  . Sexual activity: Never    Birth control/protection:  Post-menopausal  Lifestyle  . Physical activity:    Days per week: Not on file    Minutes per session: Not on file  . Stress: Not on file  Relationships  . Social connections:    Talks on phone: Not on file    Gets together: Not on file    Attends religious service: Not on file    Active member of club or organization: Not on file    Attends meetings of clubs or organizations: Not on file    Relationship status: Not on file  Other Topics Concern  . Not on file  Social History Narrative  . Not on file    Allergies:  Allergies  Allergen Reactions  . Aspirin Other (See Comments)    Dr told pt not to take asa d/t bleeding ulcer in 1985.  [name] Penicillins Nausea Only    Tablets only.  Denies any airway involvement.    . Latex Rash    Relieved with over the counter medications.    Metabolic Disorder Labs: Lab Results  Component Value Date   HGBA1C 6.5 07/14/2018   MPG 126 (H) 01/20/2012   No results found for: PROLACTIN Lab Results  Component Value Date   CHOL 219 (H) 09/04/2016   TRIG 132 09/04/2016   HDL 40 (L) 09/04/2016   CHOLHDL 5.5 (H) 09/04/2016   VLDL 26 09/04/2016   LDLCALC 153 (H) 09/04/2016   LDLCALC 107 (H) 05/17/2015   Lab Results  Component Value Date   TSH 0.56 09/04/2016   TSH 1.793 12/08/2013    Therapeutic Level Labs: No results found for: LITHIUM No results found for: VALPROATE No components found for:  CBMZ  Current Medications: Current Outpatient Medications  Medication Sig Dispense Refill  . albuterol (PROAIR HFA) 108 (90 Base) MCG/ACT inhaler Inhale 2 puffs as directed into the lungs. 1 Inhaler 3  . ALPRAZolam (XANAX) 0.25 MG tablet Take 1 tablet (0.25 mg total) by mouth at bedtime as needed for anxiety. 2 tablet 0  . buPROPion (WELLBUTRIN XL) 300 MG 24 hr tablet Take 1 tablet (300 mg total) by mouth every morning. 90 tablet 0  . cetirizine (ZYRTEC) 10 MG tablet Take 1 tablet (10 mg total) by mouth daily. 30 tablet 11  . clotrimazole  (LOTRIMIN) 1 % cream Apply 1 application topically 2 (two) times daily. 30 g 0  . divalproex (DEPAKOTE) 250 MG DR tablet Take 1 tablet (250 mg total) by mouth at bedtime. 90 tablet 0  . fluticasone (FLONASE) 50 MCG/ACT nasal spray Place 2 sprays into both nostrils daily. 16 g 6  . hydrochlorothiazide (  HYDRODIURIL) 12.5 MG tablet Take 1 tablet (12.5 mg total) daily by mouth. 30 tablet 5  . lidocaine (XYLOCAINE) 2 % solution Use as directed 20 mLs as needed in the mouth or throat for mouth pain. 100 mL 0  . LINZESS 290 MCG CAPS capsule TAKE 1 CAPSULE BY MOUTH DAILY BEFORE BREAKFAST 90 capsule 0  . losartan (COZAAR) 25 MG tablet Take 1 tablet (25 mg total) by mouth daily. 30 tablet 2  . metFORMIN (GLUCOPHAGE) 500 MG tablet Take 1 tablet (500 mg total) by mouth 2 (two) times daily with a meal. 180 tablet 3  . mirtazapine (REMERON) 30 MG tablet TAKE 1 TABLET(30 MG) BY MOUTH AT BEDTIME 90 tablet 0  . pantoprazole (PROTONIX) 40 MG tablet Take 1 tablet (40 mg total) by mouth daily. 90 tablet 2  . potassium chloride SA (K-DUR,KLOR-CON) 20 MEQ tablet Take 1 tablet (20 mEq total) daily by mouth. 30 tablet 0  . ranitidine (ZANTAC) 150 MG tablet Take 1 tablet (150 mg total) by mouth at bedtime. 90 tablet 2  . topiramate (TOPAMAX) 25 MG tablet Take 1 tablet (25 mg total) by mouth 2 (two) times daily. 60 tablet 5  . valACYclovir (VALTREX) 500 MG tablet Take 1 tablet (500 mg total) by mouth 2 (two) times daily. x5 days as needed for flares 30 tablet 3   No current facility-administered medications for this visit.      Musculoskeletal: Strength & Muscle Tone: within normal limits Gait & Station: normal Patient leans: N/A  Psychiatric Specialty Exam: ROS  Blood pressure (!) 142/84, height 5\' 1"  (1.549 m), weight 153 lb (69.4 kg).Body mass index is 28.91 kg/m.  General Appearance: Casual  Eye Contact:  Good  Speech:  Clear and Coherent  Volume:  Normal  Mood:  Anxious  Affect:  Congruent  Thought  Process:  Goal Directed  Orientation:  Full (Time, Place, and Person)  Thought Content: Logical   Suicidal Thoughts:  No  Homicidal Thoughts:  No  Memory:  Immediate;   Good Recent;   Good Remote;   Good  Judgement:  Good  Insight:  Present  Psychomotor Activity:  Normal  Concentration:  Concentration: Good and Attention Span: Good  Recall:  Good  Fund of Knowledge: Good  Language: Good  Akathisia:  No  Handed:  Right  AIMS (if indicated): not done  Assets:  Communication Skills Desire for Improvement Housing Resilience  ADL's:  Intact  Cognition: WNL  Sleep:  Fair   Screenings: PHQ2-9     Office Visit from 09/07/2018 in [place] Office Visit from 07/14/2018 in [place] Office Visit from 02/24/2018 in [place] Office Visit from 09/11/2017 in [place] Office Visit from 10/07/2016 in [place]  PHQ-2 Total Score  0  2  2  0  0       Assessment and Plan: Major depressive disorder, recurrent.  Generalized anxiety disorder.  Review records from other provider including blood work results.  Patient has residual mood lability and insomnia.  Recommended to try Depakote 500 mg at bedtime.  Discussed medication side effects and encourage healthy lifestyle and watch her calorie intake.  Continue Wellbutrin XL 300 mg daily and Remeron 30 mg at bedtime.  Patient is not interested in therapy.  Recommended to call us back if he has any question or any concern.  Follow-up in 3 months.    Arlyce Harman  Arfeen, MD 09/28/2018, 2:32 PM

## 2018-09-30 ENCOUNTER — Ambulatory Visit: Payer: 59 | Admitting: Pharmacist

## 2018-09-30 ENCOUNTER — Telehealth: Payer: Self-pay

## 2018-09-30 NOTE — Telephone Encounter (Signed)
Fax refill request for Clonidine. Not on current med list.  Danley Danker, RN [facility] RN)

## 2018-10-01 NOTE — Telephone Encounter (Signed)
We should not refill her clonidine - I stopped it earlier this year.  Thanks.

## 2018-10-04 ENCOUNTER — Telehealth: Payer: Self-pay | Admitting: *Deleted

## 2018-10-04 NOTE — Telephone Encounter (Signed)
Pt calls to ask why she cant have clonidine anymore.  After a lengthy discussion figured out that pt never picked up the losartan and was unaware that she was supposed to pick it up.  She also cant remember the last time she had HCTZ.  So esentially she has been without any BP meds for 2 weeks.  She wonders what she will take for sleep if she is not on the clonidine.  Pt states clonidine is the only thing that helps her and states several times that she is not addicted.  Also stated once that she only takes when she needs to sleep but recants that when questioned about it.  Pt denies CP, SOB, blurred vision.  Her BP now (has home machine) is 142/84.  Will forward to MD to advised. Daevon Holdren, Salome Spotted, CMA

## 2018-10-07 ENCOUNTER — Other Ambulatory Visit: Payer: Self-pay | Admitting: Family Medicine

## 2018-10-07 MED ORDER — CLONIDINE HCL 0.1 MG PO TABS: 0.3000 mg | ORAL_TABLET | Freq: Every day | ORAL | 11 refills | Status: DC

## 2018-10-07 NOTE — Telephone Encounter (Signed)
Since she finds that clonidine is helpful for her sleep, I will send that in today.  She should check to see if losartan and HCTZ are available at her pharmacy and if they are no longer available, I will send them in.  Thanks!

## 2018-10-07 NOTE — Telephone Encounter (Signed)
PCLM asking for a return call to our office. If pt calls, please give her the information below. Ottis Stain, CMA

## 2018-10-13 ENCOUNTER — Telehealth: Payer: Self-pay

## 2018-10-13 NOTE — Telephone Encounter (Signed)
Call from [facility]. Clonidine has conflicting directions on the sig line. Please clarify.  Call back is [phone]  Danley Danker, RN [facility] [place])

## 2018-10-14 ENCOUNTER — Other Ambulatory Visit: Payer: Self-pay | Admitting: Family Medicine

## 2018-10-14 MED ORDER — CLONIDINE HCL 0.1 MG PO TABS: 0.1000 mg | ORAL_TABLET | Freq: Every day | ORAL | 11 refills | Status: DC

## 2018-10-14 NOTE — Telephone Encounter (Signed)
I've corrected the sig for patient's clonidine.  Thanks for your help!

## 2018-10-15 NOTE — Telephone Encounter (Signed)
PCLM asking pt to call our office. Ottis Stain, CMA

## 2018-11-01 ENCOUNTER — Ambulatory Visit: Payer: 59 | Admitting: Pharmacist

## 2018-11-05 ENCOUNTER — Ambulatory Visit (INDEPENDENT_AMBULATORY_CARE_PROVIDER_SITE_OTHER): Payer: 59 | Admitting: Family Medicine

## 2018-11-05 ENCOUNTER — Other Ambulatory Visit: Payer: Self-pay

## 2018-11-05 ENCOUNTER — Encounter: Payer: Self-pay | Admitting: Family Medicine

## 2018-11-05 VITALS — BP 142/89 | HR 84 | Temp 98.6°F | Ht 61.0 in | Wt 152.2 lb

## 2018-11-05 DIAGNOSIS — M7918 Myalgia, other site: Secondary | ICD-10-CM

## 2018-11-05 NOTE — Patient Instructions (Signed)
It was wonderful to see you today.  Thank you for choosing [facility].   Please call [phone] with any questions about today's appointment.  Please be sure to schedule follow up at the front  desk before you leave today.   Yarissa Reining, MD  Family Medicine    

## 2018-11-05 NOTE — Progress Notes (Signed)
  Patient Name: [name] Date of Birth: 11/20/1950 Date of Visit: 11/05/18 PCP: Kathrene Alu, MD  Chief Complaint:  Pain in abdomen   Subjective: [name] is a pleasant 68 y.o. year old woman with history of gastric ulcer resulting in perforation, cholecystectomy and hypertension presenting today for evaluation of 2 days of abdominal pain last week.  The patient reports after the new year she had 2 days of moderate abdominal pain.  The pain was crampy and constant.  The pain waxed and waned throughout those 2 days.  She was able to eat and drink.  No nausea or vomiting associated with this.  She did have constipation associated with this.  She reports the pain is worse when she pressed on her epigastric area.  She denies melena or hematochezia. She is having normal stools at this time.  She is here today because she is worried she is had recurrence of her gastric ulcer.  She is not taking any aspirin or NSAID products.  She is feeling well today and reports she is back to her usual self.   ROS:  ROS As above I have reviewed the patient's medical, surgical, family, and social history as appropriate.   Vitals:   11/05/18 1059  BP: (!) 142/89  Pulse: 84  Temp: 98.6 F (37 C)  SpO2: 99%   Filed Weights   11/05/18 1059  Weight: 152 lb 3.2 oz (69 kg)  Weight is stable  HEENT: Sclera anicteric. Dentition is moderate. Appears well hydrated. Neck: Supple Cardiac: Regular rate and rhythm. Normal S1/S2. No murmurs, rubs, or gallops appreciated. Lungs: Clear bilaterally to ascultation.  Abdomen: Normoactive bowel sounds.  Minimal midline epigastric tenderness over her prior incision.  This is some when she raises her legs up or flexes her abdominal muscles.  No right upper quadrant pain no rebound or guarding Extremities: Warm, well perfused without edema.  Skin: Warm, dry Psych: Pleasant and appropriate   [name] was seen today for sore stomach.  Diagnoses  and all orders for this visit:  Abdominal wall pain, no signs or symptoms suggestive of perforated ulcer.  She is not taking any medications which could precipitate this.  Her pain is largely resolved and improved.  No alcohol use.  No tobacco use.  Recommended that she use Tylenol as needed over the next 1 to 2 days and heat if the pain return.  If the pain comes back and is worse or continues she is to call her clinic for further evaluation.  I also suspect she may have a component of a small ventral hernia, this is previously documented as well   Dorris Singh, MD  [facility] Medicine Teaching Service

## 2018-12-01 ENCOUNTER — Other Ambulatory Visit: Payer: Self-pay | Admitting: Family Medicine

## 2018-12-01 DIAGNOSIS — B379 Candidiasis, unspecified: Secondary | ICD-10-CM

## 2018-12-15 ENCOUNTER — Other Ambulatory Visit: Payer: Self-pay

## 2018-12-15 ENCOUNTER — Encounter: Payer: Self-pay | Admitting: Family Medicine

## 2018-12-15 ENCOUNTER — Ambulatory Visit (INDEPENDENT_AMBULATORY_CARE_PROVIDER_SITE_OTHER): Payer: 59 | Admitting: Family Medicine

## 2018-12-15 ENCOUNTER — Telehealth: Payer: Self-pay | Admitting: Family Medicine

## 2018-12-15 VITALS — BP 170/100 | HR 77 | Temp 98.3°F | Wt 153.0 lb

## 2018-12-15 DIAGNOSIS — G5603 Carpal tunnel syndrome, bilateral upper limbs: Secondary | ICD-10-CM | POA: Diagnosis not present

## 2018-12-15 DIAGNOSIS — G43109 Migraine with aura, not intractable, without status migrainosus: Secondary | ICD-10-CM

## 2018-12-15 DIAGNOSIS — E119 Type 2 diabetes mellitus without complications: Secondary | ICD-10-CM

## 2018-12-15 DIAGNOSIS — I10 Essential (primary) hypertension: Secondary | ICD-10-CM | POA: Diagnosis not present

## 2018-12-15 LAB — POCT GLYCOSYLATED HEMOGLOBIN (HGB A1C): Hemoglobin A1C: 5.9 % — AB (ref 4.0–5.6)

## 2018-12-15 MED ORDER — PROPRANOLOL HCL 40 MG PO TABS: 40.0000 mg | ORAL_TABLET | Freq: Two times a day (BID) | ORAL | 11 refills | Status: DC

## 2018-12-15 NOTE — Assessment & Plan Note (Addendum)
Blood pressure was significantly elevated today at 170/100 and then 190/100 on repeat measurement.  Although patient says that she is taking losartan, HCTZ, and clonidine, I wonder if she is not taking these medications regularly, especially since she says that she is prevented from picking up medications frequently due to cost.  Unfortunately, she says she cannot pick up any other medications for the next few weeks until she gets her paycheck.  Encouraged patient to continue taking her blood pressure medications and to never stop clonidine cold [place].  I would like to see patient back in 1 week to reassess, even if patient can just come in to nurse clinic for a BP check.

## 2018-12-15 NOTE — Assessment & Plan Note (Signed)
Provided patient with 1 arm brace for our clinic so that she can use her other one on the other hand.  Encourage patient to wear them every night and that this is the first intervention for carpal tunnel.  Advised patient that her symptoms will hopefully improve slowly over the next 6 to 8 weeks.

## 2018-12-15 NOTE — Telephone Encounter (Signed)
Attempted to call patient twice and patient's daughter regarding the plan for her elevated blood pressure, but neither numbers were working.

## 2018-12-15 NOTE — Progress Notes (Signed)
Subjective:    [name] - 68 y.o. female MRN [id number]  Date of birth: [birth year]  CC:  [name] is here for diabetes follow-up.  She also would like to discuss her headaches and bilateral hand pain and numbness.  HPI: Type 2 diabetes Medication: metformin 500 mg once daily Hypoglycemia symptoms: none Diet and exercise: has avoided pork, bread, is eating more fruit and vegetables, trying to reduce sweets, is walking almost every day Blood sugars: Does not check  Headache - located on left frontal area - occurs later in the day - quality is described as "throbbing" - endorses photophobia, phonophobia, vision changes - denies nausea - used to receive a shot in the emergency department for this, but this was before she moved to [place], and she does not know the name of this medication - continues to take topomax for migraines, but does not think it is helpful - says that tylenol is not helpful -Although she has been unable to refill some of her medications, she continues to take clonidine, metformin, losartan, HCTZ  Bilateral hand pain and numbness -Worse when she wakes up in the morning -We will have tingling in all of her fingers -Has had no swelling or redness in the joints of her hands -Has bought a wrist brace, but can only afford 1, so she has been switching it back and forth and wearing it most nights  Health Maintenance:  Health Maintenance Due  Topic Date Due  . FOOT EXAM  05/08/1960  . OPHTHALMOLOGY EXAM  05/08/1960  . TETANUS/TDAP  06/12/2014    -  reports that she quit smoking about 17 months ago. Her smoking use included cigarettes. She has a 10.50 pack-year smoking history. She has never used smokeless tobacco. - Review of Systems: Per HPI. - Past Medical History: Patient Active Problem List   Diagnosis Date Noted  . Mixed urge and stress incontinence 09/07/2018  . Dizziness 09/07/2018  . Vaginal irritation 07/15/2018  . Type 2  diabetes, diet controlled ([place]) 02/24/2018  . HSV-2 (herpes simplex virus 2) infection 02/29/2016  . Seborrheic keratoses, inflamed 02/29/2016  . Vulvovaginal candidiasis 05/31/2015  . Itching in the vaginal area 05/17/2015  . Cough 10/02/2014  . Allergic rhinitis 04/25/2014  . Skin lesions 12/08/2013  . Major depressive disorder, recurrent episode, severe, without mention of psychotic behavior 09/08/2012  . Healthcare maintenance 06/13/2012  . Vitamin D deficiency 06/13/2012  . Sleep difficulties 06/13/2012  . Hyperlipidemia - Mixed 05/17/2012  . Migraine 01/20/2012  . COPD (chronic obstructive pulmonary disease) ([place]) 03/05/2011  . Bilateral carpal tunnel syndrome 10/27/2010  . BACK PAIN WITH RADICULOPATHY 08/17/2008  . Essential hypertension, benign 11/05/2007  . GERD 11/05/2007   - Medications: reviewed and updated   Objective:   Physical Exam BP (!) 170/100   Pulse 77   Temp 98.3 F (36.8 C) (Oral)   Wt 153 lb (69.4 kg)   SpO2 98%   BMI 28.91 kg/m  Gen: NAD, alert, cooperative with exam, well-appearing HEENT: NCAT, PERRL, clear conjunctiva, supple neck CV: RRR, good S1/S2, no murmur, no edema Resp: CTABL, no wheezes, non-labored Skin: no rashes, normal turgor Musculoskeletal: Nontender to palpation of bilateral PIP, DIP and MCP joints Neuro: no gross deficits, 5/5 strength in upper and lower extremities, cranial nerves II-XII normal, Phalen sign positive    Assessment & Plan:   Type 2 diabetes, diet controlled (HCC) Hemoglobin A1c was 5.9 today.  Congratulated patient and encouraged her to continue taking  metformin 500 mg once per day.  Also congratulated her on her healthy diet and exercise.  Migraine Headaches seem like migraine headaches.  Since Topamax is not helpful for the patient, will stop this medication.  Patient is getting these frequent enough to warrant prophylactic medications, so will start propranolol 40 mg twice daily.  We can titrate up from there.   This may also help with her blood pressure.  Essential hypertension, benign Blood pressure was significantly elevated today at 170/100 and then 190/100 on repeat measurement.  Although patient says that she is taking losartan, HCTZ, and clonidine, I wonder if she is not taking these medications regularly, especially since she says that she is prevented from picking up medications frequently due to cost.  Unfortunately, she says she cannot pick up any other medications for the next few weeks until she gets her paycheck.  Encouraged patient to continue taking her blood pressure medications and to never stop clonidine cold [place].  I would like to see patient back in 1 week to reassess, even if patient can just come in to nurse clinic for a BP check.  Bilateral carpal tunnel syndrome Provided patient with 1 arm brace for our clinic so that she can use her other one on the other hand.  Encourage patient to wear them every night and that this is the first intervention for carpal tunnel.  Advised patient that her symptoms will hopefully improve slowly over the next 6 to 8 weeks.    Maia Breslow, M.D. 12/15/2018, 10:23 AM PGY-2, [place]

## 2018-12-15 NOTE — Assessment & Plan Note (Signed)
Hemoglobin A1c was 5.9 today.  Congratulated patient and encouraged her to continue taking metformin 500 mg once per day.  Also congratulated her on her healthy diet and exercise.

## 2018-12-15 NOTE — Assessment & Plan Note (Signed)
Headaches seem like migraine headaches.  Since Topamax is not helpful for the patient, will stop this medication.  Patient is getting these frequent enough to warrant prophylactic medications, so will start propranolol 40 mg twice daily.  We can titrate up from there.  This may also help with her blood pressure.

## 2018-12-15 NOTE — Patient Instructions (Addendum)
It was nice meeting you today Ms. Gorley!  Your A1c was 5.9 today which is excellent.  Keep taking metformin once per day.  For your migraines, I am starting propranolol, which you need to take once in the morning and once in the evening.  We can increase the dose of this medication over time.  The purpose of this medication is to prevent future migraines.  Please come in in a few weeks to discuss your breast concerns.  If you have any questions or concerns, please feel free to call the clinic.   Be well,  Dr. Shan Levans

## 2018-12-16 ENCOUNTER — Telehealth: Payer: Self-pay | Admitting: *Deleted

## 2018-12-16 NOTE — Telephone Encounter (Signed)
LVM to call office back and inform her of below and to get a nurse visit scheduled.  Please assist her in getting this done if she calls back. [name], [name], [name]

## 2018-12-16 NOTE — Telephone Encounter (Signed)
-----   Message from Kathrene Alu, MD sent at 12/15/2018 10:24 AM EST ----- Regarding: Patient needs BP check Would you call Ms. Iacovelli and ask her to make an appointment with our nurse clinic for a blood pressure check sometime in the next week?  Thank you  -Estill Bamberg

## 2018-12-17 NOTE — Telephone Encounter (Signed)
Tried to contact pt and phone stated that call could not be completed at this time will try again later. [name], Amanii Snethen D, [name]

## 2018-12-17 NOTE — Telephone Encounter (Signed)
Contacted pt and scheduled her a nurse visit for 12/23/18 to have BP checked per Dr. Shan Levans. [name], Hershel Corkery D, [name]

## 2018-12-23 ENCOUNTER — Other Ambulatory Visit: Payer: Self-pay | Admitting: Internal Medicine

## 2018-12-23 ENCOUNTER — Other Ambulatory Visit (HOSPITAL_COMMUNITY): Payer: Self-pay | Admitting: Psychiatry

## 2018-12-23 ENCOUNTER — Other Ambulatory Visit: Payer: Self-pay | Admitting: *Deleted

## 2018-12-23 ENCOUNTER — Ambulatory Visit (INDEPENDENT_AMBULATORY_CARE_PROVIDER_SITE_OTHER): Payer: 59 | Admitting: Family Medicine

## 2018-12-23 DIAGNOSIS — F330 Major depressive disorder, recurrent, mild: Secondary | ICD-10-CM

## 2018-12-23 DIAGNOSIS — I10 Essential (primary) hypertension: Secondary | ICD-10-CM

## 2018-12-23 MED ORDER — LOSARTAN POTASSIUM 25 MG PO TABS: 25.0000 mg | ORAL_TABLET | Freq: Every day | ORAL | 2 refills | Status: DC

## 2018-12-23 MED ORDER — CLONIDINE HCL 0.1 MG PO TABS: 0.1000 mg | ORAL_TABLET | Freq: Two times a day (BID) | ORAL | 0 refills | Status: DC

## 2018-12-23 MED FILL — CloNIDine HCL 0.1 MG TAB: 0.1 | 30 days supply | Qty: 60 | Fill #0

## 2018-12-23 NOTE — Progress Notes (Signed)
Pt is here for a BP check today.  Her BP in left arm with a regular cuff was 180/92.  Pulse 72.  Her last dose of BP meds were last night.  She reports taking HCTZ and clonidine but seemed very withholding at first.   Pt has no more meds at home, and will not be able to pick up new ones until 12/28/18 due financial reasons.   Since pt is out of clonidine which should not be stopped suddenly I spoke with Dr. Andria Frames who wants to change clonidine to BID and we will send her to [facility] outpatient which will cost $9.  Per [place], she is to pick up and take losartan and HCTZ on 12/28/18 and followup with provider on 01/04/19.  Red flags given to call us back.  Fleeger, Salome Spotted, CMA

## 2018-12-23 NOTE — Progress Notes (Signed)
Patient ID: [name], female   DOB: 21-Jan-1951, 68 y.o.   MRN: [id number] I discussed the case with nurse Fleeger.  I agree with her documentation.

## 2018-12-23 NOTE — Patient Instructions (Addendum)
-   Take clonodine 0.1mg  Once in the am and once in the pm. - Go to [facility] outpatient [facility].  The clonidine should cost you $9. -On Tuesday when you get paid, please pick up the losartan and hydrochlorothiazide and continue taking them like before.  -Come back to see Dr. Shan Levans on 01/04/19 @ 10:10am

## 2018-12-28 ENCOUNTER — Ambulatory Visit (HOSPITAL_COMMUNITY): Payer: 59 | Admitting: Psychiatry

## 2018-12-28 ENCOUNTER — Other Ambulatory Visit (HOSPITAL_COMMUNITY): Payer: Self-pay

## 2018-12-28 DIAGNOSIS — F330 Major depressive disorder, recurrent, mild: Secondary | ICD-10-CM

## 2018-12-28 MED ORDER — BUPROPION HCL ER (XL) 300 MG PO TB24: 300.0000 mg | ORAL_TABLET | ORAL | 0 refills | Status: DC

## 2018-12-31 ENCOUNTER — Encounter (HOSPITAL_COMMUNITY): Payer: Self-pay | Admitting: Psychiatry

## 2018-12-31 ENCOUNTER — Ambulatory Visit (INDEPENDENT_AMBULATORY_CARE_PROVIDER_SITE_OTHER): Payer: 59 | Admitting: Psychiatry

## 2018-12-31 VITALS — BP 158/88 | HR 68 | Ht 60.0 in | Wt 154.0 lb

## 2018-12-31 DIAGNOSIS — F411 Generalized anxiety disorder: Secondary | ICD-10-CM | POA: Diagnosis not present

## 2018-12-31 DIAGNOSIS — F330 Major depressive disorder, recurrent, mild: Secondary | ICD-10-CM | POA: Diagnosis not present

## 2018-12-31 MED ORDER — MIRTAZAPINE 30 MG PO TABS: ORAL_TABLET | ORAL | 0 refills | Status: DC

## 2018-12-31 MED ORDER — DIVALPROEX SODIUM 500 MG PO DR TAB: 500.0000 mg | DELAYED_RELEASE_TABLET | Freq: Every day | ORAL | 0 refills | Status: DC

## 2018-12-31 MED ORDER — BUPROPION HCL ER (XL) 300 MG PO TB24: 300.0000 mg | ORAL_TABLET | ORAL | 0 refills | Status: DC

## 2018-12-31 NOTE — Progress Notes (Signed)
[facility] MD/PA/NP OP Progress Note  12/31/2018 10:12 AM [name]  MRN:  [id number]  Chief Complaint: I am feeling better since Depakote increase.  My sleep is much better.  I do not get irritable or upset.  HPI: Patient came for her appointment.  On her last visit we increased Depakote 500 mg and she noticed much improvement in her mood, irritability and sleep.  She is somewhat concerned about her blood pressure.  Recently her blood pressure medicine were changed and she is scheduled to see her physician coming Tuesday.  Overall she describes her psychiatric medicine working very well.  She had a good Christmas.  She denies any panic attack or any anxiety attack.  She denies any crying spells or feeling of hopelessness or worthlessness.  Her last hemoglobin A1c is also improved.  She is watching her calorie intake regularly.  Patient denies drinking or using any illegal substances.  She lives by herself.  She has no tremors, shakes or any EPS.  Visit Diagnosis:    ICD-10-CM   1. GAD (generalized anxiety disorder) F41.1 buPROPion (WELLBUTRIN XL) 300 MG 24 hr tablet  2. Major depressive disorder, recurrent episode, mild (HCC) F33.0 mirtazapine (REMERON) 30 MG tablet    divalproex (DEPAKOTE) 500 MG DR tablet    buPROPion (WELLBUTRIN XL) 300 MG 24 hr tablet    Past Psychiatric History: Reviewed. H/O inpatient treatment at [facility] to detox from Xanax. Saw Psychiatrist in [place], [place] and [facility]. Tried Paxil,Prozac, vistaril andZoloft. No h/o suicidal attempt or any psychosis.  Past Medical History:  Past Medical History:  Diagnosis Date  . Adenomatous colon polyp   . Anxiety   . Asthma   . Back pain with radiation    radiopathy   . COPD (chronic obstructive pulmonary disease) ([place])   . Corneal abrasion, left 04/28/2013  . Depression   . GERD (gastroesophageal reflux disease)   . Hx of migraines   . Hyperlipidemia   . Hypertension   .  Nicotine addiction   . PANIC ATTACK 11/05/2007  . PELVIC  PAIN 08/13/2010  . Urinary incontinence     Past Surgical History:  Procedure Laterality Date  . bleeding ulcer repair  1992   Open Ex Lap with perforation of duodenal ulcer with pertitonitis  . CHOLECYSTECTOMY  1999  . HIATAL HERNIA REPAIR  1967  . left ankle surgery  2008  . removal of mild ducts from left breast  1982  . SIMPLE MASTECTOMY Bilateral 06/04/2010  . TOTAL ABDOMINAL HYSTERECTOMY W/ BILATERAL SALPINGOOPHORECTOMY  1989  . TUBAL LIGATION  1975, 1978   x 2    Family Psychiatric History: Reviewed  Family History:  Family History  Problem Relation Age of Onset  . Heart disease Mother   . Diabetes Mother   . Lupus Mother   . Colon polyps Mother   . Tuberculosis Father   . Depression Sister   . Diabetes Brother   . Colon polyps Maternal Aunt   . Heart disease Maternal Grandmother   . Heart attack Maternal Aunt   . Stomach cancer Sister   . Endometriosis Daughter   . Diabetes Daughter   . Colon cancer Neg Hx   . Esophageal cancer Neg Hx   . Pancreatic cancer Neg Hx   . Rectal cancer Neg Hx     Social History:  Social History   Socioeconomic History  . Marital status: Legally Separated    Spouse name: Not on  file  . Number of children: 3  . Years of education: Not on file  . Highest education level: Not on file  Occupational History  . Occupation: retired  Scientific laboratory technician  . Financial resource strain: Not on file  . Food insecurity:    Worry: Not on file    Inability: Not on file  . Transportation needs:    Medical: Not on file    Non-medical: Not on file  Tobacco Use  . Smoking status: Former Smoker    Packs/day: 0.25    Years: 42.00    Pack years: 10.50    Types: Cigarettes    Last attempt to quit: 07/06/2017    Years since quitting: 1.4  . Smokeless tobacco: Never Used  Substance and Sexual Activity  . Alcohol use: No    Alcohol/week: 0.0 standard drinks  . Drug use: No  . Sexual  activity: Never    Birth control/protection: Post-menopausal  Lifestyle  . Physical activity:    Days per week: Not on file    Minutes per session: Not on file  . Stress: Not on file  Relationships  . Social connections:    Talks on phone: Not on file    Gets together: Not on file    Attends religious service: Not on file    Active member of club or organization: Not on file    Attends meetings of clubs or organizations: Not on file    Relationship status: Not on file  Other Topics Concern  . Not on file  Social History Narrative  . Not on file    Allergies:  Allergies  Allergen Reactions  . Aspirin Other (See Comments)    Dr told pt not to take asa d/t bleeding ulcer in 1985.  [name] Penicillins Nausea Only    Tablets only.  Denies any airway involvement.    . Latex Rash    Relieved with over the counter medications.    Metabolic Disorder Labs: Lab Results  Component Value Date   HGBA1C 5.9 (A) 12/15/2018   MPG 126 (H) 01/20/2012   No results found for: PROLACTIN Lab Results  Component Value Date   CHOL 219 (H) 09/04/2016   TRIG 132 09/04/2016   HDL 40 (L) 09/04/2016   CHOLHDL 5.5 (H) 09/04/2016   VLDL 26 09/04/2016   LDLCALC 153 (H) 09/04/2016   LDLCALC 107 (H) 05/17/2015   Lab Results  Component Value Date   TSH 0.56 09/04/2016   TSH 1.793 12/08/2013    Therapeutic Level Labs: No results found for: LITHIUM No results found for: VALPROATE No components found for:  CBMZ  Current Medications: Current Outpatient Medications  Medication Sig Dispense Refill  . albuterol (PROAIR HFA) 108 (90 Base) MCG/ACT inhaler Inhale 2 puffs as directed into the lungs. 1 Inhaler 3  . buPROPion (WELLBUTRIN XL) 300 MG 24 hr tablet Take 1 tablet (300 mg total) by mouth every morning. 30 tablet 0  . cetirizine (ZYRTEC) 10 MG tablet Take 1 tablet (10 mg total) by mouth daily. 30 tablet 11  . cloNIDine (CATAPRES) 0.1 MG tablet Take 1 tablet (0.1 mg total) by mouth 2 (two) times  daily. 60 tablet 0  . clotrimazole (LOTRIMIN) 1 % cream Apply 1 application topically 2 (two) times daily. 30 g 0  . divalproex (DEPAKOTE) 500 MG DR tablet Take 1 tablet (500 mg total) by mouth at bedtime. 90 tablet 0  . fluconazole (DIFLUCAN) 150 MG tablet TAKE 1 TABLET(150 MG) BY MOUTH  1 TIME 1 tablet 0  . fluticasone (FLONASE) 50 MCG/ACT nasal spray Place 2 sprays into both nostrils daily. 16 g 6  . hydrochlorothiazide (HYDRODIURIL) 12.5 MG tablet Take 1 tablet (12.5 mg total) daily by mouth. 30 tablet 5  . lidocaine (XYLOCAINE) 2 % solution Use as directed 20 mLs as needed in the mouth or throat for mouth pain. 100 mL 0  . LINZESS 290 MCG CAPS capsule TAKE 1 CAPSULE BY MOUTH DAILY BEFORE BREAKFAST 90 capsule 0  . losartan (COZAAR) 25 MG tablet Take 1 tablet (25 mg total) by mouth daily. 30 tablet 2  . metFORMIN (GLUCOPHAGE) 500 MG tablet Take 1 tablet (500 mg total) by mouth 2 (two) times daily with a meal. 180 tablet 3  . mirtazapine (REMERON) 30 MG tablet TAKE 1 TABLET(30 MG) BY MOUTH AT BEDTIME 90 tablet 0  . pantoprazole (PROTONIX) 40 MG tablet Take 1 tablet (40 mg total) by mouth daily. 90 tablet 2  . potassium chloride SA (K-DUR,KLOR-CON) 20 MEQ tablet Take 1 tablet (20 mEq total) daily by mouth. 30 tablet 0  . propranolol (INDERAL) 40 MG tablet Take 1 tablet (40 mg total) by mouth 2 (two) times daily. 60 tablet 11  . ranitidine (ZANTAC) 150 MG tablet Take 1 tablet (150 mg total) by mouth at bedtime. 90 tablet 2  . valACYclovir (VALTREX) 500 MG tablet Take 1 tablet (500 mg total) by mouth 2 (two) times daily. x5 days as needed for flares 30 tablet 3   No current facility-administered medications for this visit.      Musculoskeletal: Strength & Muscle Tone: within normal limits Gait & Station: normal Patient leans: N/A  Psychiatric Specialty Exam: ROS  There were no vitals taken for this visit.There is no height or weight on file to calculate BMI.  General Appearance: Casual   Eye Contact:  Good  Speech:  Clear and Coherent  Volume:  Normal  Mood:  Euthymic  Affect:  Appropriate  Thought Process:  Goal Directed  Orientation:  Full (Time, Place, and Person)  Thought Content: Logical   Suicidal Thoughts:  No  Homicidal Thoughts:  No  Memory:  Immediate;   Good Recent;   Good Remote;   Good  Judgement:  Good  Insight:  Good  Psychomotor Activity:  Normal  Concentration:  Concentration: Good and Attention Span: Fair  Recall:  Good  Fund of Knowledge: Good  Language: Good  Akathisia:  No  Handed:  Right  AIMS (if indicated): not done  Assets:  Communication Skills Desire for Improvement Housing Resilience  ADL's:  Intact  Cognition: WNL  Sleep:  Good   Screenings: PHQ2-9     Office Visit from 12/15/2018 in [facility] Office Visit from 11/05/2018 in [facility] Office Visit from 09/07/2018 in [facility] Office Visit from 07/14/2018 in [facility] Office Visit from 02/24/2018 in [facility]  PHQ-2 Total Score  0  0  0  2  2       Assessment and Plan: Major depressive disorder, recurrent.  Generalized anxiety disorder.  Patient doing much better since Depakote dose increase.  I will continue Remeron 30 mg at bedtime, Depakote 500 mg at bedtime and Wellbutrin XL 3 mg daily.  Patient is not interested in therapy.  She has no tremors, shakes or any EPS.  We will do Depakote level on her next appointment.  Recommended to call us  back if she is any question or any concern.  Follow-up in 3 months.   Kathlee Nations, MD 12/31/2018, 10:12 AM

## 2019-01-04 ENCOUNTER — Other Ambulatory Visit: Payer: Self-pay

## 2019-01-04 ENCOUNTER — Ambulatory Visit (INDEPENDENT_AMBULATORY_CARE_PROVIDER_SITE_OTHER): Payer: 59 | Admitting: Family Medicine

## 2019-01-04 VITALS — BP 134/70 | HR 65 | Temp 97.8°F | Ht 61.0 in | Wt 154.2 lb

## 2019-01-04 DIAGNOSIS — B009 Herpesviral infection, unspecified: Secondary | ICD-10-CM | POA: Diagnosis not present

## 2019-01-04 DIAGNOSIS — N3946 Mixed incontinence: Secondary | ICD-10-CM

## 2019-01-04 DIAGNOSIS — J302 Other seasonal allergic rhinitis: Secondary | ICD-10-CM | POA: Diagnosis not present

## 2019-01-04 DIAGNOSIS — I10 Essential (primary) hypertension: Secondary | ICD-10-CM

## 2019-01-04 DIAGNOSIS — J3089 Other allergic rhinitis: Secondary | ICD-10-CM

## 2019-01-04 MED ORDER — FLUTICASONE PROPIONATE 50 MCG/ACT NA SUSP: 2.0000 | Freq: Every day | NASAL | 6 refills | Status: DC

## 2019-01-04 MED ORDER — VALACYCLOVIR HCL 500 MG PO TABS: 500.0000 mg | ORAL_TABLET | Freq: Two times a day (BID) | ORAL | 3 refills | Status: DC

## 2019-01-04 MED ORDER — CLOTRIMAZOLE 1 % EX CREA: 1.0000 "application " | TOPICAL_CREAM | Freq: Two times a day (BID) | CUTANEOUS | 0 refills | Status: DC

## 2019-01-04 NOTE — Progress Notes (Signed)
Subjective:    [name] - 68 y.o. female MRN [id number]  Date of birth: 12/16/1950  CC:  [name] is here for follow up of high blood pressure.  She would also like to discuss her urinary incontinence and sore throat.  HPI: High blood pressure - says that her blood pressure has improved since she is taking her Losartan now -Says that she was scared to take her losartan along with her HCTZ and clonidine because she was worried that it would drop her pressure too low, but she has taken her losartan for the past few days -Continues to take HCTZ daily and clonidine twice daily  Urinary incontinence -Endorses feelings of pelvic pressure, stress incontinence, and urge incontinence -Has been working on timed voiding as we have discussed in the past, which she says is helpful -Patient did not go to her gynecologist appointment after her referral a previous visit, but she would be possibly interested in getting a pessary to improve her incontinence after she read more about it -Would like to forego incontinence medications until she considers a pessary first  Sore throat -Occurs intermittently -Sometimes has some pain with swallowing and the feeling of food getting stuck in her throat -Has had a dry cough and some rhinorrhea as well -Sometimes feels like she has drainage down the back of her throat  Health Maintenance:  Health Maintenance Due  Topic Date Due  . FOOT EXAM  05/08/1960  . OPHTHALMOLOGY EXAM  05/08/1960  . TETANUS/TDAP  06/12/2014  - will be getting her eyes checked soon at [facility]  -  reports that she quit smoking about 17 months ago. Her smoking use included cigarettes. She has a 10.50 pack-year smoking history. She has never used smokeless tobacco. - Review of Systems: Per HPI. - Past Medical History: Patient Active Problem List   Diagnosis Date Noted  . Mixed urge and stress incontinence 09/07/2018  . Dizziness 09/07/2018  .  Vaginal irritation 07/15/2018  . Type 2 diabetes, diet controlled ([place]) 02/24/2018  . HSV-2 (herpes simplex virus 2) infection 02/29/2016  . Seborrheic keratoses, inflamed 02/29/2016  . Vulvovaginal candidiasis 05/31/2015  . Itching in the vaginal area 05/17/2015  . Cough 10/02/2014  . Allergic rhinitis 04/25/2014  . Skin lesions 12/08/2013  . Major depressive disorder, recurrent episode, severe, without mention of psychotic behavior 09/08/2012  . Healthcare maintenance 06/13/2012  . Vitamin D deficiency 06/13/2012  . Sleep difficulties 06/13/2012  . Hyperlipidemia - Mixed 05/17/2012  . Migraine 01/20/2012  . COPD (chronic obstructive pulmonary disease) ([place]) 03/05/2011  . Bilateral carpal tunnel syndrome 10/27/2010  . BACK PAIN WITH RADICULOPATHY 08/17/2008  . Essential hypertension, benign 11/05/2007  . GERD 11/05/2007   - Medications: reviewed and updated   Objective:   Physical Exam BP 134/70   Pulse 65   Temp 97.8 F (36.6 C) (Oral)   Ht 5\' 1"  (1.549 m)   Wt 154 lb 3.2 oz (69.9 kg)   SpO2 99%   BMI 29.14 kg/m  Gen: NAD, alert, cooperative with exam, well-appearing HEENT: Some mucus is visualized on the back of the throat, although oropharynx does not appear erythematous or swollen, no rhinorrhea or cough present during exam CV: RRR, good S1/S2, no murmur, no edema Resp: CTABL, no wheezes, non-labored Psych: good insight, alert and oriented        Assessment & Plan:   Mixed urge and stress incontinence Explained the pathophysiology of patient's incontinence and how a pessary might  help her.  Patient is interested in this option, but she would like to read more about it.  Printed off information about pessaries and offered to refer her again to a gynecologist if she would like to go this route.  Allergic rhinitis Patient symptoms appear consistent with this diagnosis, and she has had Flonase in the past.  Prescribed Flonase again and explained its use to the  patient.  Essential hypertension, benign Congratulated patient on adhering to her blood pressure medicines and encouraged her to continue to do so.  Explained how losartan works and that it is a well-tolerated medication.  Would like to see her back in 1 month to check her blood pressure again and continue talking about her other concerns.    Maia Breslow, M.D. 01/04/2019, 12:43 PM PGY-2, [place]

## 2019-01-04 NOTE — Assessment & Plan Note (Signed)
Explained the pathophysiology of patient's incontinence and how a pessary might help her.  Patient is interested in this option, but she would like to read more about it.  Printed off information about pessaries and offered to refer her again to a gynecologist if she would like to go this route.

## 2019-01-04 NOTE — Assessment & Plan Note (Signed)
Patient symptoms appear consistent with this diagnosis, and she has had Flonase in the past.  Prescribed Flonase again and explained its use to the patient.

## 2019-01-04 NOTE — Patient Instructions (Addendum)
It was nice seeing you today [name]!  I am giving you some information about how pessaries work.  I am happy to refer you to a gynecologist to get this placed if you are interested.  You can always just give our clinic a call if you are interested or we can talk about it at your next appointment.  Your blood pressure is much improved today.  Please continue taking all of your blood pressure medicines and let me know if you need refills.  I have refilled your clotrimazole, Valtrex, and restarted your prescription of Flonase to help with your throat soreness and cough.  I like to see you back in about 1 month or earlier if needed.  If you have any questions or concerns, please feel free to call the clinic.   Be well,  Dr. Shan Levans

## 2019-01-04 NOTE — Assessment & Plan Note (Signed)
Congratulated patient on adhering to her blood pressure medicines and encouraged her to continue to do so.  Explained how losartan works and that it is a well-tolerated medication.  Would like to see her back in 1 month to check her blood pressure again and continue talking about her other concerns.

## 2019-01-18 ENCOUNTER — Telehealth (INDEPENDENT_AMBULATORY_CARE_PROVIDER_SITE_OTHER): Payer: 59 | Admitting: Internal Medicine

## 2019-01-18 ENCOUNTER — Other Ambulatory Visit: Payer: Self-pay

## 2019-01-18 DIAGNOSIS — K5904 Chronic idiopathic constipation: Secondary | ICD-10-CM | POA: Diagnosis not present

## 2019-01-18 DIAGNOSIS — B354 Tinea corporis: Secondary | ICD-10-CM

## 2019-01-18 DIAGNOSIS — K219 Gastro-esophageal reflux disease without esophagitis: Secondary | ICD-10-CM

## 2019-01-18 MED ORDER — LINACLOTIDE 290 MCG PO CAPS: ORAL_CAPSULE | ORAL | 3 refills | Status: DC

## 2019-01-18 MED ORDER — CLOTRIMAZOLE 1 % EX CREA: 1.0000 "application " | TOPICAL_CREAM | Freq: Two times a day (BID) | CUTANEOUS | 0 refills | Status: DC

## 2019-01-18 MED ORDER — PANTOPRAZOLE SODIUM 40 MG PO TBEC: 40.0000 mg | DELAYED_RELEASE_TABLET | Freq: Every day | ORAL | 3 refills | Status: DC

## 2019-01-18 NOTE — Patient Instructions (Signed)
You will be due for a recall colonoscopy in October 2020. We will send you a reminder in the mail when it gets closer to that time.  We have sent the following medications to your pharmacy for you to pick up at your convenience: Linzess 290 mcg daily Pantoprazole 40 mg daily  Clotrimazole cream twice daily  Please purchase the following medications over the counter and take as directed: Preparation H Suppositories- 1 suppository every night x 4-5 nights  Discontinue Zantac (ranitidine).

## 2019-01-18 NOTE — Progress Notes (Signed)
HPI: Telephonic/WebEx visit  Service provided via telemedicine with patient consent Location of the patient: Home Location of provider: Office Person participating in the call, myself, patient and Quintin Alto, [place] Time spent on call: 16 minutes  [name] is a 68 year old with history of chronic constipation, GERD, colon polyps due follow-up performed by telephone due to COVID-19 pandemic.  Last seen 11/04/2017 in the office.  Overall feeling fairly well.  She is having issues with some fungal rash under her abdominal skin folds extending towards the rectum.  Some mild rectal discomfort/abnormal sensation.  Does not hurt to defecate.  No rectal bleeding.  No melena.  Linzess is working well.  No abdominal pain.  Reflux is a whole lot better.  Using pantoprazole once a day, not needing ranitidine.  Linzess still working at 290 mcg.  No bleeding.  No abd pain.  Reflux is a whole lot better.  No dysphagia.  Using once daily.  Not using ranitidine.    Rash under stomach, using anti-fungal powder. Helps but no fully.    Past Medical History:  Diagnosis Date  . Adenomatous colon polyp   . Anxiety   . Asthma   . Back pain with radiation    radiopathy   . COPD (chronic obstructive pulmonary disease) ([place])   . Corneal abrasion, left 04/28/2013  . Depression   . GERD (gastroesophageal reflux disease)   . Hx of migraines   . Hyperlipidemia   . Hypertension   . Nicotine addiction   . PANIC ATTACK 11/05/2007  . PELVIC  PAIN 08/13/2010  . Urinary incontinence     Past Surgical History:  Procedure Laterality Date  . bleeding ulcer repair  1992   Open Ex Lap with perforation of duodenal ulcer with pertitonitis  . CHOLECYSTECTOMY  1999  . HIATAL HERNIA REPAIR  1967  . left ankle surgery  2008  . removal of mild ducts from left breast  1982  . SIMPLE MASTECTOMY Bilateral 06/04/2010  . TOTAL ABDOMINAL HYSTERECTOMY W/ BILATERAL SALPINGOOPHORECTOMY  1989  . TUBAL LIGATION  1975, 1978    x 2    Outpatient Medications Prior to Visit  Medication Sig Dispense Refill  . albuterol (PROAIR HFA) 108 (90 Base) MCG/ACT inhaler Inhale 2 puffs as directed into the lungs. 1 Inhaler 3  . buPROPion (WELLBUTRIN XL) 300 MG 24 hr tablet Take 1 tablet (300 mg total) by mouth every morning. 30 tablet 0  . cetirizine (ZYRTEC) 10 MG tablet Take 1 tablet (10 mg total) by mouth daily. 30 tablet 11  . cloNIDine (CATAPRES) 0.1 MG tablet Take 1 tablet (0.1 mg total) by mouth 2 (two) times daily. 60 tablet 0  . clotrimazole (LOTRIMIN) 1 % cream Apply 1 application topically 2 (two) times daily. 30 g 0  . divalproex (DEPAKOTE) 500 MG DR tablet Take 1 tablet (500 mg total) by mouth at bedtime. 90 tablet 0  . fluconazole (DIFLUCAN) 150 MG tablet TAKE 1 TABLET(150 MG) BY MOUTH 1 TIME 1 tablet 0  . fluticasone (FLONASE) 50 MCG/ACT nasal spray Place 2 sprays into both nostrils daily. 16 g 6  . hydrochlorothiazide (HYDRODIURIL) 12.5 MG tablet Take 1 tablet (12.5 mg total) daily by mouth. 30 tablet 5  . lidocaine (XYLOCAINE) 2 % solution Use as directed 20 mLs as needed in the mouth or throat for mouth pain. 100 mL 0  . LINZESS 290 MCG CAPS capsule TAKE 1 CAPSULE BY MOUTH DAILY BEFORE BREAKFAST 90 capsule 0  . losartan (COZAAR)  25 MG tablet Take 1 tablet (25 mg total) by mouth daily. 30 tablet 2  . metFORMIN (GLUCOPHAGE) 500 MG tablet Take 1 tablet (500 mg total) by mouth 2 (two) times daily with a meal. 180 tablet 3  . mirtazapine (REMERON) 30 MG tablet TAKE 1 TABLET(30 MG) BY MOUTH AT BEDTIME 90 tablet 0  . pantoprazole (PROTONIX) 40 MG tablet Take 1 tablet (40 mg total) by mouth daily. 90 tablet 2  . potassium chloride SA (K-DUR,KLOR-CON) 20 MEQ tablet Take 1 tablet (20 mEq total) daily by mouth. 30 tablet 0  . propranolol (INDERAL) 40 MG tablet Take 1 tablet (40 mg total) by mouth 2 (two) times daily. 60 tablet 11  . ranitidine (ZANTAC) 150 MG tablet Take 1 tablet (150 mg total) by mouth at bedtime. 90  tablet 2  . valACYclovir (VALTREX) 500 MG tablet Take 1 tablet (500 mg total) by mouth 2 (two) times daily. x5 days as needed for flares 30 tablet 3   No facility-administered medications prior to visit.     Allergies  Allergen Reactions  . Aspirin Other (See Comments)    Dr told pt not to take asa d/t bleeding ulcer in 1985.  [name] Penicillins Nausea Only    Tablets only.  Denies any airway involvement.    . Latex Rash    Relieved with over the counter medications.    Family History  Problem Relation Age of Onset  . Heart disease Mother   . Diabetes Mother   . Lupus Mother   . Colon polyps Mother   . Tuberculosis Father   . Depression Sister   . Diabetes Brother   . Colon polyps Maternal Aunt   . Heart disease Maternal Grandmother   . Heart attack Maternal Aunt   . Stomach cancer Sister   . Endometriosis Daughter   . Diabetes Daughter   . Colon cancer Neg Hx   . Esophageal cancer Neg Hx   . Pancreatic cancer Neg Hx   . Rectal cancer Neg Hx     Social History   Tobacco Use  . Smoking status: Former Smoker    Packs/day: 0.25    Years: 42.00    Pack years: 10.50    Types: Cigarettes    Last attempt to quit: 07/06/2017    Years since quitting: 1.5  . Smokeless tobacco: Never Used  Substance Use Topics  . Alcohol use: No    Alcohol/week: 0.0 standard drinks  . Drug use: No    ROS: As per history of present illness, otherwise negative  There were no vitals taken for this visit. No physical examination due to virtual visit  ASSESSMENT/PLAN: 68 year old female with chronic constipation, GERD and colon polyps  1. Chronic constipation --we will continue Linzess 290 mcg daily  2.  GERD --stable continue pantoprazole 40 mg daily  3.  Fungal rash --clotrimazole cream twice daily to involved skin folds until resolved.  Notify me if not improving  4.  Rectal discomfort --trial of Preparation H suppositories nightly for 3 to 5 days.  Notify me if not better.  5.   History of colon polyps --surveillance colonoscopy recommended in October 2020       Cc:Kathrene Alu, Md [address] [place], [place] [address]

## 2019-02-01 ENCOUNTER — Ambulatory Visit: Payer: Self-pay | Admitting: Family Medicine

## 2019-03-08 ENCOUNTER — Other Ambulatory Visit: Payer: Self-pay

## 2019-03-08 MED ORDER — CETIRIZINE HCL 10 MG PO TABS: 10.0000 mg | ORAL_TABLET | Freq: Every day | ORAL | 11 refills | Status: DC

## 2019-03-26 ENCOUNTER — Other Ambulatory Visit: Payer: Self-pay | Admitting: Family Medicine

## 2019-03-29 ENCOUNTER — Other Ambulatory Visit: Payer: Self-pay

## 2019-03-29 MED ORDER — METFORMIN HCL ER 500 MG PO TB24: 500.0000 mg | ORAL_TABLET | Freq: Two times a day (BID) | ORAL | 3 refills | Status: DC

## 2019-03-31 ENCOUNTER — Ambulatory Visit (INDEPENDENT_AMBULATORY_CARE_PROVIDER_SITE_OTHER): Payer: 59 | Admitting: Psychiatry

## 2019-03-31 ENCOUNTER — Encounter (HOSPITAL_COMMUNITY): Payer: Self-pay | Admitting: Psychiatry

## 2019-03-31 ENCOUNTER — Other Ambulatory Visit: Payer: Self-pay

## 2019-03-31 DIAGNOSIS — F411 Generalized anxiety disorder: Secondary | ICD-10-CM

## 2019-03-31 DIAGNOSIS — F330 Major depressive disorder, recurrent, mild: Secondary | ICD-10-CM | POA: Diagnosis not present

## 2019-03-31 MED ORDER — DIVALPROEX SODIUM 500 MG PO DR TAB: 500.0000 mg | DELAYED_RELEASE_TABLET | Freq: Every day | ORAL | 0 refills | Status: DC

## 2019-03-31 MED ORDER — MIRTAZAPINE 30 MG PO TABS: ORAL_TABLET | ORAL | 0 refills | Status: DC

## 2019-03-31 MED ORDER — BUPROPION HCL ER (XL) 300 MG PO TB24: 300.0000 mg | ORAL_TABLET | ORAL | 0 refills | Status: DC

## 2019-03-31 NOTE — Progress Notes (Signed)
Virtual Visit via Telephone Note  I connected with [name] on 03/31/19 at  3:40 PM EDT by telephone and verified that I am speaking with the correct person using two identifiers.   I discussed the limitations, risks, security and privacy concerns of performing an evaluation and management service by telephone and the availability of in person appointments. I also discussed with the patient that there may be a patient responsible charge related to this service. The patient expressed understanding and agreed to proceed.   History of Present Illness: Patient was evaluated by phone session.  She is taking Depakote Wellbutrin and Remeron and reported no concerns.  She is sleeping better.  Since we adjusted Depakote few months ago her sleep is improved.  She denies any irritability, anger, mania, psychosis.  Due to COVID-19 she does not leave the house and her daughter helps her a lot.  She reported no tremors, shakes or any EPS.  She denies any crying spells or any feeling of hopelessness or worthlessness.  She try to watch her calorie intake but due to COVID-19 she believes she may have gained few pounds.  She denies drinking or using any illegal substances.  She lives by herself but her daughter is very close and very involved in her daily life.  Her appetite is okay.  Her energy level is good.  She denies any suicidal thoughts or any major anxiety attack in recent months.  Past Psychiatric History: Reviewed. [facility]. Saw Psychiatrist in [place] and [facility]. TriedPaxil,Prozac, vistaril andZoloft. Noh/o suicidal attempt or any psychosis.     Psychiatric Specialty Exam: Physical Exam  ROS  There were no vitals taken for this visit.There is no height or weight on file to calculate BMI.  General Appearance: NA  Eye Contact:  NA  Speech:  Clear and Coherent  Volume:  Normal  Mood:   Euthymic  Affect:  Appropriate  Thought Process:  Goal Directed  Orientation:  Full (Time, Place, and Person)  Thought Content:  Logical  Suicidal Thoughts:  No  Homicidal Thoughts:  No  Memory:  Immediate;   Good Recent;   Good Remote;   Good  Judgement:  Good  Insight:  Good  Psychomotor Activity:  Normal  Concentration:  Concentration: Good and Attention Span: Good  Recall:  Good  Fund of Knowledge:  Good  Language:  Good  Akathisia:  No  Handed:  Right  AIMS (if indicated):     Assets:  Communication Skills Desire for Improvement Housing Resilience Social Support Transportation  ADL's:  Intact  Cognition:  WNL  Sleep:   good      Assessment and Plan: Major depressive disorder, recurrent.  Generalized anxiety disorder.  Patient doing better on her current medication.  She does not want to change medication.  I will continue Remeron 30 mg at bedtime, Depakote 500 mg at bedtime and Wellbutrin XL 300 mg daily.  Discussed medication side effects and benefits.  She has no tremors shakes or any EPS.  We will do Depakote level on her next appointment in house.  I recommended to call us back if she has any question or any concern.  She is not interested in therapy.  Follow-up in 3 months.  Follow Up Instructions:    I discussed the assessment and treatment plan with the patient. The patient was provided an opportunity to ask questions and all were answered. The patient agreed with  the plan and demonstrated an understanding of the instructions.   The patient was advised to call back or seek an in-person evaluation if the symptoms worsen or if the condition fails to improve as anticipated.  I provided 15 minutes of non-face-to-face time during this encounter.   Kathlee Nations, MD

## 2019-05-06 ENCOUNTER — Ambulatory Visit: Payer: 59 | Admitting: Family Medicine

## 2019-05-13 ENCOUNTER — Ambulatory Visit (INDEPENDENT_AMBULATORY_CARE_PROVIDER_SITE_OTHER): Payer: 59 | Admitting: Family Medicine

## 2019-05-13 ENCOUNTER — Encounter: Payer: Self-pay | Admitting: Family Medicine

## 2019-05-13 ENCOUNTER — Other Ambulatory Visit: Payer: Self-pay

## 2019-05-13 VITALS — BP 160/98 | HR 71 | Wt 153.4 lb

## 2019-05-13 DIAGNOSIS — E119 Type 2 diabetes mellitus without complications: Secondary | ICD-10-CM | POA: Diagnosis not present

## 2019-05-13 DIAGNOSIS — I10 Essential (primary) hypertension: Secondary | ICD-10-CM

## 2019-05-13 LAB — POCT GLYCOSYLATED HEMOGLOBIN (HGB A1C): HbA1c, POC (controlled diabetic range): 6.2 % (ref 0.0–7.0)

## 2019-05-13 MED ORDER — AMLODIPINE BESYLATE 10 MG PO TABS: 10.0000 mg | ORAL_TABLET | Freq: Every day | ORAL | 3 refills | Status: DC

## 2019-05-13 NOTE — Assessment & Plan Note (Signed)
Reassured patient that although her hemoglobin A1c today is slightly higher at 6.2, it is still well controlled, and we do not need to make any changes to her medications at this time.  Encouraged patient to continue working on practicing healthy eating habits.

## 2019-05-13 NOTE — Patient Instructions (Signed)
It was nice seeing you today [name]!  We will add a medicine called amlodipine to help control your blood pressure.  Please take this once a day at night.  I have given you a 90-day supply.  Please also continue taking the losartan once daily and clonidine twice daily.  I am glad that your urinary symptoms are improving.  Please let me know if this changes.  I would like to see you back in about 3 months.  If you have any questions or concerns, please feel free to call the clinic.   Be well,  Dr. Shan Levans

## 2019-05-13 NOTE — Assessment & Plan Note (Signed)
Discussed that clonidine is not necessarily the safest or most beneficial medication for blood pressure control, but since the patient feels strongly about continuing to take it, we will continue.  Warned patient that this medication can cause rebound hypertension if it is stopped cold [place].  Since her blood pressure is somewhat elevated today, will increase the dose to 2 pills/day, which is what she was taking originally.  We will also add amlodipine 10 mg nightly.  We will see patient back in about 3 months to follow-up on her blood pressure.

## 2019-05-13 NOTE — Progress Notes (Signed)
Subjective:    [name] - 68 y.o. female MRN [id number]  Date of birth: February 03, 1951  CC:  [name] is here for follow-up of hypertension and type 2 diabetes.  HPI:  Hypertension Patient reports that she continues to take losartan 25 mg daily and clonidine 0.1 once daily, although she will often take an extra half pill of the clonidine to help control her anxiety and depression.  She has had no headaches or blurry vision.  She has been feeling well otherwise and has been staying home to avoid exposure to COVID.  Type 2 diabetes Patient continues to take metformin 500 mg twice daily.  She has struggled somewhat with her diet while being home so much, but she is working on portion control.  Health Maintenance:  Health Maintenance Due  Topic Date Due  . FOOT EXAM  05/08/1960  . OPHTHALMOLOGY EXAM  05/08/1960  . TETANUS/TDAP  06/12/2014    -  reports that she quit smoking about 22 months ago. Her smoking use included cigarettes. She has a 10.50 pack-year smoking history. She has never used smokeless tobacco. - Review of Systems: Per HPI. - Past Medical History: Patient Active Problem List   Diagnosis Date Noted  . Mixed urge and stress incontinence 09/07/2018  . Dizziness 09/07/2018  . Vaginal irritation 07/15/2018  . Well controlled type 2 diabetes mellitus ([place]) 02/24/2018  . HSV-2 (herpes simplex virus 2) infection 02/29/2016  . Seborrheic keratoses, inflamed 02/29/2016  . Vulvovaginal candidiasis 05/31/2015  . Itching in the vaginal area 05/17/2015  . Cough 10/02/2014  . Allergic rhinitis 04/25/2014  . Skin lesions 12/08/2013  . Major depressive disorder, recurrent episode, severe, without mention of psychotic behavior 09/08/2012  . Healthcare maintenance 06/13/2012  . Vitamin D deficiency 06/13/2012  . Sleep difficulties 06/13/2012  . Hyperlipidemia - Mixed 05/17/2012  . Migraine 01/20/2012  . COPD (chronic obstructive pulmonary disease) ([place])  03/05/2011  . Bilateral carpal tunnel syndrome 10/27/2010  . BACK PAIN WITH RADICULOPATHY 08/17/2008  . Essential hypertension, benign 11/05/2007  . GERD 11/05/2007   - Medications: reviewed and updated   Objective:   Physical Exam BP (!) 160/98   Pulse 71   Wt 153 lb 6.4 oz (69.6 kg)   SpO2 100%   BMI 28.98 kg/m  Gen: NAD, alert, cooperative with exam, well-appearing CV: RRR, good S1/S2, no murmur, no edema, capillary refill brisk  Resp: CTABL, no wheezes, non-labored Abd: SNTND, BS present, no guarding or organomegaly Psych: good insight, alert and oriented        Assessment & Plan:   Essential hypertension, benign Discussed that clonidine is not necessarily the safest or most beneficial medication for blood pressure control, but since the patient feels strongly about continuing to take it, we will continue.  Warned patient that this medication can cause rebound hypertension if it is stopped cold [place].  Since her blood pressure is somewhat elevated today, will increase the dose to 2 pills/day, which is what she was taking originally.  We will also add amlodipine 10 mg nightly.  We will see patient back in about 3 months to follow-up on her blood pressure.  Well controlled type 2 diabetes mellitus ([place]) Reassured patient that although her hemoglobin A1c today is slightly higher at 6.2, it is still well controlled, and we do not need to make any changes to her medications at this time.  Encouraged patient to continue working on practicing healthy eating habits.    Maia Breslow, M.D.  05/13/2019, 5:09 PM PGY-3, [place]

## 2019-06-14 ENCOUNTER — Encounter: Payer: Self-pay | Admitting: Family Medicine

## 2019-06-18 ENCOUNTER — Other Ambulatory Visit (HOSPITAL_COMMUNITY): Payer: Self-pay | Admitting: Psychiatry

## 2019-06-18 DIAGNOSIS — F330 Major depressive disorder, recurrent, mild: Secondary | ICD-10-CM

## 2019-06-19 ENCOUNTER — Other Ambulatory Visit: Payer: Self-pay | Admitting: Family Medicine

## 2019-06-28 ENCOUNTER — Other Ambulatory Visit: Payer: Self-pay

## 2019-06-28 ENCOUNTER — Ambulatory Visit (INDEPENDENT_AMBULATORY_CARE_PROVIDER_SITE_OTHER): Payer: 59 | Admitting: Psychiatry

## 2019-06-28 ENCOUNTER — Encounter (HOSPITAL_COMMUNITY): Payer: Self-pay | Admitting: Psychiatry

## 2019-06-28 VITALS — Wt 154.0 lb

## 2019-06-28 DIAGNOSIS — F330 Major depressive disorder, recurrent, mild: Secondary | ICD-10-CM

## 2019-06-28 DIAGNOSIS — Z79899 Other long term (current) drug therapy: Secondary | ICD-10-CM | POA: Diagnosis not present

## 2019-06-28 DIAGNOSIS — F411 Generalized anxiety disorder: Secondary | ICD-10-CM

## 2019-06-28 MED ORDER — BUPROPION HCL ER (XL) 300 MG PO TB24: 300.0000 mg | ORAL_TABLET | ORAL | 0 refills | Status: DC

## 2019-06-28 MED ORDER — DIVALPROEX SODIUM 500 MG PO DR TAB: 500.0000 mg | DELAYED_RELEASE_TABLET | Freq: Every day | ORAL | 0 refills | Status: DC

## 2019-06-28 MED ORDER — MIRTAZAPINE 30 MG PO TABS: ORAL_TABLET | ORAL | 0 refills | Status: DC

## 2019-06-28 NOTE — Progress Notes (Signed)
Virtual Visit via Telephone Note  I connected with [name] on 06/28/19 at  2:40 PM EDT by telephone and verified that I am speaking with the correct person using two identifiers.   I discussed the limitations, risks, security and privacy concerns of performing an evaluation and management service by telephone and the availability of in person appointments. I also discussed with the patient that there may be a patient responsible charge related to this service. The patient expressed understanding and agreed to proceed.   History of Present Illness: Patient was evaluated by phone session.  She is taking Depakote, Remeron and Wellbutrin.  She feels her anxiety depression is a stable.  She is reporting no side effects including tremors shakes or any EPS.  She denies any irritability, anger or any crying spells.  Her weight is a stable.  She does exercise every day.  She lives by herself however her daughter lives close by and very involved in her daily life.  She does not want to change medication since it is working.  Recently she had a blood work and her hemoglobin A1c is 6.2 which is a stable.  She has no Depakote level in a while.  We will do blood work next week.  Patient denies drinking or using any illegal substances.     Past Psychiatric History:Reviewed. [facility] [place] [facility]. TriedPaxil,Prozac, vistaril andZoloft. Noh/osuicidal attempt or any psychosis. Recent Results (from the past 2160 hour(s))  POCT glycosylated hemoglobin (Hb A1C)     Status: None   Collection Time: 05/13/19 10:49 AM  Result Value Ref Range   Hemoglobin A1C     HbA1c POC (<> result, manual entry)     HbA1c, POC (prediabetic range)     HbA1c, POC (controlled diabetic range) 6.2 0.0 - 7.0 %     Psychiatric Specialty Exam: Physical Exam  ROS  There were no vitals taken  for this visit.There is no height or weight on file to calculate BMI.  General Appearance: Negative  Eye Contact:  NA  Speech:  Clear and Coherent and Slow  Volume:  Normal  Mood:  Euthymic  Affect:  NA  Thought Process:  Goal Directed  Orientation:  Full (Time, Place, and Person)  Thought Content:  WDL and Logical  Suicidal Thoughts:  No  Homicidal Thoughts:  No  Memory:  Immediate;   Good Recent;   Good Remote;   Good  Judgement:  Good  Insight:  Good  Psychomotor Activity:  NA  Concentration:  Concentration: Good and Attention Span: Good  Recall:  Good  Fund of Knowledge:  Good  Language:  Good  Akathisia:  No  Handed:  Right  AIMS (if indicated):     Assets:  Communication Skills Desire for Improvement Housing Resilience Social Support  ADL's:  Intact  Cognition:  WNL  Sleep:         Assessment and Plan: Major depressive disorder, recurrent.  Generalized anxiety disorder.  Patient is a stable on her current medication.  She does not want to change medication since it is working.  Continue Remeron 30 mg at bedtime, Depakote 500 mg at bedtime and Wellbutrin XL 300 mg daily.  Discussed medication side effects and benefits.  We will do Depakote level, CBC and CMP.  Her hemoglobin A1c recently done which was 6.2.  Patient has to arrange transportation for blood work and she agreed to come next  Wednesday in the office for blood work.  Recommended to call us back if she has any question or any concern.  Follow-up in 3 months.  She is not interested in therapy.  Follow Up Instructions:    I discussed the assessment and treatment plan with the patient. The patient was provided an opportunity to ask questions and all were answered. The patient agreed with the plan and demonstrated an understanding of the instructions.   The patient was advised to call back or seek an in-person evaluation if the symptoms worsen or if the condition fails to improve as anticipated.  I provided  20 minutes of non-face-to-face time during this encounter.   Kathlee Nations, MD

## 2019-06-30 ENCOUNTER — Ambulatory Visit (HOSPITAL_COMMUNITY): Payer: 59 | Admitting: Psychiatry

## 2019-07-06 ENCOUNTER — Other Ambulatory Visit (HOSPITAL_COMMUNITY): Payer: Self-pay

## 2019-07-06 DIAGNOSIS — Z79899 Other long term (current) drug therapy: Secondary | ICD-10-CM

## 2019-07-07 LAB — COMPREHENSIVE METABOLIC PANEL
ALT: 12 IU/L (ref 0–32)
AST: 24 IU/L (ref 0–40)
Albumin/Globulin Ratio: 1.6 (ref 1.2–2.2)
Albumin: 4.4 g/dL (ref 3.8–4.8)
Alkaline Phosphatase: 110 IU/L (ref 39–117)
BUN/Creatinine Ratio: 12 (ref 12–28)
BUN: 13 mg/dL (ref 8–27)
Bilirubin Total: 0.3 mg/dL (ref 0.0–1.2)
CO2: 21 mmol/L (ref 20–29)
Calcium: 9.8 mg/dL (ref 8.7–10.3)
Chloride: 105 mmol/L (ref 96–106)
Creatinine, Ser: 1.07 mg/dL — ABNORMAL HIGH (ref 0.57–1.00)
GFR calc Af Amer: 61 mL/min/{1.73_m2} (ref >59)
GFR calc non Af Amer: 53 mL/min/{1.73_m2} — ABNORMAL LOW (ref >59)
Globulin, Total: 2.7 g/dL (ref 1.5–4.5)
Glucose: 110 mg/dL — ABNORMAL HIGH (ref 65–99)
Potassium: 4.6 mmol/L (ref 3.5–5.2)
Sodium: 146 mmol/L — ABNORMAL HIGH (ref 134–144)
Total Protein: 7.1 g/dL (ref 6.0–8.5)

## 2019-07-07 LAB — VALPROIC ACID LEVEL: Valproic Acid Lvl: 50 ug/mL (ref 50–100)

## 2019-07-07 LAB — CBC WITH DIFFERENTIAL/PLATELET
Basophils Absolute: 0.1 10*3/uL (ref 0.0–0.2)
Basos: 1 %
EOS (ABSOLUTE): 0.3 10*3/uL (ref 0.0–0.4)
Eos: 4 %
Hematocrit: 39.8 % (ref 34.0–46.6)
Hemoglobin: 12.7 g/dL (ref 11.1–15.9)
Immature Grans (Abs): 0 10*3/uL (ref 0.0–0.1)
Immature Granulocytes: 0 %
Lymphocytes Absolute: 4.6 10*3/uL — ABNORMAL HIGH (ref 0.7–3.1)
Lymphs: 59 %
MCH: 27.7 pg (ref 26.6–33.0)
MCHC: 31.9 g/dL (ref 31.5–35.7)
MCV: 87 fL (ref 79–97)
Monocytes Absolute: 0.5 10*3/uL (ref 0.1–0.9)
Monocytes: 6 %
Neutrophils Absolute: 2.3 10*3/uL (ref 1.4–7.0)
Neutrophils: 30 %
Platelets: 357 10*3/uL (ref 150–450)
RBC: 4.58 x10E6/uL (ref 3.77–5.28)
RDW: 12.9 % (ref 11.7–15.4)
WBC: 7.7 10*3/uL (ref 3.4–10.8)

## 2019-07-25 ENCOUNTER — Other Ambulatory Visit (HOSPITAL_COMMUNITY): Payer: Self-pay | Admitting: Psychiatry

## 2019-07-25 DIAGNOSIS — F330 Major depressive disorder, recurrent, mild: Secondary | ICD-10-CM

## 2019-08-02 ENCOUNTER — Encounter: Payer: Self-pay | Admitting: Internal Medicine

## 2019-08-05 ENCOUNTER — Encounter: Payer: Self-pay | Admitting: Family Medicine

## 2019-08-05 ENCOUNTER — Ambulatory Visit (INDEPENDENT_AMBULATORY_CARE_PROVIDER_SITE_OTHER): Payer: 59 | Admitting: Family Medicine

## 2019-08-05 ENCOUNTER — Other Ambulatory Visit: Payer: Self-pay

## 2019-08-05 VITALS — BP 130/72 | HR 86 | Ht 61.0 in | Wt 155.0 lb

## 2019-08-05 DIAGNOSIS — B009 Herpesviral infection, unspecified: Secondary | ICD-10-CM

## 2019-08-05 DIAGNOSIS — I10 Essential (primary) hypertension: Secondary | ICD-10-CM

## 2019-08-05 DIAGNOSIS — Z23 Encounter for immunization: Secondary | ICD-10-CM

## 2019-08-05 DIAGNOSIS — E119 Type 2 diabetes mellitus without complications: Secondary | ICD-10-CM | POA: Diagnosis not present

## 2019-08-05 LAB — POCT GLYCOSYLATED HEMOGLOBIN (HGB A1C): HbA1c, POC (controlled diabetic range): 6.2 % (ref 0.0–7.0)

## 2019-08-05 MED ORDER — CLONIDINE HCL 0.2 MG PO TABS: 0.2000 mg | ORAL_TABLET | Freq: Two times a day (BID) | ORAL | 11 refills | Status: DC

## 2019-08-05 MED ORDER — METFORMIN HCL 500 MG PO TABS: 500.0000 mg | ORAL_TABLET | Freq: Two times a day (BID) | ORAL | 3 refills | Status: DC

## 2019-08-05 MED ORDER — VALACYCLOVIR HCL 500 MG PO TABS: 500.0000 mg | ORAL_TABLET | Freq: Two times a day (BID) | ORAL | 3 refills | Status: DC

## 2019-08-05 NOTE — Assessment & Plan Note (Signed)
Will refill Valtrex for continued suppression today.

## 2019-08-05 NOTE — Assessment & Plan Note (Signed)
Blood pressure is reassuring today.  We will continue amlodipine and losartan at the current doses and increase her clonidine to 0.2 mg twice daily.  We will follow-up in 2 to 3 months.

## 2019-08-05 NOTE — Progress Notes (Signed)
Subjective:    [name] - 68 y.o. female MRN [id number]  Date of birth: 06/11/1951  CC:  [name] is here for follow up of blood pressure and type 2 diabetes.  HPI: Hypertension Patient reports that she has been doing well and has no complaints today.  A home health aide has come out and taken her blood pressure a few times, and these values have averaged around 150/90.  She continues to take losartan, amlodipine, and clonidine as prescribed.  She would like to continue taking the clonidine since it helps her sleep at night.  She is aware that it can cause rebound hypertension when stopped cold [place].  She would also like a refill of her Valtrex today.  Diabetes Continues to reduce her carbohydrate intake and walk daily.  Denies hypoglycemic symptoms.  Continues to take metformin 500 mg twice daily.  Saw the ophthalmologist in February of this year.  Health Maintenance:  -Patient would like flu shot today Health Maintenance Due  Topic Date Due  . FOOT EXAM  05/08/1960  . OPHTHALMOLOGY EXAM  05/08/1960  . TETANUS/TDAP  06/12/2014    -  reports that she quit smoking about 2 years ago. Her smoking use included cigarettes. She has a 10.50 pack-year smoking history. She has never used smokeless tobacco. - Review of Systems: Per HPI. - Past Medical History: Patient Active Problem List   Diagnosis Date Noted  . Mixed urge and stress incontinence 09/07/2018  . Dizziness 09/07/2018  . Vaginal irritation 07/15/2018  . Well controlled type 2 diabetes mellitus ([place]) 02/24/2018  . HSV-2 (herpes simplex virus 2) infection 02/29/2016  . Seborrheic keratoses, inflamed 02/29/2016  . Vulvovaginal candidiasis 05/31/2015  . Itching in the vaginal area 05/17/2015  . Cough 10/02/2014  . Allergic rhinitis 04/25/2014  . Skin lesions 12/08/2013  . Major depressive disorder, recurrent episode, severe, without mention of psychotic behavior 09/08/2012  . Healthcare maintenance  06/13/2012  . Vitamin D deficiency 06/13/2012  . Sleep difficulties 06/13/2012  . Hyperlipidemia - Mixed 05/17/2012  . Migraine 01/20/2012  . COPD (chronic obstructive pulmonary disease) ([place]) 03/05/2011  . Bilateral carpal tunnel syndrome 10/27/2010  . History of tobacco abuse 08/18/2008  . [name] PAIN WITH RADICULOPATHY 08/17/2008  . Essential hypertension, benign 11/05/2007  . GERD 11/05/2007   - Medications: reviewed and updated   Objective:   Physical Exam BP 130/72   Pulse 86   Ht 5\' 1"  (1.549 m)   Wt 155 lb (70.3 kg)   SpO2 99%   BMI 29.29 kg/m  Gen: NAD, alert, cooperative with exam, well-appearing CV: RRR, good S1/S2, no murmur Resp: CTABL, no wheezes, non-labored Skin: no rashes, normal turgor  Neuro: no gross deficits.  Psych: good insight, alert and oriented     Assessment & Plan:   Essential hypertension, benign Blood pressure is reassuring today.  We will continue amlodipine and losartan at the current doses and increase her clonidine to 0.2 mg twice daily.  We will follow-up in 2 to 3 months.  HSV-2 (herpes simplex virus 2) infection Will refill Valtrex for continued suppression today.  Well controlled type 2 diabetes mellitus ([place]) Hemoglobin A1c is 6.2, so diabetes continues to be well controlled.  Will continue metformin 500 mg twice daily, although patient does not have to continue this medication in the future if she wishes to discontinue it.  Congratulated patient on her continued good control.  Asked that she have her ophthalmologist fax [place] the office note  from her visit earlier this year.  We will perform a foot exam at her next office visit.    Maia Breslow, M.D. 08/05/2019, 5:12 PM PGY-3, [place]

## 2019-08-05 NOTE — Assessment & Plan Note (Signed)
Hemoglobin A1c is 6.2, so diabetes continues to be well controlled.  Will continue metformin 500 mg twice daily, although patient does not have to continue this medication in the future if she wishes to discontinue it.  Congratulated patient on her continued good control.  Asked that she have her ophthalmologist fax [place] the office note from her visit earlier this year.  We will perform a foot exam at her next office visit.

## 2019-08-18 ENCOUNTER — Telehealth: Payer: Self-pay | Admitting: Family Medicine

## 2019-08-18 NOTE — Telephone Encounter (Signed)
Pt is calling and would like for Dr. Shan Levans to call her as soon as possible. She has an appointment scheduled for 11/17 but was told by her home health nurse it is urgent.   Pt says that her nurse came to her home for "some testing" (pt did not know the name of test). Her nurse faxed paper work with results and notes from test last week. She was checking to see if Dr. Shan Levans has received this form.   She did not know the name of form or where it was coming from. I checked mailbox and did not see form.   Please call pt to discuss above.  The best call back number is [phone]

## 2019-08-19 NOTE — Telephone Encounter (Signed)
Attempted to return patient's call but there was no answer or voicemail.  I have not received any information faxed to me regarding the test that she is referring to.

## 2019-08-27 ENCOUNTER — Other Ambulatory Visit: Payer: Self-pay | Admitting: Family Medicine

## 2019-09-13 ENCOUNTER — Ambulatory Visit (INDEPENDENT_AMBULATORY_CARE_PROVIDER_SITE_OTHER): Payer: 59 | Admitting: Family Medicine

## 2019-09-13 ENCOUNTER — Other Ambulatory Visit: Payer: Self-pay

## 2019-09-13 ENCOUNTER — Encounter: Payer: Self-pay | Admitting: Family Medicine

## 2019-09-13 VITALS — BP 122/70 | HR 62

## 2019-09-13 DIAGNOSIS — B070 Plantar wart: Secondary | ICD-10-CM | POA: Diagnosis not present

## 2019-09-13 MED ORDER — CLONIDINE HCL 0.2 MG PO TABS: 0.2000 mg | ORAL_TABLET | Freq: Two times a day (BID) | ORAL | 3 refills | Status: DC

## 2019-09-13 NOTE — Progress Notes (Signed)
Subjective:    [name] - 68 y.o. female MRN [id number]  Date of birth: 01-Nov-1950  CC:  [name] is here for follow-up of her annual wellness visit conducted by her insurance company.  She is also concerned about a lesion on the plantar surface of her right foot.  HPI: Follow-up of test results Patient reports that a test was done similar to pulse oximetry on her toes during her annual wellness visit conducted at her home by her insurance company, and she was told that she needs to follow-up right away with a physician because this value was abnormal.  She says that they told her that they would be sending me the results of the test they performed.  Lesion on right foot Patient reports a longstanding lesion that will intermittently become more painful when she puts weight on the area that is located just distal to her right heel on the plantar surface of this foot.  It has not grown bigger in size and she has no other lesions similar to this.  It is nonbleeding.  She would like to have a referral to podiatry to further assess this lesion and to trim her toenails.  Health Maintenance:  Health Maintenance Due  Topic Date Due  . FOOT EXAM  05/08/1960  . TETANUS/TDAP  06/12/2014  . COLONOSCOPY  08/08/2019    -  reports that she quit smoking about 2 years ago. Her smoking use included cigarettes. She has a 10.50 pack-year smoking history. She has never used smokeless tobacco. - Review of Systems: Per HPI. - Past Medical History: Patient Active Problem List   Diagnosis Date Noted  . Plantar wart of right foot 09/14/2019  . Mixed urge and stress incontinence 09/07/2018  . Dizziness 09/07/2018  . Vaginal irritation 07/15/2018  . Well controlled type 2 diabetes mellitus ([place]) 02/24/2018  . HSV-2 (herpes simplex virus 2) infection 02/29/2016  . Seborrheic keratoses, inflamed 02/29/2016  . Vulvovaginal candidiasis 05/31/2015  . Itching in the vaginal area  05/17/2015  . Cough 10/02/2014  . Allergic rhinitis 04/25/2014  . Skin lesions 12/08/2013  . Major depressive disorder, recurrent episode, severe, without mention of psychotic behavior 09/08/2012  . Healthcare maintenance 06/13/2012  . Vitamin D deficiency 06/13/2012  . Sleep difficulties 06/13/2012  . Hyperlipidemia - Mixed 05/17/2012  . Migraine 01/20/2012  . COPD (chronic obstructive pulmonary disease) ([place]) 03/05/2011  . Bilateral carpal tunnel syndrome 10/27/2010  . History of tobacco abuse 08/18/2008  . BACK PAIN WITH RADICULOPATHY 08/17/2008  . Essential hypertension, benign 11/05/2007  . GERD 11/05/2007   - Medications: reviewed and updated   Objective:   Physical Exam BP 122/70   Pulse 62   SpO2 97%  Gen: NAD, alert, cooperative with exam, well-appearing CV: RRR, good S1/S2, no murmur Resp: CTABL, no wheezes, non-labored Extremities: Bilateral feet without skin breakdown, 2+ PT and DP pulses bilaterally, toenails slightly long but not dystrophic.  On right foot, round, rough lesion that is mildly tender to palpation located just distal to the plantar surface of the calcaneus. Psych: good insight, alert and oriented  Diabetic Foot Exam - Simple   Simple Foot Form Diabetic Foot exam was performed with the following findings: Yes 09/13/2019  4:24 PM  Visual Inspection No deformities, no ulcerations, no other skin breakdown bilaterally: Yes Sensation Testing Intact to touch and monofilament testing bilaterally: Yes Pulse Check Posterior Tibialis and Dorsalis pulse intact bilaterally: Yes Comments  Assessment & Plan:   Plantar wart of right foot Reassured patient that her lower extremities are warm and well-perfused with good pulses and no skin breakdown, so I am not concerned that she has arterial insufficiency.  I will certainly look at the test results from her annual wellness visit whenever I receive them, however.  Patient likely has a plantar wart on  her right foot without callus formation.  Placed referral to podiatry per patient request for both this issue and for trimming of her toenails, although this can likely be treated with salicylic acid pads, which were described to the patient.    [name], M.D. 09/14/2019, 11:30 AM PGY-3, [place]

## 2019-09-13 NOTE — Patient Instructions (Addendum)
It was nice seeing you today [name]!  Our fax number is [phone].  Your foot exam was completely normal today, but I am interested in the results of the test that was performed at your annual wellness visit.  I have changed her clonidine prescription to a 90-day prescription with 3 refills.  Your blood pressure was normal today.  Since your diabetes is in such good control, you can come back in 6 months unless you need to see me earlier.  Stay safe!  If you have any questions or concerns, please feel free to call the clinic.   Be well,  Dr. Shan Levans

## 2019-09-14 DIAGNOSIS — B070 Plantar wart: Secondary | ICD-10-CM

## 2019-09-14 HISTORY — DX: Plantar wart: B07.0

## 2019-09-14 NOTE — Assessment & Plan Note (Signed)
Reassured patient that her lower extremities are warm and well-perfused with good pulses and no skin breakdown, so I am not concerned that she has arterial insufficiency.  I will certainly look at the test results from her annual wellness visit whenever I receive them, however.  Patient likely has a plantar wart on her right foot without callus formation.  Placed referral to podiatry per patient request for both this issue and for trimming of her toenails, although this can likely be treated with salicylic acid pads, which were described to the patient.

## 2019-09-27 ENCOUNTER — Other Ambulatory Visit: Payer: Self-pay

## 2019-09-27 ENCOUNTER — Encounter (HOSPITAL_COMMUNITY): Payer: 59 | Admitting: Psychiatry

## 2019-09-27 ENCOUNTER — Ambulatory Visit: Payer: 59 | Admitting: Sports Medicine

## 2019-09-27 NOTE — Progress Notes (Signed)
No show

## 2019-10-03 ENCOUNTER — Telehealth (HOSPITAL_COMMUNITY): Payer: Self-pay

## 2019-10-03 NOTE — Telephone Encounter (Signed)
Needs appointment for refill

## 2019-10-03 NOTE — Telephone Encounter (Signed)
Medication management - Faxes received from pt's [facility] for refills of her past prescribed Depakote ER and Remeron, last ordered 06/28/19 for 90 days and pt no showed 09/27/19.  No appt. currenty scheduled.

## 2019-10-05 ENCOUNTER — Telehealth (HOSPITAL_COMMUNITY): Payer: Self-pay

## 2019-10-05 NOTE — Telephone Encounter (Signed)
Received a fax from [facility] on [address] requesting a refill on patients Mirtazapine 30mg  and her Depakote 500mg . She was last seen 06/28/19 and has not scheduled her 3 mnth followup appt. Do you want me to refill this time w/no future refills until appointment is made or call her to make the appointment first? Please review and advise. Thank you.

## 2019-10-06 NOTE — Telephone Encounter (Signed)
Can you call her to find out why she missed appointment and provided a 30 days supply if she wants to continue treatment here? She usually compliant with follow up. Thanx

## 2019-10-07 NOTE — Telephone Encounter (Signed)
Called patient to speak with her regarding her missed appointment and her medication but she didn't pick up so I left a voicemail message to call us back

## 2019-10-07 NOTE — Telephone Encounter (Signed)
ok 

## 2019-10-13 NOTE — Telephone Encounter (Signed)
I have spoken with the pharmacy (as well as trying to reach the patient) and they are also trying to reach out to her to reschedule her appointment and regarding her medication

## 2019-10-16 ENCOUNTER — Other Ambulatory Visit: Payer: Self-pay | Admitting: Family Medicine

## 2019-11-01 ENCOUNTER — Other Ambulatory Visit: Payer: Self-pay

## 2019-11-01 ENCOUNTER — Ambulatory Visit (INDEPENDENT_AMBULATORY_CARE_PROVIDER_SITE_OTHER): Payer: 59 | Admitting: Sports Medicine

## 2019-11-01 ENCOUNTER — Encounter: Payer: Self-pay | Admitting: Sports Medicine

## 2019-11-01 DIAGNOSIS — M79674 Pain in right toe(s): Secondary | ICD-10-CM | POA: Diagnosis not present

## 2019-11-01 DIAGNOSIS — M79675 Pain in left toe(s): Secondary | ICD-10-CM | POA: Diagnosis not present

## 2019-11-01 DIAGNOSIS — Q828 Other specified congenital malformations of skin: Secondary | ICD-10-CM

## 2019-11-01 DIAGNOSIS — E119 Type 2 diabetes mellitus without complications: Secondary | ICD-10-CM | POA: Diagnosis not present

## 2019-11-01 DIAGNOSIS — B351 Tinea unguium: Secondary | ICD-10-CM | POA: Diagnosis not present

## 2019-11-01 NOTE — Progress Notes (Signed)
Subjective: [name] is a 69 y.o. female patient with history of diabetes who presents to office today complaining of long,mildly painful nails while ambulating in shoes; unable to trim. Patient states that the glucose reading this morning was not recorded. Patient reports that she also has a hard area on bottom of right heel and dark spots on bottom of feet. Denies any other pedal complaints at this time.  Review of Systems  All other systems reviewed and are negative.    Patient Active Problem List   Diagnosis Date Noted  . Plantar wart of right foot 09/14/2019  . Mixed urge and stress incontinence 09/07/2018  . Dizziness 09/07/2018  . Vaginal irritation 07/15/2018  . Well controlled type 2 diabetes mellitus ([place]) 02/24/2018  . HSV-2 (herpes simplex virus 2) infection 02/29/2016  . Seborrheic keratoses, inflamed 02/29/2016  . Vulvovaginal candidiasis 05/31/2015  . Itching in the vaginal area 05/17/2015  . Cough 10/02/2014  . Allergic rhinitis 04/25/2014  . Skin lesions 12/08/2013  . Major depressive disorder, recurrent episode, severe, without mention of psychotic behavior 09/08/2012  . Healthcare maintenance 06/13/2012  . Vitamin D deficiency 06/13/2012  . Sleep difficulties 06/13/2012  . Hyperlipidemia - Mixed 05/17/2012  . Migraine 01/20/2012  . COPD (chronic obstructive pulmonary disease) ([place]) 03/05/2011  . Bilateral carpal tunnel syndrome 10/27/2010  . History of tobacco abuse 08/18/2008  . BACK PAIN WITH RADICULOPATHY 08/17/2008  . Essential hypertension, benign 11/05/2007  . GERD 11/05/2007   Current Outpatient Medications on File Prior to Visit  Medication Sig Dispense Refill  . albuterol (PROAIR HFA) 108 (90 Base) MCG/ACT inhaler Inhale 2 puffs as directed into the lungs. 1 Inhaler 3  . amLODipine (NORVASC) 10 MG tablet Take 1 tablet (10 mg total) by mouth at bedtime. 90 tablet 3  . buPROPion (WELLBUTRIN XL) 300 MG 24 hr tablet Take 1 tablet (300 mg  total) by mouth every morning. 90 tablet 0  . cetirizine (ZYRTEC) 10 MG tablet Take 1 tablet (10 mg total) by mouth daily. 30 tablet 11  . cloNIDine (CATAPRES) 0.2 MG tablet Take 1 tablet (0.2 mg total) by mouth 2 (two) times daily. 180 tablet 3  . clotrimazole (LOTRIMIN) 1 % cream Apply 1 application topically 2 (two) times daily. 60 g 0  . divalproex (DEPAKOTE) 500 MG DR tablet Take 1 tablet (500 mg total) by mouth at bedtime. 90 tablet 0  . fluconazole (DIFLUCAN) 150 MG tablet TAKE 1 TABLET(150 MG) BY MOUTH 1 TIME 1 tablet 0  . fluticasone (FLONASE) 50 MCG/ACT nasal spray Place 2 sprays into both nostrils daily. 16 g 6  . hydrochlorothiazide (HYDRODIURIL) 12.5 MG tablet Take 1 tablet (12.5 mg total) daily by mouth. 30 tablet 5  . lidocaine (XYLOCAINE) 2 % solution Use as directed 20 mLs as needed in the mouth or throat for mouth pain. 100 mL 0  . linaclotide (LINZESS) 290 MCG CAPS capsule TAKE 1 CAPSULE BY MOUTH DAILY BEFORE BREAKFAST 90 capsule 3  . losartan (COZAAR) 25 MG tablet TAKE 1 TABLET(25 MG) BY MOUTH DAILY 30 tablet 11  . metFORMIN (GLUCOPHAGE) 500 MG tablet Take 1 tablet (500 mg total) by mouth 2 (two) times daily with a meal. 180 tablet 3  . metFORMIN (GLUCOPHAGE-XR) 500 MG 24 hr tablet Take 1 tablet (500 mg total) by mouth 2 (two) times a day. 180 tablet 3  . mirtazapine (REMERON) 30 MG tablet TAKE 1 TABLET(30 MG) BY MOUTH AT BEDTIME 90 tablet 0  . pantoprazole (PROTONIX)  40 MG tablet Take 1 tablet (40 mg total) by mouth daily. 90 tablet 3  . potassium chloride SA (K-DUR,KLOR-CON) 20 MEQ tablet Take 1 tablet (20 mEq total) daily by mouth. 30 tablet 0  . propranolol (INDERAL) 40 MG tablet Take 1 tablet (40 mg total) by mouth 2 (two) times daily. 60 tablet 11  . valACYclovir (VALTREX) 500 MG tablet Take 1 tablet (500 mg total) by mouth 2 (two) times daily. x5 days as needed for flares 30 tablet 3   No current facility-administered medications on file prior to visit.   Allergies   Allergen Reactions  . Aspirin Other (See Comments)    Dr told pt not to take asa d/t bleeding ulcer in 1985.  [name] Penicillins Nausea Only    Tablets only.  Denies any airway involvement.    . Latex Rash    Relieved with over the counter medications.    Recent Results (from the past 2160 hour(s))  HgB A1c     Status: None   Collection Time: 08/05/19  4:02 PM  Result Value Ref Range   Hemoglobin A1C     HbA1c POC (<> result, manual entry)     HbA1c, POC (prediabetic range)     HbA1c, POC (controlled diabetic range) 6.2 0.0 - 7.0 %    Objective: General: Patient is awake, alert, and oriented x 3 and in no acute distress.  Integument: Skin is warm, dry and supple bilateral. Nails are tender, long, thickened and dystrophic with subungual debris, consistent with onychomycosis, 1-5 bilateral. No signs of infection. At right heel there is a keratotic lesion with nucleated core consistent with porokeratosis, no signs of infection, Melanin spots plantar feet which are normal, Remaining integument unremarkable.  Vasculature:  Dorsalis Pedis pulse 1/4 bilateral. Posterior Tibial pulse  1/4 bilateral. Capillary fill time <3 sec 1-5 bilateral. Positive hair growth to the level of the digits. Temperature gradient within normal limits. No varicosities present bilateral. No edema present bilateral.   Neurology: The patient has intact sensation measured with a 5.07/10g Semmes Weinstein Monofilament at all pedal sites bilateral . Vibratory sensation intact bilateral with tuning fork. No Babinski sign present bilateral.   Musculoskeletal: No symptomatic pedal deformities noted bilateral. Muscular strength 5/5 in all lower extremity muscular groups bilateral without pain on range of motion. No tenderness with calf compression bilateral.  Assessment and Plan: Problem List Items Addressed This Visit    None    Visit Diagnoses    Pain due to onychomycosis of toenails of both feet    -  Primary    Diabetes mellitus without complication (HCC)       Porokeratosis          -Examined patient. -Discussed and educated patient on diabetic foot care, especially with  regards to the vascular, neurological and musculoskeletal systems.  -Stressed the importance of good glycemic control and the detriment of not  controlling glucose levels in relation to the foot. -Mechanically debrided all nails 1-5 bilateral using sterile nail nipper and filed with dremel without incident  -Mechanically debrided at no charge keratosis at right heel using chisel blade without incident -Advised patient that melanin spots on bottom of feet are normal -Advised daily emollients; foot miracle sample given -Answered all patient questions -Patient to return  in 3 months for at risk foot care -Patient advised to call the office if any problems or questions arise in the meantime.  Landis Martins, DPM

## 2019-11-16 ENCOUNTER — Ambulatory Visit: Payer: 59 | Admitting: Family Medicine

## 2019-11-18 ENCOUNTER — Ambulatory Visit: Payer: 59 | Attending: Internal Medicine

## 2019-11-18 DIAGNOSIS — Z20822 Contact with and (suspected) exposure to covid-19: Secondary | ICD-10-CM

## 2019-11-19 LAB — NOVEL CORONAVIRUS, NAA: SARS-CoV-2, NAA: NOT DETECTED

## 2019-11-20 ENCOUNTER — Telehealth: Payer: Self-pay

## 2019-11-20 NOTE — Telephone Encounter (Signed)
Patient says she's returning a call about her covid test results. Results given as noted not detected, she verbalized understanding.

## 2019-11-22 ENCOUNTER — Ambulatory Visit (INDEPENDENT_AMBULATORY_CARE_PROVIDER_SITE_OTHER): Payer: 59 | Admitting: Family Medicine

## 2019-11-22 ENCOUNTER — Other Ambulatory Visit: Payer: Self-pay

## 2019-11-22 ENCOUNTER — Encounter: Payer: Self-pay | Admitting: Family Medicine

## 2019-11-22 VITALS — BP 138/70 | HR 58 | Temp 97.9°F | Wt 150.0 lb

## 2019-11-22 DIAGNOSIS — L03311 Cellulitis of abdominal wall: Secondary | ICD-10-CM

## 2019-11-22 DIAGNOSIS — L240 Irritant contact dermatitis due to detergents: Secondary | ICD-10-CM | POA: Diagnosis not present

## 2019-11-22 DIAGNOSIS — B373 Candidiasis of vulva and vagina: Secondary | ICD-10-CM

## 2019-11-22 DIAGNOSIS — B3731 Acute candidiasis of vulva and vagina: Secondary | ICD-10-CM

## 2019-11-22 MED ORDER — FLUCONAZOLE 150 MG PO TABS: 150.0000 mg | ORAL_TABLET | Freq: Once | ORAL | 0 refills | Status: AC

## 2019-11-22 MED ORDER — TRIAMCINOLONE ACETONIDE 0.5 % EX OINT: 1.0000 "application " | TOPICAL_OINTMENT | Freq: Two times a day (BID) | CUTANEOUS | 3 refills | Status: DC

## 2019-11-22 MED ORDER — DOXYCYCLINE HYCLATE 100 MG PO TABS: 100.0000 mg | ORAL_TABLET | Freq: Two times a day (BID) | ORAL | 0 refills | Status: DC

## 2019-11-22 NOTE — Patient Instructions (Addendum)
It was nice seeing you today Ms. Halterman!  For the rash on her hands, I am sending in a steroid cream that should reduce the pain and itching.  Please make sure to wear long gloves when you wash her dishes to prevent further irritation.  Please also moisturize your hands daily.  For the area on your abdomen, I am sending an oral antibiotic to make sure that you do not have an infection there.  Please take this as prescribed on the medicine bottle.  If you develop fever, increased pain, or have no improvement after 1 week, please let me know.  I had like to see you back in about 3 months or earlier if needed.  If you have any questions or concerns, please feel free to call the clinic.   Be well,  Dr. Shan Levans

## 2019-11-22 NOTE — Assessment & Plan Note (Signed)
Rash on forearms and hands is likely due to new dish detergent.  Will prescribe triamcinolone for reduction of inflammation and advised patient to moisturize and wear gloves to the elbow when washing dishes.

## 2019-11-22 NOTE — Assessment & Plan Note (Signed)
Although frank cellulitis does not seem likely on visual inspection, patient is exquisitely tender to palpation and has had symptoms for 3 weeks, so will prescribe a 7-day course of doxycycline 100 mg twice daily for possible infection.  There may also be an anxiety component since pain was out of proportion to exam.

## 2019-11-22 NOTE — Assessment & Plan Note (Signed)
Will prescribe fluconazole since patient often gets vaginal candidiasis with oral antibiotics.  Patient knows not to take this unless she experiences symptoms.

## 2019-11-22 NOTE — Progress Notes (Signed)
Subjective:    [name] - 69 y.o. female MRN [id number]  Date of birth: 29-Apr-1951  CC:  [name] is here for a rash and a lesion on the abdomen.  HPI: Rash on upper extremities Duration of one week First noticed on R arm, now on hands Itchy and painful Has occurred before but never this bad  Confined to arms and hands this time, but had a similar lesion on her leg Feeling well otherwise No known irritants A dermatologist gave her a cream that she used on the previous lesion about 4 years ago that seemed to help Daughter brought new dish detergent that she has been using without gloves recently  Lesion on abdomen Pulled mole off in her sleep three weeks ago Area was tender afterwards Abdomen is sore and swollen Had redness surrounding the area which has improved Waistband of pants fitting more tightly than normal Cleans the area with iodine Significant pain especially if there is any pressure to the area  Health Maintenance:  Health Maintenance Due  Topic Date Due  . TETANUS/TDAP  06/12/2014  . COLONOSCOPY  08/08/2019    -  reports that she quit smoking about 2 years ago. Her smoking use included cigarettes. She has a 10.50 pack-year smoking history. She has never used smokeless tobacco. - Review of Systems: Per HPI. - Past Medical History: Patient Active Problem List   Diagnosis Date Noted  . Irritant contact dermatitis due to detergent 11/22/2019  . Cellulitis of abdominal wall 11/22/2019  . Plantar wart of right foot 09/14/2019  . Mixed urge and stress incontinence 09/07/2018  . Dizziness 09/07/2018  . Vaginal irritation 07/15/2018  . Well controlled type 2 diabetes mellitus ([place]) 02/24/2018  . HSV-2 (herpes simplex virus 2) infection 02/29/2016  . Seborrheic keratoses, inflamed 02/29/2016  . Vulvovaginal candidiasis 05/31/2015  . Itching in the vaginal area 05/17/2015  . Cough 10/02/2014  . Allergic rhinitis 04/25/2014  . Skin  lesions 12/08/2013  . Major depressive disorder, recurrent episode, severe, without mention of psychotic behavior 09/08/2012  . Healthcare maintenance 06/13/2012  . Vitamin D deficiency 06/13/2012  . Sleep difficulties 06/13/2012  . Hyperlipidemia - Mixed 05/17/2012  . Migraine 01/20/2012  . COPD (chronic obstructive pulmonary disease) ([place]) 03/05/2011  . Bilateral carpal tunnel syndrome 10/27/2010  . History of tobacco abuse 08/18/2008  . BACK PAIN WITH RADICULOPATHY 08/17/2008  . Essential hypertension, benign 11/05/2007  . GERD 11/05/2007   - Medications: reviewed and updated   Objective:   Physical Exam BP 138/70   Pulse (!) 58   Temp 97.9 F (36.6 C) (Oral)   Wt 150 lb (68 kg)   SpO2 99%   BMI 28.34 kg/m  Gen: NAD, alert, cooperative with exam, well-appearing CV: RRR, good S1/S2, no murmur Resp: CTABL, no wheezes, non-labored Abd: Lesion with scab and mild erythema.  Entire abdomen is exquisitely tender to palpation, especially around lesion.  No fluctuance. Skin: Several mildly erythematous, scabbed lesions on bilateral hands and forearms, no signs of surrounding infection     Assessment & Plan:   Cellulitis of abdominal wall Although frank cellulitis does not seem likely on visual inspection, patient is exquisitely tender to palpation and has had symptoms for 3 weeks, so will prescribe a 7-day course of doxycycline 100 mg twice daily for possible infection.  There may also be an anxiety component since pain was out of proportion to exam.  Vulvovaginal candidiasis Will prescribe fluconazole since patient often gets vaginal candidiasis  with oral antibiotics.  Patient knows not to take this unless she experiences symptoms.  Irritant contact dermatitis due to detergent Rash on forearms and hands is likely due to new dish detergent.  Will prescribe triamcinolone for reduction of inflammation and advised patient to moisturize and wear gloves to the elbow when washing  dishes.    Maia Breslow, M.D. 11/22/2019, 5:09 PM PGY-3, [place]

## 2019-12-15 ENCOUNTER — Other Ambulatory Visit: Payer: Self-pay | Admitting: Internal Medicine

## 2020-01-03 ENCOUNTER — Other Ambulatory Visit: Payer: Self-pay

## 2020-01-03 DIAGNOSIS — J302 Other seasonal allergic rhinitis: Secondary | ICD-10-CM

## 2020-01-03 MED ORDER — FLUTICASONE PROPIONATE 50 MCG/ACT NA SUSP: 2.0000 | Freq: Every day | NASAL | 6 refills | Status: DC

## 2020-01-10 ENCOUNTER — Ambulatory Visit (INDEPENDENT_AMBULATORY_CARE_PROVIDER_SITE_OTHER): Payer: 59 | Admitting: Sports Medicine

## 2020-01-10 ENCOUNTER — Encounter: Payer: Self-pay | Admitting: Sports Medicine

## 2020-01-10 ENCOUNTER — Other Ambulatory Visit: Payer: Self-pay

## 2020-01-10 VITALS — Temp 95.5°F

## 2020-01-10 DIAGNOSIS — B379 Candidiasis, unspecified: Secondary | ICD-10-CM

## 2020-01-10 DIAGNOSIS — B351 Tinea unguium: Secondary | ICD-10-CM

## 2020-01-10 DIAGNOSIS — Q828 Other specified congenital malformations of skin: Secondary | ICD-10-CM

## 2020-01-10 DIAGNOSIS — E119 Type 2 diabetes mellitus without complications: Secondary | ICD-10-CM | POA: Diagnosis not present

## 2020-01-10 DIAGNOSIS — M79675 Pain in left toe(s): Secondary | ICD-10-CM | POA: Diagnosis not present

## 2020-01-10 DIAGNOSIS — M79674 Pain in right toe(s): Secondary | ICD-10-CM

## 2020-01-10 NOTE — Progress Notes (Signed)
Subjective: [name] is a 69 y.o. female patient with history of diabetes who presents to office today complaining of long,mildly painful nails and callus to heels/right while ambulating in shoes; unable to trim. Patient states that the glucose reading this morning was not recorded. Patient reports that the cream has helped her heels right. Denies any other pedal complaints at this time.  Patient Active Problem List   Diagnosis Date Noted  . Irritant contact dermatitis due to detergent 11/22/2019  . Cellulitis of abdominal wall 11/22/2019  . Plantar wart of right foot 09/14/2019  . Mixed urge and stress incontinence 09/07/2018  . Dizziness 09/07/2018  . Vaginal irritation 07/15/2018  . Well controlled type 2 diabetes mellitus ([place]) 02/24/2018  . HSV-2 (herpes simplex virus 2) infection 02/29/2016  . Seborrheic keratoses, inflamed 02/29/2016  . Vulvovaginal candidiasis 05/31/2015  . Itching in the vaginal area 05/17/2015  . Cough 10/02/2014  . Allergic rhinitis 04/25/2014  . Skin lesions 12/08/2013  . Major depressive disorder, recurrent episode, severe, without mention of psychotic behavior 09/08/2012  . Healthcare maintenance 06/13/2012  . Vitamin D deficiency 06/13/2012  . Sleep difficulties 06/13/2012  . Hyperlipidemia - Mixed 05/17/2012  . Migraine 01/20/2012  . COPD (chronic obstructive pulmonary disease) ([place]) 03/05/2011  . Bilateral carpal tunnel syndrome 10/27/2010  . History of tobacco abuse 08/18/2008  . BACK PAIN WITH RADICULOPATHY 08/17/2008  . Essential hypertension, benign 11/05/2007  . GERD 11/05/2007   Current Outpatient Medications on File Prior to Visit  Medication Sig Dispense Refill  . albuterol (PROAIR HFA) 108 (90 Base) MCG/ACT inhaler Inhale 2 puffs as directed into the lungs. 1 Inhaler 3  . amLODipine (NORVASC) 10 MG tablet Take 1 tablet (10 mg total) by mouth at bedtime. 90 tablet 3  . buPROPion (WELLBUTRIN XL) 300 MG 24 hr tablet Take 1 tablet  (300 mg total) by mouth every morning. 90 tablet 0  . cetirizine (ZYRTEC) 10 MG tablet Take 1 tablet (10 mg total) by mouth daily. 30 tablet 11  . clobetasol ointment (TEMOVATE) 0.05 % APPLY EXTERNALLY TO THE AFFECTED AREA TWICE DAILY    . cloNIDine (CATAPRES) 0.1 MG tablet Take 0.1 mg by mouth at bedtime.    . clotrimazole (LOTRIMIN) 1 % cream Apply 1 application topically 2 (two) times daily. 60 g 0  . divalproex (DEPAKOTE) 500 MG DR tablet Take 1 tablet (500 mg total) by mouth at bedtime. 90 tablet 0  . doxycycline (VIBRA-TABS) 100 MG tablet Take 1 tablet (100 mg total) by mouth 2 (two) times daily. 14 tablet 0  . fluconazole (DIFLUCAN) 150 MG tablet TAKE 1 TABLET(150 MG) BY MOUTH 1 TIME 1 tablet 0  . fluticasone (FLONASE) 50 MCG/ACT nasal spray Place 2 sprays into both nostrils daily. 16 g 6  . hydrochlorothiazide (HYDRODIURIL) 12.5 MG tablet Take 1 tablet (12.5 mg total) daily by mouth. 30 tablet 5  . lidocaine (XYLOCAINE) 2 % solution Use as directed 20 mLs as needed in the mouth or throat for mouth pain. 100 mL 0  . linaclotide (LINZESS) 290 MCG CAPS capsule TAKE 1 CAPSULE BY MOUTH DAILY BEFORE BREAKFAST 90 capsule 3  . losartan (COZAAR) 25 MG tablet TAKE 1 TABLET(25 MG) BY MOUTH DAILY 30 tablet 11  . metFORMIN (GLUCOPHAGE-XR) 500 MG 24 hr tablet Take 1 tablet (500 mg total) by mouth 2 (two) times a day. 180 tablet 3  . mirtazapine (REMERON) 30 MG tablet TAKE 1 TABLET(30 MG) BY MOUTH AT BEDTIME 90 tablet 0  .  mupirocin ointment (BACTROBAN) 2 % APPLY EXTERNALLY TO THE AFFECTED AREA THREE TIMES DAILY    . pantoprazole (PROTONIX) 40 MG tablet TAKE 1 TABLET BY MOUTH DAILY. 90 tablet 0  . potassium chloride SA (K-DUR,KLOR-CON) 20 MEQ tablet Take 1 tablet (20 mEq total) daily by mouth. 30 tablet 0  . propranolol (INDERAL) 40 MG tablet Take 1 tablet (40 mg total) by mouth 2 (two) times daily. 60 tablet 11  . triamcinolone ointment (KENALOG) 0.5 % Apply 1 application topically 2 (two) times daily.  Do not use for more than 1 week at a time. 60 g 3  . valACYclovir (VALTREX) 500 MG tablet Take 1 tablet (500 mg total) by mouth 2 (two) times daily. x5 days as needed for flares 30 tablet 3   No current facility-administered medications on file prior to visit.   Allergies  Allergen Reactions  . Aspirin Other (See Comments)    Dr told pt not to take asa d/t bleeding ulcer in 1985.  [name] Penicillins Nausea Only    Tablets only.  Denies any airway involvement.    . Latex Rash    Relieved with over the counter medications.    Recent Results (from the past 2160 hour(s))  Novel Coronavirus, NAA ([facility])     Status: None   Collection Time: 11/18/19  1:01 PM   Specimen: Nasopharyngeal(NP) swabs in vial transport medium   NASOPHARYNGE  TESTING  Result Value Ref Range   SARS-CoV-2, NAA Not Detected Not Detected    Comment: This nucleic acid amplification test was developed and its performance characteristics determined by [employer], [employer] and [employer]. Nucleic acid amplification tests include RT-PCR and TMA. This test has not been FDA cleared or approved. This test has been authorized by FDA under an Emergency Use Authorization (EUA). This test is only authorized for the duration of time the declaration that circumstances exist justifying the authorization of the emergency use of in vitro diagnostic tests for detection of SARS-CoV-2 virus and/or diagnosis of COVID-19 infection under section 564(b)(1) of the Act, 21 U.S.C. [id number]) (1), unless the authorization is terminated or revoked sooner. When diagnostic testing is negative, the possibility of a false negative result should be considered in the context of a patient's recent exposures and the presence of clinical signs and symptoms consistent with COVID-19. An individual without symptoms of COVID-19 and who is not shedding SARS-CoV-2 virus wo uld expect to have a negative (not detected) result in this assay.     Objective: General:  Patient is awake, alert, and oriented x 3 and in no acute distress.  Integument: Skin is warm, dry and supple bilateral. Nails are tender, long, thickened and dystrophic with subungual debris, consistent with onychomycosis, 1-5 bilateral. No signs of infection. At right heel there is a keratotic lesion with nucleated core consistent with porokeratosis, no signs of infection, Melanin spots plantar feet which are normal, Remaining integument unremarkable.  Vasculature:  Dorsalis Pedis pulse 1/4 bilateral. Posterior Tibial pulse  1/4 bilateral. Capillary fill time <3 sec 1-5 bilateral. Positive hair growth to the level of the digits. Temperature gradient within normal limits. No varicosities present bilateral. No edema present bilateral.   Neurology: The patient has intact sensation measured with a 5.07/10g Semmes Weinstein Monofilament at all pedal sites bilateral . Vibratory sensation intact bilateral with tuning fork. No Babinski sign present bilateral.   Musculoskeletal: No symptomatic pedal deformities noted bilateral. Muscular strength 5/5 in all lower extremity muscular groups bilateral without pain on range of motion. No tenderness  with calf compression bilateral.  Assessment and Plan: Problem List Items Addressed This Visit    None    Visit Diagnoses    Pain due to onychomycosis of toenails of both feet    -  Primary   Relevant Medications   mupirocin ointment (BACTROBAN) 2 %   Porokeratosis       Diabetes mellitus without complication ([place])          -Examined patient. -Re-Discussed and educated patient on diabetic foot care, especially with  regards to the vascular, neurological and musculoskeletal systems.  -Stressed the importance of good glycemic control and the detriment of not  controlling glucose levels in relation to the foot. -Mechanically debrided all nails 1-5 bilateral using sterile nail nipper and filed with dremel without incident  -Mechanically debrided at no charge  keratosis at right heel using chisel blade without incident -Advised daily emollients; foot miracle sample given again this visit  -Answered all patient questions -Patient to return  in 3 months for at risk foot care -Patient advised to call the office if any problems or questions arise in the meantime.  Landis Martins, DPM

## 2020-01-11 MED ORDER — FLUCONAZOLE 150 MG PO TABS: ORAL_TABLET | ORAL | 0 refills | Status: DC

## 2020-01-12 ENCOUNTER — Telehealth: Payer: Self-pay | Admitting: Family Medicine

## 2020-01-12 NOTE — Telephone Encounter (Signed)
Pt called and would like a referral to see an ENT doctor. She has been having ear aches and sore throat.

## 2020-01-13 ENCOUNTER — Other Ambulatory Visit: Payer: Self-pay | Admitting: Family Medicine

## 2020-01-13 DIAGNOSIS — H9203 Otalgia, bilateral: Secondary | ICD-10-CM

## 2020-01-13 NOTE — Telephone Encounter (Signed)
I will go ahead and place the referral, but [name]'s ear aches and throat pain may be able to be managed at our office without a referral, so I would encourage her to make an appointment at our office for this complaint while waiting on the referral.  If we can identify the problem, she may not need to see the ENT.

## 2020-01-16 ENCOUNTER — Other Ambulatory Visit: Payer: Self-pay | Admitting: *Deleted

## 2020-01-16 DIAGNOSIS — B009 Herpesviral infection, unspecified: Secondary | ICD-10-CM

## 2020-01-16 DIAGNOSIS — B379 Candidiasis, unspecified: Secondary | ICD-10-CM

## 2020-01-16 DIAGNOSIS — J302 Other seasonal allergic rhinitis: Secondary | ICD-10-CM

## 2020-01-17 MED ORDER — FLUTICASONE PROPIONATE 50 MCG/ACT NA SUSP: 2.0000 | Freq: Every day | NASAL | 0 refills | Status: DC

## 2020-01-17 MED ORDER — FLUCONAZOLE 150 MG PO TABS: ORAL_TABLET | ORAL | 0 refills | Status: DC

## 2020-01-17 MED ORDER — VALACYCLOVIR HCL 500 MG PO TABS: 500.0000 mg | ORAL_TABLET | Freq: Two times a day (BID) | ORAL | 0 refills | Status: DC

## 2020-01-24 NOTE — Telephone Encounter (Signed)
LVM for pt to call office to inform her of below.[name], CMA  

## 2020-01-28 ENCOUNTER — Ambulatory Visit: Payer: Self-pay

## 2020-02-02 ENCOUNTER — Telehealth (HOSPITAL_COMMUNITY): Payer: Self-pay | Admitting: *Deleted

## 2020-02-02 ENCOUNTER — Other Ambulatory Visit (HOSPITAL_COMMUNITY): Payer: Self-pay | Admitting: *Deleted

## 2020-02-02 DIAGNOSIS — F330 Major depressive disorder, recurrent, mild: Secondary | ICD-10-CM

## 2020-02-02 DIAGNOSIS — F411 Generalized anxiety disorder: Secondary | ICD-10-CM

## 2020-02-02 MED ORDER — MIRTAZAPINE 30 MG PO TABS: ORAL_TABLET | ORAL | 0 refills | Status: DC

## 2020-02-02 MED ORDER — BUPROPION HCL ER (XL) 300 MG PO TB24: 300.0000 mg | ORAL_TABLET | ORAL | 0 refills | Status: DC

## 2020-02-02 MED ORDER — DIVALPROEX SODIUM 500 MG PO DR TAB: 500.0000 mg | DELAYED_RELEASE_TABLET | Freq: Every day | ORAL | 0 refills | Status: DC

## 2020-02-02 NOTE — Telephone Encounter (Signed)
You can provide 30 days supply. She need to keep her appointment for future refills.

## 2020-02-02 NOTE — Telephone Encounter (Signed)
Pt called requesting refills on all meds written by you. Pt last appointment was on 06/28/19. 09/27/19 was a no show. Pt does have an appointment on 02/09/20. Please review and advise.

## 2020-02-08 ENCOUNTER — Ambulatory Visit (INDEPENDENT_AMBULATORY_CARE_PROVIDER_SITE_OTHER): Payer: 59 | Admitting: Family Medicine

## 2020-02-08 ENCOUNTER — Other Ambulatory Visit: Payer: Self-pay

## 2020-02-08 VITALS — BP 115/80 | HR 83 | Ht 61.0 in | Wt 144.8 lb

## 2020-02-08 DIAGNOSIS — Z9109 Other allergy status, other than to drugs and biological substances: Secondary | ICD-10-CM

## 2020-02-08 DIAGNOSIS — E119 Type 2 diabetes mellitus without complications: Secondary | ICD-10-CM | POA: Diagnosis not present

## 2020-02-08 DIAGNOSIS — L03114 Cellulitis of left upper limb: Secondary | ICD-10-CM

## 2020-02-08 LAB — POCT GLYCOSYLATED HEMOGLOBIN (HGB A1C): HbA1c, POC (controlled diabetic range): 6.4 % (ref 0.0–7.0)

## 2020-02-08 MED ORDER — FLUTICASONE PROPIONATE 50 MCG/ACT NA SUSP: 2.0000 | Freq: Every day | NASAL | 0 refills | Status: DC

## 2020-02-08 MED ORDER — LORATADINE 10 MG PO TBDP: 10.0000 mg | ORAL_TABLET | Freq: Every day | ORAL | 12 refills | Status: DC

## 2020-02-08 MED ORDER — DOXYCYCLINE HYCLATE 100 MG PO TABS: 100.0000 mg | ORAL_TABLET | Freq: Two times a day (BID) | ORAL | 0 refills | Status: DC
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
## 2020-02-08 NOTE — Patient Instructions (Signed)
It was nice seeing you today Ms. Pleger!  Your A1c is 6.4 today, which means that your diabetes continues to be very well controlled. You're doing a great job with this. Please make an appointment to see your eye doctor at your earliest convenience. Please also consider scheduling your colonoscopy whenever your daughter can provide transportation. I would like for you to get this test done this year if possible.  I am worried that the spot on your arm could indicate an infection in your skin. I am prescribing antibiotic called doxycycline, which you have taken before. This antibiotic targets most of the bacteria that cause skin infections. I'm also prescribing fluconazole in case you get a yeast infection after taking this antibiotic.  I am also including a letter about the mold in your apartment to see if this will help convince your apartment owners to clean this.  We are checking your cholesterol today, and I will be in touch regarding this result.  If you have any questions or concerns, please feel free to call the clinic.   Be well,  Dr. Shan Levans

## 2020-02-09 ENCOUNTER — Ambulatory Visit (INDEPENDENT_AMBULATORY_CARE_PROVIDER_SITE_OTHER): Payer: 59 | Admitting: Psychiatry

## 2020-02-09 ENCOUNTER — Encounter (HOSPITAL_COMMUNITY): Payer: Self-pay | Admitting: Psychiatry

## 2020-02-09 ENCOUNTER — Other Ambulatory Visit: Payer: Self-pay | Admitting: Family Medicine

## 2020-02-09 ENCOUNTER — Telehealth (HOSPITAL_COMMUNITY): Payer: Self-pay

## 2020-02-09 DIAGNOSIS — Z9109 Other allergy status, other than to drugs and biological substances: Secondary | ICD-10-CM

## 2020-02-09 DIAGNOSIS — L03114 Cellulitis of left upper limb: Secondary | ICD-10-CM | POA: Insufficient documentation

## 2020-02-09 DIAGNOSIS — F411 Generalized anxiety disorder: Secondary | ICD-10-CM

## 2020-02-09 DIAGNOSIS — F330 Major depressive disorder, recurrent, mild: Secondary | ICD-10-CM

## 2020-02-09 HISTORY — DX: Other allergy status, other than to drugs and biological substances: Z91.09

## 2020-02-09 HISTORY — DX: Cellulitis of left upper limb: L03.114

## 2020-02-09 LAB — LIPID PANEL
Chol/HDL Ratio: 6 ratio — ABNORMAL HIGH (ref 0.0–4.4)
Cholesterol, Total: 253 mg/dL — ABNORMAL HIGH (ref 100–199)
HDL: 42 mg/dL (ref >39)
LDL Chol Calc (NIH): 173 mg/dL — ABNORMAL HIGH (ref 0–99)
Triglycerides: 204 mg/dL — ABNORMAL HIGH (ref 0–149)
VLDL Cholesterol Cal: 38 mg/dL (ref 5–40)

## 2020-02-09 MED ORDER — DIVALPROEX SODIUM 250 MG PO DR TAB: 750.0000 mg | DELAYED_RELEASE_TABLET | Freq: Every day | ORAL | 0 refills | Status: DC

## 2020-02-09 MED ORDER — MIRTAZAPINE 30 MG PO TABS: ORAL_TABLET | ORAL | 0 refills | Status: DC

## 2020-02-09 MED ORDER — ATORVASTATIN CALCIUM 80 MG PO TABS: 80.0000 mg | ORAL_TABLET | Freq: Every day | ORAL | 3 refills | Status: DC

## 2020-02-09 MED ORDER — CLOTRIMAZOLE 1 % EX CREA: 1.0000 "application " | TOPICAL_CREAM | Freq: Two times a day (BID) | CUTANEOUS | 0 refills | Status: DC

## 2020-02-09 MED ORDER — BUPROPION HCL ER (XL) 300 MG PO TB24: 300.0000 mg | ORAL_TABLET | ORAL | 0 refills | Status: DC

## 2020-02-09 NOTE — Telephone Encounter (Signed)
done
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
## 2020-02-09 NOTE — Assessment & Plan Note (Addendum)
Continues to be well controlled today with an A1c of 6.4.  We will continue current regimen of Metformin 500 mg once daily.  We will obtain a lipid panel today.  Patient has been on atorvastatin in the past is not currently on this medication does not know why.  We will likely restart this medication after her lipid panel returns.

## 2020-02-09 NOTE — Telephone Encounter (Signed)
Pt requesting 90 day supply of Divalproex tabs instead of ordered 30 days. If appropriate, please send to [facility] on [address]

## 2020-02-09 NOTE — Progress Notes (Signed)
Virtual Visit via Telephone Note  I connected with [name] on 02/09/20 at 11:00 AM EDT by telephone and verified that I am speaking with the correct person using two identifiers.   I discussed the limitations, risks, security and privacy concerns of performing an evaluation and management service by telephone and the availability of in person appointments. I also discussed with the patient that there may be a patient responsible charge related to this service. The patient expressed understanding and agreed to proceed.   History of Present Illness: Patient was evaluated by phone session. She had last evaluated in October 2020. She missed appointment. However she is taking the medication as prescribed. She noticed lately some irritability, anxiety and poor sleep. She endorsed waking up 3:00 in the morning despite taking the medication before going to bed. Recently she had blood work and her hemoglobin A1c is 6.4 which is mildly increased from the past. Her Depakote level in October was 68. She has no tremors shakes or any EPS. She lives by herself and her daughter lives close by who does come twice a week to visit her. She denies any paranoia or any hallucination. She denies any suicidal thoughts. Her energy level is fair. Her weight is unchanged from the past.  Past Psychiatric History:Reviewed. [facility] [place] [facility]. TriedPaxil,Prozac, vistaril andZoloft. Noh/osuicidal attempt or any psychosis.   Recent Results (from the past 2160 hour(s))  Novel Coronavirus, NAA ([facility])     Status: None   Collection Time: 11/18/19  1:01 PM   Specimen: Nasopharyngeal(NP) swabs in vial transport medium   NASOPHARYNGE  TESTING  Result Value Ref Range   SARS-CoV-2, NAA Not Detected Not Detected    Comment: This nucleic acid amplification test was developed and its  performance characteristics determined by [employer], [employer] and [employer]. Nucleic acid amplification tests include RT-PCR and TMA. This test has not been FDA cleared or approved. This test has been authorized by FDA under an Emergency Use Authorization (EUA). This test is only authorized for the duration of time the declaration that circumstances exist justifying the authorization of the emergency use of in vitro diagnostic tests for detection of SARS-CoV-2 virus and/or diagnosis of COVID-19 infection under section 564(b)(1) of the Act, 21 U.S.C. [id number]) (1), unless the authorization is terminated or revoked sooner. When diagnostic testing is negative, the possibility of a false negative result should be considered in the context of a patient's recent exposures and the presence of clinical signs and symptoms consistent with COVID-19. An individual without symptoms of COVID-19 and who is not shedding SARS-CoV-2 virus wo uld expect to have a negative (not detected) result in this assay.   HgB A1c     Status: None   Collection Time: 02/08/20  3:47 PM  Result Value Ref Range   Hemoglobin A1C     HbA1c POC (<> result, manual entry)     HbA1c, POC (prediabetic range)     HbA1c, POC (controlled diabetic range) 6.4 0.0 - 7.0 %  Lipid Panel     Status: Abnormal   Collection Time: 02/08/20  4:52 PM  Result Value Ref Range   Cholesterol, Total 253 (H) 100 - 199 mg/dL   Triglycerides 204 (H) 0 - 149 mg/dL   HDL 42 >39 mg/dL   VLDL Cholesterol Cal 38 5 - 40 mg/dL   LDL Chol Calc (NIH) 173 (H) 0 - 99 mg/dL   Chol/HDL Ratio 6.0 (H)  0.0 - 4.4 ratio    Comment:                                   T. Chol/HDL Ratio                                             Men  Women                               1/2 Avg.Risk  3.4    3.3                                   Avg.Risk  5.0    4.4                                2X Avg.Risk  9.6    7.1                                3X Avg.Risk 23.4   11.0        Psychiatric Specialty Exam: Physical Exam  Review of Systems  Weight 144 lb (65.3 kg).Body mass index is 27.21 kg/m.  General Appearance: NA  Eye Contact:  NA  Speech:  Normal Rate  Volume:  Normal  Mood:  Anxious  Affect:  NA  Thought Process:  Descriptions of Associations: Intact  Orientation:  Full (Time, Place, and Person)  Thought Content:  Rumination  Suicidal Thoughts:  No  Homicidal Thoughts:  No  Memory:  Immediate;   Good Recent;   Good Remote;   Good  Judgement:  Fair  Insight:  Present  Psychomotor Activity:  NA  Concentration:  Concentration: Fair and Attention Span: Fair  Recall:  Good  Fund of Knowledge:  Good  Language:  Good  Akathisia:  No  Handed:  Right  AIMS (if indicated):     Assets:  Communication Skills Desire for Improvement Housing Social Support  ADL's:  Intact  Cognition:  WNL  Sleep:   poor 4-5 hrs      Assessment and Plan: Major depressive disorder, recurrent. Generalized anxiety disorder.  I reviewed blood work results last recorded level which was 50. She is experiencing insomnia. I recommend to try Depakote 750 mg at bedtime. Continue Remeron 30 mg at bedtime and Wellbutrin XL 300 mg daily. Discussed medication side effects and benefits. She is not interested in therapy. Recommended to call us back if she has any question or any concern. Will follow up in month if medicine needs to be further adjusted.  Follow Up Instructions:    I discussed the assessment and treatment plan with the patient. The patient was provided an opportunity to ask questions and all were answered. The patient agreed with the plan and demonstrated an understanding of the instructions.   The patient was advised to call back or seek an in-person evaluation if the symptoms worsen or if the condition fails to improve as anticipated.  I provided 20 minutes of non-face-to-face time during this encounter.   Kathlee Nations, MD

## 2020-02-09 NOTE — Assessment & Plan Note (Signed)
Will start Claritin once daily and encourage patient to continue taking Flonase.  We will also provide a letter that patient can give to her apartment management asking them to investigate and address the mold that is in her apartment for her health.

## 2020-02-10 ENCOUNTER — Other Ambulatory Visit: Payer: Self-pay

## 2020-02-10 DIAGNOSIS — Z9109 Other allergy status, other than to drugs and biological substances: Secondary | ICD-10-CM

## 2020-02-10 MED ORDER — FLUTICASONE PROPIONATE 50 MCG/ACT NA SUSP: 2.0000 | Freq: Every day | NASAL | 0 refills | Status: DC

## 2020-02-17 ENCOUNTER — Other Ambulatory Visit: Payer: Self-pay

## 2020-02-17 MED ORDER — AMLODIPINE BESYLATE 10 MG PO TABS: 10.0000 mg | ORAL_TABLET | Freq: Every day | ORAL | 3 refills | Status: DC

## 2020-03-09 ENCOUNTER — Other Ambulatory Visit (HOSPITAL_COMMUNITY): Payer: Self-pay | Admitting: *Deleted

## 2020-03-09 ENCOUNTER — Other Ambulatory Visit: Payer: Self-pay | Admitting: Internal Medicine

## 2020-03-09 DIAGNOSIS — F411 Generalized anxiety disorder: Secondary | ICD-10-CM

## 2020-03-09 DIAGNOSIS — F330 Major depressive disorder, recurrent, mild: Secondary | ICD-10-CM

## 2020-03-09 MED ORDER — MIRTAZAPINE 30 MG PO TABS: ORAL_TABLET | ORAL | 0 refills | Status: DC

## 2020-03-09 MED ORDER — BUPROPION HCL ER (XL) 300 MG PO TB24: 300.0000 mg | ORAL_TABLET | ORAL | 0 refills | Status: DC

## 2020-03-12 ENCOUNTER — Encounter (HOSPITAL_COMMUNITY): Payer: Self-pay | Admitting: Psychiatry

## 2020-03-12 ENCOUNTER — Other Ambulatory Visit: Payer: Self-pay

## 2020-03-12 ENCOUNTER — Telehealth (INDEPENDENT_AMBULATORY_CARE_PROVIDER_SITE_OTHER): Payer: 59 | Admitting: Psychiatry

## 2020-03-12 DIAGNOSIS — F419 Anxiety disorder, unspecified: Secondary | ICD-10-CM | POA: Diagnosis not present

## 2020-03-12 DIAGNOSIS — F330 Major depressive disorder, recurrent, mild: Secondary | ICD-10-CM

## 2020-03-12 MED ORDER — DIVALPROEX SODIUM 250 MG PO DR TAB: 750.0000 mg | DELAYED_RELEASE_TABLET | Freq: Every day | ORAL | 0 refills | Status: DC

## 2020-03-12 MED ORDER — MIRTAZAPINE 30 MG PO TABS: ORAL_TABLET | ORAL | 0 refills | Status: DC

## 2020-03-12 MED ORDER — BUPROPION HCL ER (XL) 300 MG PO TB24: 300.0000 mg | ORAL_TABLET | ORAL | 0 refills | Status: DC

## 2020-03-12 NOTE — Progress Notes (Signed)
Virtual Visit via Telephone Note  I connected with [name] on 03/12/20 at  3:20 PM EDT by telephone and verified that I am speaking with the correct person using two identifiers.   I discussed the limitations, risks, security and privacy concerns of performing an evaluation and management service by telephone and the availability of in person appointments. I also discussed with the patient that there may be a patient responsible charge related to this service. The patient expressed understanding and agreed to proceed.   History of Present Illness: Patient is evaluated by phone session.  On the last visit we increased Depakote to 750 mg to take at bedtime.  Patient was experiencing insomnia, irritability and anxiety.  She is feeling better.  Her sleep is also improved.  She denies any anger or any severe mood swings.  She has no side effects.  She has no tremors.  She lives by herself and her daughter lives close by who does come twice a week to check on her.  Patient reported her weight is unchanged from the past.  She like to keep the current medication.  She denies any panic attack.   Past Psychiatric History:Reviewed. [facility] [place] and [facility]. TriedPaxil,Prozac, vistaril andZoloft. Noh/osuicidal attempt or any psychosis.  Psychiatric Specialty Exam: Physical Exam  Review of Systems  There were no vitals taken for this visit.There is no height or weight on file to calculate BMI.  General Appearance: NA  Eye Contact:  NA  Speech:  Clear and Coherent  Volume:  Normal  Mood:  Euthymic  Affect:  NA  Thought Process:  Goal Directed  Orientation:  Full (Time, Place, and Person)  Thought Content:  WDL  Suicidal Thoughts:  No  Homicidal Thoughts:  No  Memory:  Immediate;   Good Recent;   Good Remote;   Good  Judgement:  Good  Insight:  Good   Psychomotor Activity:  NA  Concentration:  Concentration: Good and Attention Span: Good  Recall:  Good  Fund of Knowledge:  Good  Language:  Good  Akathisia:  No  Handed:  Right  AIMS (if indicated):     Assets:  Communication Skills Desire for Improvement Housing Resilience Transportation  ADL's:  Intact  Cognition:  WNL  Sleep:   good      Assessment and Plan: Major depressive disorder, recurrent.  Anxiety.  Patient doing better on increased dose of Depakote 750 at bedtime.  Her level was low on 500 mg.  Continue Wellbutrin XL 300 mg daily and Remeron 30 mg at bedtime.  Recommended to call us back if she has any questions or any concerns.  Follow-up in 3 months.    Follow Up Instructions:    I discussed the assessment and treatment plan with the patient. The patient was provided an opportunity to ask questions and all were answered. The patient agreed with the plan and demonstrated an understanding of the instructions.   The patient was advised to call back or seek an in-person evaluation if the symptoms worsen or if the condition fails to improve as anticipated.  I provided 15 minutes of non-face-to-face time during this encounter.   Kathlee Nations, MD

## 2020-03-27 ENCOUNTER — Ambulatory Visit: Payer: 59 | Attending: Internal Medicine

## 2020-03-27 DIAGNOSIS — Z23 Encounter for immunization: Secondary | ICD-10-CM

## 2020-03-27 NOTE — Progress Notes (Signed)
   Covid-19 Vaccination Clinic  Name:  [name]    MRN: [id number] DOB: May 17, 1951  03/27/2020  [name] was observed post Covid-19 immunization for 15 minutes without incident. She was provided with Vaccine Information Sheet and instruction to access the V-Safe system.   [name] was instructed to call 911 with any severe reactions post vaccine: [name] Difficulty breathing  . Swelling of face and throat  . A fast heartbeat  . A bad rash all over body  . Dizziness and weakness   Immunizations Administered    Name Date Dose VIS Date Route   Pfizer COVID-19 Vaccine 03/27/2020  9:55 AM 0.3 mL 12/21/2018 Intramuscular   Manufacturer: [place]   Lot: [id number]   [place]: [id number]

## 2020-03-30 ENCOUNTER — Ambulatory Visit (INDEPENDENT_AMBULATORY_CARE_PROVIDER_SITE_OTHER): Payer: 59 | Admitting: Family Medicine

## 2020-03-30 ENCOUNTER — Other Ambulatory Visit: Payer: Self-pay

## 2020-03-30 ENCOUNTER — Encounter: Payer: Self-pay | Admitting: Family Medicine

## 2020-03-30 VITALS — BP 112/72 | HR 74 | Wt 147.4 lb

## 2020-03-30 DIAGNOSIS — N3946 Mixed incontinence: Secondary | ICD-10-CM | POA: Diagnosis not present

## 2020-03-30 DIAGNOSIS — R350 Frequency of micturition: Secondary | ICD-10-CM

## 2020-03-30 LAB — POCT URINALYSIS DIP (MANUAL ENTRY)
Bilirubin, UA: NEGATIVE
Blood, UA: NEGATIVE
Glucose, UA: NEGATIVE mg/dL
Leukocytes, UA: NEGATIVE
Nitrite, UA: NEGATIVE
Protein Ur, POC: NEGATIVE mg/dL
Spec Grav, UA: 1.02 (ref 1.010–1.025)
Urobilinogen, UA: 0.2 E.U./dL
pH, UA: 6 (ref 5.0–8.0)

## 2020-03-30 NOTE — Assessment & Plan Note (Signed)
Predominantly urge incontinence.  Due to patient's dysuria, we will check a UA, but this is a chronic problem that is likely independent of any UTI.  Reviewed options with patient, which includes bladder training a second time, medications, referral to PT or urology.  Patient would like to try bladder training again.  Gave patient detailed instructions and will focus on increasing her time between voids by 15 minutes with the eventual goal of having a void every 3-4 hours.  She will let me know in about 1 month if she continues to have significant difficulty with her urinary frequency, and we can try a medication at that time.  Our last option would be referral to urology if medications and bladder training are minimally effective.

## 2020-03-30 NOTE — Patient Instructions (Signed)
Overactive Bladder, Adult  Overactive bladder refers to a condition in which a person has a sudden need to pass urine. The person may leak urine if he or she cannot get to the bathroom fast enough (urinary incontinence). A person with this condition may also wake up several times in the night to go to the bathroom. Overactive bladder is associated with poor nerve signals between your bladder and your brain. Your bladder may get the signal to empty before it is full. You may also have very sensitive muscles that make your bladder squeeze too soon. These symptoms might interfere with daily work or social activities. What are the causes? This condition may be associated with or caused by:  Urinary tract infection.  Infection of nearby tissues, such as the prostate.  Prostate enlargement.  Surgery on the uterus or urethra.  Bladder stones, inflammation, or tumors.  Drinking too much caffeine or alcohol.  Certain medicines, especially medicines that get rid of extra fluid in the body (diuretics).  Muscle or nerve weakness, especially from: ? A spinal cord injury. ? Stroke. ? Multiple sclerosis. ? Parkinson's disease.  Diabetes.  Constipation. What increases the risk? You may be at greater risk for overactive bladder if you:  Are an older adult.  Smoke.  Are going through menopause.  Have prostate problems.  Have a neurological disease, such as stroke, dementia, Parkinson's disease, or multiple sclerosis (MS).  Eat or drink things that irritate the bladder. These include alcohol, spicy food, and caffeine.  Are overweight or obese. What are the signs or symptoms? Symptoms of this condition include:  Sudden, strong urge to urinate.  Leaking urine.  Urinating 8 or more times a day.  Waking up to urinate 2 or more times a night. How is this diagnosed? Your health care provider may suspect overactive bladder based on your symptoms. He or she will diagnose this condition  by:  A physical exam and medical history.  Blood or urine tests. You might need bladder or urine tests to help determine what is causing your overactive bladder. You might also need to see a health care provider who specializes in urinary tract problems (urologist). How is this treated? Treatment for overactive bladder depends on the cause of your condition and whether it is mild or severe. You can also make lifestyle changes at home. Options include:  Bladder training. This may include: ? Learning to control the urge to urinate by following a schedule that directs you to urinate at regular intervals (timed voiding). ? Doing Kegel exercises to strengthen your pelvic floor muscles, which support your bladder. Toning these muscles can help you control urination, even if your bladder muscles are overactive.  Special devices. This may include: ? Biofeedback, which uses sensors to help you become aware of your body's signals. ? Electrical stimulation, which uses electrodes placed inside the body (implanted) or outside the body. These electrodes send gentle pulses of electricity to strengthen the nerves or muscles that control the bladder. ? Women may use a plastic device that fits into the vagina and supports the bladder (pessary).  Medicines. ? Antibiotics to treat bladder infection. ? Antispasmodics to stop the bladder from releasing urine at the wrong time. ? Tricyclic antidepressants to relax bladder muscles. ? Injections of botulinum toxin type A directly into the bladder tissue to relax bladder muscles.  Lifestyle changes. This may include: ? Weight loss. Talk to your health care provider about weight loss methods that would work best for you. ?   Diet changes. This may include reducing how much alcohol and caffeine you consume, or drinking fluids at different times of the day. ? Not smoking. Do not use any products that contain nicotine or tobacco, such as cigarettes and e-cigarettes. If  you need help quitting, ask your health care provider.  Surgery. ? A device may be implanted to help manage the nerve signals that control urination. ? An electrode may be implanted to stimulate electrical signals in the bladder. ? A procedure may be done to change the shape of the bladder. This is done only in very severe cases. Follow these instructions at home: Lifestyle  Make any diet or lifestyle changes that are recommended by your health care provider. These may include: ? Drinking less fluid or drinking fluids at different times of the day. ? Cutting down on caffeine or alcohol. ? Doing Kegel exercises. ? Losing weight if needed. ? Eating a healthy and balanced diet to prevent constipation. This may include:  Eating foods that are high in fiber, such as fresh fruits and vegetables, whole grains, and beans.  Limiting foods that are high in fat and processed sugars, such as fried and sweet foods. General instructions  Take over-the-counter and prescription medicines only as told by your health care provider.  If you were prescribed an antibiotic medicine, take it as told by your health care provider. Do not stop taking the antibiotic even if you start to feel better.  Use any implants or pessary as told by your health care provider.  If needed, wear pads to absorb urine leakage.  Keep a journal or log to track how much and when you drink and when you feel the need to urinate. This will help your health care provider monitor your condition.  Keep all follow-up visits as told by your health care provider. This is important. Contact a health care provider if:  You have a fever.  Your symptoms do not get better with treatment.  Your pain and discomfort get worse.  You have more frequent urges to urinate. Get help right away if:  You are not able to control your bladder. Summary  Overactive bladder refers to a condition in which a person has a sudden need to pass  urine.  Several conditions may lead to an overactive bladder.  Treatment for overactive bladder depends on the cause and severity of your condition.  Follow your health care provider's instructions about lifestyle changes, doing Kegel exercises, keeping a journal, and taking medicines. This information is not intended to replace advice given to you by your health care provider. Make sure you discuss any questions you have with your health care provider. Document Revised: 02/03/2019 Document Reviewed: 10/29/2017 Elsevier Patient Education  2020 [employer].  Bladder training is an important form of behavior therapy that can be effective in treating urinary incontinence. The goals are to increase the amount of time between emptying your bladder and the amount of fluids your bladder can hold. It also can diminish leakage and the sense of urgency associated with the problem.  Bladder training requires following a fixed voiding schedule, whether or not you feel the urge to urinate. If you feel an urge to urinate before the assigned interval, you should use urge suppression techniques -- such as relaxation and Kegel exercises.  As success is achieved, the interval is lengthened in 15- to 30-minute increments until it is possible to remain comfortable for three or four hours. This goal can be individualized to suit  each woman's needs and desires.  Keeping a diary of your bladder activity is very important. This helps your health care provider determine the correct place to start the training and to monitor your progress throughout your program.  Bladder Retraining Instructions Empty your bladder as soon as you get up in the morning. This act starts your retraining schedule.  Go to the bathroom at the specific times you and your health care provider have discussed. Wait until your next scheduled time before you urinate again.  Be sure to empty your bladder even if you feel no urge to urinate.  Follow the schedule during waking hours only. At night, go to the bathroom only if you awaken and find it necessary.  When you feel the urge to urinate before the next designated time, use "urge suppression" techniques or try relaxation techniques like deep breathing. Focus on relaxing all other muscles. If possible, sit down until the sensation passes.  If the urge is suppressed, adhere to the schedule. If you cannot suppress the urge, wait five minutes then slowly make your way to the bathroom. After urinating, re-establish the schedule. Repeat this process every time an urge is felt.  When you have accomplished your initial goal, gradually increase the time between emptying your bladder by 15-minute intervals. Try to increase your interval each week. However, you will be the best judge of how quickly you can advance to the next step. Increase the time between each urination until you reach a three- to four-hour voiding interval.  It should take between six to 12 weeks to accomplish your ultimate goal. Don't be discouraged by setbacks. You may find you have good days and bad days. As you continue bladder retraining, you will start to notice more and more good days, so keep practicing.  You will hasten your success by doing your pelvic muscles exercises faithfully every day. Your diaries will help you see your progress and identify your problem times.

## 2020-03-30 NOTE — Progress Notes (Signed)
    SUBJECTIVE:   CHIEF COMPLAINT / HPI:   Urinary frequency Patient notes that she has had worsening of her urinary frequency and urgency recently.  This is an acute on chronic problem for her, and it had improved after she did some bladder training at home earlier this year.  She says that she goes to the bathroom to void about every 20 minutes, and this is even more frequent when she is not at her own house.  She denies any leakage of urine, although sometimes she will have leakage with coughing or sneezing.  This is not her most pressing issue, however.  She is most concerned about her urinary frequency frequency and urgency.  She does report some dysuria that is off and on.  She denies recent fever or chills.  PERTINENT  PMH / PSH: Well-controlled type 2 diabetes, overactive bladder, hypertension  OBJECTIVE:   BP 112/72   Pulse 74   Wt 147 lb 6.4 oz (66.9 kg)   SpO2 99%   BMI 27.85 kg/m   General: well appearing, appears stated age Cardiac: RRR, no MRG Respiratory: CTAB, no rhonchi, rales, or wheezing, normal work of breathing Skin: no rashes or other lesions, warm and well perfused Psych: appropriate mood and affect  ASSESSMENT/PLAN:   Mixed urge and stress incontinence Predominantly urge incontinence.  Due to patient's dysuria, we will check a UA, but this is a chronic problem that is likely independent of any UTI.  Reviewed options with patient, which includes bladder training a second time, medications, referral to PT or urology.  Patient would like to try bladder training again.  Gave patient detailed instructions and will focus on increasing her time between voids by 15 minutes with the eventual goal of having a void every 3-4 hours.  She will let me know in about 1 month if she continues to have significant difficulty with her urinary frequency, and we can try a medication at that time.  Our last option would be referral to urology if medications and bladder training are  minimally effective.     Kathrene Alu, MD [facility]

## 2020-04-01 ENCOUNTER — Other Ambulatory Visit: Payer: Self-pay | Admitting: Family Medicine

## 2020-04-02 ENCOUNTER — Other Ambulatory Visit: Payer: Self-pay | Admitting: Family Medicine

## 2020-04-02 NOTE — Telephone Encounter (Signed)
Covering for Dr. Shan Levans. Will refill for one month, but patient needs to come in for labs to ensure her renal function is stable.

## 2020-04-16 ENCOUNTER — Encounter: Payer: Self-pay | Admitting: Family Medicine

## 2020-04-16 LAB — HM DIABETES EYE EXAM

## 2020-04-16 NOTE — Telephone Encounter (Signed)
Pt has lab appointment scheduled on 04/30/20. Rina Adney Zimmerman Rumple, CMA

## 2020-04-17 ENCOUNTER — Ambulatory Visit: Payer: 59 | Admitting: Sports Medicine

## 2020-04-23 ENCOUNTER — Ambulatory Visit: Payer: 59 | Attending: Internal Medicine

## 2020-04-23 DIAGNOSIS — Z23 Encounter for immunization: Secondary | ICD-10-CM

## 2020-04-23 NOTE — Progress Notes (Signed)
   Covid-19 Vaccination Clinic  Name:  [name]    MRN: [id number] DOB: [birth year]  04/23/2020  [name] was observed post Covid-19 immunization for 30 minutes based on pre-vaccination screening without incident. She was provided with Vaccine Information Sheet and instruction to access the V-Safe system.   [name] was instructed to call 911 with any severe reactions post vaccine: [name] Difficulty breathing  . Swelling of face and throat  . A fast heartbeat  . A bad rash all over body  . Dizziness and weakness   Immunizations Administered    Name Date Dose VIS Date Route   Pfizer COVID-19 Vaccine 04/23/2020 10:15 AM 0.3 mL 12/21/2018 Intramuscular   Manufacturer: [employer]   Lot: [id number]   [place]: [id number]

## 2020-04-29 ENCOUNTER — Telehealth: Payer: Self-pay | Admitting: Family Medicine

## 2020-04-29 NOTE — Telephone Encounter (Signed)
After Hours/ Emergency Line Call  Received a call to report that [name] received her Covid shot on Monday and developed a rash on ear Friday. That rash resolved. She now has a rash that is going into her hair line that was noticed Friday but got worst Saturday. Endorsing itchiness and flaking. No hair loss. Unsure of redness as she has not looked at it. Denying fever, cough, cold, congestion, chest pain, chest tightness. Recommended that being seen in clinic for this rash or go to urgent care if it continues to worsen.  Red flags discussed.  Will forward to PCP. Schedule with PCP 05/04/20 @2 :45PM  Gerlene Fee, DO PGY-2, [facility] 04/29/2020 1:29 PM

## 2020-04-30 ENCOUNTER — Ambulatory Visit: Payer: Self-pay

## 2020-04-30 ENCOUNTER — Ambulatory Visit: Payer: 59

## 2020-05-04 ENCOUNTER — Ambulatory Visit: Payer: 59 | Admitting: Family Medicine

## 2020-05-16 ENCOUNTER — Ambulatory Visit: Payer: 59 | Admitting: Family Medicine

## 2020-05-28 ENCOUNTER — Encounter: Payer: Self-pay | Admitting: Internal Medicine

## 2020-05-29 ENCOUNTER — Ambulatory Visit: Payer: Self-pay

## 2020-06-05 ENCOUNTER — Telehealth: Payer: Self-pay | Admitting: Internal Medicine

## 2020-06-05 MED ORDER — PANTOPRAZOLE SODIUM 40 MG PO TBEC: 40.0000 mg | DELAYED_RELEASE_TABLET | Freq: Every day | ORAL | 0 refills | Status: DC

## 2020-06-05 MED ORDER — LINACLOTIDE 290 MCG PO CAPS: ORAL_CAPSULE | ORAL | 0 refills | Status: DC

## 2020-06-05 NOTE — Telephone Encounter (Signed)
Rx sent 

## 2020-06-05 NOTE — Telephone Encounter (Signed)
Patient requesting refill on Linzess and Pantoprazole

## 2020-06-12 ENCOUNTER — Encounter (HOSPITAL_COMMUNITY): Payer: Self-pay | Admitting: Psychiatry

## 2020-06-12 ENCOUNTER — Other Ambulatory Visit: Payer: Self-pay

## 2020-06-12 ENCOUNTER — Telehealth (INDEPENDENT_AMBULATORY_CARE_PROVIDER_SITE_OTHER): Payer: 59 | Admitting: Psychiatry

## 2020-06-12 DIAGNOSIS — F419 Anxiety disorder, unspecified: Secondary | ICD-10-CM | POA: Diagnosis not present

## 2020-06-12 DIAGNOSIS — F330 Major depressive disorder, recurrent, mild: Secondary | ICD-10-CM

## 2020-06-12 MED ORDER — MIRTAZAPINE 30 MG PO TABS: ORAL_TABLET | ORAL | 0 refills | Status: DC

## 2020-06-12 MED ORDER — DIVALPROEX SODIUM 250 MG PO DR TAB: 750.0000 mg | DELAYED_RELEASE_TABLET | Freq: Every day | ORAL | 0 refills | Status: DC

## 2020-06-12 MED ORDER — BUPROPION HCL ER (XL) 300 MG PO TB24: 300.0000 mg | ORAL_TABLET | ORAL | 0 refills | Status: DC

## 2020-06-12 NOTE — Progress Notes (Signed)
Virtual Visit via Telephone Note  I connected with [name] on 06/12/20 at  3:20 PM EDT by telephone and verified that I am speaking with the correct person using two identifiers.  Location: Patient: home Provider: home office   I discussed the limitations, risks, security and privacy concerns of performing an evaluation and management service by telephone and the availability of in person appointments. I also discussed with the patient that there may be a patient responsible charge related to this service. The patient expressed understanding and agreed to proceed.   History of Present Illness: Patient is evaluated by phone session.  She endorses increased nervousness lately because landlord increased the rent and she cannot afford the rent.  She is actively looking but so far did not find a place to move out.  She does not want to move but the daughter who now lives in [place] and had her phone tabs.  She feels the medicine is working but recently she noticed some weight loss.  She had lost more than 20 pounds in past 3 months.  She had called multiple times to her 28 office but so far has not able to get appointment.  Patient is frustrated because she is seeing residence soon graduated and so far she has not assigned a new physician.  She denies any mania, psychosis, hallucination but admitted sometimes frustration and irritability.  She has no tremors or shakes.  She like to keep her Depakote, mirtazapine and Wellbutrin.   Past Psychiatric History:Review[facility] [place] [facility] TriedPaxil,Prozac, vistaril andZoloft. Noh/osuicidal attempt or any psychosis.   Psychiatric Specialty Exam: Physical Exam  Review of Systems  Weight 120 lb (54.4 kg).There is no height or weight on file to calculate BMI.  General Appearance: NA  Eye Contact:  NA   Speech:  Slow  Volume:  Decreased  Mood:  Anxious  Affect:  NA  Thought Process:  Goal Directed  Orientation:  Full (Time, Place, and Person)  Thought Content:  Rumination  Suicidal Thoughts:  No  Homicidal Thoughts:  No  Memory:  Immediate;   Good Recent;   Good Remote;   Good  Judgement:  Intact  Insight:  Present  Psychomotor Activity:  NA  Concentration:  Concentration: Fair and Attention Span: Fair  Recall:  Good  Fund of Knowledge:  Good  Language:  Good  Akathisia:  No  Handed:  Right  AIMS (if indicated):     Assets:  Communication Skills Desire for Improvement  ADL's:  Intact  Cognition:  WNL  Sleep:         Assessment and Plan: Major depressive disorder, recurrent.  Anxiety.  Discuss living situation.  Patient looking for a new place to live since landlord increased the rent.  She does not want to change medication but like to have appointment with PCP for blood work and physical.  Her previous physician is graduated and she has not assigned a new resident/PCP.  Discussed medication side effects and benefits.  I will continue Depakote 750 mg at bedtime, Wellbutrin XL 300 mg daily and Remeron 30 mg at bedtime.  We will forward note to her primary resident.  She will need a Depakote level which can be done with her annual blood work.  Recommended to call us back if there is any question or any concern.  Follow-up in 3 months.    Follow Up Instructions:  I discussed the assessment and treatment plan with the patient. The patient was provided an opportunity to ask questions and all were answered. The patient agreed with the plan and demonstrated an understanding of the instructions.   The patient was advised to call back or seek an in-person evaluation if the symptoms worsen or if the condition fails to improve as anticipated.  I provided 15 minutes of non-face-to-face time during this encounter.   Kathlee Nations, MD

## 2020-06-27 ENCOUNTER — Ambulatory Visit: Payer: 59 | Admitting: Family Medicine

## 2020-06-27 NOTE — Progress Notes (Deleted)
    SUBJECTIVE:   CHIEF COMPLAINT / HPI:   ***  PERTINENT  PMH / PSH: ***  OBJECTIVE:   There were no vitals taken for this visit.  ***  ASSESSMENT/PLAN:   No problem-specific Assessment & Plan notes found for this encounter.     Shada Nienaber, MD [facility]  

## 2020-07-03 ENCOUNTER — Other Ambulatory Visit: Payer: Self-pay | Admitting: *Deleted

## 2020-07-03 MED ORDER — METFORMIN HCL ER 500 MG PO TB24
ORAL_TABLET | ORAL | 0 refills | Status: DC
Start: 2020-07-03 — End: 2020-10-01

## 2020-07-05 ENCOUNTER — Ambulatory Visit (INDEPENDENT_AMBULATORY_CARE_PROVIDER_SITE_OTHER): Payer: 59 | Admitting: Sports Medicine

## 2020-07-05 ENCOUNTER — Encounter: Payer: Self-pay | Admitting: Sports Medicine

## 2020-07-05 ENCOUNTER — Ambulatory Visit (INDEPENDENT_AMBULATORY_CARE_PROVIDER_SITE_OTHER): Payer: 59

## 2020-07-05 ENCOUNTER — Other Ambulatory Visit: Payer: Self-pay

## 2020-07-05 DIAGNOSIS — B351 Tinea unguium: Secondary | ICD-10-CM

## 2020-07-05 DIAGNOSIS — S92501A Displaced unspecified fracture of right lesser toe(s), initial encounter for closed fracture: Secondary | ICD-10-CM

## 2020-07-05 DIAGNOSIS — E119 Type 2 diabetes mellitus without complications: Secondary | ICD-10-CM | POA: Diagnosis not present

## 2020-07-05 DIAGNOSIS — M79675 Pain in left toe(s): Secondary | ICD-10-CM

## 2020-07-05 DIAGNOSIS — M79674 Pain in right toe(s): Secondary | ICD-10-CM | POA: Diagnosis not present

## 2020-07-05 NOTE — Progress Notes (Signed)
Subjective: [name] is a 69 y.o. female patient who presents to office for evaluation of Right 3rd toe pain. Patient complains of progressive pain especially over the last 2 months thinks she hit it and the toe and the nail area is painful with worse pain after shoes and at night, reports that she went to a wedding and wore dress shoes and had pain ever since. Denies redness, warmth, drainage, or any signs of infection. No other issues noted.   Patient Active Problem List   Diagnosis Date Noted  . Cellulitis of left upper extremity 02/09/2020  . Allergy to environmental factors 02/09/2020  . Irritant contact dermatitis due to detergent 11/22/2019  . Plantar wart of right foot 09/14/2019  . Mixed urge and stress incontinence 09/07/2018  . Dizziness 09/07/2018  . Vaginal irritation 07/15/2018  . Well controlled type 2 diabetes mellitus ([place]) 02/24/2018  . HSV-2 (herpes simplex virus 2) infection 02/29/2016  . Seborrheic keratoses, inflamed 02/29/2016  . Itching in the vaginal area 05/17/2015  . Cough 10/02/2014  . Allergic rhinitis 04/25/2014  . Skin lesions 12/08/2013  . Major depressive disorder, recurrent episode, severe, without mention of psychotic behavior 09/08/2012  . Healthcare maintenance 06/13/2012  . Vitamin D deficiency 06/13/2012  . Sleep difficulties 06/13/2012  . Hyperlipidemia - Mixed 05/17/2012  . Migraine 01/20/2012  . COPD (chronic obstructive pulmonary disease) ([place]) 03/05/2011  . Bilateral carpal tunnel syndrome 10/27/2010  . History of tobacco abuse 08/18/2008  . BACK PAIN WITH RADICULOPATHY 08/17/2008  . Essential hypertension, benign 11/05/2007  . GERD 11/05/2007    Current Outpatient Medications on File Prior to Visit  Medication Sig Dispense Refill  . albuterol (PROAIR HFA) 108 (90 Base) MCG/ACT inhaler Inhale 2 puffs as directed into the lungs. 1 Inhaler 3  . amLODipine (NORVASC) 10 MG tablet Take 1 tablet (10 mg total) by mouth at  bedtime. 90 tablet 3  . atorvastatin (LIPITOR) 80 MG tablet Take 1 tablet (80 mg total) by mouth daily. 90 tablet 3  . buPROPion (WELLBUTRIN XL) 300 MG 24 hr tablet Take 1 tablet (300 mg total) by mouth every morning. 90 tablet 0  . cetirizine (ZYRTEC) 10 MG tablet Take 1 tablet (10 mg total) by mouth daily. 30 tablet 11  . clobetasol ointment (TEMOVATE) 0.05 % APPLY EXTERNALLY TO THE AFFECTED AREA TWICE DAILY    . cloNIDine (CATAPRES) 0.1 MG tablet Take 0.1 mg by mouth at bedtime.    . cloNIDine (CATAPRES) 0.2 MG tablet Take 0.2 mg by mouth 2 (two) times daily.    . clotrimazole (LOTRIMIN) 1 % cream Apply 1 application topically 2 (two) times daily. 60 g 0  . divalproex (DEPAKOTE) 250 MG DR tablet Take 3 tablets (750 mg total) by mouth at bedtime. 270 tablet 0  . fluconazole (DIFLUCAN) 150 MG tablet TAKE 1 TABLET(150 MG) BY MOUTH 1 TIME 1 tablet 0  . fluticasone (FLONASE) 50 MCG/ACT nasal spray Place 2 sprays into both nostrils daily. 16 g 0  . hydrochlorothiazide (HYDRODIURIL) 12.5 MG tablet Take 1 tablet (12.5 mg total) daily by mouth. 30 tablet 5  . latanoprost (XALATAN) 0.005 % ophthalmic solution 1 drop at bedtime.    . lidocaine (XYLOCAINE) 2 % solution Use as directed 20 mLs as needed in the mouth or throat for mouth pain. 100 mL 0  . linaclotide (LINZESS) 290 MCG CAPS capsule TAKE 1 CAPSULE BY MOUTH DAILY BEFORE BREAKFAST 90 capsule 0  . loratadine (CLARITIN REDITABS) 10 MG dissolvable tablet  Take 1 tablet (10 mg total) by mouth daily. As needed for allergy symptoms 31 tablet 12  . losartan (COZAAR) 25 MG tablet TAKE 1 TABLET(25 MG) BY MOUTH DAILY 30 tablet 11  . metFORMIN (GLUCOPHAGE) 500 MG tablet Take 500 mg by mouth 2 (two) times daily.    . metFORMIN (GLUCOPHAGE-XR) 500 MG 24 hr tablet TAKE 1 TABLET(500 MG) BY MOUTH TWICE DAILY 180 tablet 0  . mirtazapine (REMERON) 30 MG tablet TAKE 1 TABLET(30 MG) BY MOUTH AT BEDTIME 90 tablet 0  . mupirocin ointment (BACTROBAN) 2 % APPLY  EXTERNALLY TO THE AFFECTED AREA THREE TIMES DAILY    . pantoprazole (PROTONIX) 40 MG tablet Take 1 tablet (40 mg total) by mouth daily. MUST KEEP APPT FOR FURTHER REFILLS 90 tablet 0  . potassium chloride SA (K-DUR,KLOR-CON) 20 MEQ tablet Take 1 tablet (20 mEq total) daily by mouth. 30 tablet 0  . propranolol (INDERAL) 40 MG tablet Take 1 tablet (40 mg total) by mouth 2 (two) times daily. 60 tablet 11  . triamcinolone ointment (KENALOG) 0.5 % Apply 1 application topically 2 (two) times daily. Do not use for more than 1 week at a time. 60 g 3  . valACYclovir (VALTREX) 500 MG tablet Take 1 tablet (500 mg total) by mouth 2 (two) times daily. x5 days as needed for flares 30 tablet 0   No current facility-administered medications on file prior to visit.    Allergies  Allergen Reactions  . Aspirin Other (See Comments)    Dr told pt not to take asa d/t bleeding ulcer in 1985.  [name] Penicillins Nausea Only    Tablets only.  Denies any airway involvement.    . Latex Rash    Relieved with over the counter medications.    Objective:  General: Alert and oriented x3 in no acute distress  Dermatology: No open lesions bilateral lower extremities, no webspace macerations, no ecchymosis bilateral, all nails x 10 are thickening and elongated consistent with onychomycosis.   Vascular: Minimal focal edema noted to right 3rd toe, Dorsalis Pedis and Posterior Tibial pedal pulses 1/4, Capillary Fill Time 3 seconds,(+) pedal hair growth bilateral, Temperature gradient within normal limits.  Neurology: Johney Maine sensation intact via light touch bilateral.   Musculoskeletal: There is tenderness with palpation at right 3rd toe at DIPJ. Strength within normal limits in all groups bilateral.   Xrays  Right foot   Impression: Mild hammertoe ? Stress fracture at distal phalanx 3rd toe and intact Calcaneal screws    Assessment and Plan: Problem List Items Addressed This Visit    None    Visit Diagnoses    Closed  fracture of phalanx of lesser toe of right foot, physeal involvement unspecified, unspecified phalanx, initial encounter    -  Primary   Relevant Orders   DG Foot Complete Right (Completed)   Toe pain, right       Pain due to onychomycosis of toenails of both feet       Diabetes mellitus without complication (HCC)       Relevant Medications   metFORMIN (GLUCOPHAGE) 500 MG tablet       -Complete examination performed -Xrays reviewed -Discussed treatement options for fracture; risks, alternatives, and benefits explained. -Applied toe splint right 3rd toe using coban and advised patient to do the same daily for the next 2-3 weeks -Advised good supportive shoes -Recommend protection, rest, ice, elevation daily until symptoms improve -Mechanically debrided nails x 10 using sterile nail nipper without incident -  Patient to return to office in 3-4 weeks for serial x-rays to assess healing or sooner if condition worsens.  Landis Martins, DPM

## 2020-07-06 ENCOUNTER — Other Ambulatory Visit: Payer: Self-pay | Admitting: Sports Medicine

## 2020-07-06 ENCOUNTER — Telehealth: Payer: Self-pay

## 2020-07-06 ENCOUNTER — Telehealth: Payer: Self-pay | Admitting: Sports Medicine

## 2020-07-06 MED ORDER — TRAMADOL HCL 50 MG PO TABS: 50.0000 mg | ORAL_TABLET | Freq: Three times a day (TID) | ORAL | 0 refills | Status: AC | PRN

## 2020-07-06 NOTE — Telephone Encounter (Signed)
I already sent it earlier today

## 2020-07-06 NOTE — Telephone Encounter (Signed)
Pt called and needs pain medication for her toe. Please advise.

## 2020-07-06 NOTE — Progress Notes (Signed)
Sent Tramadol for pain  °

## 2020-07-06 NOTE — Telephone Encounter (Signed)
Sent tramadol for toe pain

## 2020-07-06 NOTE — Telephone Encounter (Signed)
Pt would like something for pain because her toe is really bothering. She would like it sent to the [facility] in [place].

## 2020-07-11 ENCOUNTER — Other Ambulatory Visit (HOSPITAL_COMMUNITY): Payer: Self-pay | Admitting: Psychiatry

## 2020-07-11 DIAGNOSIS — F330 Major depressive disorder, recurrent, mild: Secondary | ICD-10-CM

## 2020-07-12 ENCOUNTER — Ambulatory Visit: Payer: 59 | Admitting: Family Medicine

## 2020-07-13 ENCOUNTER — Other Ambulatory Visit: Payer: Self-pay

## 2020-07-13 MED ORDER — LOSARTAN POTASSIUM 25 MG PO TABS
ORAL_TABLET | ORAL | 11 refills | Status: DC
Start: 2020-07-13 — End: 2020-09-06

## 2020-07-25 ENCOUNTER — Ambulatory Visit: Payer: 59 | Admitting: Family Medicine

## 2020-07-26 ENCOUNTER — Ambulatory Visit: Payer: 59 | Admitting: Sports Medicine

## 2020-08-01 ENCOUNTER — Encounter: Payer: Self-pay | Admitting: Internal Medicine

## 2020-08-16 ENCOUNTER — Encounter: Payer: 59 | Admitting: Internal Medicine

## 2020-08-30 ENCOUNTER — Other Ambulatory Visit: Payer: Self-pay | Admitting: Internal Medicine

## 2020-09-02 NOTE — Patient Instructions (Signed)
Thank you for coming to see me today. It was a pleasure.   You should pay attention to your hemoglobin A1C.  It is a three month test about your average blood sugar. If the A1C is - <7.0 is great.  That is our goal for treating you. - Between 7.0 and 9.0 is not so good.  We would need to work to do better. - Above 9.0 is terrible.  You would really need to work with [place] to get it under control.    Today's A1C = 6.8  You need to start taking you Clonidine once in the morning and once in the evening.  Take your losartan and norvasc as you normally do.  Check you blood pressure in the morning and in the evening and record them in a book.  Bring in the readings at your next visit so I can review them with you.  I have refilled your medications and ordered a blood pressure machine.  Please follow-up with 1 week  If you have any questions or concerns, please do not hesitate to call the office at [phone].  Best,   Carollee Leitz, MD [facility]

## 2020-09-02 NOTE — Progress Notes (Signed)
    SUBJECTIVE:   CHIEF COMPLAINT / HPI: f/u diabetes  Fasting checks: Doesn't check at home Compliance: daily  Diet: has been eating more carbs lately Exercise: minimal Eye exam: 06/12/20 Foot exam: follow with podiatry A1C: 6.8 Symptoms: No symptoms of hypoglycemia. No symptoms of  polyuria, polydipsia. No numbness in extremities, and foot ulcers/trauma Meds: Metformin 500 BID Pneumonia vaccine: no longer needed   HTN Elevated today. Has not taken Clonidine today. She reports that her BP is normally in this range at home.  She takes 0.66m Clonidine at night.  Reports some intermittent dizziness.  Denies any chest pain, SOB, headaches, or blurry vision.   Monitoring Labs and Parameters Last A1C:  Lab Results  Component Value Date   HGBA1C 6.8 09/06/2020   Last Lipid:     Component Value Date/Time   CHOL 253 (H) 02/08/2020 1652   HDL 42 02/08/2020 1652   LDLDIRECT 111 (H) 07/07/2013 1336   Last Bmet  Potassium  Date Value Ref Range Status  09/06/2020 4.3 3.5 - 5.2 mmol/L Final   Sodium  Date Value Ref Range Status  09/06/2020 143 134 - 144 mmol/L Final   Creat  Date Value Ref Range Status  09/04/2016 1.07 (H) 0.50 - 0.99 mg/dL Final    Comment:      For patients > or = 70years of age: The upper reference limit for Creatinine is approximately 13% higher for people identified as African-American.      Creatinine, Ser  Date Value Ref Range Status  09/06/2020 0.94 0.57 - 1.00 mg/dL Final      PERTINENT  PMH / PSH:   OBJECTIVE:   BP (!) 160/88   Pulse 71   Ht _0  (1.549 m)   Wt 155 lb 9.6 oz (70.6 kg)   SpO2 99%   BMI 29.40 kg/m   General: Alert and oriented, no apparent distress  Cardiovascular: RRR with no murmurs noted, no lower extremity edema Respiratory: CTA bilaterally  Gastrointestinal: Bowel sounds present. No abdominal pain   ASSESSMENT/PLAN:   Well controlled type 2 diabetes mellitus [place] controlled.  No hypoglycemic  events.  LDL elevated 173 on Lipitor 816m -HbA1c 6.8 -Bmet today -Continue Metformin -Continue Atorvastatin -Start Zetia 10 mg daily -Repeat HbA1c 3-6 months Repeat Lipid panel 6 months  Essential hypertension, benign Initial BP 170/80.  Repeat 160/88.  Asymptomatic.  Has not taken Antihypertensives today.  Could be rebound effect from Clonidine. -Will have patient take medications as directed.  Clonidine 0.2 mg twice daily Losartan 25 g daily Amlodipine 10 mg daily Hydrochlorothiazide 12.5 mg daily -BP kit ordered, patient will monitor at home and record readings.  We will review at next visit. -Follow up in 1 week -Consider 24hr BP monitoring if BP remains elevated.  May need to increase HCTZ at next visit.     TaCarollee LeitzMD [place]

## 2020-09-06 ENCOUNTER — Other Ambulatory Visit: Payer: Self-pay | Admitting: Family Medicine

## 2020-09-06 ENCOUNTER — Other Ambulatory Visit: Payer: Self-pay

## 2020-09-06 ENCOUNTER — Encounter: Payer: Self-pay | Admitting: Family Medicine

## 2020-09-06 ENCOUNTER — Ambulatory Visit (INDEPENDENT_AMBULATORY_CARE_PROVIDER_SITE_OTHER): Payer: 59 | Admitting: Family Medicine

## 2020-09-06 VITALS — BP 160/88 | HR 71 | Ht 61.0 in | Wt 155.6 lb

## 2020-09-06 DIAGNOSIS — I10 Essential (primary) hypertension: Secondary | ICD-10-CM

## 2020-09-06 DIAGNOSIS — Z9109 Other allergy status, other than to drugs and biological substances: Secondary | ICD-10-CM

## 2020-09-06 DIAGNOSIS — E119 Type 2 diabetes mellitus without complications: Secondary | ICD-10-CM

## 2020-09-06 LAB — POCT GLYCOSYLATED HEMOGLOBIN (HGB A1C): HbA1c, POC (controlled diabetic range): 6.8 % (ref 0.0–7.0)

## 2020-09-06 MED ORDER — ATORVASTATIN CALCIUM 80 MG PO TABS
80.0000 mg | ORAL_TABLET | Freq: Every day | ORAL | 3 refills | Status: DC
Start: 2020-09-06 — End: 2021-02-22

## 2020-09-06 MED ORDER — PROPRANOLOL HCL 40 MG PO TABS
40.0000 mg | ORAL_TABLET | Freq: Two times a day (BID) | ORAL | 11 refills | Status: DC
Start: 2020-09-06 — End: 2021-11-20

## 2020-09-06 MED ORDER — AMLODIPINE BESYLATE 10 MG PO TABS
10.0000 mg | ORAL_TABLET | Freq: Every day | ORAL | 3 refills | Status: DC
Start: 2020-09-06 — End: 2021-02-22

## 2020-09-06 MED ORDER — LOSARTAN POTASSIUM 25 MG PO TABS: ORAL_TABLET | ORAL | 11 refills | Status: DC

## 2020-09-06 MED ORDER — HYDROCHLOROTHIAZIDE 12.5 MG PO TABS: 12.5000 mg | ORAL_TABLET | Freq: Every day | ORAL | 5 refills | Status: DC

## 2020-09-06 MED ORDER — EZETIMIBE 10 MG PO TABS: 10.0000 mg | ORAL_TABLET | Freq: Every day | ORAL | 3 refills | Status: DC

## 2020-09-06 MED ORDER — CLONIDINE HCL 0.2 MG PO TABS
0.2000 mg | ORAL_TABLET | Freq: Two times a day (BID) | ORAL | 3 refills | Status: DC
Start: 2020-09-06 — End: 2021-01-25

## 2020-09-06 MED ORDER — BLOOD PRESSURE KIT: PACK | 0 refills | Status: DC

## 2020-09-06 MED ORDER — FLUTICASONE PROPIONATE 50 MCG/ACT NA SUSP: 2.0000 | Freq: Every day | NASAL | 0 refills | Status: DC

## 2020-09-07 LAB — BASIC METABOLIC PANEL
BUN/Creatinine Ratio: 11 — ABNORMAL LOW (ref 12–28)
BUN: 10 mg/dL (ref 8–27)
CO2: 23 mmol/L (ref 20–29)
Calcium: 10.5 mg/dL — ABNORMAL HIGH (ref 8.7–10.3)
Chloride: 105 mmol/L (ref 96–106)
Creatinine, Ser: 0.94 mg/dL (ref 0.57–1.00)
GFR calc Af Amer: 71 mL/min/{1.73_m2} (ref >59)
GFR calc non Af Amer: 62 mL/min/{1.73_m2} (ref >59)
Glucose: 116 mg/dL — ABNORMAL HIGH (ref 65–99)
Potassium: 4.3 mmol/L (ref 3.5–5.2)
Sodium: 143 mmol/L (ref 134–144)

## 2020-09-08 ENCOUNTER — Encounter: Payer: Self-pay | Admitting: Family Medicine

## 2020-09-08 NOTE — Assessment & Plan Note (Signed)
Initial BP 170/80.  Repeat 160/88.  Asymptomatic.  Has not taken Antihypertensives today.  Could be rebound effect from Clonidine. -Will have patient take medications as directed.  Clonidine 0.2 mg twice daily Losartan 25 g daily Amlodipine 10 mg daily Hydrochlorothiazide 12.5 mg daily -BP kit ordered, patient will monitor at home and record readings.  We will review at next visit. -Follow up in 1 week -Consider 24hr BP monitoring if BP remains elevated.  May need to increase HCTZ at next visit.

## 2020-09-08 NOTE — Assessment & Plan Note (Signed)
Well controlled.  No hypoglycemic events.  LDL elevated 173 on Lipitor 80mg . -HbA1c 6.8 -Bmet today -Continue Metformin -Continue Atorvastatin -Start Zetia 10 mg daily -Repeat HbA1c 3-6 months Repeat Lipid panel 6 months

## 2020-09-10 ENCOUNTER — Ambulatory Visit: Payer: 59 | Admitting: Family Medicine

## 2020-09-12 ENCOUNTER — Encounter (HOSPITAL_COMMUNITY): Payer: Self-pay | Admitting: Psychiatry

## 2020-09-12 ENCOUNTER — Telehealth (INDEPENDENT_AMBULATORY_CARE_PROVIDER_SITE_OTHER): Payer: 59 | Admitting: Psychiatry

## 2020-09-12 ENCOUNTER — Other Ambulatory Visit: Payer: Self-pay

## 2020-09-12 DIAGNOSIS — F330 Major depressive disorder, recurrent, mild: Secondary | ICD-10-CM | POA: Diagnosis not present

## 2020-09-12 DIAGNOSIS — F419 Anxiety disorder, unspecified: Secondary | ICD-10-CM

## 2020-09-12 MED ORDER — BUPROPION HCL ER (XL) 300 MG PO TB24: 300.0000 mg | ORAL_TABLET | ORAL | 0 refills | Status: DC

## 2020-09-12 MED ORDER — DIVALPROEX SODIUM 250 MG PO DR TAB: 750.0000 mg | DELAYED_RELEASE_TABLET | Freq: Every day | ORAL | 0 refills | Status: DC

## 2020-09-12 MED ORDER — MIRTAZAPINE 30 MG PO TABS: ORAL_TABLET | ORAL | 0 refills | Status: DC

## 2020-09-12 NOTE — Progress Notes (Signed)
Virtual Visit via Telephone Note  I connected with [name] on 09/12/20 at  3:20 PM EST by telephone and verified that I am speaking with the correct person using two identifiers.  Location: Patient: home Provider: home office   I discussed the limitations, risks, security and privacy concerns of performing an evaluation and management service by telephone and the availability of in person appointments. I also discussed with the patient that there may be a patient responsible charge related to this service. The patient expressed understanding and agreed to proceed.   History of Present Illness: Patient is evaluated by phone session.  She is living with her daughter in [place] because medical to find a house in [place].  She is trying to get on [address] but list is long.  She feels the current medicine is working but sometimes she do get anxious about her general health.  She is taking multiple medication for blood pressure and she is continued to have difficulty controlling her blood pressure.  Lately her blood sugar was also high.  She is not sure what causing sugar and blood pressure too high.  She had a good support from her daughter.  She is happy with her current living situation.  She denies any anger, agitation, mania, psychosis, crying spells.  She is excited about Thanksgiving.  She wants to cope and she is going to cook for Thanksgiving.  Her energy level is okay.  Her appetite is okay.  Her weight is stable.  Past Psychiatric History:Reviewed. [facility] [place] [facility]. TriedPaxil,Prozac, vistaril andZoloft. Noh/osuicidal attempt or any psychosis.  Recent Results (from the past 2160 hour(s))  HgB A1c     Status: None   Collection Time: 09/06/20  1:42 PM  Result Value Ref Range   Hemoglobin A1C     HbA1c POC (<> result, manual entry)      HbA1c, POC (prediabetic range)     HbA1c, POC (controlled diabetic range) 6.8 0.0 - 7.0 %  Basic Metabolic Panel     Status: Abnormal   Collection Time: 09/06/20  2:54 PM  Result Value Ref Range   Glucose 116 (H) 65 - 99 mg/dL   BUN 10 8 - 27 mg/dL   Creatinine, Ser 0.94 0.57 - 1.00 mg/dL   GFR calc non Af Amer 62 >59 mL/min/1.73   GFR calc Af Amer 71 >59 mL/min/1.73    Comment: **In accordance with recommendations from the NKF-ASN Task force,**   [facility] is in the process of updating its eGFR calculation to the   2021 CKD-EPI creatinine equation that estimates kidney function   without a race variable.    BUN/Creatinine Ratio 11 (L) 12 - 28   Sodium 143 134 - 144 mmol/L   Potassium 4.3 3.5 - 5.2 mmol/L   Chloride 105 96 - 106 mmol/L   CO2 23 20 - 29 mmol/L   Calcium 10.5 (H) 8.7 - 10.3 mg/dL    Psychiatric Specialty Exam: Physical Exam  Review of Systems  Weight 155 lb (70.3 kg).There is no height or weight on file to calculate BMI.  General Appearance: NA  Eye Contact:  NA  Speech:  Clear and Coherent  Volume:  Normal  Mood:  Anxious  Affect:  NA  Thought Process:  Goal Directed  Orientation:  Full (Time, Place, and Person)  Thought Content:  WDL  Suicidal Thoughts:  No  Homicidal Thoughts:  No  Memory:  Immediate;   Good Recent;   Good Remote;   Good  Judgement:  Intact  Insight:  Present  Psychomotor Activity:  NA  Concentration:  Concentration: Fair and Attention Span: Fair  Recall:  Good  Fund of Knowledge:  Good  Language:  Good  Akathisia:  No  Handed:  Right  AIMS (if indicated):     Assets:  Communication Skills Desire for Improvement Housing Resilience Social Support  ADL's:  Intact  Cognition:  WNL  Sleep:   ok      Assessment and Plan: Major depressive disorder, recurrent.  Anxiety.  I reviewed blood work results.  We have asked Depakote level from her primary care physician but we did not receive any results and it was only hemoglobin A1c  and other tests.  Her hemoglobin A1c is 6.8.  Patient like to have a blood work at her PCP office.  She is seeing Dr.Wash who is a resident.  I will send 1 more request to see if she can have a Depakote level there.  Patient does not want to change the medication.  Continue Wellbutrin XL 300 mg daily, mirtazapine 30 mg at bedtime and Depakote 750 mg at bedtime.  Recommended to call us back if she is any question or any concern.  Follow-up in 3 months.  Follow Up Instructions:    I discussed the assessment and treatment plan with the patient. The patient was provided an opportunity to ask questions and all were answered. The patient agreed with the plan and demonstrated an understanding of the instructions.   The patient was advised to call back or seek an in-person evaluation if the symptoms worsen or if the condition fails to improve as anticipated.  I provided 18 minutes of non-face-to-face time during this encounter.   Kathlee Nations, MD

## 2020-09-13 ENCOUNTER — Ambulatory Visit (AMBULATORY_SURGERY_CENTER): Payer: Self-pay

## 2020-09-13 ENCOUNTER — Other Ambulatory Visit: Payer: Self-pay

## 2020-09-13 VITALS — Ht 61.0 in | Wt 153.0 lb

## 2020-09-13 DIAGNOSIS — Z86010 Personal history of colonic polyps: Secondary | ICD-10-CM

## 2020-09-13 MED ORDER — SUTAB 1479-225-188 MG PO TABS: 1.0000 | ORAL_TABLET | ORAL | 0 refills | Status: DC

## 2020-09-13 NOTE — Progress Notes (Signed)
No egg or soy allergy known to patient  No issues with past sedation with any surgeries or procedures No intubation problems in the past  No FH of Malignant Hyperthermia No diet pills per patient No home 02 use per patient  No blood thinners per patient  Pt denies issues with constipation  No A fib or A flutter  EMMI video via MyChart  COVID 19 guidelines implemented in PV today with Pt and RN  Coupon given to pt in PV today , Code to Pharmacy  COVID vaccines completed on 03/2020 per pt;  Due to the COVID-19 pandemic we are asking patients to follow these guidelines. Please only bring one care partner. Please be aware that your care partner may wait in the car in the parking lot or if they feel like they will be too hot to wait in the car, they may wait in the lobby on the 4th floor. All care partners are required to wear a mask the entire time (we do not have any that we can provide them), they need to practice social distancing, and we will do a Covid check for all patient's and care partners when you arrive. Also we will check their temperature and your temperature. If the care partner waits in their car they need to stay in the parking lot the entire time and we will call them on their cell phone when the patient is ready for discharge so they can bring the car to the front of the building. Also all patient's will need to wear a mask into building.  

## 2020-09-14 ENCOUNTER — Other Ambulatory Visit: Payer: Self-pay

## 2020-09-14 ENCOUNTER — Ambulatory Visit (INDEPENDENT_AMBULATORY_CARE_PROVIDER_SITE_OTHER): Payer: 59 | Admitting: Family Medicine

## 2020-09-14 VITALS — BP 162/94 | HR 76 | Ht 61.0 in | Wt 151.8 lb

## 2020-09-14 DIAGNOSIS — Z23 Encounter for immunization: Secondary | ICD-10-CM | POA: Diagnosis not present

## 2020-09-14 DIAGNOSIS — I10 Essential (primary) hypertension: Secondary | ICD-10-CM

## 2020-09-14 MED ORDER — SPIRONOLACTONE 25 MG PO TABS: 25.0000 mg | ORAL_TABLET | Freq: Every day | ORAL | 3 refills | Status: DC

## 2020-09-14 MED ORDER — BLOOD PRESSURE KIT DEVI: 0 refills | Status: DC

## 2020-09-14 NOTE — Progress Notes (Signed)
    SUBJECTIVE:   CHIEF COMPLAINT / HPI:   HTN: Patient took all of her scheduled medications this morning and last night.  Was taking clonidine only at night.  Taking it twice a day now.  Also taking amlodipine, HCTZ, losartan, propranolol for migraines.  Patient is okay with starting a new blood pressure medication.  She is not taking the potassium supplementation that is listed in her med list.  She does not have a history of heart failure.    PERTINENT  PMH / PSH: HTN  OBJECTIVE:   BP (!) 162/94   Pulse 76   Ht 5\' 1"  (1.549 m)   Wt 151 lb 12.8 oz (68.9 kg)   SpO2 96%   BMI 28.68 kg/m   General: Alert and oriented.  No acute distress CV: Regular rate and rhythm, no murmurs.  2+ radial pulse Pulmonary: Lungs are auscultation bilaterally Extremities: No pitting edema in the lower extremities  ASSESSMENT/PLAN:   Essential hypertension, benign .  Still elevated.  Will start spironolactone 25 mg.  Advised patient to stop all potassium supplements (patient says she was not take any at this time but it was on her med list).  Advised patient to follow-up in 1 week so that we can get a repeat BMP and titrate medications as needed.  Ultimate goal would be to titrate off of clonidine.     Benay Pike, MD [place]

## 2020-09-14 NOTE — Patient Instructions (Signed)
It was nice to meet you today,  I have started a medication called spironolactone.  I want you to take this medication once a day.  You can take it when you take the other medications for blood pressure  I want you to make sure you are not taking any medication called K-Dur or Klor-Con or any other kind potassium supplement as both of these medications can increase potassium levels.  I want you to continue all of your other medications as of right now, but we will follow up with you in 1 week so we can get another blood test and possibly titrate up the medication if necessary.  I will put in an order for the blood pressure monitor.  Please let us know if you have not received it.  Have a great day,  Clemetine Marker, MD

## 2020-09-14 NOTE — Assessment & Plan Note (Signed)
.    Still elevated.  Will start spironolactone 25 mg.  Advised patient to stop all potassium supplements (patient says she was not take any at this time but it was on her med list).  Advised patient to follow-up in 1 week so that we can get a repeat BMP and titrate medications as needed.  Ultimate goal would be to titrate off of clonidine.

## 2020-09-27 ENCOUNTER — Ambulatory Visit: Payer: 59 | Admitting: Sports Medicine

## 2020-09-30 ENCOUNTER — Other Ambulatory Visit: Payer: Self-pay | Admitting: Family Medicine

## 2020-10-02 ENCOUNTER — Other Ambulatory Visit: Payer: Self-pay | Admitting: Internal Medicine

## 2020-10-02 ENCOUNTER — Telehealth: Payer: Self-pay | Admitting: Internal Medicine

## 2020-10-02 NOTE — Telephone Encounter (Signed)
Hi Dr. Hilarie Fredrickson, this pt just cancelled her colonoscopy that was scheduled on 10/04/20 due to lack of transportation. She rescheduled to 11/29/20 at 11:30am. Thank you

## 2020-10-02 NOTE — Telephone Encounter (Signed)
Pt r/s her procedure from 12/9. She stated that she needs another prep. She also stated that her insurance does not cover Sutab.

## 2020-10-02 NOTE — Telephone Encounter (Signed)
Last Pv will be more than 60 days- called pt- sch PV for 1-20 at 130 pm - colon 11-29-2020

## 2020-10-04 ENCOUNTER — Encounter: Payer: 59 | Admitting: Internal Medicine

## 2020-10-10 ENCOUNTER — Ambulatory Visit (INDEPENDENT_AMBULATORY_CARE_PROVIDER_SITE_OTHER): Payer: 59

## 2020-10-10 ENCOUNTER — Ambulatory Visit (INDEPENDENT_AMBULATORY_CARE_PROVIDER_SITE_OTHER): Payer: 59 | Admitting: Podiatry

## 2020-10-10 ENCOUNTER — Other Ambulatory Visit: Payer: Self-pay

## 2020-10-10 ENCOUNTER — Encounter: Payer: Self-pay | Admitting: Podiatry

## 2020-10-10 DIAGNOSIS — M7750 Other enthesopathy of unspecified foot: Secondary | ICD-10-CM

## 2020-10-10 DIAGNOSIS — M7751 Other enthesopathy of right foot: Secondary | ICD-10-CM | POA: Diagnosis not present

## 2020-10-10 MED ORDER — TRIAMCINOLONE ACETONIDE 10 MG/ML IJ SUSP
10.0000 mg | Freq: Once | INTRAMUSCULAR | Status: AC
  Administered 2020-10-10: 10 mg

## 2020-10-10 NOTE — Progress Notes (Signed)
Subjective:   Patient ID: [name], female   DOB: 70 y.o.   MRN: [id number]   HPI Patient states she has developed pain on the inside of the right ankle and does not remember stepping wrong or other pathology associated with it   ROS      Objective:  Physical Exam  Neurovascular status intact with inflammation fluid over the posterior tibial peroneal urgent pressure under the medial malleolus right     Assessment:  Acute posterior tibial tendinitis right with inflammation     Plan:  H&P sterile prep done injected posterior tib 3 mg Kenalog 5 g liken reviewed x-ray applied fascial brace to lift up the arch and take stress off the arch and reappoint if symptoms persist  X-rays indicate there is screws in the calcaneus secondary to previous fracture of the heel bone with no other significant pathology

## 2020-11-01 ENCOUNTER — Other Ambulatory Visit (HOSPITAL_COMMUNITY): Payer: Self-pay | Admitting: Psychiatry

## 2020-11-01 DIAGNOSIS — F330 Major depressive disorder, recurrent, mild: Secondary | ICD-10-CM

## 2020-11-01 DIAGNOSIS — F419 Anxiety disorder, unspecified: Secondary | ICD-10-CM

## 2020-11-14 ENCOUNTER — Encounter: Payer: Self-pay | Admitting: Podiatry

## 2020-11-14 ENCOUNTER — Other Ambulatory Visit: Payer: Self-pay

## 2020-11-14 ENCOUNTER — Ambulatory Visit (INDEPENDENT_AMBULATORY_CARE_PROVIDER_SITE_OTHER): Payer: 59 | Admitting: Podiatry

## 2020-11-14 ENCOUNTER — Ambulatory Visit (INDEPENDENT_AMBULATORY_CARE_PROVIDER_SITE_OTHER): Payer: 59

## 2020-11-14 DIAGNOSIS — E119 Type 2 diabetes mellitus without complications: Secondary | ICD-10-CM

## 2020-11-14 DIAGNOSIS — Z978 Presence of other specified devices: Secondary | ICD-10-CM | POA: Diagnosis not present

## 2020-11-14 DIAGNOSIS — Q828 Other specified congenital malformations of skin: Secondary | ICD-10-CM

## 2020-11-14 NOTE — Progress Notes (Signed)
Subjective:  Patient ID: [name], female    DOB: [birth year],  MRN: [id number]  Chief Complaint  Patient presents with  . Callouses    Bilateral callus    70 y.o. female presents with the above complaint.  Patient presents with complaint of bilateral hyperkeratotic lesion to the heel/porokeratosis/benign skin lesion.  Patient states is painful to walk on.  She states that they came out of nowhere and has progressive gotten worse.  She does have a history of hyperhidrosis.  She also has history of calcaneal osteotomy with screw fixation to the right foot and this painful hardware.  She wants to know if this could be removed.  She denies any other acute complaints.   Review of Systems: Negative except as noted in the HPI. Denies N/V/F/Ch.  Past Medical History:  Diagnosis Date  . Adenomatous colon polyp   . Anxiety   . Asthma   . Back pain with radiation    radiopathy   . COPD (chronic obstructive pulmonary disease) ([place])   . Corneal abrasion, left 04/28/2013  . Depression   . GERD (gastroesophageal reflux disease)   . Hx of migraines   . Hyperlipidemia   . Hypertension    on meds  . Nicotine addiction   . PANIC ATTACK 11/05/2007  . PELVIC  PAIN 08/13/2010  . Urinary incontinence     Current Outpatient Medications:  .  albuterol (PROAIR HFA) 108 (90 Base) MCG/ACT inhaler, Inhale 2 puffs as directed into the lungs., Disp: 1 Inhaler, Rfl: 3 .  amLODipine (NORVASC) 10 MG tablet, Take 1 tablet (10 mg total) by mouth at bedtime., Disp: 90 tablet, Rfl: 3 .  atorvastatin (LIPITOR) 80 MG tablet, Take 1 tablet (80 mg total) by mouth daily., Disp: 90 tablet, Rfl: 3 .  Blood Pressure Monitoring (BLOOD PRESSURE KIT) DEVI, Please measure BP at same time each day, Disp: 1 each, Rfl: 0 .  buPROPion (WELLBUTRIN XL) 300 MG 24 hr tablet, Take 1 tablet (300 mg total) by mouth every morning., Disp: 90 tablet, Rfl: 0 .  clobetasol ointment (TEMOVATE) 0.05 %, APPLY EXTERNALLY TO THE  AFFECTED AREA TWICE DAILY, Disp: , Rfl:  .  cloNIDine (CATAPRES) 0.2 MG tablet, Take 1 tablet (0.2 mg total) by mouth 2 (two) times daily., Disp: 60 tablet, Rfl: 3 .  clotrimazole (LOTRIMIN) 1 % cream, Apply 1 application topically 2 (two) times daily., Disp: 60 g, Rfl: 0 .  divalproex (DEPAKOTE) 250 MG DR tablet, Take 3 tablets (750 mg total) by mouth at bedtime., Disp: 270 tablet, Rfl: 0 .  ezetimibe (ZETIA) 10 MG tablet, Take 1 tablet (10 mg total) by mouth daily., Disp: 90 tablet, Rfl: 3 .  fluconazole (DIFLUCAN) 150 MG tablet, TAKE 1 TABLET(150 MG) BY MOUTH 1 TIME, Disp: 1 tablet, Rfl: 0 .  fluticasone (FLONASE) 50 MCG/ACT nasal spray, SHAKE LIQUID AND USE 2 SPRAYS IN EACH NOSTRIL DAILY, Disp: 48 g, Rfl: 1 .  hydrochlorothiazide (HYDRODIURIL) 12.5 MG tablet, Take 1 tablet (12.5 mg total) by mouth daily., Disp: 30 tablet, Rfl: 5 .  latanoprost (XALATAN) 0.005 % ophthalmic solution, 1 drop at bedtime., Disp: , Rfl:  .  lidocaine (XYLOCAINE) 2 % solution, Use as directed 20 mLs as needed in the mouth or throat for mouth pain., Disp: 100 mL, Rfl: 0 .  LINZESS 290 MCG CAPS capsule, TAKE 1 CAPSULE BY MOUTH DAILY BEFORE BREAKFAST, Disp: 90 capsule, Rfl: 0 .  loratadine (CLARITIN REDITABS) 10 MG dissolvable tablet, Take 1 tablet (  10 mg total) by mouth daily. As needed for allergy symptoms, Disp: 31 tablet, Rfl: 12 .  losartan (COZAAR) 25 MG tablet, TAKE 1 TABLET(25 MG) BY MOUTH DAILY, Disp: 30 tablet, Rfl: 11 .  metFORMIN (GLUCOPHAGE) 500 MG tablet, Take 500 mg by mouth 2 (two) times daily., Disp: , Rfl:  .  metFORMIN (GLUCOPHAGE-XR) 500 MG 24 hr tablet, TAKE 1 TABLET(500 MG) BY MOUTH TWICE DAILY, Disp: 180 tablet, Rfl: 0 .  mirtazapine (REMERON) 30 MG tablet, TAKE 1 TABLET(30 MG) BY MOUTH AT BEDTIME, Disp: 90 tablet, Rfl: 0 .  mupirocin ointment (BACTROBAN) 2 %, APPLY EXTERNALLY TO THE AFFECTED AREA THREE TIMES DAILY, Disp: , Rfl:  .  pantoprazole (PROTONIX) 40 MG tablet, TAKE 1 TABLET(40 MG) BY MOUTH  DAILY, Disp: 90 tablet, Rfl: 0 .  propranolol (INDERAL) 40 MG tablet, Take 1 tablet (40 mg total) by mouth 2 (two) times daily., Disp: 60 tablet, Rfl: 11 .  Sodium Sulfate-Mag Sulfate-KCl (SUTAB) [phone] MG TABS, Take 1 kit by mouth as directed. MANUFACTURER CODES!! BIN: [id number] PCN: CN GROUP: [id number] MEMBER ID: [id number];[employer] AS SECONDARY INSURANCE ;NO PRIOR AUTHORIZATION, Disp: 24 tablet, Rfl: 0 .  spironolactone (ALDACTONE) 25 MG tablet, Take 1 tablet (25 mg total) by mouth at bedtime., Disp: 90 tablet, Rfl: 3 .  triamcinolone ointment (KENALOG) 0.5 %, Apply 1 application topically 2 (two) times daily. Do not use for more than 1 week at a time., Disp: 60 g, Rfl: 3  Social History   Tobacco Use  Smoking Status Former Smoker  . Packs/day: 0.25  . Years: 42.00  . Pack years: 10.50  . Types: Cigarettes  . Quit date: 07/06/2017  . Years since quitting: 3.3  Smokeless Tobacco Never Used    Allergies  Allergen Reactions  . Aspirin Other (See Comments)    Dr told pt not to take asa d/t bleeding ulcer in 1985.  [name] Penicillins Nausea Only    Tablets only.  Denies any airway involvement.    . Latex Rash    Relieved with over the counter medications.   Objective:  There were no vitals filed for this visit. There is no height or weight on file to calculate BMI. Constitutional Well developed. Well nourished.  Vascular Dorsalis pedis pulses palpable bilaterally. Posterior tibial pulses palpable bilaterally. Capillary refill normal to all digits.  No cyanosis or clubbing noted. Pedal hair growth normal.  Neurologic Normal speech. Oriented to person, place, and time. Epicritic sensation to light touch grossly present bilaterally.  Dermatologic  hyperkeratotic lesion with central nucleated core noted to bilateral heel.  Pain on palpation to the lesion.  No wounds noted.   Orthopedic: Normal joint ROM without pain or crepitus bilaterally. No visible deformities. No bony tenderness.    Radiographs: None Assessment:   1. Orthopedic hardware present   2. Diabetes mellitus without complication ([place])   3. Porokeratosis    Plan:  Patient was evaluated and treated and all questions answered.  Porokeratosis bilateral heel x2 -I explained to the patient the etiology of porokeratosis and worse treatment options were discussed.  Given the amount of pain that she is having I believe she will benefit from aggressive debridement of the lesion.  Patient like to proceed with the debridement of the lesion.  Using chisel blade and a handle the lesion was debrided down to healthy striated tissue.  No pinpoint bleeding noted followed by excision of the central nucleated core.  Painful retained hardware orthopedic hardware -I explained the patient the  etiology of orthopedic hardware and given that there is a pain associated with it I believe patient will benefit from removal of the hardware as is causing her to to have antalgic gait.  At this time the osteotomy appears to be well-healed. -I will discuss this further during next clinical visit  No follow-ups on file.

## 2020-11-15 ENCOUNTER — Other Ambulatory Visit (HOSPITAL_COMMUNITY): Payer: Self-pay | Admitting: Psychiatry

## 2020-11-15 ENCOUNTER — Other Ambulatory Visit: Payer: Self-pay

## 2020-11-15 ENCOUNTER — Other Ambulatory Visit: Payer: Self-pay | Admitting: Internal Medicine

## 2020-11-15 ENCOUNTER — Ambulatory Visit (AMBULATORY_SURGERY_CENTER): Payer: Self-pay | Admitting: *Deleted

## 2020-11-15 VITALS — Ht 61.0 in | Wt 154.0 lb

## 2020-11-15 DIAGNOSIS — F330 Major depressive disorder, recurrent, mild: Secondary | ICD-10-CM

## 2020-11-15 DIAGNOSIS — Z86010 Personal history of colonic polyps: Secondary | ICD-10-CM

## 2020-11-15 MED ORDER — SUPREP BOWEL PREP KIT 17.5-3.13-1.6 GM/177ML PO SOLN: 1.0000 | Freq: Once | ORAL | 0 refills | Status: DC

## 2020-11-15 MED ORDER — SUPREP BOWEL PREP KIT 17.5-3.13-1.6 GM/177ML PO SOLN: 1.0000 | Freq: Once | ORAL | 0 refills | Status: AC

## 2020-11-15 NOTE — Progress Notes (Signed)
No egg or soy allergy known to patient  No issues with past sedation with any surgeries or procedures No intubation problems in the past  No FH of Malignant Hyperthermia No diet pills per patient No home 02 use per patient  No blood thinners per patient   Pt denies issues with constipation - SHE IS ON LINZESS DAILY WITH SOFT REGULAR BM'S DAILY PER PT   No A fib or A flutter  EMMI video to pt or via [place] 19 guidelines implemented in PV today with Pt and RN  Pt is fully vaccinated  for Covid   Due to the COVID-19 pandemic we are asking patients to follow certain guidelines.  Pt aware of COVID protocols and [facility] guidelines   Pt verified name, DOB, address and insurance during PV today. Pt mailed instruction packet to included paper to complete and mail back to [facility] with addressed and stamped envelope, Emmi video, copy of consent form to read and not return, and instructions. PV completed over the phone. Pt encouraged to call with questions or issues - my chart instructions to pt as well as mailed today

## 2020-11-16 ENCOUNTER — Other Ambulatory Visit: Payer: Self-pay

## 2020-11-19 ENCOUNTER — Ambulatory Visit: Payer: 59 | Admitting: Family Medicine

## 2020-11-29 ENCOUNTER — Other Ambulatory Visit: Payer: Medicaid Other | Admitting: Internal Medicine

## 2020-11-29 ENCOUNTER — Other Ambulatory Visit (HOSPITAL_COMMUNITY): Payer: Self-pay | Admitting: Psychiatry

## 2020-11-29 DIAGNOSIS — F419 Anxiety disorder, unspecified: Secondary | ICD-10-CM

## 2020-11-29 DIAGNOSIS — F330 Major depressive disorder, recurrent, mild: Secondary | ICD-10-CM

## 2020-12-04 ENCOUNTER — Other Ambulatory Visit: Payer: Self-pay | Admitting: Family Medicine

## 2020-12-05 ENCOUNTER — Ambulatory Visit: Payer: 59 | Admitting: Podiatry

## 2020-12-06 ENCOUNTER — Telehealth (INDEPENDENT_AMBULATORY_CARE_PROVIDER_SITE_OTHER): Payer: 59 | Admitting: Psychiatry

## 2020-12-06 ENCOUNTER — Other Ambulatory Visit: Payer: Self-pay

## 2020-12-06 ENCOUNTER — Encounter (HOSPITAL_COMMUNITY): Payer: Self-pay | Admitting: Psychiatry

## 2020-12-06 DIAGNOSIS — F419 Anxiety disorder, unspecified: Secondary | ICD-10-CM | POA: Diagnosis not present

## 2020-12-06 DIAGNOSIS — F330 Major depressive disorder, recurrent, mild: Secondary | ICD-10-CM

## 2020-12-06 MED ORDER — MIRTAZAPINE 30 MG PO TABS
ORAL_TABLET | ORAL | 0 refills | Status: DC
Start: 2020-12-06 — End: 2021-03-05

## 2020-12-06 MED ORDER — BUPROPION HCL ER (XL) 300 MG PO TB24
300.0000 mg | ORAL_TABLET | ORAL | 0 refills | Status: DC
Start: 2020-12-06 — End: 2021-03-05

## 2020-12-06 NOTE — Progress Notes (Signed)
Virtual Visit via Telephone Note  I connected with [name] on 12/06/20 at  3:20 PM EST by telephone and verified that I am speaking with the correct person using two identifiers.  Location: Patient: Home Provider: Work   I discussed the limitations, risks, security and privacy concerns of performing an evaluation and management service by telephone and the availability of in person appointments. I also discussed with the patient that there may be a patient responsible charge related to this service. The patient expressed understanding and agreed to proceed.   History of Present Illness: Patient is evaluated by phone session.  She is on the phone by herself.  She is pleased because she is approved for section 8 and now moved to [place].  She lives by herself.  She feels her depression and anxiety is stable.  However she noticed her past few weeks having back pain.  She also noticed going to the bathroom in the night.  She worries if her blood sugar is high.  She did not have any recent hemoglobin A1c.  She had a visit to her PCP but she saw a different doctor.  We have recommended Depakote level but it was not done because she was seeing a different doctor.  Her blood pressure is still an issue and her PCP trying to adjust the medication to had a better control of the blood pressure.  Patient denies any crying spells, anhedonia, feeling of hopelessness.  She denies any tremors, shakes or any EPS.  She denies any panic attack.  She does not drive and usually her medicines deliver to her.  She had good holidays.  She does not want to change the medication.  Her appetite is okay and her weight is unchanged from the past.  Past Psychiatric History:Reviewed[facility] [place] [facility]. TriedPaxil,Prozac, vistaril andZoloft. Noh/osuicidal attempt or any psychosis.    Psychiatric Specialty Exam: Physical Exam  Review of Systems  Weight 155 lb (70.3 kg).There is no height or weight on file to calculate BMI.  General Appearance: NA  Eye Contact:  NA  Speech:  Clear and Coherent  Volume:  Normal  Mood:  Euthymic  Affect:  NA  Thought Process:  Goal Directed  Orientation:  Full (Time, Place, and Person)  Thought Content:  WDL  Suicidal Thoughts:  No  Homicidal Thoughts:  No  Memory:  Immediate;   Good Recent;   Good Remote;   Good  Judgement:  Good  Insight:  Present  Psychomotor Activity:  NA  Concentration:  Concentration: Good and Attention Span: Good  Recall:  Good  Fund of Knowledge:  Good  Language:  Good  Akathisia:  No  Handed:  Right  AIMS (if indicated):     Assets:  Communication Skills Desire for Improvement Housing Resilience  ADL's:  Intact  Cognition:  WNL  Sleep:   ok      Assessment and Plan: Major depressive disorder, recurrent.  Anxiety.  Patient symptoms are stable however she is concerned about her health issues especially recently back pain and going to the bathroom frequently.  I recommend to have her blood work done as patient is concerned about high blood sugar.  I also recommend she should do a Depakote level which was not done recently.  She like to have her notes forward to her PCP Pollie Friar as patient like to schedule appointment with her.  She wants to  keep the current psychotropic medication since it is working well.  Continue Wellbutrin XL 300 mg daily, mirtazapine 30 mg at bedtime and Depakote 750 mg at bedtime.  Recommended to call us back if she has any question or any concern.  Follow-up in 3 months.  Follow Up Instructions:    I discussed the assessment and treatment plan with the patient. The patient was provided an opportunity to ask questions and all were answered. The patient agreed with the plan and demonstrated an understanding of the instructions.   The patient was advised to call back or  seek an in-person evaluation if the symptoms worsen or if the condition fails to improve as anticipated.  I provided 15 minutes of non-face-to-face time during this encounter.   Kathlee Nations, MD

## 2020-12-15 ENCOUNTER — Other Ambulatory Visit: Payer: Self-pay | Admitting: Internal Medicine

## 2020-12-16 ENCOUNTER — Other Ambulatory Visit: Payer: Self-pay | Admitting: Family Medicine

## 2020-12-16 DIAGNOSIS — Z9109 Other allergy status, other than to drugs and biological substances: Secondary | ICD-10-CM

## 2020-12-28 ENCOUNTER — Ambulatory Visit: Payer: 59 | Admitting: Podiatry

## 2021-01-06 ENCOUNTER — Other Ambulatory Visit: Payer: Self-pay | Admitting: Family Medicine

## 2021-01-25 ENCOUNTER — Encounter: Payer: Self-pay | Admitting: Family Medicine

## 2021-01-25 ENCOUNTER — Encounter (HOSPITAL_COMMUNITY): Payer: 59

## 2021-01-25 ENCOUNTER — Other Ambulatory Visit: Payer: Self-pay

## 2021-01-25 ENCOUNTER — Other Ambulatory Visit (HOSPITAL_COMMUNITY): Payer: Self-pay | Admitting: Family Medicine

## 2021-01-25 ENCOUNTER — Ambulatory Visit (INDEPENDENT_AMBULATORY_CARE_PROVIDER_SITE_OTHER): Payer: 59 | Admitting: Family Medicine

## 2021-01-25 VITALS — BP 136/68 | HR 58 | Ht 61.0 in | Wt 165.2 lb

## 2021-01-25 DIAGNOSIS — R600 Localized edema: Secondary | ICD-10-CM

## 2021-01-25 DIAGNOSIS — E119 Type 2 diabetes mellitus without complications: Secondary | ICD-10-CM | POA: Diagnosis not present

## 2021-01-25 DIAGNOSIS — I10 Essential (primary) hypertension: Secondary | ICD-10-CM

## 2021-01-25 DIAGNOSIS — Z23 Encounter for immunization: Secondary | ICD-10-CM | POA: Diagnosis not present

## 2021-01-25 DIAGNOSIS — R300 Dysuria: Secondary | ICD-10-CM | POA: Diagnosis not present

## 2021-01-25 LAB — POCT GLYCOSYLATED HEMOGLOBIN (HGB A1C): HbA1c, POC (controlled diabetic range): 7.8 % — AB (ref 0.0–7.0)

## 2021-01-25 LAB — POCT URINALYSIS DIP (MANUAL ENTRY)
Bilirubin, UA: NEGATIVE
Blood, UA: NEGATIVE
Glucose, UA: NEGATIVE mg/dL
Ketones, POC UA: NEGATIVE mg/dL
Leukocytes, UA: NEGATIVE
Nitrite, UA: NEGATIVE
Protein Ur, POC: NEGATIVE mg/dL
Spec Grav, UA: 1.02 (ref 1.010–1.025)
Urobilinogen, UA: 0.2 E.U./dL
pH, UA: 6 (ref 5.0–8.0)

## 2021-01-25 MED ORDER — FUROSEMIDE 40 MG PO TABS: 20.0000 mg | ORAL_TABLET | Freq: Every day | ORAL | 0 refills | Status: DC

## 2021-01-25 MED ORDER — CLONIDINE HCL 0.2 MG PO TABS: 0.1000 mg | ORAL_TABLET | Freq: Two times a day (BID) | ORAL | 3 refills | Status: DC

## 2021-01-25 NOTE — Patient Instructions (Addendum)
Thank you for coming to see me today. It was a pleasure.    I have scheduled you for an ultrasound of your heart and legs.  We will call you with the appointments  I have ordered Lasix to take for the next three days.  I will contact our pharmacy team to help with your medications so we can adjust these to help you better.  Please bring in all of your medications on Tuesday.  Please follow-up with me on Tuesday for repeat labs and evaluation of shortness of breath.  If you have any chest pain, worsening shortness of breath or feel weak or dizzy please go to the emergency department  If you have any questions or concerns, please do not hesitate to call the office at [phone].  Best,   Carollee Leitz, MD

## 2021-01-25 NOTE — Progress Notes (Signed)
    SUBJECTIVE:   CHIEF COMPLAINT / HPI: leg swelling   Patient reports leg swelling for about three weeks associated with worsening pain in both legs.  Describes a tight feeling and now has to use cane to help with ambulating secondary to discomfort. Endorses mild shortness of breath when walking and can only walk half distance she was used to. Denies any chest pain, cough, palpitations or difficulty breathing.  Endorses weight gain but unsure how much and no changes in diet.  She also reports that she had not urinated in a couple of days.  Denies any fevers, abdominal pain or back pain.  She was able to urinate in clinic today without difficulty.  She reports compliancy with medications but is unsure which medications she is taking. She has not been taking Aldactone and reports that she was unaware of this medication.   PERTINENT  PMH / PSH:  HTN DM Type 2 COPD Mixed incontinence  OBJECTIVE:   BP 136/68   Pulse (!) 58   Ht 5\' 1"  (1.549 m)   Wt 165 lb 3.2 oz (74.9 kg)   SpO2 99%   BMI 31.21 kg/m    General: Alert, no acute distress Cardio: Normal S1 and S2, RRR, no r/m/g, no JVD appreciated Pulm: CTAB, diminished at bases, normal work of breathing, no wheezing or crackles noted Abdomen: Bowel sounds normal. Abdomen soft and non-tender.  Extremities: 2 plus pitting edema extending to knee, distal pulses present, right lower extremity slightly more edematous than left, mild tenderness on palpation bilaterally.  Strength normal   ASSESSMENT/PLAN:   Bilateral lower extremity edema Unclear etiology.  Considered cardiac and renal causes. Less likely DVT given no history of immobilization and edema bilateral. -Lasix 20 mg x 3/7 -ECHO, lower extremity dopplers. -Cmet, CBC, BNP, urinalysis today -Follow up with me April 5 and repeat Bmet at that time -Strict return precautions provided  Essential hypertension, benign BP at goal today.  Unclear all medications she is taking.    -Decrease Clonidine to 0.1mg  BID, would like to take off but patient reluctant as this helps her sleep -Continue current medications until able to review at next appointment. -Appointment scheduled with Pharmacy April 5 to review medications.  Patient asked to bring in all meds.  -Reports taking HCTZ 12.5 mg daily, Amlodipine 10 mg daily, Losartan 25 mg daily, Propranolol 40 mg BID, Clonidine 0.1mg  BID  -Not taking Aldactone 25 mg daily  Well controlled type 2 diabetes mellitus ([place]) No hypoglycemic events.  Last A1c 6.8. -A1c today 7.8 -Continue Metformin XR 500 mg BID -Discussed diet -Lipid panel today -Continue Atorvastatin -Follow up 3/12, if A1c continues to elevate will discuss increasing medication      Carollee Leitz, MD [place]

## 2021-01-26 ENCOUNTER — Telehealth: Payer: Self-pay | Admitting: Family Medicine

## 2021-01-26 ENCOUNTER — Encounter: Payer: Self-pay | Admitting: Family Medicine

## 2021-01-26 DIAGNOSIS — R600 Localized edema: Secondary | ICD-10-CM | POA: Insufficient documentation

## 2021-01-26 LAB — COMPREHENSIVE METABOLIC PANEL
ALT: 15 IU/L (ref 0–32)
AST: 13 IU/L (ref 0–40)
Albumin/Globulin Ratio: 1.6 (ref 1.2–2.2)
Albumin: 4.3 g/dL (ref 3.8–4.8)
Alkaline Phosphatase: 99 IU/L (ref 44–121)
BUN/Creatinine Ratio: 12 (ref 12–28)
BUN: 15 mg/dL (ref 8–27)
Bilirubin Total: 0.4 mg/dL (ref 0.0–1.2)
CO2: 20 mmol/L (ref 20–29)
Calcium: 9.7 mg/dL (ref 8.7–10.3)
Chloride: 105 mmol/L (ref 96–106)
Creatinine, Ser: 1.22 mg/dL — ABNORMAL HIGH (ref 0.57–1.00)
Globulin, Total: 2.7 g/dL (ref 1.5–4.5)
Glucose: 145 mg/dL — ABNORMAL HIGH (ref 65–99)
Potassium: 4.3 mmol/L (ref 3.5–5.2)
Sodium: 142 mmol/L (ref 134–144)
Total Protein: 7 g/dL (ref 6.0–8.5)
eGFR: 48 mL/min/{1.73_m2} — ABNORMAL LOW (ref >59)

## 2021-01-26 LAB — LIPID PANEL
Chol/HDL Ratio: 4.9 ratio — ABNORMAL HIGH (ref 0.0–4.4)
Cholesterol, Total: 188 mg/dL (ref 100–199)
HDL: 38 mg/dL — ABNORMAL LOW (ref >39)
LDL Chol Calc (NIH): 121 mg/dL — ABNORMAL HIGH (ref 0–99)
Triglycerides: 161 mg/dL — ABNORMAL HIGH (ref 0–149)
VLDL Cholesterol Cal: 29 mg/dL (ref 5–40)

## 2021-01-26 LAB — CBC
Hematocrit: 38.3 % (ref 34.0–46.6)
Hemoglobin: 12.5 g/dL (ref 11.1–15.9)
MCH: 28.3 pg (ref 26.6–33.0)
MCHC: 32.6 g/dL (ref 31.5–35.7)
MCV: 87 fL (ref 79–97)
Platelets: 319 10*3/uL (ref 150–450)
RBC: 4.42 x10E6/uL (ref 3.77–5.28)
RDW: 12.8 % (ref 11.7–15.4)
WBC: 8.6 10*3/uL (ref 3.4–10.8)

## 2021-01-26 LAB — BRAIN NATRIURETIC PEPTIDE: BNP: 31.7 pg/mL (ref 0.0–100.0)

## 2021-01-26 NOTE — Assessment & Plan Note (Addendum)
Unclear etiology.  Considered cardiac and renal causes. Less likely DVT given no history of immobilization and edema bilateral. -Lasix 20 mg x 3/7 -ECHO, lower extremity dopplers. -Cmet, CBC, BNP, urinalysis today -Follow up with me April 5 and repeat Bmet at that time -Strict return precautions provided

## 2021-01-26 NOTE — Telephone Encounter (Signed)
Called patient to discuss increase in creatinine and plan to stop Lasix.  No answer and unable to LVM.  Patient has scheduled appointment Tues April 5 for repeat labs.  Carollee Leitz, MD Family Medicine Res[facility]

## 2021-01-26 NOTE — Assessment & Plan Note (Signed)
No hypoglycemic events.  Last A1c 6.8. -A1c today 7.8 -Continue Metformin XR 500 mg BID -Discussed diet -Lipid panel today -Continue Atorvastatin -Follow up 3/12, if A1c continues to elevate will discuss increasing medication

## 2021-01-26 NOTE — Assessment & Plan Note (Addendum)
BP at goal today.  Unclear all medications she is taking.   -Decrease Clonidine to 0.1mg  BID, would like to take off but patient reluctant as this helps her sleep -Continue current medications until able to review at next appointment. -Appointment scheduled with Pharmacy April 5 to review medications.  Patient asked to bring in all meds.  -Reports taking HCTZ 12.5 mg daily, Amlodipine 10 mg daily, Losartan 25 mg daily, Propranolol 40 mg BID, Clonidine 0.1mg  BID  -Not taking Aldactone 25 mg daily

## 2021-01-29 ENCOUNTER — Ambulatory Visit: Payer: 59 | Admitting: Pharmacist

## 2021-01-29 ENCOUNTER — Encounter: Payer: Self-pay | Admitting: Family Medicine

## 2021-01-29 ENCOUNTER — Other Ambulatory Visit: Payer: Self-pay

## 2021-01-29 ENCOUNTER — Telehealth (INDEPENDENT_AMBULATORY_CARE_PROVIDER_SITE_OTHER): Payer: 59 | Admitting: Family Medicine

## 2021-01-29 DIAGNOSIS — G5603 Carpal tunnel syndrome, bilateral upper limbs: Secondary | ICD-10-CM

## 2021-01-29 DIAGNOSIS — R600 Localized edema: Secondary | ICD-10-CM

## 2021-01-29 NOTE — Progress Notes (Signed)
Patient had not connected to MyChart for virtual visit. Called patient x 3 for scheduled appointment this am. LVM to reschedule. Also left message with clinic staff that if patient shows up that she needs to have labs and can reschedule appointment.  Bmet ordered for f/u Cr and recent diuretic use from previous visit.  Carollee Leitz, MD Family Medicine Residency

## 2021-02-01 ENCOUNTER — Ambulatory Visit (INDEPENDENT_AMBULATORY_CARE_PROVIDER_SITE_OTHER): Payer: 59 | Admitting: Pharmacist

## 2021-02-01 ENCOUNTER — Other Ambulatory Visit: Payer: Self-pay

## 2021-02-01 ENCOUNTER — Encounter: Payer: Self-pay | Admitting: Pharmacist

## 2021-02-01 DIAGNOSIS — I10 Essential (primary) hypertension: Secondary | ICD-10-CM

## 2021-02-01 MED ORDER — TRIAMCINOLONE ACETONIDE 0.5 % EX OINT: 1.0000 "application " | TOPICAL_OINTMENT | Freq: Two times a day (BID) | CUTANEOUS | 3 refills | Status: DC

## 2021-02-01 MED ORDER — LORATADINE 10 MG PO TBDP: 10.0000 mg | ORAL_TABLET | Freq: Every day | ORAL | 12 refills | Status: AC

## 2021-02-01 MED ORDER — CLOTRIMAZOLE 1 % EX CREA: 1.0000 "application " | TOPICAL_CREAM | Freq: Two times a day (BID) | CUTANEOUS | 1 refills | Status: DC

## 2021-02-01 MED ORDER — HYDROCHLOROTHIAZIDE 12.5 MG PO TABS: 12.5000 mg | ORAL_TABLET | Freq: Every day | ORAL | 5 refills | Status: DC

## 2021-02-01 MED ORDER — CLOBETASOL PROPIONATE 0.05 % EX OINT: TOPICAL_OINTMENT | Freq: Two times a day (BID) | CUTANEOUS | 1 refills | Status: DC

## 2021-02-01 NOTE — Assessment & Plan Note (Signed)
Longstanding Hypertension diagnosed in 2009 and currently not at goal on current medications. BP Goal = <060 mmHg systolic. Medication adherence good however her self-discontinuation of diuretic therapy is concerning.   -Restarted HCTZ 12.5mg  once daily in the AM - Continued all other BP meds:  amlodipine 10mg , clonidine, losartan 25mg  and propranolol 40mg  BID.  -Counseled on need for adherence with fluid pills.  I am happy to see patient again - reconsider titrating ARB to max dose

## 2021-02-01 NOTE — Progress Notes (Signed)
Reviewed: I agree with Dr. Koval's documentation and management. 

## 2021-02-01 NOTE — Patient Instructions (Addendum)
Nice to see you today.   Please restart your Hydrochlorothiazide (HCTZ) 12.5mg  once daily  I sent a new prescription today as well as refills for other medications you were out of.   Please schedule follow-up with Dr. Volanda Napoleon in 1-2 weeks.

## 2021-02-01 NOTE — Progress Notes (Signed)
S:    Patient arrives in good spirits, ambulating without assistance.    Presents to the clinic for hypertension evaluation, counseling, and management.  She has brought all of her current medications to the visit as requested.  Patient was referred and last seen by Primary Care Provider, Dr. Volanda Napoleon on 01/29/2021  Medication adherence appears to be excellent, however admits to stopping both spironolactone and HCTZ several months ago due to "peeing all the time". .  Current BP Medications include:  amlodipine 10mg , clonidine, losartan 25mg  and propranolol 40mg  BID.   Antihypertensives tried in the past include: HCTZ and Spironolactone (both self-discontinued within the last year).   Exercise habits include: minimal (isolating from Covid) Family / Social history: significant for early sudden cardiac death in sister (age 18) - heart attack   O:  Physical Exam Constitutional:      Appearance: Normal appearance.  Pulmonary:     Effort: Pulmonary effort is normal.  Musculoskeletal:     Right lower leg: No edema.     Left lower leg: Edema (2+) present.  Neurological:     Mental Status: She is alert.  Psychiatric:        Mood and Affect: Mood normal.        Behavior: Behavior normal.        Thought Content: Thought content normal.     Review of Systems  Cardiovascular: Positive for leg swelling (left leg ).    Home BP readings: does not check  Last 3 Office BP readings: BP Readings from Last 3 Encounters:  02/01/21 (!) 162/84  01/25/21 136/68  09/14/20 (!) 162/94    BMET    Component Value Date/Time   NA 142 01/25/2021 1149   K 4.3 01/25/2021 1149   CL 105 01/25/2021 1149   CO2 20 01/25/2021 1149   GLUCOSE 145 (H) 01/25/2021 1149   GLUCOSE 101 (H) 09/04/2016 1619   BUN 15 01/25/2021 1149   CREATININE 1.22 (H) 01/25/2021 1149   CREATININE 1.07 (H) 09/04/2016 1619   CALCIUM 9.7 01/25/2021 1149   GFRNONAA 62 09/06/2020 1454   GFRNONAA 54 (L) 09/04/2016 1619   GFRAA  71 09/06/2020 1454   GFRAA 63 09/04/2016 1619    Renal function: Estimated Creatinine Clearance: 40.2 mL/min (A) (by C-G formula based on SCr of 1.22 mg/dL (H)).  Clinical ASCVD: No  The 10-year ASCVD risk score Mikey Bussing DC Jr., et al., 2013) is: 35.8%   Values used to calculate the score:     Age: 70 years     Sex: Female     Is Non-Hispanic African American: Yes     Diabetic: Yes     Tobacco smoker: No     Systolic Blood Pressure: 161 mmHg     Is BP treated: Yes     HDL Cholesterol: 38 mg/dL     Total Cholesterol: 188 mg/dL  A/P: Longstanding Hypertension diagnosed in 2009 and currently not at goal on current medications. BP Goal = <096 mmHg systolic. Medication adherence good however her self-discontinuation of diuretic therapy is concerning.   -Restarted HCTZ 12.5mg  once daily in the AM - Continued all other BP meds:  amlodipine 10mg , clonidine, losartan 25mg  and propranolol 40mg  BID.  -Counseled on need for adherence with fluid pills.  I am happy to see patient again - reconsider titrating ARB to max dose prior to diuretic adjustment as next step.   Let swelling left leg remains - additional workup with PCP pending.   Results  reviewed and written information provided.   Total time in face-to-face counseling 25 minutes.   F/U Clinic Visit with PCP, Dr. Volanda Napoleon in 1-2 weeks.

## 2021-02-22 ENCOUNTER — Other Ambulatory Visit: Payer: Self-pay

## 2021-02-22 ENCOUNTER — Telehealth: Payer: Self-pay

## 2021-02-22 MED ORDER — AMLODIPINE BESYLATE 10 MG PO TABS: 10.0000 mg | ORAL_TABLET | Freq: Every day | ORAL | 3 refills | Status: DC

## 2021-02-22 MED ORDER — ATORVASTATIN CALCIUM 80 MG PO TABS: 80.0000 mg | ORAL_TABLET | Freq: Every day | ORAL | 3 refills | Status: DC

## 2021-02-22 NOTE — Telephone Encounter (Signed)
Fax from [facility] requesting a refill of Valacyclovir 500 MG tab. Take by mouth twice daily for 5 days as needed for flares. Did not see this on med list. Ottis Stain, [place]

## 2021-02-22 NOTE — Telephone Encounter (Signed)
Received fax from [facility] asking for a refill of Fluconazole 150 MG Tabs. Take 1 tablet by mouth 1 time.  Did not see on med list. Ottis Stain, CMA

## 2021-02-23 ENCOUNTER — Other Ambulatory Visit (HOSPITAL_COMMUNITY): Payer: Self-pay | Admitting: Psychiatry

## 2021-02-23 ENCOUNTER — Other Ambulatory Visit: Payer: Self-pay | Admitting: Internal Medicine

## 2021-02-23 DIAGNOSIS — F330 Major depressive disorder, recurrent, mild: Secondary | ICD-10-CM

## 2021-02-23 DIAGNOSIS — F419 Anxiety disorder, unspecified: Secondary | ICD-10-CM

## 2021-02-24 ENCOUNTER — Other Ambulatory Visit: Payer: Self-pay | Admitting: Family Medicine

## 2021-02-24 MED ORDER — FLUCONAZOLE 150 MG PO TABS: 150.0000 mg | ORAL_TABLET | Freq: Once | ORAL | 0 refills | Status: AC

## 2021-02-24 NOTE — Telephone Encounter (Signed)
Please let patient know script sent

## 2021-02-24 NOTE — Progress Notes (Signed)
Script for Diflucan sent x 1 tab.  If continues to have symptoms please make an appointment  Carollee Leitz, MD Family Medicine Residency

## 2021-02-25 ENCOUNTER — Telehealth: Payer: Self-pay | Admitting: *Deleted

## 2021-02-25 NOTE — Telephone Encounter (Signed)
This does show rx sent on 5/1 and ended on 5/1. Therefore it was not on current med list. Does say pharmacy received it electronically. Raylan Troiani Zimmerman Rumple, CMA

## 2021-02-25 NOTE — Telephone Encounter (Signed)
Received fx requesting refill on fluconazole 150 mg tablets, did not see on current med list. Waylin Dorko [name], CMA

## 2021-02-26 NOTE — Telephone Encounter (Signed)
LVM to let pt know Rx was sent to [facility]. If she was unable to pick it up or had a problem with the Rx to give [place] a call. Ottis Stain, CMA

## 2021-03-05 ENCOUNTER — Telehealth (INDEPENDENT_AMBULATORY_CARE_PROVIDER_SITE_OTHER): Payer: 59 | Admitting: Psychiatry

## 2021-03-05 ENCOUNTER — Encounter (HOSPITAL_COMMUNITY): Payer: Self-pay | Admitting: Psychiatry

## 2021-03-05 ENCOUNTER — Telehealth (HOSPITAL_COMMUNITY): Payer: Self-pay | Admitting: *Deleted

## 2021-03-05 ENCOUNTER — Other Ambulatory Visit: Payer: Self-pay

## 2021-03-05 ENCOUNTER — Other Ambulatory Visit (HOSPITAL_COMMUNITY): Payer: Self-pay | Admitting: *Deleted

## 2021-03-05 DIAGNOSIS — F419 Anxiety disorder, unspecified: Secondary | ICD-10-CM | POA: Diagnosis not present

## 2021-03-05 DIAGNOSIS — F330 Major depressive disorder, recurrent, mild: Secondary | ICD-10-CM | POA: Diagnosis not present

## 2021-03-05 DIAGNOSIS — F331 Major depressive disorder, recurrent, moderate: Secondary | ICD-10-CM

## 2021-03-05 MED ORDER — MIRTAZAPINE 30 MG PO TABS: ORAL_TABLET | ORAL | 0 refills | Status: DC

## 2021-03-05 MED ORDER — DIVALPROEX SODIUM 250 MG PO DR TAB
750.0000 mg | DELAYED_RELEASE_TABLET | Freq: Every day | ORAL | 0 refills | Status: DC
Start: 2021-03-05 — End: 2021-07-18

## 2021-03-05 MED ORDER — BUPROPION HCL ER (XL) 300 MG PO TB24: 300.0000 mg | ORAL_TABLET | ORAL | 0 refills | Status: DC

## 2021-03-05 NOTE — Progress Notes (Signed)
Virtual Visit via Telephone Note  I connected with [name] on 03/05/21 at  3:20 PM EDT by telephone and verified that I am speaking with the correct person using two identifiers.  Location: Patient: .  Home Provider: Office   I discussed the limitations, risks, security and privacy concerns of performing an evaluation and management service by telephone and the availability of in person appointments. I also discussed with the patient that there may be a patient responsible charge related to this service. The patient expressed understanding and agreed to proceed.   History of Present Illness: Patient is evaluated by phone session.  She is complaining of chronic health issues.  Lately she admitted weight gain but describes it is a water retention and she is given fluid pills which she has not started yet.  She is complaining of back pain.  Recent blood work shows creatinine 1.22 and her hemoglobin A1c increased to 7.8.  She admitted struggle keeping her blood sugar as not able to walk as much because of the back pain.  She has 1 panic attack recently when there was a power failure and she was very nervous and anxious.  She lives on the fourth floor and elevator was also out.  She had to stay on the lower level with the neighbors until the power came back.  She was very nervous and anxious but now she is feeling better.  She denies any nightmares or flashbacks.  She feels anxious some time but denies any crying spells or any feeling of hopelessness or worthlessness.  She understand her chronic health issues and trying to get these issues under control.  Due to back pain she is unable to walk and exercise.  She does not want to change the medication since she feels they are working for her anxiety and depression.  She is taking Depakote but her PCP did not do the blood work despite we have recommended.  Patient daughter lives in [place] who does come to visit her on a regular basis.   Past  Psychiatric History:Reviewed. [facility] [place] and [facility]. TriedPaxil,Prozac, vistaril andZoloft. Noh/osuicidal attempt or any psychosis.  Recent Results (from the past 2160 hour(s))  HgB A1c     Status: Abnormal   Collection Time: 01/25/21 10:07 AM  Result Value Ref Range   Hemoglobin A1C     HbA1c POC (<> result, manual entry)     HbA1c, POC (prediabetic range)     HbA1c, POC (controlled diabetic range) 7.8 (A) 0.0 - 7.0 %  POCT urinalysis dipstick     Status: None   Collection Time: 01/25/21 11:05 AM  Result Value Ref Range   Color, UA yellow yellow   Clarity, UA clear clear   Glucose, UA negative negative mg/dL   Bilirubin, UA negative negative   Ketones, POC UA negative negative mg/dL   Spec Grav, UA 1.020 1.010 - 1.025   Blood, UA negative negative   pH, UA 6.0 5.0 - 8.0   Protein Ur, POC negative negative mg/dL   Urobilinogen, UA 0.2 0.2 or 1.0 E.U./dL   Nitrite, UA Negative Negative   Leukocytes, UA Negative Negative  Lipid panel     Status: Abnormal   Collection Time: 01/25/21 11:49 AM  Result Value Ref Range   Cholesterol, Total 188 100 - 199 mg/dL   Triglycerides 161 (H) 0 - 149 mg/dL   HDL 38 (L) >39 mg/dL  VLDL Cholesterol Cal 29 5 - 40 mg/dL   LDL Chol Calc (NIH) 121 (H) 0 - 99 mg/dL   Chol/HDL Ratio 4.9 (H) 0.0 - 4.4 ratio    Comment:                                   T. Chol/HDL Ratio                                             Men  Women                               1/2 Avg.Risk  3.4    3.3                                   Avg.Risk  5.0    4.4                                2X Avg.Risk  9.6    7.1                                3X Avg.Risk 23.4   11.0   Brain natriuretic peptide     Status: None   Collection Time: 01/25/21 11:49 AM  Result Value Ref Range   BNP 31.7 0.0 - 100.0 pg/mL  Comprehensive metabolic panel     Status:  Abnormal   Collection Time: 01/25/21 11:49 AM  Result Value Ref Range   Glucose 145 (H) 65 - 99 mg/dL   BUN 15 8 - 27 mg/dL   Creatinine, Ser 1.22 (H) 0.57 - 1.00 mg/dL   eGFR 48 (L) >59 mL/min/1.73   BUN/Creatinine Ratio 12 12 - 28   Sodium 142 134 - 144 mmol/L   Potassium 4.3 3.5 - 5.2 mmol/L   Chloride 105 96 - 106 mmol/L   CO2 20 20 - 29 mmol/L   Calcium 9.7 8.7 - 10.3 mg/dL   Total Protein 7.0 6.0 - 8.5 g/dL   Albumin 4.3 3.8 - 4.8 g/dL   Globulin, Total 2.7 1.5 - 4.5 g/dL   Albumin/Globulin Ratio 1.6 1.2 - 2.2   Bilirubin Total 0.4 0.0 - 1.2 mg/dL   Alkaline Phosphatase 99 44 - 121 IU/L   AST 13 0 - 40 IU/L   ALT 15 0 - 32 IU/L  CBC     Status: None   Collection Time: 01/25/21 11:49 AM  Result Value Ref Range   WBC 8.6 3.4 - 10.8 x10E3/uL   RBC 4.42 3.77 - 5.28 x10E6/uL   Hemoglobin 12.5 11.1 - 15.9 g/dL   Hematocrit 38.3 34.0 - 46.6 %   MCV 87 79 - 97 fL   MCH 28.3 26.6 - 33.0 pg   MCHC 32.6 31.5 - 35.7 g/dL   RDW 12.8 11.7 - 15.4 %   Platelets 319 150 - 450 x10E3/uL    Psychiatric Specialty Exam: Physical Exam  Review of Systems  Weight 169 lb (76.7 kg).There is no height or weight on file to calculate BMI.  General Appearance:  NA  Eye Contact:  NA  Speech:  Normal Rate  Volume:  Normal  Mood:  Anxious  Affect:  NA  Thought Process:  Goal Directed  Orientation:  Full (Time, Place, and Person)  Thought Content:  Rumination  Suicidal Thoughts:  No  Homicidal Thoughts:  No  Memory:  Immediate;   Good Recent;   Good Remote;   Good  Judgement:  Intact  Insight:  Present  Psychomotor Activity:  NA  Concentration:  Concentration: Fair and Attention Span: Fair  Recall:  Good  Fund of Knowledge:  Good  Language:  Good  Akathisia:  No  Handed:  Right  AIMS (if indicated):     Assets:  Communication Skills Desire for Improvement Housing Resilience  ADL's:  Intact  Cognition:  WNL  Sleep:   ok      Assessment and Plan: Major depressive disorder,  recurrent.  Anxiety.  I reviewed blood work results.  Her hemoglobin A1c is increased.  She did not have a Depakote level and she is upset because her PCP had blood work but not the Depakote level.  We have also reminded her PCP to do the Depakote level.  I explained that we have to do the blood work to get the Depakote level.  But she is not happy but agreed to do that.  Continue Wellbutrin XL 300 mg daily, mirtazapine 30 mg at bedtime and Depakote 750 mg at bedtime.  Encouraged healthy lifestyle and watch her calorie intake.  Patient has no concern from the medication.  Provided 30-day supply of Depakote and 90-day supply of mirtazapine and Wellbutrin.  Recommended to call us back if she is any question or any concern.  Follow-up in 3 months  Follow Up Instructions:    I discussed the assessment and treatment plan with the patient. The patient was provided an opportunity to ask questions and all were answered. The patient agreed with the plan and demonstrated an understanding of the instructions.   The patient was advised to call back or seek an in-person evaluation if the symptoms worsen or if the condition fails to improve as anticipated.  I provided 17 minutes of non-face-to-face time during this encounter.   Kathlee Nations, MD

## 2021-03-05 NOTE — Telephone Encounter (Signed)
Writer advised pt that Valproic Acid level has been ordered and sent to [facility] @ [address] [place]. Pt also advised not to take any Depakote the morning of lab draw (pt takes at hs). Pt verbalizes understanding.

## 2021-03-11 ENCOUNTER — Telehealth (HOSPITAL_COMMUNITY): Payer: Self-pay

## 2021-03-11 NOTE — Telephone Encounter (Signed)
Received fax from [place] requesting a refill on patient's Divalproex 250mg  DR tablet. Spoke with patient & she advised me that she's all set on her medications. She has enough & doesn't know why they sent [place] a refill request

## 2021-03-24 NOTE — Progress Notes (Signed)
    SUBJECTIVE:   CHIEF COMPLAINT / HPI: wants A1c checked  Requesting A1c checked as she has been having increase in leg swelling and weight gain.   Was recently seen by mental health provider who recommended she see PCP. She was previously seen for similar symptoms and an ECHO as well as lower extremity dopplers were ordered at that time and not completed. Patient reports she forgot to get this done.  Her shortness of breath has not worsened since her last visit and her weight has decreased 5 lbs.  We had previously discussed that Amlodipine can cause peripheral edema and if this is intolerable then we can switch medication.     MDD She reports that she has been taking her Depakote every three days and is supposed to have blood work completed that I presume is ordered by the ordering provider. Unfortunately this was not done.   PERTINENT  PMH / PSH:  HTN MDD   OBJECTIVE:   BP (!) 150/80   Pulse 94   Ht 5\' 1"  (1.549 m)   Wt 160 lb 9.6 oz (72.8 kg)   SpO2 97%   BMI 30.35 kg/m    General: Alert, no acute distress Cardio: Normal S1 and S2, RRR, no r/m/g Pulm: CTAB, normal work of breathing Abdomen: Bowel sounds normal. Abdomen soft and non-tender.  Extremities: 2 plus pitting edema bilaterally. Pulses present   ASSESSMENT/PLAN:   Bilateral lower extremity edema Continues to have bilateral lower extremity and improves with elevation.  Lungs clear on exam. Weight down 5lbs since last visit.  Likely venous insufficiency although Amlodipine could also be contributing to increasing peripheral edema.  Less likely DVT with bilateral swelling and Wells score 0.  No history of CHF so less likely given normal lung exam and no JVD appreciated. -Offered to switch Amlodipine, patient wants to stay on medication -Continue HCTZ 12.5 mg -Compression stockings -Elevate legs as needed -Awaiting ECHO and lower extremity dopplers    -BMP/TSH today -Follow up in 4 weeks or sooner if  needed -Strict return precautions provided   Severe episode of recurrent major depressive disorder (HCC) Taking Depakote every other night.  No recent monitoring noted. -Will obtain Valproic Acid level  -Restart Valproic Acid 750 mg daily as previously ordered  -Follow up with mental health provider for changes in medications and monitoring of levels.   Essential hypertension, benign Uncontrolled HTN.  Not at goal <130.  Has just recently started HCTZ 12.5 mg after self stopping. -Continue Amlodipine 10 mg daily, Losartan 25 mg daily, Propranolol 40 mg BID -Decrease Clonidine to 0.1 mg q hs, plan to continue to decrease to complete stop in next few visits. -If no improvement in BP at next visit will likely need increase in Losartan and would like to switch BB to Carvedilol -Follow up in 4 weeks -Strict return precautions provided   Vitamin d deficiency Last DEXA scan shows osteopenia Vit D recheck today. Repeat DEXA Start Fosamax weekly      Health Maintenance  Colonoscopy: encouraged to call to reschedule DEXA Scan ordered today. Hep C screening completed 05/2012 HIV screening  Completed 02/2016 COVID Vaccine: Booster today Pneumonia Vaccine readdress at next visit [place] Vaccine reports completed 6 years ago Fall Prevention: no recent falls A1c: most recent 7.8 (01/2021) Statin: Atorvastatin and [place], MD [place]

## 2021-03-24 NOTE — Patient Instructions (Addendum)
Thank you for coming to see me today. It was a pleasure.  We will get some labs today.  If they are abnormal or we need to do something about them, I will call you.  If they are normal, I will send you a message on MyChart (if it is active) or a letter in the mail.  If you don't hear from [place] in 2 weeks, please call the office at the number below.  I have placed an order for your Bone Scan  Please call [facility] at [phone] to schedule your appointment within one week.  Decrease your Clonidine to 0.01 mg at night.  Please follow-up with PCP in 4 weeks  If you have any questions or concerns, please do not hesitate to call the office at [phone].  Best,   Carollee Leitz, MD

## 2021-03-26 ENCOUNTER — Ambulatory Visit (INDEPENDENT_AMBULATORY_CARE_PROVIDER_SITE_OTHER): Payer: 59 | Admitting: Family Medicine

## 2021-03-26 ENCOUNTER — Telehealth: Payer: Self-pay

## 2021-03-26 ENCOUNTER — Ambulatory Visit (INDEPENDENT_AMBULATORY_CARE_PROVIDER_SITE_OTHER): Payer: 59

## 2021-03-26 ENCOUNTER — Other Ambulatory Visit: Payer: Self-pay

## 2021-03-26 VITALS — BP 150/80 | HR 94 | Ht 61.0 in | Wt 160.6 lb

## 2021-03-26 DIAGNOSIS — Z122 Encounter for screening for malignant neoplasm of respiratory organs: Secondary | ICD-10-CM

## 2021-03-26 DIAGNOSIS — Z23 Encounter for immunization: Secondary | ICD-10-CM

## 2021-03-26 DIAGNOSIS — Z1211 Encounter for screening for malignant neoplasm of colon: Secondary | ICD-10-CM

## 2021-03-26 DIAGNOSIS — M8580 Other specified disorders of bone density and structure, unspecified site: Secondary | ICD-10-CM | POA: Diagnosis not present

## 2021-03-26 DIAGNOSIS — R5383 Other fatigue: Secondary | ICD-10-CM

## 2021-03-26 DIAGNOSIS — Z1231 Encounter for screening mammogram for malignant neoplasm of breast: Secondary | ICD-10-CM | POA: Diagnosis not present

## 2021-03-26 DIAGNOSIS — N898 Other specified noninflammatory disorders of vagina: Secondary | ICD-10-CM

## 2021-03-26 DIAGNOSIS — G479 Sleep disorder, unspecified: Secondary | ICD-10-CM

## 2021-03-26 DIAGNOSIS — I10 Essential (primary) hypertension: Secondary | ICD-10-CM | POA: Diagnosis not present

## 2021-03-26 DIAGNOSIS — R600 Localized edema: Secondary | ICD-10-CM

## 2021-03-26 DIAGNOSIS — F332 Major depressive disorder, recurrent severe without psychotic features: Secondary | ICD-10-CM

## 2021-03-26 DIAGNOSIS — E559 Vitamin D deficiency, unspecified: Secondary | ICD-10-CM

## 2021-03-26 LAB — GLUCOSE, POCT (MANUAL RESULT ENTRY): POC Glucose: 134 mg/dl — AB (ref 70–99)

## 2021-03-26 MED ORDER — BLOOD PRESSURE KIT DEVI: 0 refills | Status: AC

## 2021-03-26 MED ORDER — ALENDRONATE SODIUM 70 MG PO TABS: 70.0000 mg | ORAL_TABLET | ORAL | 11 refills | Status: DC

## 2021-03-26 NOTE — Telephone Encounter (Signed)
Left voice message,informing patient of:   VAS [place] LOWER EXTREMITY VENOUS (DVT)  03/27/2021 1500 ARRIVAL 1445  C entrance @ [facility]  [phone]  .Ozella Almond, CMA

## 2021-03-27 ENCOUNTER — Ambulatory Visit (HOSPITAL_COMMUNITY): Admission: RE | Admit: 2021-03-27 | Payer: 59 | Source: Ambulatory Visit

## 2021-03-27 LAB — CBC
Hematocrit: 38.4 % (ref 34.0–46.6)
Hemoglobin: 12.8 g/dL (ref 11.1–15.9)
MCH: 28 pg (ref 26.6–33.0)
MCHC: 33.3 g/dL (ref 31.5–35.7)
MCV: 84 fL (ref 79–97)
Platelets: 289 10*3/uL (ref 150–450)
RBC: 4.57 x10E6/uL (ref 3.77–5.28)
RDW: 12.2 % (ref 11.7–15.4)
WBC: 7 10*3/uL (ref 3.4–10.8)

## 2021-03-27 LAB — BASIC METABOLIC PANEL
BUN/Creatinine Ratio: 8 — ABNORMAL LOW (ref 12–28)
BUN: 7 mg/dL — ABNORMAL LOW (ref 8–27)
CO2: 20 mmol/L (ref 20–29)
Calcium: 9.7 mg/dL (ref 8.7–10.3)
Chloride: 106 mmol/L (ref 96–106)
Creatinine, Ser: 0.93 mg/dL (ref 0.57–1.00)
Glucose: 130 mg/dL — ABNORMAL HIGH (ref 65–99)
Potassium: 4.2 mmol/L (ref 3.5–5.2)
Sodium: 143 mmol/L (ref 134–144)
eGFR: 66 mL/min/{1.73_m2} (ref >59)

## 2021-03-27 LAB — VALPROIC ACID LEVEL: Valproic Acid Lvl: <4 ug/mL — ABNORMAL LOW (ref 50–100)

## 2021-03-27 LAB — VITAMIN D 25 HYDROXY (VIT D DEFICIENCY, FRACTURES): Vit D, 25-Hydroxy: 11.7 ng/mL — ABNORMAL LOW (ref 30.0–100.0)

## 2021-03-27 LAB — TSH: TSH: 0.88 u[IU]/mL (ref 0.450–4.500)

## 2021-04-01 ENCOUNTER — Encounter: Payer: Self-pay | Admitting: Family Medicine

## 2021-04-01 NOTE — Assessment & Plan Note (Signed)
Last DEXA scan shows osteopenia Vit D recheck today. Repeat DEXA Start Fosamax weekly

## 2021-04-01 NOTE — Assessment & Plan Note (Signed)
Uncontrolled HTN.  Not at goal <130.  Has just recently started HCTZ 12.5 mg after self stopping. -Continue Amlodipine 10 mg daily, Losartan 25 mg daily, Propranolol 40 mg BID -Decrease Clonidine to 0.1 mg q hs, plan to continue to decrease to complete stop in next few visits. -If no improvement in BP at next visit will likely need increase in Losartan and would like to switch BB to Carvedilol -Follow up in 4 weeks -Strict return precautions provided

## 2021-04-01 NOTE — Assessment & Plan Note (Addendum)
Taking Depakote every other night.  No recent monitoring noted. -Will obtain Valproic Acid level  -Restart Valproic Acid 750 mg daily as previously ordered  -Follow up with mental health provider for changes in medications and monitoring of levels.

## 2021-04-01 NOTE — Assessment & Plan Note (Signed)
Continues to have bilateral lower extremity and improves with elevation.  Lungs clear on exam. Weight down 5lbs since last visit.  Likely venous insufficiency although Amlodipine could also be contributing to increasing peripheral edema.  Less likely DVT with bilateral swelling and Wells score 0.  No history of CHF so less likely given normal lung exam and no JVD appreciated. -Offered to switch Amlodipine, patient wants to stay on medication -Continue HCTZ 12.5 mg -Compression stockings -Elevate legs as needed -Awaiting ECHO and lower extremity dopplers    -BMP/TSH today -Follow up in 4 weeks or sooner if needed -Strict return precautions provided

## 2021-04-24 ENCOUNTER — Ambulatory Visit (HOSPITAL_COMMUNITY): Payer: 59

## 2021-05-08 ENCOUNTER — Other Ambulatory Visit: Payer: Self-pay | Admitting: Family Medicine

## 2021-05-09 ENCOUNTER — Ambulatory Visit (HOSPITAL_COMMUNITY): Payer: 59

## 2021-05-10 ENCOUNTER — Ambulatory Visit (HOSPITAL_COMMUNITY): Payer: 59

## 2021-05-12 ENCOUNTER — Other Ambulatory Visit: Payer: Self-pay | Admitting: Family Medicine

## 2021-05-13 ENCOUNTER — Other Ambulatory Visit: Payer: Self-pay | Admitting: Family Medicine

## 2021-05-18 ENCOUNTER — Other Ambulatory Visit: Payer: Self-pay | Admitting: Family Medicine

## 2021-05-18 ENCOUNTER — Other Ambulatory Visit (HOSPITAL_COMMUNITY): Payer: Self-pay | Admitting: Psychiatry

## 2021-05-18 DIAGNOSIS — F330 Major depressive disorder, recurrent, mild: Secondary | ICD-10-CM

## 2021-05-18 DIAGNOSIS — F419 Anxiety disorder, unspecified: Secondary | ICD-10-CM

## 2021-05-21 ENCOUNTER — Other Ambulatory Visit: Payer: Self-pay

## 2021-05-21 ENCOUNTER — Ambulatory Visit (INDEPENDENT_AMBULATORY_CARE_PROVIDER_SITE_OTHER): Payer: 59 | Admitting: Family Medicine

## 2021-05-21 ENCOUNTER — Other Ambulatory Visit: Payer: Self-pay | Admitting: Family Medicine

## 2021-05-21 VITALS — BP 140/60 | HR 57 | Temp 98.6°F | Ht 61.0 in | Wt 169.2 lb

## 2021-05-21 DIAGNOSIS — R0609 Other forms of dyspnea: Secondary | ICD-10-CM

## 2021-05-21 DIAGNOSIS — R0600 Dyspnea, unspecified: Secondary | ICD-10-CM

## 2021-05-21 DIAGNOSIS — R600 Localized edema: Secondary | ICD-10-CM

## 2021-05-21 MED ORDER — FUROSEMIDE 20 MG PO TABS: 20.0000 mg | ORAL_TABLET | Freq: Every day | ORAL | 0 refills | Status: DC

## 2021-05-21 NOTE — Patient Instructions (Addendum)
Thank you for coming in.  Today you were seen for shortness of breath. Please keep cardiology follow up.   Increase your furosemide to 40 mg (Lasix) you were originally supposed to take 20 mg daily.   For the lower extremity swelling elevate your legs, wear compression stockings, and decrease salt intake.   Go to the ED if you have chest pain, shortness of breath at rest, and/or  wheezing.   I will communicate with you when your labs are available.   Dr. Janus Molder

## 2021-05-21 NOTE — Telephone Encounter (Signed)
Hi Dr Janus Molder, Did you want her to be on this long term?  Thanks

## 2021-05-21 NOTE — Progress Notes (Signed)
    SUBJECTIVE:   CHIEF COMPLAINT / HPI:   [name] is a 70 yo F who presents for the following.  Dyspnea Experiencing shortness of breath with activity. This has been ongoing since last Monday. She states it has not worsened or improved. She will occasionally sweating, light headedness, and chest pain when brushing her hair. These symptoms are not currently present and do not occur at rest. She does have lower extremity swelling and notices her weight has increased. She does wear compression stockings and sleeps on two pillows. She does have a 20 pk/yr hx but stopped smoking 3 years prior and has not required the use of inhaler in a long time. She has an appointment with Cardiologist on Friday.   PERTINENT  PMH / PSH: HTN, COPD, GERD, T2DM, hx of bilateral mastectomy 2011  OBJECTIVE:   BP 140/60   Pulse (!) 57   Temp 98.6 F (37 C)   Ht '5\' 1"'$  (1.549 m)   Wt 169 lb 3.2 oz (76.7 kg)   SpO2 98%   BMI 31.97 kg/m   General: Appears to feel unwell, no acute distress. Age appropriate. Cardiac: RRR, normal heart sounds, no murmurs. Exam difficult due to breast apparatus.  Respiratory: CTAB, normal effort, no wheezing, rales, or rhonchi Extremities: 2+ pitting LE edema  ASSESSMENT/PLAN:   Dyspnea on exertion Etiology unknown at this time. Ddx includes CHF (no prior hx or prior echo) saw PCP 01/2021 and was scheduled for echo but was never obtain, thought of starting lasix 20 mg but never started. Weight is up 9 lbs since last visit 02/2021 and 15 lbs since 10/2020. Consider COPD exacerbation; non smoking for 3 years and no recent use of inhalers. Did desat with ambulation to 88% no home oxygen requirement. Consider COVID/viral illness although no sick symptoms at this time. No recent medication changes. Will have patient follow up with cardiologist. Start lasix at this time. And plan for follow up after cardiology appointment. Discussed with PCP consider PFT/sleep study.  - Cardiology  appointment 7/29 - Brain natriuretic peptide - furosemide (LASIX) 20 MG tablet; Take 1 tablet (20 mg total) by mouth daily.  Dispense: 30 tablet; Refill: 0 -ED precautions given  Bilateral lower extremity edema As above. In addition discussed compression stocking, salt reduction, and leg elevation.    [name], [place]

## 2021-05-22 DIAGNOSIS — R0600 Dyspnea, unspecified: Secondary | ICD-10-CM | POA: Insufficient documentation

## 2021-05-22 DIAGNOSIS — R0609 Other forms of dyspnea: Secondary | ICD-10-CM | POA: Insufficient documentation

## 2021-05-22 LAB — BRAIN NATRIURETIC PEPTIDE: BNP: 501.7 pg/mL — ABNORMAL HIGH (ref 0.0–100.0)

## 2021-05-22 NOTE — Assessment & Plan Note (Signed)
As above. In addition discussed compression stocking, salt reduction, and leg elevation.

## 2021-05-22 NOTE — Assessment & Plan Note (Addendum)
Etiology unknown at this time. Ddx includes CHF (no prior hx or prior echo) saw PCP 01/2021 and was scheduled for echo but was never obtain, thought of starting lasix 20 mg but never started. Weight is up 9 lbs since last visit 02/2021 and 15 lbs since 10/2020. Consider COPD exacerbation; non smoking for 3 years and no recent use of inhalers. Did desat with ambulation to 88% no home oxygen requirement. Consider COVID/viral illness although no sick symptoms at this time. No recent medication changes. Will have patient follow up with cardiologist. Start lasix at this time. And plan for follow up after cardiology appointment. Discussed with PCP consider PFT/sleep study.  - Cardiology appointment 7/29 - Brain natriuretic peptide - furosemide (LASIX) 20 MG tablet; Take 1 tablet (20 mg total) by mouth daily.  Dispense: 30 tablet; Refill: 0 -ED precautions given

## 2021-05-24 ENCOUNTER — Telehealth: Payer: Self-pay | Admitting: Family Medicine

## 2021-05-24 ENCOUNTER — Other Ambulatory Visit: Payer: Self-pay

## 2021-05-24 ENCOUNTER — Other Ambulatory Visit: Payer: Self-pay | Admitting: Family Medicine

## 2021-05-24 ENCOUNTER — Ambulatory Visit (HOSPITAL_COMMUNITY)
Admission: RE | Admit: 2021-05-24 | Discharge: 2021-05-24 | Disposition: A | Discharge status: Home / Self Care | Payer: 59 | Source: Ambulatory Visit | Attending: Family Medicine | Admitting: Family Medicine

## 2021-05-24 DIAGNOSIS — J449 Chronic obstructive pulmonary disease, unspecified: Secondary | ICD-10-CM | POA: Insufficient documentation

## 2021-05-24 DIAGNOSIS — R600 Localized edema: Secondary | ICD-10-CM | POA: Diagnosis present

## 2021-05-24 DIAGNOSIS — I10 Essential (primary) hypertension: Secondary | ICD-10-CM | POA: Diagnosis not present

## 2021-05-24 DIAGNOSIS — E785 Hyperlipidemia, unspecified: Secondary | ICD-10-CM | POA: Insufficient documentation

## 2021-05-24 DIAGNOSIS — F17200 Nicotine dependence, unspecified, uncomplicated: Secondary | ICD-10-CM | POA: Insufficient documentation

## 2021-05-24 DIAGNOSIS — E119 Type 2 diabetes mellitus without complications: Secondary | ICD-10-CM | POA: Diagnosis not present

## 2021-05-24 LAB — ECHOCARDIOGRAM COMPLETE
Area-P 1/2: 3.72 cm2
S' Lateral: 2.8 cm
Single Plane A4C EF: 63.6 %

## 2021-05-24 NOTE — Progress Notes (Signed)
  Echocardiogram 2D Echocardiogram has been performed.  [name] 05/24/2021, 2:53 PM

## 2021-05-24 NOTE — Telephone Encounter (Signed)
Attempted to call patient with result of BNP.  No answer left voicemail for patient to return call to clinic.  We will attempt to communicate via [place].  Gerlene Fee, DO 05/24/2021, 3:17 PM PGY-3, [place]

## 2021-06-10 ENCOUNTER — Encounter (HOSPITAL_COMMUNITY): Payer: Self-pay | Admitting: Psychiatry

## 2021-06-10 ENCOUNTER — Other Ambulatory Visit: Payer: Self-pay

## 2021-06-10 ENCOUNTER — Telehealth (INDEPENDENT_AMBULATORY_CARE_PROVIDER_SITE_OTHER): Payer: 59 | Admitting: Psychiatry

## 2021-06-10 DIAGNOSIS — F330 Major depressive disorder, recurrent, mild: Secondary | ICD-10-CM

## 2021-06-10 DIAGNOSIS — F419 Anxiety disorder, unspecified: Secondary | ICD-10-CM | POA: Diagnosis not present

## 2021-06-10 MED ORDER — BUPROPION HCL ER (XL) 300 MG PO TB24: 300.0000 mg | ORAL_TABLET | ORAL | 0 refills | Status: DC

## 2021-06-10 MED ORDER — MIRTAZAPINE 30 MG PO TABS: ORAL_TABLET | ORAL | 0 refills | Status: DC

## 2021-06-10 NOTE — Progress Notes (Signed)
Virtual Visit via Telephone Note  I connected with [name] on 06/10/21 at  1:20 PM EDT by telephone and verified that I am speaking with the correct person using two identifiers.  Location: Patient: Daughter home Provider: Office   I discussed the limitations, risks, security and privacy concerns of performing an evaluation and management service by telephone and the availability of in person appointments. I also discussed with the patient that there may be a patient responsible charge related to this service. The patient expressed understanding and agreed to proceed.   History of Present Illness: Patient is evaluated by phone session.  She is doing better on her medication.  We reviewed blood work results and her creatinine is improved from the past.  She is taking Depakote however her level is less than 4.  She is not sure what the level is subtherapeutic as she reaffirmed that taking Depakote every night.  She is sleeping good.  Currently she is staying with her daughter.  Patient has chronic health issues and recently she has seen her physician.  She denies any nightmares or flashbacks.  She denies any suicidal thoughts.  She has no tremors shakes or any EPS.  She like to keep the current medication however not sure about the Depakote as level is subtherapeutic.  Past Psychiatric History: Reviewed. H/O inpatient treatment at [facility] to detox from Xanax.  Saw Psychiatrist in [place], [place] and [facility].  Tried Paxil, Prozac, vistaril and Zoloft.  No h/o suicidal attempt or any psychosis.  Recent Results (from the past 2160 hour(s))  Basic Metabolic Panel     Status: Abnormal   Collection Time: 03/26/21  3:27 PM  Result Value Ref Range   Glucose 130 (H) 65 - 99 mg/dL   BUN 7 (L) 8 - 27 mg/dL   Creatinine, Ser 0.93 0.57 - 1.00 mg/dL   eGFR 66 >59 mL/min/1.73   BUN/Creatinine Ratio 8 (L) 12 - 28   Sodium 143 134 - 144 mmol/L   Potassium  4.2 3.5 - 5.2 mmol/L   Chloride 106 96 - 106 mmol/L   CO2 20 20 - 29 mmol/L   Calcium 9.7 8.7 - 10.3 mg/dL  CBC     Status: None   Collection Time: 03/26/21  3:27 PM  Result Value Ref Range   WBC 7.0 3.4 - 10.8 x10E3/uL   RBC 4.57 3.77 - 5.28 x10E6/uL   Hemoglobin 12.8 11.1 - 15.9 g/dL   Hematocrit 38.4 34.0 - 46.6 %   MCV 84 79 - 97 fL   MCH 28.0 26.6 - 33.0 pg   MCHC 33.3 31.5 - 35.7 g/dL   RDW 12.2 11.7 - 15.4 %   Platelets 289 150 - 450 x10E3/uL  VITAMIN D 25 Hydroxy (Vit-D Deficiency, Fractures)     Status: Abnormal   Collection Time: 03/26/21  3:27 PM  Result Value Ref Range   Vit D, 25-Hydroxy 11.7 (L) 30.0 - 100.0 ng/mL    Comment: Vitamin D deficiency has been defined by the Institute of Medicine and an Endocrine Society practice guideline as a level of serum 25-OH vitamin D less than 20 ng/mL (1,2). The Endocrine Society went on to further define vitamin D insufficiency as a level between 21 and 29 ng/mL (2). 1. IOM (Institute of Medicine). 2010. Dietary reference    intakes for calcium and D. [place]: The    [employer]. 2. Holick MF, Binkley Canavanas, Bischoff-Ferrari HA, et al.  Evaluation, treatment, and prevention of vitamin D    deficiency: an Endocrine Society clinical practice    guideline. JCEM. 2011 Jul; 96(7):1911-30.   TSH     Status: None   Collection Time: 03/26/21  3:27 PM  Result Value Ref Range   TSH 0.880 0.450 - 4.500 uIU/mL  Valproic acid level     Status: Abnormal   Collection Time: 03/26/21  3:27 PM  Result Value Ref Range   Valproic Acid Lvl <4 (L) 50 - 100 ug/mL    Comment: **Verified by repeat analysis**                                 Detection Limit = 4                            <4 indicates None Detected Toxicity may occur at levels of 100-500. Measurements of free unbound valproic acid may improve the assess- ment of clinical response.   Glucose (CBG)     Status: Abnormal   Collection Time: 03/26/21  3:38 PM   Result Value Ref Range   POC Glucose 134 (A) 70 - 99 mg/dl  Brain natriuretic peptide     Status: Abnormal   Collection Time: 05/21/21  1:48 PM  Result Value Ref Range   BNP 501.7 (H) 0.0 - 100.0 pg/mL  ECHOCARDIOGRAM COMPLETE     Status: None   Collection Time: 05/24/21  2:53 PM  Result Value Ref Range   S' Lateral 2.80 cm   Single Plane A4C EF 63.6 %   Area-P 1/2 3.72 cm2     Psychiatric Specialty Exam: Physical Exam  Review of Systems  Weight 169 lb (76.7 kg).There is no height or weight on file to calculate BMI.  General Appearance: NA  Eye Contact:  NA  Speech:  Clear and Coherent and Normal Rate  Volume:  Normal  Mood:  Euthymic  Affect:  NA  Thought Process:  Goal Directed  Orientation:  Full (Time, Place, and Person)  Thought Content:  WDL  Suicidal Thoughts:  No  Homicidal Thoughts:  No  Memory:  Immediate;   Good Recent;   Good Remote;   Good  Judgement:  Intact  Insight:  Present  Psychomotor Activity:  NA  Concentration:  Concentration: Good and Attention Span: Good  Recall:  Good  Fund of Knowledge:  Good  Language:  Good  Akathisia:  No  Handed:  Right  AIMS (if indicated):     Assets:  Communication Skills Desire for Improvement Housing Resilience  ADL's:  Intact  Cognition:  WNL  Sleep:   ok      Assessment and Plan: Major depressive disorder, recurrent.  Anxiety  I reviewed blood work results.  Her creatinine is improved from the past but Depakote level is subtherapeutic.  We discussed about compliance the patient.  Patient reaffirmed that she is taking however I discussed if she can try to come off from Depakote since level is subtherapeutic and patient replied yes.  Recommend to try Depakote 500 mg for 1 week and then to 50 mg for another week and discontinue.  We will keep the Wellbutrin XL 300 mg daily and mirtazapine 30 mg at bedtime.  If patient symptoms started to come back then we will resume Depakote for now we will titrate down and  discontinue.  Recommended to call us back  if she has any question of any concern.  Follow-up in 3 months  Follow Up Instructions:    I discussed the assessment and treatment plan with the patient. The patient was provided an opportunity to ask questions and all were answered. The patient agreed with the plan and demonstrated an understanding of the instructions.   The patient was advised to call back or seek an in-person evaluation if the symptoms worsen or if the condition fails to improve as anticipated.  I provided 16 minutes of non-face-to-face time during this encounter.   Kathlee Nations, MD

## 2021-06-11 LAB — HM DIABETES EYE EXAM

## 2021-06-28 ENCOUNTER — Other Ambulatory Visit: Payer: Self-pay | Admitting: Family Medicine

## 2021-06-28 DIAGNOSIS — R600 Localized edema: Secondary | ICD-10-CM

## 2021-06-28 DIAGNOSIS — R0600 Dyspnea, unspecified: Secondary | ICD-10-CM

## 2021-06-28 DIAGNOSIS — R0609 Other forms of dyspnea: Secondary | ICD-10-CM

## 2021-07-02 ENCOUNTER — Other Ambulatory Visit: Payer: Self-pay | Admitting: Family Medicine

## 2021-07-02 DIAGNOSIS — R600 Localized edema: Secondary | ICD-10-CM

## 2021-07-02 DIAGNOSIS — R0600 Dyspnea, unspecified: Secondary | ICD-10-CM

## 2021-07-02 DIAGNOSIS — R0609 Other forms of dyspnea: Secondary | ICD-10-CM

## 2021-07-03 ENCOUNTER — Ambulatory Visit: Payer: 59 | Admitting: Family Medicine

## 2021-07-04 ENCOUNTER — Other Ambulatory Visit: Payer: Self-pay

## 2021-07-04 ENCOUNTER — Ambulatory Visit (INDEPENDENT_AMBULATORY_CARE_PROVIDER_SITE_OTHER): Payer: 59

## 2021-07-04 DIAGNOSIS — Z0000 Encounter for general adult medical examination without abnormal findings: Secondary | ICD-10-CM | POA: Diagnosis not present

## 2021-07-04 NOTE — Progress Notes (Addendum)
 Subjective:   [name] is a 70 y.o. female who presents for Medicare Annual (Subsequent) preventive examination.  Patient consented to have virtual visit and was identified by name and date of birth. Method of visit: Telephone  Encounter participants: Patient: [name] - located at Home Nurse/Provider:  P  - located at [facility]  Others (if applicable): NA  Review of Systems: Defer to PCP  Cardiac Risk Factors include: advanced age (>55men, >65 women);diabetes mellitus;sedentary lifestyle;hypertension  Objective:   Vitals: There were no vitals taken for this visit.  There is no height or weight on file to calculate BMI.  Advanced Directives 07/04/2021 01/25/2021 09/14/2020 03/30/2020 11/22/2019 09/13/2019 01/04/2019  Does Patient Have a Medical Advance Directive? No No No No No No No  Would patient like information on creating a medical advance directive? Yes (MAU/Ambulatory/Procedural Areas - Information given) No - Patient declined No - Patient declined No - Patient declined No - Patient declined No - Patient declined No - Patient declined  Some encounter information is confidential and restricted. Go to Review Flowsheets activity to see all data.   Tobacco Social History   Tobacco Use  Smoking Status Former   Packs/day: 0.25   Years: 42.00   Pack years: 10.50   Types: Cigarettes   Quit date: 07/06/2017   Years since quitting: 4.0  Smokeless Tobacco Never     Counseling given: No plans to restart.   Clinical Intake:  Pre-visit preparation completed: Yes  Pain Score: 0-No pain  How often do you need to have someone help you when you read instructions, pamphlets, or other written materials from your doctor or pharmacy?: 2 - Rarely What is the last grade level you completed in school?: High School  Past Medical History:  Diagnosis Date   Adenomatous colon polyp    Allergy    Anxiety    Asthma    Back pain with radiation    radiopathy     Cataract    FORMING    COPD (chronic obstructive pulmonary disease) (HCC)    Corneal abrasion, left 04/28/2013   Depression    Diabetes mellitus without complication (HCC)    ON METFORMIN    GERD (gastroesophageal reflux disease)    Glaucoma    Hx of migraines    Hyperlipidemia    Hypertension    on meds   Nicotine addiction    PANIC ATTACK 11/05/2007   PELVIC  PAIN 08/13/2010   Urinary incontinence    Past Surgical History:  Procedure Laterality Date   ABDOMINAL HYSTERECTOMY     bleeding ulcer repair  1992   Open Ex Lap with perforation of duodenal ulcer with pertitonitis   CHOLECYSTECTOMY  1999   COLONOSCOPY     HIATAL HERNIA REPAIR  1967   left ankle surgery  2008   POLYPECTOMY     removal of mild ducts from left breast  1982   SIMPLE MASTECTOMY Bilateral 06/04/2010   TOTAL ABDOMINAL HYSTERECTOMY W/ BILATERAL SALPINGOOPHORECTOMY  1989   TUBAL LIGATION  1975, 1978   x 2   Family History  Problem Relation Age of Onset   Heart disease Mother    Diabetes Mother    Lupus Mother    Colon polyps Mother    Tuberculosis Father    Depression Sister    Diabetes Brother    Colon polyps Maternal Aunt    Heart disease Maternal Grandmother    Heart attack Maternal Aunt    Stomach cancer   Sister    Endometriosis Daughter    Diabetes Daughter    Colon cancer Neg Hx    Esophageal cancer Neg Hx    Pancreatic cancer Neg Hx    Rectal cancer Neg Hx    Social History   Socioeconomic History   Marital status: Legally Separated    Spouse name: Not on file   Number of children: 3   Years of education: 12   Highest education level: High school graduate  Occupational History   Occupation: retired   Occupation: Retired  Tobacco Use   Smoking status: Former    Packs/day: 0.25    Years: 42.00    Pack years: 10.50    Types: Cigarettes    Quit date: 07/06/2017    Years since quitting: 4.0   Smokeless tobacco: Never  Vaping Use   Vaping Use: Never used  Substance and Sexual  Activity   Alcohol use: No    Alcohol/week: 0.0 standard drinks   Drug use: No   Sexual activity: Not Currently    Birth control/protection: Post-menopausal  Other Topics Concern   Not on file  Social History Narrative   Patient lives alone in [place].    Patient uses public transportation.    Patient enjoys reading and watching detective shows.    Social Determinants of Health   Financial Resource Strain: Low Risk    Difficulty of Paying Living Expenses: Not hard at all  Food Insecurity: No Food Insecurity   Worried About Charity fundraiser in the Last Year: Never true   [place] in the Last Year: Never true  Transportation Needs: No Transportation Needs   Lack of Transportation (Medical): No   Lack of Transportation (Non-Medical): No  Physical Activity: Inactive   Days of Exercise per Week: 0 days   Minutes of Exercise per Session: 0 min  Stress: No Stress Concern Present   Feeling of Stress : Only a little  Social Connections: Socially Isolated   Frequency of Communication with Friends and Family: More than three times a week   Frequency of Social Gatherings with Friends and Family: More than three times a week   Attends Religious Services: Never   Marine scientist or Organizations: No   Attends Archivist Meetings: Never   Marital Status: Separated   Outpatient Encounter Medications as of 07/04/2021  Medication Sig   alendronate (FOSAMAX) 70 MG tablet Take 1 tablet (70 mg total) by mouth every 7 (seven) days. Take with a full glass of water on an empty stomach.   amLODipine (NORVASC) 10 MG tablet TAKE 1 TABLET(10 MG) BY MOUTH AT BEDTIME   atorvastatin (LIPITOR) 80 MG tablet Take 1 tablet (80 mg total) by mouth daily.   Blood Pressure Monitoring (BLOOD PRESSURE KIT) DEVI Please measure BP at same time each day   buPROPion (WELLBUTRIN XL) 300 MG 24 hr tablet Take 1 tablet (300 mg total) by mouth every morning.   clobetasol ointment (TEMOVATE) 0.05  % APPLY TOPICALLY TO THE AFFECTED AREA TWICE DAILY   cloNIDine (CATAPRES) 0.2 MG tablet Take 0.5 tablets (0.1 mg total) by mouth 2 (two) times daily.   clotrimazole (LOTRIMIN) 1 % cream Apply 1 application topically 2 (two) times daily.   divalproex (DEPAKOTE) 250 MG DR tablet Take 3 tablets (750 mg total) by mouth at bedtime.   ezetimibe (ZETIA) 10 MG tablet TAKE 1 TABLET(10 MG) BY MOUTH DAILY   fluticasone (FLONASE) 50 MCG/ACT nasal spray PLACE  2 SPRAYS DAILY INTO EACH NOSTRIL AS NEEDED   furosemide (LASIX) 20 MG tablet TAKE 1 TABLET(20 MG) BY MOUTH DAILY   hydrochlorothiazide (HYDRODIURIL) 12.5 MG tablet Take 1 tablet (12.5 mg total) by mouth daily.   latanoprost (XALATAN) 0.005 % ophthalmic solution 1 drop at bedtime.   LINZESS 290 MCG CAPS capsule TAKE 1 CAPSULE BY MOUTH DAILY BEFORE BREAKFAST   loratadine (CLARITIN REDITABS) 10 MG dissolvable tablet Take 1 tablet (10 mg total) by mouth daily. As needed for allergy symptoms   losartan (COZAAR) 25 MG tablet TAKE 1 TABLET(25 MG) BY MOUTH DAILY   metFORMIN (GLUCOPHAGE-XR) 500 MG 24 hr tablet TAKE 1 TABLET(500 MG) BY MOUTH TWICE DAILY   mirtazapine (REMERON) 30 MG tablet TAKE 1 TABLET(30 MG) BY MOUTH AT BEDTIME   mupirocin ointment (BACTROBAN) 2 % APPLY EXTERNALLY TO THE AFFECTED AREA THREE TIMES DAILY   pantoprazole (PROTONIX) 40 MG tablet TAKE 1 TABLET(40 MG) BY MOUTH DAILY   propranolol (INDERAL) 40 MG tablet Take 1 tablet (40 mg total) by mouth 2 (two) times daily.   triamcinolone ointment (KENALOG) 0.5 % Apply 1 application topically 2 (two) times daily. Do not use for more than 1 week at a time.   spironolactone (ALDACTONE) 25 MG tablet Take 1 tablet (25 mg total) by mouth at bedtime. (Patient not taking: No sig reported)   No facility-administered encounter medications on file as of 07/04/2021.   Activities of Daily Living In your present state of health, do you have any difficulty performing the following activities: 07/04/2021  Hearing?  N  Vision? N  Difficulty concentrating or making decisions? N  Walking or climbing stairs? Y  Dressing or bathing? N  Doing errands, shopping? N  Preparing Food and eating ? N  Using the Toilet? N  In the past six months, have you accidently leaked urine? Y  Do you have problems with loss of bowel control? N  Managing your Medications? N  Managing your Finances? N  Housekeeping or managing your Housekeeping? N  Some recent data might be hidden   Patient Care Team: Walsh, Tanya, MD as PCP - General (Family Medicine) Shapiro, Mark, MD as Consulting Physician (Ophthalmology) Pyrtle, Jay M, MD as Consulting Physician (Gastroenterology) Arfeen, Syed T, MD as Consulting Physician (Psychiatry)    Assessment:   This is a routine wellness examination for [name].  Exercise Activities and Dietary recommendations Current Exercise Habits: The patient does not participate in regular exercise at present, Exercise limited by: orthopedic condition(s)   Goals      Blood Pressure < 140/90     HEMOGLOBIN A1C < 7     7.8 01/2021       Fall Risk Fall Risk  07/04/2021 09/06/2020 03/30/2020 02/08/2020 11/22/2019  Falls in the past year? 1 0 0 0 0  Number falls in past yr: 0 0 0 - -  Injury with Fall? 0 - 0 - -  Risk for fall due to : Impaired balance/gait - - - -  Follow up - Falls evaluation completed - - -   Patient reports she does have balance/gait issues, however at this time does not use any assistance devices to ambulate. Patient declined DME order for cane/walker.   Is the patient's home free of loose throw rugs in walkways, pet beds, electrical cords, etc?   yes      Grab bars in the bathroom? yes      Handrails on the stairs?   yes        Adequate lighting?   yes  Patient rating of health (0-10) scale: 5   Depression Screen PHQ 2/9 Scores 07/04/2021 03/26/2021 01/25/2021 09/14/2020  PHQ - 2 Score 0 0 0 0  PHQ- 9 Score - _0 Cognitive Function   6CIT Screen 07/04/2021  What  Year? 0 points  What month? 0 points  What time? 0 points  Count back from 20 0 points  Months in reverse 2 points  Repeat phrase 0 points  Total Score 2   Immunization History  Administered Date(s) Administered   H1N1 09/28/2008   Influenza Split 09/13/2012   Influenza Whole 09/16/2005, 10/03/2007, 08/08/2009, 08/13/2010   Influenza, High Dose Seasonal PF 07/13/2017   Influenza,inj,Quad PF,6+ Mos 07/07/2013, 10/02/2014, 11/26/2015, 08/01/2016, 07/14/2018, 08/05/2019, 09/14/2020   Influenza-Unspecified 07/13/2017   PFIZER Comirnaty(Gray Top)Covid-19 Tri-Sucrose Vaccine 03/26/2021   PFIZER(Purple Top)SARS-COV-2 Vaccination 03/27/2020, 04/23/2020   Pneumococcal Conjugate-13 11/26/2015   Pneumococcal Polysaccharide-23 06/02/2004, 02/24/2018   Td 06/12/2004   Tdap 01/25/2021   Screening Tests Health Maintenance  Topic Date Due   Zoster Vaccines- Shingrix (1 of 2) Never done   COLONOSCOPY (Pts 45-66yr Insurance coverage will need to be confirmed)  08/08/2019   FOOT EXAM  09/12/2020   INFLUENZA VACCINE  05/27/2021   COVID-19 Vaccine (4 - Booster for Pfizer series) 06/18/2021   HEMOGLOBIN A1C  07/27/2021   OPHTHALMOLOGY EXAM  06/11/2022   TETANUS/TDAP  01/26/2031   DEXA SCAN  Completed   Hepatitis C Screening  Completed   PNA vac Low Risk Adult  Completed   HPV VACCINES  Aged Out   Qualifies for Shingles Vaccine? Yes   Zostavax completed No   Shingrix Completed?: No.    Education has been provided regarding the importance of this vaccine. Patient has been advised to call insurance company to determine out of pocket expense if they have not yet received this vaccine. Advised may also receive vaccine at local pharmacy or Health Dept. Verbalized acceptance and understanding.  Cancer Screenings: Lung: Low Dose CT Chest recommended if Age 741-80years, 30 pack-year currently smoking OR have quit w/in 15years. Patient does not qualify. Breast:  Up to date on Mammogram? 08/12/2016 Up  to date of Bone Density/Dexa? Apt scheduled for 10/15/2021 Colorectal: 08/07/2014- patient plans to schedule colonoscopy once she can find someone to go with her.  Additional Screenings: Hepatitis C Screening: Completed  HIV Screening: Completed    Plan:  PCP apt scheduled for 9/22. Plan to get your flu vaccine at PCP apt. You are eligible for the NEW covid booster. You can get this at your pharmacy.  I have personally reviewed and noted the following in the patient's chart:   Medical and social history Use of alcohol, tobacco or illicit drugs  Current medications and supplements Functional ability and status Nutritional status Physical activity Advanced directives List of other physicians Hospitalizations, surgeries, and ER visits in previous 12 months Vitals Screenings to include cognitive, depression, and falls Referrals and appointments  In addition, I have reviewed and discussed with patient certain preventive protocols, quality metrics, and best practice recommendations. A written personalized care plan for preventive services as well as general preventive health recommendations were provided to patient.  This visit was conducted virtually in the setting of the [place].    EDorna Bloom CDudley 07/05/2021     I have reviewed this visit and agree with the documentation.  CDorris Singh MD  Family Medicine Teaching Service

## 2021-07-05 NOTE — Patient Instructions (Addendum)
You spoke to [name], [place] over the phone for your annual wellness visit.  We discussed goals:   Goals      Blood Pressure < 140/90     HEMOGLOBIN A1C < 7     7.8 01/2021       We also discussed recommended health maintenance. As discussed, you are due for:  Health Maintenance  Topic Date Due   Zoster Vaccines- Shingrix (1 of 2) Never done   COLONOSCOPY (Pts 45-53yrs Insurance coverage will need to be confirmed)  08/08/2019   FOOT EXAM  09/12/2020   INFLUENZA VACCINE  05/27/2021   COVID-19 Vaccine (4 - Booster for Pfizer series) 06/18/2021   HEMOGLOBIN A1C  07/27/2021   OPHTHALMOLOGY EXAM  06/11/2022   TETANUS/TDAP  01/26/2031   DEXA SCAN  Completed   Hepatitis C Screening  Completed   PNA vac Low Risk Adult  Completed   HPV VACCINES  Aged Out   PCP apt scheduled for 9/22. Plan to get your flu vaccine at PCP apt. You are eligible for the NEW covid booster. You can get this at your pharmacy.  Preventive Care 33 Years and Older, Female Preventive care refers to lifestyle choices and visits with your health care provider that can promote health and wellness. This includes: A yearly physical exam. This is also called an annual wellness visit. Regular dental and eye exams. Immunizations. Screening for certain conditions. Healthy lifestyle choices, such as: Eating a healthy diet. Getting regular exercise. Not using drugs or products that contain nicotine and tobacco. Limiting alcohol use. What can I expect for my preventive care visit? Physical exam Your health care provider will check your: Height and weight. These may be used to calculate your BMI (body mass index). BMI is a measurement that tells if you are at a healthy weight. Heart rate and blood pressure. Body temperature. Skin for abnormal spots. Counseling Your health care provider may ask you questions about your: Past medical problems. Family's medical history. Alcohol, tobacco, and drug  use. Emotional well-being. Home life and relationship well-being. Sexual activity. Diet, exercise, and sleep habits. History of falls. Memory and ability to understand (cognition). Work and work Statistician. Pregnancy and menstrual history. Access to firearms. What immunizations do I need? Vaccines are usually given at various ages, according to a schedule. Your health care provider will recommend vaccines for you based on your age, medical history, and lifestyle or other factors, such as travel or where you work. What tests do I need? Blood tests Lipid and cholesterol levels. These may be checked every 5 years, or more often depending on your overall health. Hepatitis C test. Hepatitis B test. Screening Lung cancer screening. You may have this screening every year starting at age 74 if you have a 30-pack-year history of smoking and currently smoke or have quit within the past 15 years. Colorectal cancer screening. All adults should have this screening starting at age 71 and continuing until age 71. Your health care provider may recommend screening at age 29 if you are at increased risk. You will have tests every 1-10 years, depending on your results and the type of screening test. Diabetes screening. This is done by checking your blood sugar (glucose) after you have not eaten for a while (fasting). You may have this done every 1-3 years. Mammogram. This may be done every 1-2 years. Talk with your health care provider about how often you should have regular mammograms. Abdominal aortic aneurysm (AAA) screening. You may  need this if you are a current or former smoker. BRCA-related cancer screening. This may be done if you have a family history of breast, ovarian, tubal, or peritoneal cancers. Other tests STD (sexually transmitted disease) testing, if you are at risk. Bone density scan. This is done to screen for osteoporosis. You may have this done starting at age 71. Talk with your  health care provider about your test results, treatment options, and if necessary, the need for more tests. Follow these instructions at home: Eating and drinking  Eat a diet that includes fresh fruits and vegetables, whole grains, lean protein, and low-fat dairy products. Limit your intake of foods with high amounts of sugar, saturated fats, and salt. Take vitamin and mineral supplements as recommended by your health care provider. Do not drink alcohol if your health care provider tells you not to drink. If you drink alcohol: Limit how much you have to 0-1 drink a day. Be aware of how much alcohol is in your drink. In the [place] one drink equals one 12 oz bottle of beer (355 mL), one 5 oz glass of wine (148 mL), or one 1 oz glass of hard liquor (44 mL). Lifestyle Take daily care of your teeth and gums. Brush your teeth every morning and night with fluoride toothpaste. Floss one time each day. Stay active. Exercise for at least 30 minutes 5 or more days each week. Do not use any products that contain nicotine or tobacco, such as cigarettes, e-cigarettes, and chewing tobacco. If you need help quitting, ask your health care provider. Do not use drugs. If you are sexually active, practice safe sex. Use a condom or other form of protection in order to prevent STIs (sexually transmitted infections). Talk with your health care provider about taking a low-dose aspirin or statin. Find healthy ways to cope with stress, such as: Meditation, yoga, or listening to music. Journaling. Talking to a trusted person. Spending time with friends and family. Safety Always wear your seat belt while driving or riding in a vehicle. Do not drive: If you have been drinking alcohol. Do not ride with someone who has been drinking. When you are tired or distracted. While texting. Wear a helmet and other protective equipment during sports activities. If you have firearms in your house, make sure you follow all gun  safety procedures. What's next? Visit your health care provider once a year for an annual wellness visit. Ask your health care provider how often you should have your eyes and teeth checked. Stay up to date on all vaccines. This information is not intended to replace advice given to you by your health care provider. Make sure you discuss any questions you have with your health care provider. Document Revised: 12/21/2020 Document Reviewed: 10/07/2018 Elsevier Patient Education  2022 [employer].  Our clinic's number is [phone]. Please call with questions or concerns about what we discussed today.

## 2021-07-18 ENCOUNTER — Ambulatory Visit (INDEPENDENT_AMBULATORY_CARE_PROVIDER_SITE_OTHER): Payer: 59 | Admitting: Family Medicine

## 2021-07-18 ENCOUNTER — Other Ambulatory Visit: Payer: Self-pay

## 2021-07-18 VITALS — BP 148/82 | HR 72 | Ht 61.0 in | Wt 166.6 lb

## 2021-07-18 DIAGNOSIS — R296 Repeated falls: Secondary | ICD-10-CM

## 2021-07-18 DIAGNOSIS — R0600 Dyspnea, unspecified: Secondary | ICD-10-CM

## 2021-07-18 DIAGNOSIS — I10 Essential (primary) hypertension: Secondary | ICD-10-CM

## 2021-07-18 DIAGNOSIS — G3184 Mild cognitive impairment, so stated: Secondary | ICD-10-CM | POA: Insufficient documentation

## 2021-07-18 DIAGNOSIS — E119 Type 2 diabetes mellitus without complications: Secondary | ICD-10-CM

## 2021-07-18 DIAGNOSIS — R600 Localized edema: Secondary | ICD-10-CM

## 2021-07-18 DIAGNOSIS — R0609 Other forms of dyspnea: Secondary | ICD-10-CM

## 2021-07-18 DIAGNOSIS — R413 Other amnesia: Secondary | ICD-10-CM

## 2021-07-18 LAB — POCT GLYCOSYLATED HEMOGLOBIN (HGB A1C): HbA1c, POC (controlled diabetic range): 8.9 % — AB (ref 0.0–7.0)

## 2021-07-18 MED ORDER — METFORMIN HCL ER (MOD) 1000 MG PO TB24: ORAL_TABLET | ORAL | 1 refills | Status: DC

## 2021-07-18 MED ORDER — GABAPENTIN 100 MG PO CAPS: 100.0000 mg | ORAL_CAPSULE | Freq: Every day | ORAL | 0 refills | Status: DC

## 2021-07-18 NOTE — Assessment & Plan Note (Signed)
Endorses changes to memory that has made it difficult to keep track of medications among other things. Is amenable to appointment with Geriatric clinic to assess extent of memory changes. Will schedule appointment with Dr. McDiarmid for Geriatric clinic. Will request patient bring all her medications to that clinic.

## 2021-07-18 NOTE — Assessment & Plan Note (Signed)
Denies any dyspnea today. Echo from 05/24/21 indicates ejection fraction of 65% and no concern for heart failure. Discussed removing lasix from medication list given impact to patient quality life and resolved symptoms. Obtaining CMP today to assess electrolyte status. Will address any imbalance.

## 2021-07-18 NOTE — Progress Notes (Addendum)
SUBJECTIVE:   CHIEF COMPLAINT / HPI:   [name] presents today for follow-up.  Diabetes - HbA1C is elevated at 8.9 today from last visit. She is currently taking metformin 500 mg daily. She reports she hasn't been able to exercise due to leg pain and has not been eating much.  Hypertension - BP in office today was 148/82. Currently taking amlodipine 10 mg, losartan 25mg . She does have other medications prescribed but it is unclear if she is taking them based on refill requests.   Dyspnea - Denies any shortness of breath.  Would like to stop take  'pee ' pill.  Last ECHO shows LVEF 60-65% and normal LV function.  No valvular disease.  Leg pain/walker - Endorses right leg pain from knee downwards. Takes tylenol occasionaly but does not find any relief. Is not able to walk as far and would like walker to assist.   Med rec - She is not sure what she is taking and is unfamiliar with some of the medications.   Memory - Endorses feelings of losing memory when asked. She is ok with getting evaluated for memory.  Appetite - [name] endorses poor appetite. She usually only eats one meal and it is typically cornflakes and cheerios when she remembers all of a sudden before she goes to bed.   PERTINENT  PMH / PSH:  T2D COPD Migraine Essential HTN  OBJECTIVE:   BP (!) 148/82   Pulse 72   Ht 5\' 1"  (1.549 m)   Wt 166 lb 9.6 oz (75.6 kg)   SpO2 98%   BMI 31.48 kg/m   GEN: Well appearing, in no acute distress CARD: RRR, no murmurs or gallops PULM: Clear to auscultation, normal work of breathing, no dyspnea EXTREMITIES: Slight edema limited to mid-calf and lower, no cyanosis NEUR: Grossly normal.  ASSESSMENT/PLAN:   Dyspnea on exertion Denies any dyspnea today. Echo from 05/24/21 indicates ejection fraction of 65% and no concern for heart failure. Discussed removing lasix from medication list given impact to patient quality life and resolved symptoms. Obtaining CMP today to assess  electrolyte status. Will address any imbalance.  Essential hypertension, benign Moderately controlled hypertension of 148/82. Is on several antihypertensive medications but is unclear which she is taking due to challenges with memory and historical adherence issues. Attempted to call pharmacy to determine recent medication refills to facilitate med rec. Discussed removing amlodipine from regimen to alleviate leg swelling and replacing with another medication. Will obtain labs today to assess electrolyte status. Will call patient to adjust medication after results return.  Well controlled type 2 diabetes mellitus ([place]) Poorly controlled with HbA1C of 8.9, increased from 7.8 a few months ago. Appetite is poor and she typically consumes a handful of cornflakes as her meal in one day. Provided education on lifestyle modifications and suggestions to implement healthier diet. Increasing metformin 500 mg BID to 1000 mg BID to control sugars. Will consider additional medication once lab results return.  Memory change Endorses changes to memory that has made it difficult to keep track of medications among other things. Is amenable to appointment with Geriatric clinic to assess extent of memory changes. Will schedule appointment with Dr. McDiarmid for Geriatric clinic. Will request patient bring all her medications to that clinic.  Bilateral lower extremity edema Suspect it is due to amlodipine. Removing Lasix from regimen.    Health Maintenance Scheduled appointment with Olivia Mackie for medicine reconciliation due to patient's challenge to recall current medications. Follow with Shingles vaccine  [place]

## 2021-07-18 NOTE — Patient Instructions (Signed)
Thank you for coming to see me today. It was a pleasure. T  Increase your Metformin to 1000 mg two times a day  You should pay attention to your hemoglobin A1C.  It is a three month test about your average blood sugar. If the A1C is - <7.0 is great.  That is our goal for treating you. - Between 7.0 and 9.0 is not so good.  We would need to work to do better. - Above 9.0 is terrible.  You would really need to work with [place] to get it under control.    Today's A1C = 8.9  Monitor your blood pressure 2-3 times a week and record.  Bring readings to your next visit for review.  Referral to Nurse to help with medications Referral to Geriatric Clinic to check your memory and review your medications   Please follow-up with PCP in 2 weeks  If you have any questions or concerns, please do not hesitate to call the office at [phone].  Best,   Carollee Leitz, MD

## 2021-07-18 NOTE — Assessment & Plan Note (Signed)
Suspect it is due to amlodipine. Removing Lasix from regimen.

## 2021-07-18 NOTE — Assessment & Plan Note (Signed)
Poorly controlled with HbA1C of 8.9, increased from 7.8 a few months ago. Appetite is poor and she typically consumes a handful of cornflakes as her meal in one day. Provided education on lifestyle modifications and suggestions to implement healthier diet. Increasing metformin 500 mg BID to 1000 mg BID to control sugars. Will consider additional medication once lab results return.

## 2021-07-18 NOTE — Assessment & Plan Note (Signed)
Moderately controlled hypertension of 148/82. Is on several antihypertensive medications but is unclear which she is taking due to challenges with memory and historical adherence issues. Attempted to call pharmacy to determine recent medication refills to facilitate med rec. Discussed removing amlodipine from regimen to alleviate leg swelling and replacing with another medication. Will obtain labs today to assess electrolyte status. Will call patient to adjust medication after results return.

## 2021-07-19 ENCOUNTER — Encounter: Payer: Self-pay | Admitting: Family Medicine

## 2021-07-19 LAB — COMPREHENSIVE METABOLIC PANEL
ALT: 11 IU/L (ref 0–32)
AST: 16 IU/L (ref 0–40)
Albumin/Globulin Ratio: 1.8 (ref 1.2–2.2)
Albumin: 4.2 g/dL (ref 3.7–4.7)
Alkaline Phosphatase: 101 IU/L (ref 44–121)
BUN/Creatinine Ratio: 9 — ABNORMAL LOW (ref 12–28)
BUN: 9 mg/dL (ref 8–27)
Bilirubin Total: 0.6 mg/dL (ref 0.0–1.2)
CO2: 24 mmol/L (ref 20–29)
Calcium: 9.5 mg/dL (ref 8.7–10.3)
Chloride: 104 mmol/L (ref 96–106)
Creatinine, Ser: 1.01 mg/dL — ABNORMAL HIGH (ref 0.57–1.00)
Globulin, Total: 2.3 g/dL (ref 1.5–4.5)
Glucose: 183 mg/dL — ABNORMAL HIGH (ref 65–99)
Potassium: 4.9 mmol/L (ref 3.5–5.2)
Sodium: 142 mmol/L (ref 134–144)
Total Protein: 6.5 g/dL (ref 6.0–8.5)
eGFR: 60 mL/min/{1.73_m2} (ref >59)

## 2021-07-19 LAB — CBC
Hematocrit: 37.7 % (ref 34.0–46.6)
Hemoglobin: 12.4 g/dL (ref 11.1–15.9)
MCH: 28 pg (ref 26.6–33.0)
MCHC: 32.9 g/dL (ref 31.5–35.7)
MCV: 85 fL (ref 79–97)
Platelets: 219 10*3/uL (ref 150–450)
RBC: 4.43 x10E6/uL (ref 3.77–5.28)
RDW: 13.3 % (ref 11.7–15.4)
WBC: 6.7 10*3/uL (ref 3.4–10.8)

## 2021-07-22 ENCOUNTER — Telehealth: Payer: Self-pay | Admitting: *Deleted

## 2021-07-22 NOTE — Chronic Care Management (AMB) (Signed)
  Care Management   Outreach Note  07/22/2021 Name: [name] MRN: [id number] DOB: [birth year]  Referred by: Carollee Leitz, MD Reason for referral : Care Coordination (Initial outreach to schedule referral with [facility] )   An unsuccessful telephone outreach was attempted today. The patient was referred to the case management team for assistance with care management and care coordination.   Follow Up Plan:  A HIPAA compliant phone message was left for the patient providing contact information and requesting a return call. The care management team will reach out to the patient again over the next 7 days. If patient returns call to provider office, please advise to call [place] at [phone].  [place] [facility]  Direct Dial: [phone]

## 2021-07-24 ENCOUNTER — Other Ambulatory Visit (HOSPITAL_COMMUNITY): Payer: Self-pay | Admitting: Psychiatry

## 2021-07-24 DIAGNOSIS — F330 Major depressive disorder, recurrent, mild: Secondary | ICD-10-CM

## 2021-07-24 NOTE — Chronic Care Management (AMB) (Signed)
  Care Management   Note  07/24/2021 Name: [name] MRN: [id number] DOB: 31-May-1951  [name] is a 70 y.o. year old female who is a primary care patient of Carollee Leitz, MD. I reached out to [employer] by phone today in response to a referral sent by [name] PCP.    [name] was given information about care management services today including:  Care management services include personalized support from designated clinical staff supervised by her physician, including individualized plan of care and coordination with other care providers 24/7 contact phone numbers for assistance for urgent and routine care needs. The patient may stop care management services at any time by phone call to the office staff.  Patient agreed to services and verbal consent obtained.   Follow up plan: Telephone appointment with care management team member scheduled for:07/29/21   [place] [facility]  Direct Dial: [phone]

## 2021-07-29 ENCOUNTER — Other Ambulatory Visit: Payer: Self-pay | Admitting: Family Medicine

## 2021-07-29 ENCOUNTER — Ambulatory Visit: Payer: 59

## 2021-07-29 DIAGNOSIS — Z139 Encounter for screening, unspecified: Secondary | ICD-10-CM

## 2021-07-29 MED ORDER — SPIRONOLACTONE 25 MG PO TABS: 25.0000 mg | ORAL_TABLET | Freq: Every day | ORAL | 3 refills | Status: DC

## 2021-07-29 MED ORDER — LOSARTAN POTASSIUM 25 MG PO TABS: ORAL_TABLET | ORAL | 11 refills | Status: DC

## 2021-07-29 NOTE — Chronic Care Management (AMB) (Signed)
Chronic Care Management   CCM RN Visit Note  07/29/2021 Name: [name] MRN: [id number] DOB: [birth year]  Subjective: [name] is a 70 y.o. year old female who is a primary care patient of Carollee Leitz, MD. The care management team was consulted for assistance with disease management and care coordination needs.    Engaged with patient by telephone for initial visit in response to provider referral for case management and/or care coordination services.   Consent to Services:  The patient was given the following information about Chronic Care Management services today, agreed to services, and gave verbal consent: 1. CCM service includes personalized support from designated clinical staff supervised by the primary care provider, including individualized plan of care and coordination with other care providers 2. 24/7 contact phone numbers for assistance for urgent and routine care needs. 3. Service will only be billed when office clinical staff spend 20 minutes or more in a month to coordinate care. 4. Only one practitioner may furnish and bill the service in a calendar month. 5.The patient may stop CCM services at any time (effective at the end of the month) by phone call to the office staff. 6. The patient will be responsible for cost sharing (co-pay) of up to 20% of the service fee (after annual deductible is met). Patient agreed to services and consent obtained.  Patient agreed to services and verbal consent obtained.    Assessment:  The patient continues to experience difficulty with her medications.. See Care Plan below for interventions and patient self-care actives. Follow up Plan: Patient would like continued follow-up.  CCM RNCM will outreach the patient within the next 2 weeks.  Patient will call office if needed prior to next encounter Review of patient past medical history, allergies, medications, health status, including review of consultants reports, laboratory  and other test data, was performed as part of comprehensive evaluation and provision of chronic care management services.   SDOH (Social Determinants of Health) assessments and interventions performed:  SDOH Interventions    Flowsheet Row Most Recent Value  SDOH Interventions   Transportation Interventions --  [will make a referral to the care guides]        CCM Care Plan  Allergies  Allergen Reactions   Aspirin Other (See Comments)    Dr told pt not to take asa d/t bleeding ulcer in 1985.   Penicillins Nausea Only    Tablets only.  Denies any airway involvement.     Latex Rash    Relieved with over the counter medications.    Outpatient Encounter Medications as of 07/29/2021  Medication Sig Note   alendronate (FOSAMAX) 70 MG tablet Take 1 tablet (70 mg total) by mouth every 7 (seven) days. Take with a full glass of water on an empty stomach.    amLODipine (NORVASC) 10 MG tablet TAKE 1 TABLET(10 MG) BY MOUTH AT BEDTIME    atorvastatin (LIPITOR) 80 MG tablet Take 1 tablet (80 mg total) by mouth daily. 07/29/2021: Patient states that she is out   buPROPion (WELLBUTRIN XL) 300 MG 24 hr tablet Take 1 tablet (300 mg total) by mouth every morning.    clobetasol ointment (TEMOVATE) 0.05 % APPLY TOPICALLY TO THE AFFECTED AREA TWICE DAILY    cloNIDine (CATAPRES) 0.2 MG tablet Take 0.5 tablets (0.1 mg total) by mouth 2 (two) times daily.    clotrimazole (LOTRIMIN) 1 % cream Apply 1 application topically 2 (two) times daily.    ezetimibe (ZETIA) 10 MG tablet  TAKE 1 TABLET(10 MG) BY MOUTH DAILY    fluticasone (FLONASE) 50 MCG/ACT nasal spray PLACE 2 SPRAYS DAILY INTO EACH NOSTRIL AS NEEDED    hydrochlorothiazide (HYDRODIURIL) 12.5 MG tablet Take 1 tablet (12.5 mg total) by mouth daily.    latanoprost (XALATAN) 0.005 % ophthalmic solution 1 drop at bedtime.    LINZESS 290 MCG CAPS capsule TAKE 1 CAPSULE BY MOUTH DAILY BEFORE BREAKFAST    metFORMIN (GLUMETZA) 1000 MG (MOD) 24 hr tablet Take 1  tablet two times a day with meals    mirtazapine (REMERON) 30 MG tablet TAKE 1 TABLET(30 MG) BY MOUTH AT BEDTIME    mupirocin ointment (BACTROBAN) 2 % APPLY EXTERNALLY TO THE AFFECTED AREA THREE TIMES DAILY    propranolol (INDERAL) 40 MG tablet Take 1 tablet (40 mg total) by mouth 2 (two) times daily.    triamcinolone ointment (KENALOG) 0.5 % Apply 1 application topically 2 (two) times daily. Do not use for more than 1 week at a time.    Blood Pressure Monitoring (BLOOD PRESSURE KIT) DEVI Please measure BP at same time each day    gabapentin (NEURONTIN) 100 MG capsule Take 1 capsule (100 mg total) by mouth at bedtime. 07/29/2021: Has not received it yet   loratadine (CLARITIN REDITABS) 10 MG dissolvable tablet Take 1 tablet (10 mg total) by mouth daily. As needed for allergy symptoms    [DISCONTINUED] losartan (COZAAR) 25 MG tablet TAKE 1 TABLET(25 MG) BY MOUTH DAILY    [DISCONTINUED] spironolactone (ALDACTONE) 25 MG tablet Take 1 tablet (25 mg total) by mouth at bedtime.    No facility-administered encounter medications on file as of 07/29/2021.    Patient Active Problem List   Diagnosis Date Noted   Memory change 07/18/2021   Dyspnea on exertion 05/22/2021   Bilateral lower extremity edema 01/26/2021   Cellulitis of left upper extremity 02/09/2020   Allergy to environmental factors 02/09/2020   Irritant contact dermatitis due to detergent 11/22/2019   Plantar wart of right foot 09/14/2019   Mixed urge and stress incontinence 09/07/2018   Dizziness 09/07/2018   Vaginal irritation 07/15/2018   Well controlled type 2 diabetes mellitus ([place]) 02/24/2018   HSV-2 (herpes simplex virus 2) infection 02/29/2016   Seborrheic keratoses, inflamed 02/29/2016   Itching in the vaginal area 05/17/2015   Cough 10/02/2014   Allergic rhinitis 04/25/2014   Skin lesions 12/08/2013   Severe episode of recurrent major depressive disorder ([place]) 09/08/2012   Healthcare maintenance 06/13/2012   Vitamin D  deficiency 06/13/2012   Sleep difficulties 06/13/2012   Hyperlipidemia - Mixed 05/17/2012   Migraine 01/20/2012   COPD (chronic obstructive pulmonary disease) ([place]) 03/05/2011   Bilateral carpal tunnel syndrome 10/27/2010   History of tobacco abuse 08/18/2008   BACK PAIN WITH RADICULOPATHY 08/17/2008   Essential hypertension, benign 11/05/2007   GERD 11/05/2007    Conditions to be addressed/monitored: Medication Management  Care Plan : RN Case Manager  Updates made by Lazaro Arms, RN since 07/29/2021 12:00 AM     Problem: Medication Management      Goal: The patient will gain techinquies and skills to help with taking the right medications at the prescribed time and dosage   Start Date: 07/29/2021  Expected End Date: 10/25/2021  Priority: High  Note:   Current Barriers:  Knowledge Deficits related to plan of care for management of HTN  Transportation barriers Cognitive Deficits  RNCM Clinical Goal(s):  Patient will verbalize understanding of plan for management of medication with  techniques to help with memory issues  through collaboration with RN Care manager, provider, and care team.   Interventions: 1:1 collaboration with primary care provider regarding development and update of comprehensive plan of care as evidenced by provider attestation and co-signature Inter-disciplinary care team collaboration (see longitudinal plan of care) Evaluation of current treatment plan related to  self management and patient's adherence to plan as established by provider   07/29/21  (Status: New goal.) Evaluation of current treatment plan related to  manage medications ,  self-management and patient's adherence to plan as established by provider. Discussed plans with patient for ongoing care management follow up and provided patient with direct contact information for care management team Collaborated with Dr. Volanda Napoleon regarding Medications Will send the patient educational materials related  to HTN and [place] referral for Transportation Pharmacy referral for the patient to learn Pill box monthly use Discussed plans with patient for ongoing care management follow up and provided patient with direct contact information for care management team; I talked with [name] regarding her medications. She put all her medicines together, and we examined each bottle individually she read the to and spelled them out over the phone. I also spoke with Juliann Pulse at [facility] about the drugs in question. What I found out went as follows: Furosemide 20 mg 1 daily was stopped in September. I her place it aside until I call back. Gabapentin 100 mg 1 capsule daily [facility] said the drug was picked up or mailed to her on 9/28) Loratadine 10 mg 1 tablet daily, no refills. Losartan 25 mg 1 tablet daily; she has refills, and the pharmacy will refill Spironolactone 25 mg 1 daily no refills Valtrex, no refills will need to fill as needed. ( Per Dr. Volanda Napoleon) I talked with the patient about pill packaging, and she wanted to try that until she found out that [facility] does not offer that service. She does not want to leave the pharmacy. So she will try a pill box.    We also discussed her blood pressure and diabetes. She stated that she has a blood pressure monitor but no glucometer and doesn't know how to use either. I advised the patient to bring her monitor to her appointment on 08/06/21 with Dr. Volanda Napoleon and have them show her how to use it.   I talked with Dr. Volanda Napoleon and informed her of all the information we discussed and what I learned from the pharmacy. She will call in some prescriptions for the patient, and [name] will follow up with the [facility] clinic for memory Issues.   Patient Goals/Self-Care Activities: Patient will self administer medications as prescribed as evidenced by self report/primary caregiver report  Patient will attend all scheduled provider  appointments as evidenced by clinician review of documented attendance to scheduled appointments and patient/caregiver report Patient will call pharmacy for medication refills as evidenced by patient report and review of pharmacy fill history as appropriate Patient will call provider office for new concerns or questions as evidenced by review of documented incoming telephone call notes and patient report Patient will talk with her daughter about helping her with setting up her pill box monthly       Lazaro Arms RN, BSN, [facility] Care Management Coordinator [place] Phone: [phone] I Fax: [phone]

## 2021-07-29 NOTE — Patient Instructions (Signed)
[name]  it was nice speaking with you. Please call me directly [phone] if you have questions about the goals we discussed.   Goals Addressed               This Visit's Progress     Client will have the abillity to take Medications within at the right time and right doasage with memory techniques (pt-stated)            Timeframe:  Long-Range Goal Priority:  High Start Date:   07/29/21                          Expected End Date:   10/25/21                     Patient Goals/Self-Care Activities: Patient will self administer medications as prescribed as evidenced by self report/primary caregiver report  Patient will attend all scheduled provider appointments as evidenced by clinician review of documented attendance to scheduled appointments and patient/caregiver report Patient will call pharmacy for medication refills as evidenced by patient report and review of pharmacy fill history as appropriate Patient will call provider office for new concerns or questions as evidenced by review of documented incoming telephone call notes and patient report Patient will talk with her daughter about helping her with setting up her pill box monthly         Patient Care Plan: RN Case Manager     Problem Identified: Medication Management      Goal: The patient will gain techinquies and skills to help with taking the right medications at the prescribed time and dosage   Start Date: 07/29/2021  Expected End Date: 10/25/2021  Priority: High  Note:   Current Barriers:  Knowledge Deficits related to plan of care for management of HTN  Transportation barriers Cognitive Deficits  RNCM Clinical Goal(s):  Patient will verbalize understanding of plan for management of medication with techniques to help with memory issues  through collaboration with RN Care manager, provider, and care team.   Interventions: 1:1 collaboration with primary care provider regarding development and update  of comprehensive plan of care as evidenced by provider attestation and co-signature Inter-disciplinary care team collaboration (see longitudinal plan of care) Evaluation of current treatment plan related to  self management and patient's adherence to plan as established by provider   07/29/21  (Status: New goal.) Evaluation of current treatment plan related to  manage medications ,  self-management and patient's adherence to plan as established by provider. Discussed plans with patient for ongoing care management follow up and provided patient with direct contact information for care management team Collaborated with Dr. Volanda Napoleon regarding Medications Will send the patient educational materials related to HTN and [place] referral for Transportation Pharmacy referral for the patient to learn Pill box monthly use Discussed plans with patient for ongoing care management follow up and provided patient with direct contact information for care management team; I talked with [name] regarding her medications. She put all her medicines together, and we examined each bottle individually she read the to and spelled them out over the phone. I also spoke with [name] at [address] about the drugs in question. What I found out went as follows: Furosemide 20 mg 1 daily was stopped in September. I her place it aside until I call back. Gabapentin 100 mg 1 capsule daily [name] at [facility] said the drug was picked  up or mailed to her on 9/28) Loratadine 10 mg 1 tablet daily, no refills. Losartan 25 mg 1 tablet daily; she has refills, and the pharmacy will refill Spironolactone 25 mg 1 daily no refills Valtrex, no refills will need to fill as needed. ( Per Dr. Volanda Napoleon) I talked with the patient about pill packaging, and she wanted to try that until she found out that [facility] does not offer that service. She does not want to leave the pharmacy. So she will try a pill box.    We  also discussed her blood pressure and diabetes. She stated that she has a blood pressure monitor but no glucometer and doesn't know how to use either. I advised the patient to bring her monitor to her appointment on 08/06/21 with Dr. Volanda Napoleon and have them show her how to use it.   I talked with Dr. Volanda Napoleon and informed her of all the information we discussed and what I learned from the pharmacy. She will call in some prescriptions for the patient, and [name] will follow up with the [facility] clinic for memory Issues.   Patient Goals/Self-Care Activities: Patient will self administer medications as prescribed as evidenced by self report/primary caregiver report  Patient will attend all scheduled provider appointments as evidenced by clinician review of documented attendance to scheduled appointments and patient/caregiver report Patient will call pharmacy for medication refills as evidenced by patient report and review of pharmacy fill history as appropriate Patient will call provider office for new concerns or questions as evidenced by review of documented incoming telephone call notes and patient report Patient will talk with her daughter about helping her with setting up her pill box monthly        [name] received Care Management services today:  Care Management services include personalized support from designated clinical staff supervised by her physician, including individualized plan of care and coordination with other care providers 24/7 contact [phone] for assistance for urgent and routine care needs. Care Management are voluntary services and be declined at any time by calling the office.  The patient verbalized understanding of instructions provided today and declined a print copy of patient instruction materials.    Follow Up Plan: Patient would like continued follow-up.  CCM RNCM will outreach the patient within the next 2 weeks.  Patient will call office if needed prior to  next encounter   Emily Arms, RN   [phone]

## 2021-07-30 ENCOUNTER — Telehealth: Payer: Self-pay

## 2021-07-30 NOTE — Telephone Encounter (Signed)
   RN Case Manager Care Management   Phone Outreach    07/30/2021 Name: [name] MRN: [id number] DOB: Feb 21, 1951  [name] is a 70 y.o. year old female who is a primary care patient of Carollee Leitz, MD .   CCM RNCM reached out to the patient today to inform her that I talked with Dr. Volanda Napoleon about the Furosemide 20 mg 1 daily and explained to her she needs to stop the medication; it has been discontinued. I also asked when she goes for the appointment on 08/06/21 to see Dr. Volanda Napoleon gather her medications, and bring them to the appointment with her for review. She verbalized understanding.  Follow Up Plan: RNCM will follow up at the next scheduled Interval.  Review of patient status, including review of consultants reports, relevant laboratory and other test results, and collaboration with appropriate care team members and the patient's provider was performed as part of comprehensive patient evaluation and provision of care management services.    Lazaro Arms RN, BSN, [facility] Care Management Coordinator [place] Phone: [phone] I Fax: [phone]

## 2021-08-06 ENCOUNTER — Ambulatory Visit: Payer: 59 | Admitting: Family Medicine

## 2021-08-12 ENCOUNTER — Telehealth: Payer: 59

## 2021-08-12 ENCOUNTER — Telehealth: Payer: Self-pay

## 2021-08-12 NOTE — Telephone Encounter (Signed)
   RN Case Manager Care Management   Phone Outreach    08/12/2021 Name: [name] MRN: [id number] DOB: 28-Feb-1951  [name] is a 70 y.o. year old female who is a primary care patient of Carollee Leitz, MD .   Telephone outreach was unsuccessful A HIPPA compliant phone message was left for the patient providing contact information and requesting a return call.   Plan:Will route chart to Care Guide to see if patient would like to reschedule phone appointment   Review of patient status, including review of consultants reports, relevant laboratory and other test results, and collaboration with appropriate care team members and the patient's provider was performed as part of comprehensive patient evaluation and provision of care management services.    Lazaro Arms RN, BSN, [facility] Care Management Coordinator [place] Phone: [phone] I Fax: [phone]

## 2021-08-13 ENCOUNTER — Telehealth: Payer: Self-pay | Admitting: *Deleted

## 2021-08-13 ENCOUNTER — Other Ambulatory Visit: Payer: Self-pay | Admitting: Family Medicine

## 2021-08-13 NOTE — Chronic Care Management (AMB) (Signed)
  Care Management   Note  08/13/2021 Name: [name] MRN: [id number] DOB: August 06, 1951  [name] is a 70 y.o. year old female who is a primary care patient of Carollee Leitz, MD and is actively engaged with the care management team. I reached out to [facility] by phone today to assist with re-scheduling a follow up visit with the RN Case Manager  Follow up plan: Unsuccessful telephone outreach attempt made. A HIPAA compliant phone message was left for the patient providing contact information and requesting a return call.  The care management team will reach out to the patient again over the next 7 days.  If patient returns call to provider office, please advise to call [place] at [phone].  [place] Management  Direct Dial: [phone]

## 2021-08-16 ENCOUNTER — Other Ambulatory Visit: Payer: Self-pay | Admitting: Family Medicine

## 2021-08-16 ENCOUNTER — Telehealth: Payer: Self-pay | Admitting: *Deleted

## 2021-08-16 ENCOUNTER — Other Ambulatory Visit (HOSPITAL_COMMUNITY): Payer: Self-pay | Admitting: Psychiatry

## 2021-08-16 DIAGNOSIS — I10 Essential (primary) hypertension: Secondary | ICD-10-CM

## 2021-08-16 DIAGNOSIS — F419 Anxiety disorder, unspecified: Secondary | ICD-10-CM

## 2021-08-16 DIAGNOSIS — F330 Major depressive disorder, recurrent, mild: Secondary | ICD-10-CM

## 2021-08-16 NOTE — Telephone Encounter (Signed)
   Telephone encounter was:  Successful.  08/16/2021 Name: [name] MRN: [id number] DOB: Dec 05, 1950  [name] is a 70 y.o. year old female who is a primary care patient of Carollee Leitz, MD . The community resource team was consulted for assistance with Transportation Needs Will mail the application [place] access to the patient as that is her needs she gets medication delivered and groceries as well   Care guide performed the following interventions: Patient provided with information about care guide support team and interviewed to confirm resource needs Follow up call placed to community resources to determine status of patients referral.  Follow Up Plan:  No further follow up planned at this time. The patient has been provided with needed resources. [place], Care Management  [phone] [address] , [place] [address] Email : [email or web address]

## 2021-08-20 NOTE — Chronic Care Management (AMB) (Signed)
  Care Management   Note  08/20/2021 Name: [name] MRN: [id number] DOB: Jun 14, 1951  [name] is a 70 y.o. year old female who is a primary care patient of Carollee Leitz, MD and is actively engaged with the care management team. I reached out to [facility] by phone today to assist with re-scheduling a follow up visit with the RN Case Manager  Follow up plan: A second unsuccessful telephone outreach attempt made. A HIPAA compliant phone message was left for the patient providing contact information and requesting a return call. The care management team will reach out to the patient again over the next 7 days. If patient returns call to provider office, please advise to call [place] at [phone].  [place] Management  Direct Dial: [phone]

## 2021-08-27 ENCOUNTER — Ambulatory Visit: Payer: 59 | Admitting: Family Medicine

## 2021-08-27 NOTE — Patient Instructions (Incomplete)
Thank you for coming to see me today. It was a pleasure.   Your blood pressure today is  Referral to Geriatric clinic to evaluate memory  Please follow-up with PCP in 3 months  If you have any questions or concerns, please do not hesitate to call the office at [phone].  Best,   Carollee Leitz, MD

## 2021-08-27 NOTE — Progress Notes (Deleted)
? ? ?  SUBJECTIVE:  ? ?CHIEF COMPLAINT / HPI:  ? ?*** ? ?PERTINENT  PMH / PSH: *** ? ?OBJECTIVE:  ? ?There were no vitals taken for this visit.  ? ?General: Alert, no acute distress ?Cardio: Normal S1 and S2, RRR, no r/m/g ?Pulm: CTAB, normal work of breathing ?Abdomen: Bowel sounds normal. Abdomen soft and non-tender.  ?Extremities: No peripheral edema.  ?Neuro: Cranial nerves grossly intact ?Diabetic foot exam: No deformities, ulcerations, or other skin breakdown on feet bilaterally.  Sensation intact to monofilament and light touch.  PT and DP pulses intact BL.  ? ? ?ASSESSMENT/PLAN:  ? ?No problem-specific Assessment & Plan notes found for this encounter. ?  ? ? ?Ariyon Mittleman, MD ?[facility]  ?

## 2021-08-27 NOTE — Chronic Care Management (AMB) (Signed)
  Care Management   Note  08/27/2021 Name: [name] MRN: [id number] DOB: 1951/05/29  [name] is a 70 y.o. year old female who is a primary care patient of Carollee Leitz, MD and is actively engaged with the care management team. I reached out to [facility] by phone today to assist with re-scheduling a follow up visit with the RN Case Manager  Follow up plan: Unsuccessful telephone outreach attempt made. A HIPAA compliant phone message was left for the patient providing contact information and requesting a return call. Unable to make contact on outreach attempts x 3. PCP Dr. Volanda Napoleon notified via routed documentation in medical record. We have been unable to make contact with the patient for follow up. The care management team is available to follow up with the patient after provider conversation with the patient regarding recommendation for care management engagement and subsequent re-referral to the care management team.  [place] Management  Direct Dial: [phone]

## 2021-08-29 ENCOUNTER — Ambulatory Visit (INDEPENDENT_AMBULATORY_CARE_PROVIDER_SITE_OTHER): Payer: 59

## 2021-08-29 ENCOUNTER — Ambulatory Visit (INDEPENDENT_AMBULATORY_CARE_PROVIDER_SITE_OTHER): Payer: 59 | Admitting: Family Medicine

## 2021-08-29 ENCOUNTER — Other Ambulatory Visit: Payer: Self-pay

## 2021-08-29 VITALS — BP 126/60 | HR 60 | Wt 159.0 lb

## 2021-08-29 DIAGNOSIS — K121 Other forms of stomatitis: Secondary | ICD-10-CM | POA: Diagnosis not present

## 2021-08-29 DIAGNOSIS — R0683 Snoring: Secondary | ICD-10-CM | POA: Insufficient documentation

## 2021-08-29 DIAGNOSIS — K5900 Constipation, unspecified: Secondary | ICD-10-CM | POA: Insufficient documentation

## 2021-08-29 DIAGNOSIS — R413 Other amnesia: Secondary | ICD-10-CM

## 2021-08-29 DIAGNOSIS — Z23 Encounter for immunization: Secondary | ICD-10-CM | POA: Diagnosis not present

## 2021-08-29 DIAGNOSIS — G3184 Mild cognitive impairment, so stated: Secondary | ICD-10-CM

## 2021-08-29 HISTORY — DX: Snoring: R06.83

## 2021-08-29 HISTORY — DX: Other forms of stomatitis: K12.1

## 2021-08-29 MED ORDER — LINACLOTIDE 145 MCG PO CAPS: 145.0000 ug | ORAL_CAPSULE | Freq: Every day | ORAL | 11 refills | Status: DC

## 2021-08-29 NOTE — Progress Notes (Signed)
S: Pharmacy consulted to complete medication reconciliation for geriatric clinic. Patient arrives in good spirits. She is not accompanied by anyone to today's visit. Medication reconciliation completed by patient who seems to have a good understanding of her medications. Her allergies, medication list, and preferred pharmacy are up to date.   Medication Issues Identified: 1. Patient reports not taking Fosamax due to being "scared of it." When asked what she means by this she said she is nervous to take anything on an empty stomach so she never started it.  2. She is taking clonidine 0.1 mg (1/2 tablet) in the morning and 0.2 mg (1 tablet) in the evening though the prescription is written as 0.5 mg BID. She says her doctor told her to take it this way. Per last office visit, it seems the patient was confused regarding her medications, particularly her antihypertensives. Other than clonidine, it appears she is taking these medications correctly and seems knowledgeable about them. She notes that her propranolol is for her blood pressure and migraines and that it has helped her migraines since starting it. She mentions that she has a BP cuff at home but doesn't know how to use it. She meant to bring it today but forgot it. Denies frequent symptoms of low BP but does report dizziness about twice a month.  3. She qualifies for but has not received annual flu vaccine, COVID bivalent booster, or Shingrix.  4. Adherence concerns: she reports missing her medications two days out of seven because she forgets to take them. She lives alone. She reports no issues with getting medications from the pharmacy who delivers it to her house and denies issues with cost or transportation in receiving her medications. She feels that her medications are working well for her. She tried using a pill box to organize her medications but she said this didn't work for her, and she ended up taking more medications than she was supposed to.  She writes on the top of her bottles if it is an AM, PM, or AM & PM medication so she can keep track of when to take them.  5. She would like to take a hair/skin/nails vitamin but she hasn't heard from her doctor if she can take it so she hasn't started it yet.    Plan: 1. Recommend discussing Fosamax with patient. Would also consider starting a calcium/vitamin D supplement.  2. Determine what the dose of clonidine is supposed to be and educate the patient on the correct dose. Educate patient on how to use BP cuff so she can monitor her BP at home. Depending on what her BP is, regimen could likely be consolidated. Recommend ambulatory BP monitor to see what home BP are and if can consolidate regimen.  3. Recommend receiving influenza vaccine, COVID booster, and Shingrix vaccines.  4. Discussed setting alarms on her phone - 1 in the morning and 1 in the evening - to remind her take her medications. She thinks this would be really helpful and is going to ask her daughter to help her set this up on her phone.  5. Counsel patient on whether she can use a hair/skin/nails vitamin.     Patient seen by: Rebbeca Paul, PharmD, PGY2 Pharmacy Resident, & Elyse Jarvis, PharmD Student

## 2021-08-29 NOTE — Assessment & Plan Note (Signed)
Patient presents with high risk for depression due to elevated geriatric depression score and PHQ-9 of 21 (answer to question 9 is 0). Patient has history of depression. Recommend she follows up with PCP for evaluation and treatment options.

## 2021-08-29 NOTE — Assessment & Plan Note (Signed)
Patient reports that she has longstanding history of constipation and has been placed on linzess in the past of 290 mcg. She reports that she cannot tolerate it as it causes intense sweating and cramping. Will decrease to linzess 145 mcg and reassess. She reports previous failure of fiber and miralax trials.

## 2021-08-29 NOTE — Patient Instructions (Addendum)
It was a pleasure to see you today!  For your teeth: we recommend you see Dr. Baltazar Najjar. You can reach his office by calling [phone]  [phone]. For snoring and sleep problems: we recommend a sleep study. You should receive a call to schedule this test in 1-2 weeks. Your blood pressure looks great today! Keep up the good work For constipation, decrease linzess to 15mcg Follow up with your primary care doctor in 3-4 weeks   Be Well,  Dr. Chauncey Reading

## 2021-08-29 NOTE — Assessment & Plan Note (Signed)
Patient reports that she has been told of loud snoring, sometimes gasping. She also finds that she wakes up frequently at night, then feels excessively tired throughout the day. Recommend sleep study and if OSA found, titration of CPAP, referral placed.

## 2021-08-29 NOTE — Progress Notes (Signed)
Provider:  Lissa Morales, MD Location:      Place of Service:     PCP: Carollee Leitz, MD Patient Care Team: Carollee Leitz, MD as PCP - General (Family Medicine) Rutherford Guys, MD as Consulting Physician (Ophthalmology) Pyrtle, Lajuan Lines, MD as Consulting Physician (Gastroenterology) Adele Schilder, Arlyce Harman, MD as Consulting Physician (Psychiatry) Lazaro Arms, RN as [place] Management  Extended Emergency Contact Information Primary Emergency Contact: [place] Phone: [phone] Relation: Daughter  Code Status: DNR Goals of Care: Advanced Directive information Advanced Directives 08/29/2021  Does Patient Have a Medical Advance Directive? No  Would patient like information on creating a medical advance directive? Yes (MAU/Ambulatory/Procedural Areas - Information given)  Some encounter information is confidential and restricted. Go to Review Flowsheets activity to see all data.     [facility]:   Patient is alone Primary caregiver:  patient Patient's Currently living arrangement:  patient Patient information was obtained from historical medical records, lab reports, and office notes  patient. History/Exam limitations:  none Primary Care Provider:   Dr. Volanda Napoleon Referring provider:  Dr. Volanda Napoleon Reason for referral:  patient request for memory trouble- she was getting up to do a task and kept forgetting the task  ----------------------------------------------------------------------------------------------------------------------------------------------------------------------------------------------------------------------------------------------------------------   HPI by problems:  Chief Complaint  Patient presents with   memory evaluation    Cognitive impairment concern  Are there problems with thinking?  Patient reports forgetting tasks when she gets up to do them  When were the changes first  noticed?  3 months  Did this change occur abruptly or gradually?  gradual  How have the changes progressed since then?  staying constant  Has there been any tremors or abnormal movements?  no  Have they had in hallucinations or delusions:  no  Have they appeared more anxious or sad lately?  no  Do they still have interests or activities they enjor doing?  no  How has their appetite been lately?  show no change  How has their sleep been lately?  Poor sleep, has been told she snores very loudly, sometimes wakes up suddenly, has trouble staying asleep, excessive day time sleepiness  Compared to 5 to 10 years ago, how is the patient at:  Problems with Judgment, e.g., problem making decisions, bad financial decisions, problems with thinking?  no  Less interested in hobbies or previously enjoyed activities?  Yes- used to go walking and stopped.  Problem remembering things about family and friends e.g. names,  occupations, birthdays, addresses?  no  Problem remembering conversations or news events a few days later?  no  Problem remembering what day and month it is? no  Problem with losing things?  no  Problem learning to use a new gadget or machine around the house, e.g., cell phones, computer, microwave, remote control?  no  Problem with handling money for shopping?  no  Problem handling financial matters, e.g. their pension, checking, credit cards, dealing with the bank?  no, she reports that she does all of her own bills except her daughter will sometimes pay for her bills (using daughter's money).  Problem with getting lost in familiar places?  no   Geriatric Depression Scale:  10 / 15  PHQ-9: [facility] Office Visit from 08/29/2021 in [place]  PHQ-9 Total Score 21        Outpatient Encounter Medications as of 08/29/2021  Medication Sig   acetaminophen (TYLENOL) 500 MG tablet  Take 1,000 mg by mouth every 8 (eight) hours  as needed for moderate pain.   amLODipine (NORVASC) 10 MG tablet TAKE 1 TABLET(10 MG) BY MOUTH AT BEDTIME   atorvastatin (LIPITOR) 80 MG tablet TAKE 1 TABLET(80 MG) BY MOUTH DAILY   buPROPion (WELLBUTRIN XL) 300 MG 24 hr tablet Take 1 tablet (300 mg total) by mouth every morning.   cloNIDine (CATAPRES) 0.2 MG tablet Take 0.5 tablets (0.1 mg total) by mouth 2 (two) times daily.   clotrimazole (LOTRIMIN) 1 % cream Apply 1 application topically 2 (two) times daily.   ezetimibe (ZETIA) 10 MG tablet TAKE 1 TABLET(10 MG) BY MOUTH DAILY   fluticasone (FLONASE) 50 MCG/ACT nasal spray PLACE 2 SPRAYS DAILY INTO EACH NOSTRIL AS NEEDED   gabapentin (NEURONTIN) 100 MG capsule Take 1 capsule (100 mg total) by mouth at bedtime.   hydrochlorothiazide (HYDRODIURIL) 12.5 MG tablet TAKE 1 TABLET(12.5 MG) BY MOUTH DAILY   latanoprost (XALATAN) 0.005 % ophthalmic solution Place 1 drop into both eyes at bedtime.   linaclotide (LINZESS) 145 MCG CAPS capsule Take 1 capsule (145 mcg total) by mouth daily before breakfast.   losartan (COZAAR) 25 MG tablet TAKE 1 TABLET(25 MG) BY MOUTH DAILY   metFORMIN (GLUMETZA) 1000 MG (MOD) 24 hr tablet Take 1 tablet two times a day with meals   mirtazapine (REMERON) 30 MG tablet TAKE 1 TABLET(30 MG) BY MOUTH AT BEDTIME   propranolol (INDERAL) 40 MG tablet Take 1 tablet (40 mg total) by mouth 2 (two) times daily.   spironolactone (ALDACTONE) 25 MG tablet Take 1 tablet (25 mg total) by mouth at bedtime.   [DISCONTINUED] LINZESS 290 MCG CAPS capsule TAKE 1 CAPSULE BY MOUTH DAILY BEFORE BREAKFAST   alendronate (FOSAMAX) 70 MG tablet Take 1 tablet (70 mg total) by mouth every 7 (seven) days. Take with a full glass of water on an empty stomach. (Patient not taking: No sig reported)   Blood Pressure Monitoring (BLOOD PRESSURE KIT) DEVI Please measure BP at same time each day   clobetasol ointment (TEMOVATE) 0.05 % APPLY TOPICALLY TO THE AFFECTED AREA TWICE DAILY (Patient not taking: No sig  reported)   loratadine (CLARITIN REDITABS) 10 MG dissolvable tablet Take 1 tablet (10 mg total) by mouth daily. As needed for allergy symptoms (Patient not taking: No sig reported)   triamcinolone ointment (KENALOG) 0.5 % Apply 1 application topically 2 (two) times daily. Do not use for more than 1 week at a time. (Patient not taking: No sig reported)   [DISCONTINUED] mupirocin ointment (BACTROBAN) 2 % APPLY EXTERNALLY TO THE AFFECTED AREA THREE TIMES DAILY (Patient not taking: Reported on 08/29/2021)   No facility-administered encounter medications on file as of 08/29/2021.    History Patient Active Problem List   Diagnosis Date Noted   Constipation 08/29/2021   Snoring 08/29/2021   Denture sore mouth 08/29/2021   Memory change 07/18/2021   Dyspnea on exertion 05/22/2021   Bilateral lower extremity edema 01/26/2021   Mixed urge and stress incontinence 09/07/2018   Well controlled type 2 diabetes mellitus ([place]) 02/24/2018   Allergic rhinitis 04/25/2014   Skin lesions 12/08/2013   Severe episode of recurrent major depressive disorder ([place]) 09/08/2012   Vitamin D deficiency 06/13/2012   Sleep difficulties 06/13/2012   Hyperlipidemia associated with type 2 diabetes mellitus ([place]) 05/17/2012   Migraine 01/20/2012   COPD (chronic obstructive pulmonary disease) ([place]) 03/05/2011   BACK PAIN WITH RADICULOPATHY 08/17/2008   Hypertension associated with diabetes ([place]) 11/05/2007  GERD 11/05/2007   Past Medical History:  Diagnosis Date   Adenomatous colon polyp    Allergy    Allergy to environmental factors 02/09/2020   Anxiety    Asthma    Back pain with radiation    radiopathy    Bilateral carpal tunnel syndrome 10/27/2010   Qualifier: Diagnosis of  By: Moshe Cipro MD, [name]     Cataract    FORMING    Cellulitis of left upper extremity 02/09/2020   COPD (chronic obstructive pulmonary disease) ([place])    Corneal abrasion, left 04/28/2013   Depression    Diabetes mellitus without  complication ([place])    ON METFORMIN    Dizziness 09/07/2018   GERD (gastroesophageal reflux disease)    Glaucoma    History of tobacco abuse 08/18/2008   HSV-2 (herpes simplex virus 2) infection 02/29/2016   Dx 2014, + serology   Hx of migraines    Hyperlipidemia    Hypertension    on meds   Nicotine addiction    PANIC ATTACK 11/05/2007   PELVIC  PAIN 08/13/2010   Plantar wart of right foot 09/14/2019   Urinary incontinence    Past Surgical History:  Procedure Laterality Date   ABDOMINAL HYSTERECTOMY     bleeding ulcer repair  1992   Open Ex Lap with perforation of duodenal ulcer with pertitonitis   [place]   left ankle surgery  2008   POLYPECTOMY     removal of mild ducts from left breast  [place] Bilateral 06/04/2010   TOTAL ABDOMINAL HYSTERECTOMY W/ [place]   x 2   Family History  Problem Relation Age of Onset   Heart disease Mother    Diabetes Mother    Lupus Mother    Colon polyps Mother    Tuberculosis Father    Depression Sister    Diabetes Brother    Colon polyps Maternal Aunt    Heart disease Maternal Grandmother    Heart attack Maternal Aunt    Stomach cancer Sister    Endometriosis Daughter    Diabetes Daughter    Colon cancer Neg Hx    Esophageal cancer Neg Hx    Pancreatic cancer Neg Hx    Rectal cancer Neg Hx    Social History   Socioeconomic History   Marital status: Legally Separated    Spouse name: Not on file   Number of children: 3   Years of education: 12   Highest education level: High school graduate  Occupational History   Occupation: retired   Occupation: Retired  Tobacco Use   Smoking status: Former    Packs/day: 0.25    Years: 42.00    Pack years: 10.50    Types: Cigarettes    Quit date: 07/06/2017    Years since quitting: 4.1   Smokeless tobacco: Never  Vaping Use   Vaping Use: Never used   Substance and Sexual Activity   Alcohol use: No    Alcohol/week: 0.0 standard drinks   Drug use: No   Sexual activity: Not Currently    Birth control/protection: Post-menopausal  Other Topics Concern   Not on file  Social History Narrative   Patient lives alone in [place].    Patient uses public transportation.    Patient enjoys reading and watching detective shows.    Social Determinants of Health  Financial Resource Strain: Low Risk    Difficulty of Paying Living Expenses: Not hard at all  Food Insecurity: No Food Insecurity   Worried About Programme researcher, broadcasting/film/video in the Last Year: Never true   Barista in the Last Year: Never true  Transportation Needs: Unmet Transportation Needs   Lack of Transportation (Medical): No   Lack of Transportation (Non-Medical): Yes  Physical Activity: Inactive   Days of Exercise per Week: 0 days   Minutes of Exercise per Session: 0 min  Stress: No Stress Concern Present   Feeling of Stress : Only a little  Social Connections: Socially Isolated   Frequency of Communication with Friends and Family: More than three times a week   Frequency of Social Gatherings with Friends and Family: More than three times a week   Attends Religious Services: Never   Database administrator or Organizations: No   Attends Engineer, structural: Never   Marital Status: Separated   Cardiovascular Risk Factors: Hypertension  Educational History: 12 years formal education Personal History of Seizures: No  Personal History of Stroke: No  Personal History of Head Trauma: No  Personal History of Psychiatric Disorders: No  Family History of Dementia: No    Basic Activities of Daily Living  Dressing: Self-care Eating: Self-care Ambulation: Self-care Toileting: Self-care Bathing: Self-care  Instrumental Activities of Daily Living Shopping: Self-care House/Yard Work: Designer, jewellery of medications: Self-care Finances:  Self-care Telephone: Self-care Transportation: Self-care  Mobility Assist Devices: No assist devices  Caregivers in home: none   Formal Home Health Assistance  Physical Therapy: no  Occupational Therapy: no             Home Aid / Personal Care Service: no             Homemaker services: no  FALLS in last five office visits:  Fall Risk  08/29/2021 07/29/2021 07/04/2021 09/06/2020 03/30/2020  Falls in the past year? 1 1 1  0 0  Comment - 2 weeks ago legs weak - - -  Number falls in past yr: 1 0 0 0 0  Injury with Fall? 1 0 0 - 0  Risk for fall due to : Impaired balance/gait Impaired balance/gait Impaired balance/gait - -  Follow up - Falls evaluation completed;Education provided;Falls prevention discussed - Falls evaluation completed -    Health Maintenance reviewed: Immunization History  Administered Date(s) Administered   Fluad Quad(high Dose 65+) 07/18/2021   H1N1 09/28/2008   Influenza Split 09/13/2012   Influenza Whole 09/16/2005, 10/03/2007, 08/08/2009, 08/13/2010   Influenza, High Dose Seasonal PF 07/13/2017   Influenza,inj,Quad PF,6+ Mos 07/07/2013, 10/02/2014, 11/26/2015, 08/01/2016, 07/14/2018, 08/05/2019, 09/14/2020   Influenza-Unspecified 07/13/2017   PFIZER Comirnaty(Gray Top)Covid-19 Tri-Sucrose Vaccine 03/26/2021   PFIZER(Purple Top)SARS-COV-2 Vaccination 03/27/2020, 04/23/2020   Pfizer Covid-19 Vaccine Bivalent Booster 54yrs & up 08/29/2021   Pneumococcal Conjugate-13 11/26/2015   Pneumococcal Polysaccharide-23 06/02/2004, 02/24/2018   Td 06/12/2004   Tdap 01/25/2021   Health Maintenance Topics with due status: Overdue     Topic Date Due   Zoster Vaccines- Shingrix Never done   COLONOSCOPY (Pts 45-36yrs Insurance coverage will need to be confirmed) 08/08/2019   FOOT EXAM 09/12/2020    Diet: Regular Nutritional supplements: none  Geriatric Syndromes: Constipation yes  Laxative use:no   Incontinence no  Nocturia: no Dizziness no   Syncope no  Visual  Impairment no   Hearing impairment no Dentures problems: yes- pain in upper dentures and cannot wear them due  to problem with extraction with upper teeth, asks for referral to oral surgeon Impaired Memory or Cognition yes   Sleep problems yes   Weight loss no Drug Misadventure: no   Joint pain: no Ankle edema: no History of UTIs: no   Vital Signs Weight: 159 lb (72.1 kg) Body mass index is 30.04 kg/m. CrCl cannot be calculated (Patient's most recent lab result is older than the maximum 21 days allowed.). Body surface area is 1.76 meters squared. Vitals:   08/29/21 1355  BP: 126/60  Pulse: 60  SpO2: 98%  Weight: 159 lb (72.1 kg)   Wt Readings from Last 3 Encounters:  08/29/21 159 lb (72.1 kg)  07/18/21 166 lb 9.6 oz (75.6 kg)  05/21/21 169 lb 3.2 oz (76.7 kg)   Hearing Screening  Method: Audiometry   500Hz  1000Hz  2000Hz  4000Hz   Right ear 25 25 25  40  Left ear 25 25 25  40   Vision Screening   Right eye Left eye Both eyes  Without correction 20/30 20/30 20/30   With correction       Physical Examination:  VS reviewed Physical Exam  Vitals:   08/29/21 1355  BP: 126/60  Pulse: 60  SpO2: 98%    HEENT: Bilateral EAC adequately patent, I.e., able to see TMs General physical exam: well-dressed and groomed. Pleasant and cooperative. Cardiac: Regular rate and rhythm without murmur. No carotid bruits Lungs: Clear to auscultation.   No other insights were derived from the general Exam   Parkinsonism Yes []  No [x]   Rigidity Ab nl ? Yes []  No [x]   Cranial Nerve Abnl ? Yes []  No [x]   Vestibulo-ocular reflex (HINTS)  Abnl ? Yes []  No [x]   Smooth Pursuit Abnl Yes []  No [x]   Sensory Abnl ? Yes []  No [x]   Gait Abnl ? [x]  slow, []  petit ped, []  heel past toe no present, []  forward stoop present, []  en bloc turn present, []  symmetric stance phase, []  leg pasts midline present []  push back postural Stability Abnl Reflexes Abnl ? Yes [x]  No []  Very brisk in LE  Babinski  Abnl Yes []  No []   Motor Abnl ? Yes []  No [x]   Tremor ? Yes []  No [x]     Mini-Mental State Examination or Montreal Cognitive Assessment:  Patient did  require additional cues or prompts to complete tasks. Patient was cooperative and attentive to testing tasks Patient did  appear motivated to perform well  6CIT Screen 07/04/2021  What Year? 0 points  What month? 0 points  What time? 0 points  Count back from 20 0 points  Months in reverse 2 points  Repeat phrase 0 points  Total Score 2    No flowsheet data found.      Montreal Cognitive Assessment  08/29/2021  Visuospatial/ Executive (0/5) 2  Naming (0/3) 2  Attention: Read list of digits (0/2) 2  Attention: Read list of letters (0/1) 1  Attention: Serial 7 subtraction starting at 100 (0/3) 0  Language: Repeat phrase (0/2) 2  Language : Fluency (0/1) 0  Abstraction (0/2) 2  Delayed Recall (0/5) 1  Orientation (0/6) 6  Total 18     Labs No components found for: VITAMIND  No results found for: VITAMINB12  No results found for: FOLATE  Lab Results  Component Value Date   TSH 0.880 03/26/2021    No results found for: RPR  Lab Results  Component Value Date   HIV NONREACTIVE 02/29/2016      Chemistry  Component Value Date/Time   NA 142 07/18/2021 1638   K 4.9 07/18/2021 1638   CL 104 07/18/2021 1638   CO2 24 07/18/2021 1638   BUN 9 07/18/2021 1638   CREATININE 1.01 (H) 07/18/2021 1638   CREATININE 1.07 (H) 09/04/2016 1619      Component Value Date/Time   CALCIUM 9.5 07/18/2021 1638   ALKPHOS 101 07/18/2021 1638   AST 16 07/18/2021 1638   ALT 11 07/18/2021 1638   BILITOT 0.6 07/18/2021 1638       CrCl cannot be calculated (Patient's most recent lab result is older than the maximum 21 days allowed.).   Lab Results  Component Value Date   HGBA1C 8.9 (A) 07/18/2021     @10RELATIVEDAYS @ Hearing Screening  Method: Audiometry   500Hz  1000Hz  2000Hz  4000Hz   Right ear 25 25 25  40  Left ear 25 25  25  40   Vision Screening   Right eye Left eye Both eyes  Without correction 20/30 20/30 20/30   With correction      Lab Results  Component Value Date   WBC 6.7 07/18/2021   HGB 12.4 07/18/2021   HCT 37.7 07/18/2021   MCV 85 07/18/2021   PLT 219 07/18/2021    No results found for this or any previous visit (from the past 24 hour(s)).  Imaging Head CT: none  Brain MRI: none   Personal Strengths Ability for insight  Capable of independent living  Communication skills  Physical Health  Supportive family/friends   Support System Strengths Family   Advanced Directives Code Status: unknown Advance Directives: needs help from SW to complete this   ------------------------------------------------------------------------------------------------------------------------------------------------------------------------------------------------------------------------------------------------------------------------------------------------------------------------------------------------------------------------------------------------------------------------------------------------------------------------------------------------------------  Assessment and Plan: Please see individual consultation notes from physical therapy, pharmacy and social work for today.    Problem List Items Addressed This Visit       Unprioritized   Snoring    Patient reports that she has been told of loud snoring, sometimes gasping. She also finds that she wakes up frequently at night, then feels excessively tired throughout the day. Recommend sleep study and if OSA found, titration of CPAP, referral placed.      Memory change - Primary   Relevant Orders   AMB Referral to [facility]   Denture sore mouth   Constipation    Patient reports that she has longstanding history of constipation and has been placed on linzess in the past of 290 mcg. She reports that she cannot tolerate it as it  causes intense sweating and cramping. Will decrease to linzess 145 mcg and reassess. She reports previous failure of fiber and miralax trials.      Relevant Medications   linaclotide (LINZESS) 145 MCG CAPS capsule   Other Visit Diagnoses     Loud snoring       Relevant Orders   Split night study      Constipation Patient reports that she has longstanding history of constipation and has been placed on linzess in the past of 290 mcg. She reports that she cannot tolerate it as it causes intense sweating and cramping. Will decrease to linzess 145 mcg and reassess. She reports previous failure of fiber and miralax trials.  Snoring Patient reports that she has been told of loud snoring, sometimes gasping. She also finds that she wakes up frequently at night, then feels excessively tired throughout the day. Recommend sleep study and if OSA found, titration of CPAP, referral placed.  DEPRESSION Patient presents with high risk for depression due to elevated  geriatric depression score and PHQ-9 of 21 (answer to question 9 is 0). Patient has history of depression. Recommend she follows up with PCP for evaluation and treatment options.  Primary Contact: Extended Emergency Contact Information Primary Emergency Contact: Lewis,Samantha  [place] Phone: [phone] Relation: Daughter  Patient to Follow up with  Dr.Walsh or [facility] in 1 month(s)  > 60 minutes face to face were spent in total with interdisciplinary discussion, patient and caretaker counseling and coordination of care took more than 20 minutes. The Geriatric interdisciplinary team meet to discuss the patient's assessment, problem list, and recommendations.  The interdisciplinary team consisted of representatives from medicine, pharmacy, physical therapy and social work. The interdisciplinary team meet with the patient and caretakers to review the team's findings, assessments, and  recommendations.

## 2021-08-30 ENCOUNTER — Telehealth: Payer: Self-pay | Admitting: *Deleted

## 2021-08-30 NOTE — Chronic Care Management (AMB) (Signed)
  Care Management   Outreach Note  08/30/2021 Name: [name] MRN: [id number] DOB: [birth year]  Referred by: Carollee Leitz, MD Reason for referral : Care Coordination (Initial outreach to schedule referral with Licensed Clinical SW )   An unsuccessful telephone outreach was attempted today. The patient was referred to the case management team for assistance with care management and care coordination.   Follow Up Plan:  A HIPAA compliant phone message was left for the patient providing contact information and requesting a return call. The care management team will reach out to the patient again over the next 7 days. If patient returns call to provider office, please advise to call [place] at [phone].  [facility]  Direct Dial: [phone]

## 2021-08-30 NOTE — Chronic Care Management (AMB) (Signed)
  Care Management   Note  08/30/2021 Name: [name] MRN: [id number] DOB: [birth year]  Sherren Kerns Botello is a 70 y.o. year old female who is a primary care patient of Carollee Leitz, MD. I reached out to [employer] by phone today in response to a referral sent by Ms. Anam S Hora's primary care provider.   Ms. Augusta was given information about care management services today including:  Care management services include personalized support from designated clinical staff supervised by her physician, including individualized plan of care and coordination with other care providers 24/7 contact phone numbers for assistance for urgent and routine care needs. The patient may stop care management services at any time by phone call to the office staff.  Patient agreed to services and verbal consent obtained.   Follow up plan: Telephone appointment with care management team member scheduled for:09/03/21  [facility] Management  Direct Dial: [phone]

## 2021-08-30 NOTE — Assessment & Plan Note (Signed)
[name]'s MoCA score of 18 out of 30 with her preservation of iADLs & ADL functional abilities is consitent with Mild Cognitive Impairment.  We discussed that MCI can progress to dementia at 3-5% annually.  Monitoring of the patient's iADLs and ADLs is more effective method of monitoring them for a transition to dementia.    We discussed lifestyle changes associated with slowing of memory decline, including mediterranean diet, exercise (including walking) and socializing.    Recommended patient address advanced planning for patient's financial estate, future legal questions of competency and desired medical care the patient desires currently for resuscitation and for future care, including health care power of attorney agent, should patient become severely incapacitated.  Patient and caregiver(s) questions were invited and answered as best as possible.  50 minutes face to face where spent in total with counseling / coordination of care took more than 50% of the total time. Counseling involved discussion of the cognitive test and function test results with patient. Discussion on diagnosis and natural history of dementia.

## 2021-08-30 NOTE — Progress Notes (Deleted)
Acute Office Visit  Subjective:    Patient ID: [name], female    DOB: 1950-11-28, 70 y.o.   MRN: [id number]  Chief Complaint  Patient presents with   memory evaluation    HPI Patient is in today for ***  Past Medical History:  Diagnosis Date   Adenomatous colon polyp    Allergy    Allergy to environmental factors 02/09/2020   Anxiety    Asthma    Back pain with radiation    radiopathy    Bilateral carpal tunnel syndrome 10/27/2010   Qualifier: Diagnosis of  By: Moshe Cipro MD, [name]     Cataract    FORMING    Cellulitis of left upper extremity 02/09/2020   COPD (chronic obstructive pulmonary disease) ([place])    Corneal abrasion, left 04/28/2013   Depression    Diabetes mellitus without complication ([place])    ON METFORMIN    Dizziness 09/07/2018   GERD (gastroesophageal reflux disease)    Glaucoma    History of tobacco abuse 08/18/2008   HSV-2 (herpes simplex virus 2) infection 02/29/2016   Dx 2014, + serology   Hx of migraines    Hyperlipidemia    Hypertension    on meds   Nicotine addiction    PANIC ATTACK 11/05/2007   PELVIC  PAIN 08/13/2010   Plantar wart of right foot 09/14/2019   Urinary incontinence     Past Surgical History:  Procedure Laterality Date   ABDOMINAL HYSTERECTOMY     bleeding ulcer repair  1992   Open Ex Lap with perforation of duodenal ulcer with pertitonitis   [place]   left ankle surgery  2008   POLYPECTOMY     removal of mild ducts from left breast  [place] Bilateral 06/04/2010   TOTAL ABDOMINAL HYSTERECTOMY W/ [place]   x 2    Family History  Problem Relation Age of Onset   Heart disease Mother    Diabetes Mother    Lupus Mother    Colon polyps Mother    Tuberculosis Father    Depression Sister    Diabetes Brother    Colon polyps Maternal Aunt    Heart disease Maternal  Grandmother    Heart attack Maternal Aunt    Stomach cancer Sister    Endometriosis Daughter    Diabetes Daughter    Colon cancer Neg Hx    Esophageal cancer Neg Hx    Pancreatic cancer Neg Hx    Rectal cancer Neg Hx     Social History   Socioeconomic History   Marital status: Legally Separated    Spouse name: Not on file   Number of children: 3   Years of education: 12   Highest education level: High school graduate  Occupational History   Occupation: retired   Occupation: Retired  Tobacco Use   Smoking status: Former    Packs/day: 0.25    Years: 42.00    Pack years: 10.50    Types: Cigarettes    Quit date: 07/06/2017    Years since quitting: 4.1   Smokeless tobacco: Never  Vaping Use   Vaping Use: Never used  Substance and Sexual Activity   Alcohol use: No    Alcohol/week: 0.0 standard drinks   Drug use: No   Sexual activity: Not Currently    Birth control/protection:  Post-menopausal  Other Topics Concern   Not on file  Social History Narrative   Patient lives alone in [place].    Patient uses public transportation.    Patient enjoys reading and watching detective shows.    Social Determinants of Health   Financial Resource Strain: Low Risk    Difficulty of Paying Living Expenses: Not hard at all  Food Insecurity: No Food Insecurity   Worried About Charity fundraiser in the Last Year: Never true   Arboriculturist in the Last Year: Never true  Transportation Needs: Unmet Transportation Needs   Lack of Transportation (Medical): No   Lack of Transportation (Non-Medical): Yes  Physical Activity: Inactive   Days of Exercise per Week: 0 days   Minutes of Exercise per Session: 0 min  Stress: No Stress Concern Present   Feeling of Stress : Only a little  Social Connections: Socially Isolated   Frequency of Communication with Friends and Family: More than three times a week   Frequency of Social Gatherings with Friends and Family: More than three times a  week   Attends Religious Services: Never   Marine scientist or Organizations: No   Attends Music therapist: Never   Marital Status: Separated  Intimate Partner Violence: Not At Risk   Fear of Current or Ex-Partner: No   Emotionally Abused: No   Physically Abused: No   Sexually Abused: No    Outpatient Medications Prior to Visit  Medication Sig Dispense Refill   acetaminophen (TYLENOL) 500 MG tablet Take 1,000 mg by mouth every 8 (eight) hours as needed for moderate pain.     amLODipine (NORVASC) 10 MG tablet TAKE 1 TABLET(10 MG) BY MOUTH AT BEDTIME 90 tablet 3   atorvastatin (LIPITOR) 80 MG tablet TAKE 1 TABLET(80 MG) BY MOUTH DAILY 90 tablet 3   buPROPion (WELLBUTRIN XL) 300 MG 24 hr tablet Take 1 tablet (300 mg total) by mouth every morning. 90 tablet 0   cloNIDine (CATAPRES) 0.2 MG tablet Take 0.5 tablets (0.1 mg total) by mouth 2 (two) times daily. 60 tablet 3   clotrimazole (LOTRIMIN) 1 % cream Apply 1 application topically 2 (two) times daily. 60 g 1   ezetimibe (ZETIA) 10 MG tablet TAKE 1 TABLET(10 MG) BY MOUTH DAILY 90 tablet 3   fluticasone (FLONASE) 50 MCG/ACT nasal spray PLACE 2 SPRAYS DAILY INTO EACH NOSTRIL AS NEEDED 48 g 1   gabapentin (NEURONTIN) 100 MG capsule Take 1 capsule (100 mg total) by mouth at bedtime. 30 capsule 0   hydrochlorothiazide (HYDRODIURIL) 12.5 MG tablet TAKE 1 TABLET(12.5 MG) BY MOUTH DAILY 30 tablet 5   latanoprost (XALATAN) 0.005 % ophthalmic solution Place 1 drop into both eyes at bedtime.     losartan (COZAAR) 25 MG tablet TAKE 1 TABLET(25 MG) BY MOUTH DAILY 30 tablet 11   metFORMIN (GLUMETZA) 1000 MG (MOD) 24 hr tablet Take 1 tablet two times a day with meals 180 tablet 1   mirtazapine (REMERON) 30 MG tablet TAKE 1 TABLET(30 MG) BY MOUTH AT BEDTIME 90 tablet 0   propranolol (INDERAL) 40 MG tablet Take 1 tablet (40 mg total) by mouth 2 (two) times daily. 60 tablet 11   spironolactone (ALDACTONE) 25 MG tablet Take 1 tablet (25 mg  total) by mouth at bedtime. 90 tablet 3   LINZESS 290 MCG CAPS capsule TAKE 1 CAPSULE BY MOUTH DAILY BEFORE BREAKFAST 90 capsule 0   alendronate (FOSAMAX) 70 MG tablet Take  1 tablet (70 mg total) by mouth every 7 (seven) days. Take with a full glass of water on an empty stomach. (Patient not taking: No sig reported) 4 tablet 11   Blood Pressure Monitoring (BLOOD PRESSURE KIT) DEVI Please measure BP at same time each day 1 each 0   clobetasol ointment (TEMOVATE) 0.05 % APPLY TOPICALLY TO THE AFFECTED AREA TWICE DAILY (Patient not taking: No sig reported) 30 g 1   loratadine (CLARITIN REDITABS) 10 MG dissolvable tablet Take 1 tablet (10 mg total) by mouth daily. As needed for allergy symptoms (Patient not taking: No sig reported) 31 tablet 12   triamcinolone ointment (KENALOG) 0.5 % Apply 1 application topically 2 (two) times daily. Do not use for more than 1 week at a time. (Patient not taking: No sig reported) 60 g 3   mupirocin ointment (BACTROBAN) 2 % APPLY EXTERNALLY TO THE AFFECTED AREA THREE TIMES DAILY (Patient not taking: Reported on 08/29/2021)     No facility-administered medications prior to visit.    Allergies  Allergen Reactions   Aspirin Other (See Comments)    Dr told pt not to take asa d/t bleeding ulcer in 1985.   Penicillins Nausea Only    Tablets only.  Denies any airway involvement.     Latex Rash    Relieved with over the counter medications.    Review of Systems     Objective:    Physical Exam  BP 126/60   Pulse 60   Wt 159 lb (72.1 kg)   SpO2 98%   BMI 30.04 kg/m  Wt Readings from Last 3 Encounters:  08/29/21 159 lb (72.1 kg)  07/18/21 166 lb 9.6 oz (75.6 kg)  05/21/21 169 lb 3.2 oz (76.7 kg)    Health Maintenance Due  Topic Date Due   Zoster Vaccines- Shingrix (1 of 2) Never done   COLONOSCOPY (Pts 45-14yr Insurance coverage will need to be confirmed)  08/08/2019   FOOT EXAM  09/12/2020    There are no preventive care reminders to display for  this patient.   Lab Results  Component Value Date   TSH 0.880 03/26/2021   Lab Results  Component Value Date   WBC 6.7 07/18/2021   HGB 12.4 07/18/2021   HCT 37.7 07/18/2021   MCV 85 07/18/2021   PLT 219 07/18/2021   Lab Results  Component Value Date   NA 142 07/18/2021   K 4.9 07/18/2021   CO2 24 07/18/2021   GLUCOSE 183 (H) 07/18/2021   BUN 9 07/18/2021   CREATININE 1.01 (H) 07/18/2021   BILITOT 0.6 07/18/2021   ALKPHOS 101 07/18/2021   AST 16 07/18/2021   ALT 11 07/18/2021   PROT 6.5 07/18/2021   ALBUMIN 4.2 07/18/2021   CALCIUM 9.5 07/18/2021   EGFR 60 07/18/2021   Lab Results  Component Value Date   CHOL 188 01/25/2021   Lab Results  Component Value Date   HDL 38 (L) 01/25/2021   Lab Results  Component Value Date   LDLCALC 121 (H) 01/25/2021   Lab Results  Component Value Date   TRIG 161 (H) 01/25/2021   Lab Results  Component Value Date   CHOLHDL 4.9 (H) 01/25/2021   Lab Results  Component Value Date   HGBA1C 8.9 (A) 07/18/2021       Assessment & Plan:   Problem List Items Addressed This Visit       Medium    Memory change - Primary   Relevant Orders  AMB Referral to [facility]     Unprioritized   Constipation    Patient reports that she has longstanding history of constipation and has been placed on linzess in the past of 290 mcg. She reports that she cannot tolerate it as it causes intense sweating and cramping. Will decrease to linzess 145 mcg and reassess. She reports previous failure of fiber and miralax trials.      Relevant Medications   linaclotide (LINZESS) 145 MCG CAPS capsule   Snoring    Patient reports that she has been told of loud snoring, sometimes gasping. She also finds that she wakes up frequently at night, then feels excessively tired throughout the day. Recommend sleep study and if OSA found, titration of CPAP, referral placed.      Denture sore mouth   Other Visit Diagnoses     Loud snoring        Relevant Orders   Split night study        Meds ordered this encounter  Medications   linaclotide (LINZESS) 145 MCG CAPS capsule    Sig: Take 1 capsule (145 mcg total) by mouth daily before breakfast.    Dispense:  30 capsule    Refill:  11      , MD

## 2021-08-30 NOTE — Progress Notes (Signed)
I saw the patient with Dr Chauncey Reading. I agree with her assessment and plan documented in her geri consult note.    Mild Cognitive Impairment [name]'s MoCA score of 18 out of 30 with her preservation of iADLs & ADL functional abilities is consitent with Mild Cognitive Impairment.  We discussed that MCI can progress to dementia at 3-5% annually.  Monitoring of the patient's iADLs and ADLs is more effective method of monitoring them for a transition to dementia.    We discussed lifestyle changes associated with slowing of memory decline, including mediterranean diet, exercise (including walking) and socializing.    Recommended patient address advanced planning for patient's financial estate, future legal questions of competency and desired medical care the patient desires currently for resuscitation and for future care, including health care power of attorney agent, should patient become severely incapacitated.  Patient and caregiver(s) questions were invited and answered as best as possible.  50 minutes face to face where spent in total with counseling / coordination of care took more than 50% of the total time. Counseling involved discussion of the cognitive test and function test results with patient. Discussion on diagnosis and natural history of dementia.

## 2021-09-03 ENCOUNTER — Ambulatory Visit: Payer: 59 | Admitting: Licensed Clinical Social Worker

## 2021-09-03 ENCOUNTER — Telehealth: Payer: Self-pay | Admitting: Licensed Clinical Social Worker

## 2021-09-03 NOTE — Patient Instructions (Signed)
   It was a pleasure speaking with you today. I am sorry you were unable to keep your phone appointment today.   per your request your appointment is scheduled 09/10/2021  Casimer Lanius, [place] [facility]  [phone]

## 2021-09-03 NOTE — Chronic Care Management (AMB) (Addendum)
    Clinical Social Work  Care Management   Phone Outreach    09/03/2021 Name: [name] MRN: [id number] DOB: [birth year]  [name] is a 70 y.o. year old female who is a primary care patient of Carollee Leitz, MD .   Reason for referral: Advanced Directive Education.    CCM LCSW reached out to patient today by phone to introduce self, assess needs and offer Care Management services and interventions.     Plan:CCM LCSW will wait for return call. If no return call is received, Will route chart to Care Guide to see if patient would like to reschedule phone appointment   Review of patient status, including review of consultants reports, relevant laboratory and other test results, and collaboration with appropriate care team members and the patient's provider was performed as part of comprehensive patient evaluation and provision of care management services.    Casimer Lanius, [place] / [place]   [phone]

## 2021-09-03 NOTE — Chronic Care Management (AMB) (Signed)
    Clinical Social Work  Care Management   Phone Outreach    09/03/2021 Name: JASNOOR TRUSSELL MRN: [id number] DOB: Aug 21, 1951  Sherren Kerns Curd is a 70 y.o. year old female who is a primary care patient of Carollee Leitz, MD .   Reason for referral: Advanced Directive Education.    Patient returned phone call, she is unable to keep phone appointment today and requested to reschedule.  Plan:Appointment was rescheduled with CCM LCSW 09/10/2021  Review of patient status, including review of consultants reports, relevant laboratory and other test results, and collaboration with appropriate care team members and the patient's provider was performed as part of comprehensive patient evaluation and provision of care management services.     Casimer Lanius, [place] / [place]   [phone]

## 2021-09-10 ENCOUNTER — Telehealth: Payer: 59

## 2021-09-10 ENCOUNTER — Telehealth (HOSPITAL_BASED_OUTPATIENT_CLINIC_OR_DEPARTMENT_OTHER): Payer: 59 | Admitting: Psychiatry

## 2021-09-10 ENCOUNTER — Encounter (HOSPITAL_COMMUNITY): Payer: Self-pay | Admitting: Psychiatry

## 2021-09-10 ENCOUNTER — Other Ambulatory Visit: Payer: Self-pay

## 2021-09-10 DIAGNOSIS — F330 Major depressive disorder, recurrent, mild: Secondary | ICD-10-CM | POA: Diagnosis not present

## 2021-09-10 DIAGNOSIS — F419 Anxiety disorder, unspecified: Secondary | ICD-10-CM | POA: Diagnosis not present

## 2021-09-10 MED ORDER — BUPROPION HCL ER (XL) 300 MG PO TB24: 300.0000 mg | ORAL_TABLET | ORAL | 0 refills | Status: DC

## 2021-09-10 MED ORDER — MIRTAZAPINE 30 MG PO TABS: ORAL_TABLET | ORAL | 0 refills | Status: DC

## 2021-09-10 NOTE — Progress Notes (Signed)
Virtual Visit via Telephone Note  I connected with [name] on 09/10/21 at  3:20 PM EST by telephone and verified that I am speaking with the correct person using two identifiers.  Location: Patient: Home Provider: Home Office   I discussed the limitations, risks, security and privacy concerns of performing an evaluation and management service by telephone and the availability of in person appointments. I also discussed with the patient that there may be a patient responsible charge related to this service. The patient expressed understanding and agreed to proceed.   History of Present Illness: Patient is evaluated by phone session.  She is not taking Depakote which was discontinued on the last visit.  Her Depakote level was persistently low and less than 4 and she was not sure if she is taking the medication regularly.  Since she stopped the Depakote she had lost few pounds and she is more active but is still have chronic health issues and not happy with her current PCP.  Patient told her doctor has changed the medication multiple times but she still have to get up in the middle of the night to go to the bathroom.  She also concerned about her memory and she is hoping to get a memory evaluation.  She denies any crying spells or any feeling of hopelessness but admitted her attention concentration is not as good.  Patient has diabetes and other chronic conditions.  She admitted anxiety but denies any worsening of symptoms.  She denies any crying spells or any suicidal thoughts.  She had a good support from her daughter.  Her plan is to spend Thanksgiving with her daughter.  Patient does not drive.  She like to keep the Wellbutrin and mirtazapine which is helping her depression and anxiety.   Past Psychiatric History: Reviewed. H/O inpatient treatment at [facility] to detox from Xanax.  Saw Psychiatrist in [place], [place] and [facility].  Tried Paxil,  Prozac, vistaril and Zoloft.  No h/o suicidal attempt or any psychosis.  Psychiatric Specialty Exam: Physical Exam  Review of Systems  Weight 159 lb (72.1 kg).There is no height or weight on file to calculate BMI.  General Appearance: NA  Eye Contact:  NA  Speech:  Slow  Volume:  Decreased  Mood:  Irritable  Affect:  NA  Thought Process:  Goal Directed  Orientation:  Full (Time, Place, and Person)  Thought Content:  WDL  Suicidal Thoughts:  No  Homicidal Thoughts:  No  Memory:  Immediate;   Good Recent;   Fair Remote;   Fair  Judgement:  Intact  Insight:  Present  Psychomotor Activity:  NA  Concentration:  Concentration: Fair and Attention Span: Fair  Recall:  [employer] of Knowledge:  Fair  Language:  Good  Akathisia:  No  Handed:  Right  AIMS (if indicated):     Assets:  Communication Skills Desire for Improvement Housing Social Support  ADL's:  Intact  Cognition:  WNL  Sleep:   fair      Assessment and Plan: Depressive disorder, recurrent.  Anxiety.  I reviewed blood work results.  Her creatinine is 1.01 and BUN 9 but patient told her last hemoglobin A1c was almost 9 which was never before and she is not happy.  She is thinking to switch her PCP but so far she has not had any luck from providers who takes the new patient.  She like to have a neurology evaluation as she  noticed her memory is declining.  We will refer neurology for memory evaluation.  Continue Wellbutrin XL 300 mg daily and mirtazapine 30 mg at bedtime.  Patient like to have her prescription sent to [facility] [address].  Recommended to call us back if she has any question or any concern.  Follow-up in 3 months.  Follow Up Instructions:    I discussed the assessment and treatment plan with the patient. The patient was provided an opportunity to ask questions and all were answered. The patient agreed with the plan and demonstrated an understanding of the instructions.    The patient was advised to call back or seek an in-person evaluation if the symptoms worsen or if the condition fails to improve as anticipated.  I provided 15 minutes of non-face-to-face time during this encounter.   Kathlee Nations, MD

## 2021-09-16 ENCOUNTER — Ambulatory Visit: Payer: 59 | Admitting: Licensed Clinical Social Worker

## 2021-09-16 DIAGNOSIS — Z7189 Other specified counseling: Secondary | ICD-10-CM

## 2021-09-16 NOTE — Patient Instructions (Signed)
Licensed Clinical Social Worker Visit Information  Goals we discussed today:   Goals Addressed             This Visit's Progress    Effective Long-Term Care Planning       Patient Goals/Self-Care Activities : Over the next 30 days Review EMMI educational information on Advance Directive  Complete Advance Directive packet,  Have advance directive notarized and provide a copy to provider office Call LCSW if you have questions            Materials provided: Yes: see above  [name] was given information about Care Management services today including:  Care Management services include personalized support from designated clinical staff supervised by her physician, including individualized plan of care and coordination with other care providers 24/7 contact phone numbers for assistance for urgent and routine care needs. The patient may stop Care Management services at any time by phone call to the office staff.  It was a pleasure speaking with you today. No follow up scheduled, per our conversation you will call the office if needed Per our conversation I will remain part of your care team for the next 30 days.  If no needs are identified in the next 30 days, I will disconnect from the care team.  [name], [place] [facility]  [phone]

## 2021-09-16 NOTE — Chronic Care Management (AMB) (Signed)
Care Management  Clinical Social Work Note  09/16/2021 Name: [name] MRN: [id number] DOB: [birth year]  [name] is a 70 y.o. year old female who is a primary care patient of Carollee Leitz, MD. The CCM team was consulted for assistance with care coordination needs: Advanced Directive Education   Ms. Coccia was given information about Care Management services today including:  Care Management services include personalized support from designated clinical staff supervised by her physician, including individualized plan of care and coordination with other care providers 24/7 contact phone numbers for assistance for urgent and routine care needs. The patient may stop care management services at any time (effective at the end of the month) by phone call to the office staff.  Patient agreed to services and consent obtained.   Engaged with patient by telephone for initial visit in response to provider referral for social care coordination services.   Assessment: Patient does not have an advance directive, she will review information provided and contact [name] if she has questions . See Care Plan below for interventions and patient self-care actives.  Follow up Plan:  Patient does not require or desire continued follow-up by CCM [name]. Will contact the office if needed Patient may benefit from and is in agreement for CCM [name] to remain part of care team for the next 30 days.  If no needs are identified in the next 30 days, CCM LSCW will disconnect from the care team.   Review of patient past medical history, allergies, medications, and health status, including review of pertinent consultant reports was performed as part of comprehensive evaluation and provision of care management/care coordination services.   SDOH (Social Determinants of Health) screening and interventions performed today:  SDOH Interventions    Flowsheet Row Most Recent Value  SDOH Interventions    Transportation Interventions Other (Comment)  [message to careguide to f/u on resources provided]       Advanced Directives Status:See Care Plan for related entries     Care Plan    Conditions to be addressed/monitored per PCP order:   Care Plan : General Social Work (Adult)  Updates made by Emily Cane, [name] since 09/16/2021 12:00 AM     Problem: No Advance Directive      Goal: Effective Long-Term Care Planning   Start Date: 09/16/2021  This Visit's Progress: On track  Priority: Medium  Note:   Current barriers:   Patient does not have an advance directive.  Needs education, support and coordination in order to meet this need. Clinical Goal(s): Over the next 30 days, the patient will review information on advance directive, complete advance directive packet and have notarized.  Interventions: Assessed understanding of Advance Directives. A voluntary discussion about advanced care planning including importance of advanced directives, healthcare proxy and living will was discussed with the patient.  EMMI educational information on Advance Directives provided; Advance Directive and Choosing A Healthcare Proxy patient has an advance directive packet   Inter-disciplinary care team collaboration (see longitudinal plan of care) Patient Goals/Self-Care Activities : Over the next 30 days Review educational information on Advance Directive  Complete Advance Directive packet,  Have advance directive notarized and provide a copy to provider office Call [name] if you have questions     [name], [place] / [place]   [phone]

## 2021-09-25 ENCOUNTER — Other Ambulatory Visit: Payer: Self-pay | Admitting: Family Medicine

## 2021-09-27 ENCOUNTER — Telehealth: Payer: Self-pay

## 2021-09-27 NOTE — Telephone Encounter (Signed)
Patient's insurance case manager calls nurse line requesting DME rolling walker for patient.   Please route back to RN team once this order has been placed.   Talbot Grumbling, RN

## 2021-09-30 ENCOUNTER — Other Ambulatory Visit: Payer: Self-pay | Admitting: Family Medicine

## 2021-09-30 DIAGNOSIS — J449 Chronic obstructive pulmonary disease, unspecified: Secondary | ICD-10-CM

## 2021-09-30 NOTE — Telephone Encounter (Signed)
Community message sent to [employer]. Will await response.   Anaia Frith C Jaydence Arnesen, RN  

## 2021-09-30 NOTE — Telephone Encounter (Signed)
See below message from [employer].   Got it!   Talbot Grumbling, RN

## 2021-09-30 NOTE — Progress Notes (Signed)
er

## 2021-09-30 NOTE — Telephone Encounter (Signed)
Order placed

## 2021-10-07 ENCOUNTER — Other Ambulatory Visit: Payer: Self-pay | Admitting: Family Medicine

## 2021-10-09 ENCOUNTER — Other Ambulatory Visit: Payer: Self-pay | Admitting: Family Medicine

## 2021-10-15 ENCOUNTER — Other Ambulatory Visit: Payer: 59

## 2021-11-08 ENCOUNTER — Other Ambulatory Visit (HOSPITAL_COMMUNITY): Payer: Self-pay | Admitting: Psychiatry

## 2021-11-08 ENCOUNTER — Other Ambulatory Visit: Payer: Self-pay | Admitting: Family Medicine

## 2021-11-08 DIAGNOSIS — F419 Anxiety disorder, unspecified: Secondary | ICD-10-CM

## 2021-11-08 DIAGNOSIS — F330 Major depressive disorder, recurrent, mild: Secondary | ICD-10-CM

## 2021-11-14 ENCOUNTER — Ambulatory Visit: Payer: 59 | Admitting: Sports Medicine

## 2021-11-20 ENCOUNTER — Encounter: Payer: Self-pay | Admitting: Family Medicine

## 2021-11-20 ENCOUNTER — Other Ambulatory Visit: Payer: Self-pay | Admitting: Family Medicine

## 2021-11-20 ENCOUNTER — Ambulatory Visit (INDEPENDENT_AMBULATORY_CARE_PROVIDER_SITE_OTHER): Payer: 59 | Admitting: Family Medicine

## 2021-11-20 ENCOUNTER — Other Ambulatory Visit: Payer: Self-pay

## 2021-11-20 VITALS — BP 119/60 | HR 57 | Ht 61.0 in | Wt 161.2 lb

## 2021-11-20 DIAGNOSIS — R0609 Other forms of dyspnea: Secondary | ICD-10-CM

## 2021-11-20 DIAGNOSIS — J449 Chronic obstructive pulmonary disease, unspecified: Secondary | ICD-10-CM

## 2021-11-20 DIAGNOSIS — E11649 Type 2 diabetes mellitus with hypoglycemia without coma: Secondary | ICD-10-CM | POA: Diagnosis not present

## 2021-11-20 DIAGNOSIS — E1169 Type 2 diabetes mellitus with other specified complication: Secondary | ICD-10-CM | POA: Diagnosis not present

## 2021-11-20 DIAGNOSIS — E785 Hyperlipidemia, unspecified: Secondary | ICD-10-CM

## 2021-11-20 DIAGNOSIS — E1165 Type 2 diabetes mellitus with hyperglycemia: Secondary | ICD-10-CM

## 2021-11-20 DIAGNOSIS — I152 Hypertension secondary to endocrine disorders: Secondary | ICD-10-CM

## 2021-11-20 DIAGNOSIS — E1159 Type 2 diabetes mellitus with other circulatory complications: Secondary | ICD-10-CM

## 2021-11-20 DIAGNOSIS — R0602 Shortness of breath: Secondary | ICD-10-CM

## 2021-11-20 DIAGNOSIS — E119 Type 2 diabetes mellitus without complications: Secondary | ICD-10-CM | POA: Diagnosis not present

## 2021-11-20 DIAGNOSIS — E559 Vitamin D deficiency, unspecified: Secondary | ICD-10-CM

## 2021-11-20 LAB — POCT GLYCOSYLATED HEMOGLOBIN (HGB A1C): HbA1c, POC (controlled diabetic range): 10.6 % — AB (ref 0.0–7.0)

## 2021-11-20 MED ORDER — EMPAGLIFLOZIN 25 MG PO TABS: 25.0000 mg | ORAL_TABLET | Freq: Every day | ORAL | 1 refills | Status: DC

## 2021-11-20 MED ORDER — CLONIDINE HCL 0.2 MG PO TABS: 0.1000 mg | ORAL_TABLET | Freq: Every day | ORAL | 1 refills | Status: DC

## 2021-11-20 NOTE — Patient Instructions (Signed)
Thank you for coming to see me today. It was a pleasure. Today we talked about:   You should pay attention to your hemoglobin A1C.  It is a three month test about your average blood sugar. If the A1C is - <7.0 is great.  That is our goal for treating you. - Between 7.0 and 9.0 is not so good.  We would need to work to do better. - Above 9.0 is terrible.  You would really need to work with [place] to get it under control.    Today's A1C = 10.6   Take Metformin 1000 mg 1 tablet 2 times a day Start Jardiance 25 mg daily  Take clonidine 0.5 mg at night  Please return tomorrow for lab work  Schedule appointment with Dr. Valentina Lucks for lung testing  Please follow-up with PCP in 1 week  If you have any questions or concerns, please do not hesitate to call the office at [phone].  Best,   Carollee Leitz, MD

## 2021-11-20 NOTE — Progress Notes (Addendum)
° ° °  SUBJECTIVE:   CHIEF COMPLAINT / HPI: Diabetes check  Has not been feeling well for past couple weeks.  Reports fatigue, polyuria, polydipsia.  Increased shortness of breath for the past 2 weeks both with sitting and with exertion.  Denies any chest pain, cough, fevers, nausea/vomiting, abdominal pain, diarrhea or constipation.  Denies any weight gain or lower extremity edema.  Reports compliancy with metformin 1 g twice daily.  Endorses diet has not been good.  Shortness of breath Reports increasing shortness of breath past 2 weeks with sitting and exertion.  Has had similar symptoms in the past.  Denies any fever, cough, chest pain or lower extremity edema.  Reports history of smoking has quit years ago.    PERTINENT  PMH / PSH:  Diabetes type 2 Hypertension HLD  OBJECTIVE:   BP 119/60    Pulse (!) 57    Ht $R'5\' 1"'rl$  (1.549 m)    Wt 161 lb 3.2 oz (73.1 kg)    SpO2 99%    BMI 30.46 kg/m    General: Alert, no acute distress Cardio: Normal S1 and S2, RRR, no r/m/g Pulm: CTAB, normal work of breathing, no wheezing, rhonchi, Abdomen: Bowel sounds normal. Abdomen soft and non-tender.  Extremities: No peripheral edema.   ASSESSMENT/PLAN:   Type 2 diabetes mellitus with hyperglycemia (HCC) A1C is 10.6 today, increased from last visit of 8.9.  Patient reports compliance with medication.  Currently symptomatic with fatigue, polyuria polydipsia. -Continue metformin 1 g twice daily -Add Jardiance 25 mg daily -B-met today -Repeat B-met in 1 week -Strict return precautions provided -Follow-up PCP 1 week.  Hyperlipidemia associated with type 2 diabetes mellitus (HCC) Last LDL 121, goal <70.  Reports compliancy with statin and Zetia -Repeat lipid today, if remains at elevated consider adding Vascepa. -Follow-up with results  COPD (chronic obstructive pulmonary disease) (HCC) Shortness of breath with rest and exertion.  Prior smoking history.  Previous PFTs 2015 normal lung function.   Recommended to have repeat PFTs once smoking cessation.  Low suspicion for CHF given clear lung sounds no lower extremity edema.  Recent echo was LVEF 60-65% with normal LV function.  Do not suspect PE given no tachycardia, hypoxia, chest pain or extremity edema and Wells score 0. -Recommend follow-up with Dr. Valentina Lucks for repeat PFTs -Strict return precautions provided  Hypertension associated with diabetes ([place]) Well-controlled. Medications include amlodipine 10 mg daily, clonidine 0.1 mg twice daily, hydrochlorothiazide 12.5 mg daily, Cozaar 25 mg daily and spironolactone 25 mg daily -Decrease Clonidine to 0.1 mg nightly, plan to wean to off in future.   -Patient to have home nurse check BP at home  -Strict return precautions provided -Follow up in 1 weeks  Vitamin D deficiency Dexa scan scheduled Patient has been taking Fosamax as previously prescribed  -Continue Fosamax weekly -Repeat Vit D levels -Follow up with Dexa results -Follow up with results     Carollee Leitz, MD [place]

## 2021-11-22 DIAGNOSIS — E1165 Type 2 diabetes mellitus with hyperglycemia: Secondary | ICD-10-CM | POA: Insufficient documentation

## 2021-11-22 HISTORY — DX: Type 2 diabetes mellitus with hyperglycemia: E11.65

## 2021-11-22 NOTE — Assessment & Plan Note (Addendum)
Well-controlled. Medications include amlodipine 10 mg daily, clonidine 0.1 mg twice daily, hydrochlorothiazide 12.5 mg daily, Cozaar 25 mg daily and spironolactone 25 mg daily -Decrease Clonidine to 0.1 mg nightly, plan to wean to off in future.   -Patient to have home nurse check BP at home  -Strict return precautions provided -Follow up in 1 weeks

## 2021-11-22 NOTE — Assessment & Plan Note (Signed)
A1C is 10.6 today, increased from last visit of 8.9.  Patient reports compliance with medication.  Currently symptomatic with fatigue, polyuria polydipsia. -Continue metformin 1 g twice daily -Add Jardiance 25 mg daily -B-met today -Repeat B-met in 1 week -Strict return precautions provided -Follow-up PCP 1 week.

## 2021-11-22 NOTE — Assessment & Plan Note (Addendum)
Shortness of breath with rest and exertion.  Prior smoking history.  Previous PFTs 2015 normal lung function.  Recommended to have repeat PFTs once smoking cessation.  Low suspicion for CHF given clear lung sounds no lower extremity edema.  Recent echo was LVEF 60-65% with normal LV function.  Do not suspect PE given no tachycardia, hypoxia, chest pain or extremity edema and Wells score 0. -Recommend follow-up with Dr. Valentina Lucks for repeat PFTs -Strict return precautions provided

## 2021-11-22 NOTE — Assessment & Plan Note (Signed)
Last LDL 121, goal <70.  Reports compliancy with statin and Zetia -Repeat lipid today, if remains at elevated consider adding Vascepa. -Follow-up with results

## 2021-11-23 ENCOUNTER — Encounter: Payer: Self-pay | Admitting: Family Medicine

## 2021-11-23 MED ORDER — CLONIDINE HCL 0.1 MG PO TABS: 0.1000 mg | ORAL_TABLET | Freq: Every day | ORAL | 0 refills | Status: DC

## 2021-11-23 NOTE — Assessment & Plan Note (Signed)
Dexa scan scheduled Patient has been taking Fosamax as previously prescribed  -Continue Fosamax weekly -Repeat Vit D levels -Follow up with Dexa results -Follow up with results

## 2021-11-25 ENCOUNTER — Other Ambulatory Visit: Payer: Self-pay | Admitting: Family Medicine

## 2021-12-02 ENCOUNTER — Ambulatory Visit: Payer: 59 | Admitting: Family Medicine

## 2021-12-02 NOTE — Telephone Encounter (Signed)
Received follow up message from [employer]. They are currently looking into this order.   Talbot Grumbling, RN

## 2021-12-02 NOTE — Patient Instructions (Incomplete)
Thank you for coming to see me today. It was a pleasure.   We will get some labs today.  If they are abnormal or we need to do something about them, I will call you.  If they are normal, I will send you a message on MyChart (if it is active) or a letter in the mail.  If you don't hear from [place] in 2 weeks, please call the office at the number below.   Return for lab work in 1 week  Please follow-up with PCP in 1 month  If you have any questions or concerns, please do not hesitate to call the office at [phone].  Best,   Carollee Leitz, MD

## 2021-12-02 NOTE — Telephone Encounter (Signed)
Received message from provider that patient has not yet received rollator.   Community message sent to [employer] to check status.   Talbot Grumbling, RN

## 2021-12-02 NOTE — Progress Notes (Deleted)
    SUBJECTIVE:   CHIEF COMPLAINT / HPI:   ***  PERTINENT  PMH / PSH: ***  OBJECTIVE:   There were no vitals taken for this visit.   General: Alert, no acute distress Cardio: Normal S1 and S2, RRR, no r/m/g Pulm: CTAB, normal work of breathing Abdomen: Bowel sounds normal. Abdomen soft and non-tender.  Extremities: No peripheral edema.  Neuro: Cranial nerves grossly intact   ASSESSMENT/PLAN:   No problem-specific Assessment & Plan notes found for this encounter.     Wanona Stare, MD [facility]   

## 2021-12-05 ENCOUNTER — Encounter: Payer: Self-pay | Admitting: Sports Medicine

## 2021-12-05 ENCOUNTER — Other Ambulatory Visit: Payer: Self-pay

## 2021-12-05 ENCOUNTER — Ambulatory Visit (INDEPENDENT_AMBULATORY_CARE_PROVIDER_SITE_OTHER): Payer: 59 | Admitting: Sports Medicine

## 2021-12-05 DIAGNOSIS — M2041 Other hammer toe(s) (acquired), right foot: Secondary | ICD-10-CM

## 2021-12-05 DIAGNOSIS — M79675 Pain in left toe(s): Secondary | ICD-10-CM | POA: Diagnosis not present

## 2021-12-05 DIAGNOSIS — M2141 Flat foot [pes planus] (acquired), right foot: Secondary | ICD-10-CM

## 2021-12-05 DIAGNOSIS — M2142 Flat foot [pes planus] (acquired), left foot: Secondary | ICD-10-CM

## 2021-12-05 DIAGNOSIS — Z978 Presence of other specified devices: Secondary | ICD-10-CM

## 2021-12-05 DIAGNOSIS — M2042 Other hammer toe(s) (acquired), left foot: Secondary | ICD-10-CM

## 2021-12-05 DIAGNOSIS — M79674 Pain in right toe(s): Secondary | ICD-10-CM

## 2021-12-05 DIAGNOSIS — E119 Type 2 diabetes mellitus without complications: Secondary | ICD-10-CM

## 2021-12-05 DIAGNOSIS — B351 Tinea unguium: Secondary | ICD-10-CM

## 2021-12-05 NOTE — Progress Notes (Signed)
Subjective: [name] is a 71 y.o. female patient with history of diabetes who presents to office today complaining of long,mildly painful nails and callus to heels/right while ambulating in shoes; unable to trim. Patient she is doing okay still has some pain in the right ankle especially at the heel area at area of orthopedic hardware but it is not unmanageable states that she saw Dr. Posey Pronto and was told to come back whenever she wants to consider having the hardware removed.  Patient is diabetic last blood sugar not recorded last A1c 10 PCP visit 2 weeks ago,Walsh, Tanya, MD.  No other pedal complaints noted.  Patient Active Problem List   Diagnosis Date Noted   Type 2 diabetes mellitus with hyperglycemia ([place]) 11/22/2021   Constipation 08/29/2021   Snoring 08/29/2021   Denture sore mouth 08/29/2021   Mild cognitive impairment 07/18/2021   Dyspnea on exertion 05/22/2021   Bilateral lower extremity edema 01/26/2021   Mixed urge and stress incontinence 09/07/2018   Well controlled type 2 diabetes mellitus ([place]) 02/24/2018   Allergic rhinitis 04/25/2014   Skin lesions 12/08/2013   Severe episode of recurrent major depressive disorder ([place]) 09/08/2012   Vitamin D deficiency 06/13/2012   Sleep difficulties 06/13/2012   Hyperlipidemia associated with type 2 diabetes mellitus ([place]) 05/17/2012   Migraine 01/20/2012   COPD (chronic obstructive pulmonary disease) ([place]) 03/05/2011   BACK PAIN WITH RADICULOPATHY 08/17/2008   Hypertension associated with diabetes ([place]) 11/05/2007   GERD 11/05/2007   Current Outpatient Medications on File Prior to Visit  Medication Sig Dispense Refill   acetaminophen (TYLENOL) 500 MG tablet Take 1,000 mg by mouth every 8 (eight) hours as needed for moderate pain.     alendronate (FOSAMAX) 70 MG tablet Take 1 tablet (70 mg total) by mouth every 7 (seven) days. Take with a full glass of water on an empty stomach. (Patient not taking: No sig reported) 4  tablet 11   amLODipine (NORVASC) 10 MG tablet TAKE 1 TABLET(10 MG) BY MOUTH AT BEDTIME 90 tablet 3   atorvastatin (LIPITOR) 80 MG tablet TAKE 1 TABLET(80 MG) BY MOUTH DAILY 90 tablet 3   Blood Pressure Monitoring (BLOOD PRESSURE KIT) DEVI Please measure BP at same time each day 1 each 0   buPROPion (WELLBUTRIN XL) 300 MG 24 hr tablet Take 1 tablet (300 mg total) by mouth every morning. 90 tablet 0   clobetasol ointment (TEMOVATE) 0.05 % APPLY TOPICALLY TO THE AFFECTED AREA TWICE DAILY (Patient not taking: No sig reported) 30 g 1   cloNIDine (CATAPRES) 0.1 MG tablet Take 1 tablet (0.1 mg total) by mouth at bedtime. 30 tablet 0   clotrimazole (LOTRIMIN) 1 % cream Apply 1 application topically 2 (two) times daily. 60 g 1   empagliflozin (JARDIANCE) 25 MG TABS tablet Take 1 tablet (25 mg total) by mouth daily. 90 tablet 1   ezetimibe (ZETIA) 10 MG tablet TAKE 1 TABLET(10 MG) BY MOUTH DAILY 90 tablet 3   fluticasone (FLONASE) 50 MCG/ACT nasal spray PLACE 2 SPRAYS DAILY INTO EACH NOSTRIL AS NEEDED 48 g 1   gabapentin (NEURONTIN) 100 MG capsule TAKE ONE CAPSULE BY MOUTH at bedtime 30 capsule 0   hydrochlorothiazide (HYDRODIURIL) 12.5 MG tablet TAKE 1 TABLET(12.5 MG) BY MOUTH DAILY 30 tablet 5   latanoprost (XALATAN) 0.005 % ophthalmic solution Place 1 drop into both eyes at bedtime.     linaclotide (LINZESS) 145 MCG CAPS capsule Take 1 capsule (145 mcg total) by mouth daily before  breakfast. 30 capsule 11   loratadine (CLARITIN REDITABS) 10 MG dissolvable tablet Take 1 tablet (10 mg total) by mouth daily. As needed for allergy symptoms (Patient not taking: No sig reported) 31 tablet 12   losartan (COZAAR) 25 MG tablet TAKE 1 TABLET(25 MG) BY MOUTH DAILY 30 tablet 11   metFORMIN (GLUMETZA) 1000 MG (MOD) 24 hr tablet Take 1 tablet two times a day with meals 180 tablet 1   mirtazapine (REMERON) 30 MG tablet TAKE 1 TABLET(30 MG) BY MOUTH AT BEDTIME 90 tablet 0   propranolol (INDERAL) 40 MG tablet TAKE 1  Tablet BY MOUTH TWICE DAILY 212 tablet 0   spironolactone (ALDACTONE) 25 MG tablet Take 1 tablet (25 mg total) by mouth at bedtime. 90 tablet 3   triamcinolone ointment (KENALOG) 0.5 % Apply 1 application topically 2 (two) times daily. Do not use for more than 1 week at a time. (Patient not taking: No sig reported) 60 g 3   No current facility-administered medications on file prior to visit.   Allergies  Allergen Reactions   Aspirin Other (See Comments)    Dr told pt not to take asa d/t bleeding ulcer in 1985.   Penicillins Nausea Only    Tablets only.  Denies any airway involvement.     Latex Rash    Relieved with over the counter medications.    Recent Results (from the past 2160 hour(s))  HgB A1c     Status: Abnormal   Collection Time: 11/20/21  4:13 PM  Result Value Ref Range   Hemoglobin A1C     HbA1c POC (<> result, manual entry)     HbA1c, POC (prediabetic range)     HbA1c, POC (controlled diabetic range) 10.6 (A) 0.0 - 7.0 %    Objective: General: Patient is awake, alert, and oriented x 3 and in no acute distress.  Integument: Skin is warm, dry and supple bilateral. Nails are tender, long, thickened and dystrophic with subungual debris, consistent with onychomycosis, 1-5 bilateral. No signs of infection. At right heel there is a keratotic lesion with nucleated core consistent with porokeratosis very minimal in nature at this time, no signs of infection, Melanin spots plantar feet which are normal, Remaining integument unremarkable.  Vasculature:  Dorsalis Pedis pulse 1/4 bilateral. Posterior Tibial pulse  1/4 bilateral. Capillary fill time <3 sec 1-5 bilateral. Positive hair growth to the level of the digits. Temperature gradient within normal limits. No varicosities present bilateral. No edema present bilateral.   Neurology: The patient has intact sensation measured with a 5.07/10g Semmes Weinstein Monofilament at all pedal sites bilateral . Vibratory sensation intact  bilateral with tuning fork. No Babinski sign present bilateral.   Musculoskeletal: Pes planus foot type.  Minimal hammertoe deformity.  No current pain at right ankle or heel, muscular strength 5/5 in all lower extremity muscular groups bilateral without pain on range of motion. No tenderness with calf compression bilateral.  Assessment and Plan: Problem List Items Addressed This Visit   None Visit Diagnoses     Pain due to onychomycosis of toenails of both feet    -  Primary   Diabetes mellitus without complication (HCC)       Orthopedic hardware present       Pes planus of both feet       Hammer toes of both feet          -Examined patient. -Re-Discussed and educated patient on diabetic foot care, especially with  regards to the  vascular, neurological and musculoskeletal systems.  -Mechanically debrided all nails 1-5 bilateral using sterile nail nipper and filed with dremel without incident  -Keratosis minimal at right heel today no need for debridement -Continue with daily skin emollients -Continue with good supportive shoes that do not rub heel -Advised patient that she can consider orthopedic hardware but was discussed later with Dr. Posey Pronto when she has made up her mind however made patient aware that she must continue to work at getting her A1c under control as well prior to any surgery or procedure -Patient to be measured for diet medications for the year with Aaron Edelman -Answered all patient questions -Patient to return  in 3 months for at risk foot care -Patient advised to call the office if any problems or questions arise in the meantime.  Landis Martins, DPM

## 2021-12-10 ENCOUNTER — Encounter (HOSPITAL_COMMUNITY): Payer: Self-pay | Admitting: Psychiatry

## 2021-12-10 ENCOUNTER — Telehealth (HOSPITAL_BASED_OUTPATIENT_CLINIC_OR_DEPARTMENT_OTHER): Payer: 59 | Admitting: Psychiatry

## 2021-12-10 ENCOUNTER — Other Ambulatory Visit: Payer: Self-pay

## 2021-12-10 DIAGNOSIS — F330 Major depressive disorder, recurrent, mild: Secondary | ICD-10-CM | POA: Diagnosis not present

## 2021-12-10 DIAGNOSIS — F419 Anxiety disorder, unspecified: Secondary | ICD-10-CM

## 2021-12-10 MED ORDER — BUPROPION HCL ER (XL) 300 MG PO TB24: 300.0000 mg | ORAL_TABLET | ORAL | 0 refills | Status: DC

## 2021-12-10 MED ORDER — MIRTAZAPINE 30 MG PO TABS: ORAL_TABLET | ORAL | 0 refills | Status: DC

## 2021-12-10 NOTE — Progress Notes (Signed)
Virtual Visit via Telephone Note  I connected with [name] on 12/10/21 at  3:40 PM EST by telephone and verified that I am speaking with the correct person using two identifiers.  Location: Patient: Home Provider: Office   I discussed the limitations, risks, security and privacy concerns of performing an evaluation and management service by telephone and the availability of in person appointments. I also discussed with the patient that there may be a patient responsible charge related to this service. The patient expressed understanding and agreed to proceed.   History of Present Illness: Patient is evaluated by phone session.  She had a good Christmas and holidays.  She spent time with her daughter.  She continues to endorse anxiety and nervousness about her physical health.  Recently she had a blood work and her hemoglobin A1c jumped again.  Now she is 10.6.  She is not happy with PCP who is managing the diabetes.  She feels that she should be referred to endocrinologist.  Her PCP added Jardiance.  She denies any crying spells or any feeling of hopelessness.  She sleeps okay.  She is trying to watch her diet and now checking on the Internet about her food intake that helps her diabetes.  She has no tremors or shakes or any EPS.  She denies any suicidal thoughts.  She denies any major panic attack.  She like to keep the Wellbutrin and mirtazapine.  She also like to have message to her PCP so she can had a better management of diabetes.    Past Psychiatric History: Reviewed. H/O inpatient treatment at [facility] to detox from Xanax.  Saw Psychiatrist in [place], [place] and [facility].  Tried Paxil, Prozac, vistaril and Zoloft.  No h/o suicidal attempt or any psychosis.  Recent Results (from the past 2160 hour(s))  HgB A1c     Status: Abnormal   Collection Time: 11/20/21  4:13 PM  Result Value Ref Range   Hemoglobin A1C     HbA1c POC (<> result,  manual entry)     HbA1c, POC (prediabetic range)     HbA1c, POC (controlled diabetic range) 10.6 (A) 0.0 - 7.0 %     Psychiatric Specialty Exam: Physical Exam  Review of Systems  Weight 161 lb (73 kg).There is no height or weight on file to calculate BMI.  General Appearance: NA  Eye Contact:  NA  Speech:  Clear and Coherent  Volume:  Normal  Mood:  Anxious  Affect:  NA  Thought Process:  Goal Directed  Orientation:  Full (Time, Place, and Person)  Thought Content:  WDL  Suicidal Thoughts:  No  Homicidal Thoughts:  No  Memory:  Immediate;   Good Recent;   Fair Remote;   Fair  Judgement:  Fair  Insight:  Shallow  Psychomotor Activity:  NA  Concentration:  Concentration: Fair and Attention Span: Fair  Recall:  Good  Fund of Knowledge:  Good  Language:  Good  Akathisia:  No  Handed:  Right  AIMS (if indicated):     Assets:  Communication Skills Desire for Improvement Housing Social Support  ADL's:  Intact  Cognition:  WNL  Sleep:   fair      Assessment and Plan: Major depressive disorder, recurrent.  Anxiety.  I reviewed blood work results.  Her hemoglobin A1c increased from the past.  Now her PCP added Jardiance but she is not happy and like to get refer to endocrinologist.  She requesting notes to be sent to PCP.  We will send a message.  Patient does not want to change the psychotropic medication.  Continue Wellbutrin XL 300 mg daily and mirtazapine 30 mg at bedtime.  Recommended to call us back if she is any question or any concern.  She like to send her medication to "my pharmacy "' at [facility].  Follow-up in 3 months.  Follow Up Instructions:    I discussed the assessment and treatment plan with the patient. The patient was provided an opportunity to ask questions and all were answered. The patient agreed with the plan and demonstrated an understanding of the instructions.   The patient was advised to call back or seek an in-person evaluation if the  symptoms worsen or if the condition fails to improve as anticipated.  I provided 18 minutes of non-face-to-face time during this encounter.   Kathlee Nations, MD

## 2021-12-11 ENCOUNTER — Other Ambulatory Visit: Payer: Self-pay | Admitting: Family Medicine

## 2021-12-11 DIAGNOSIS — E1169 Type 2 diabetes mellitus with other specified complication: Secondary | ICD-10-CM

## 2021-12-22 ENCOUNTER — Other Ambulatory Visit: Payer: Self-pay | Admitting: Family Medicine

## 2021-12-23 ENCOUNTER — Other Ambulatory Visit: Payer: Self-pay | Admitting: Family Medicine

## 2022-01-05 NOTE — Patient Instructions (Incomplete)
Thank you for coming to see me today. It was a pleasure. Today we talked about:   ***  Please follow-up with *** in ***  If you have any questions or concerns, please do not hesitate to call the office at [phone].  Best,   Carollee Leitz, MD

## 2022-01-07 ENCOUNTER — Ambulatory Visit: Payer: 59

## 2022-01-07 ENCOUNTER — Other Ambulatory Visit: Payer: Self-pay

## 2022-01-07 DIAGNOSIS — E119 Type 2 diabetes mellitus without complications: Secondary | ICD-10-CM

## 2022-01-07 DIAGNOSIS — M2141 Flat foot [pes planus] (acquired), right foot: Secondary | ICD-10-CM

## 2022-01-07 DIAGNOSIS — M2041 Other hammer toe(s) (acquired), right foot: Secondary | ICD-10-CM

## 2022-01-07 NOTE — Progress Notes (Signed)
SITUATION ?Reason for Consult: Evaluation for Prefabricated Diabetic Shoes and Custom Diabetic Inserts. ?Patient / Caregiver Report: Patient would like well fitting shoes ? ?OBJECTIVE DATA: ? ?Patient History / Diagnosis:  ?  ICD-10-CM   ?1. Diabetes mellitus without complication (HCC)  W38.8   ?  ?2. Pes planus of both feet  M21.41   ? M21.42   ?  ?3. Hammer toes of both feet  M20.41   ? M20.42   ?  ? ? ?Current or Previous Devices:   None and no history ? ?In-Person Foot Examination: ?Ulcers & Callousing:   None ? ?Deformities:   ?- Pes Planus ?- Hammertoes ?  ?Shoe Size: 7.32M ? ?ORTHOTIC RECOMMENDATION ?Recommended Devices: ?- 1x pair prefabricated PDAC approved diabetic shoes; Patient Selected - [name] 849 Black Size 7.32M ?- 3x pair custom-to-patient PDAC approved vacuum formed diabetic insoles. ? ?GOALS OF SHOES AND INSOLES ?- Reduce shear and pressure ?- Reduce / Prevent callus formation ?- Reduce / Prevent ulceration ?- Protect the fragile healing compromised diabetic foot. ? ?Patient would benefit from diabetic shoes and inserts as patient has diabetes mellitus and the patient has one or more of the following conditions: ?- Peripheral neuropathy with evidence of callus formation ?- Foot deformity ?- Poor circulation ? ?ACTIONS PERFORMED ?Patient was casted for insoles via crush box and measured for shoes via brannock device. Procedure was explained and patient tolerated procedure well. All questions were answered and concerns addressed. ? ?PLAN ?Patient is to be contacted and scheduled for fitting once CMN is obtained from treaing physician and shoes and insoles have been fabricated and received. ? ?

## 2022-01-10 ENCOUNTER — Ambulatory Visit: Payer: 59 | Admitting: Family Medicine

## 2022-01-10 NOTE — Progress Notes (Deleted)
    SUBJECTIVE:   CHIEF COMPLAINT / HPI:   ***  PERTINENT  PMH / PSH: ***  OBJECTIVE:   There were no vitals taken for this visit.   General: Alert, no acute distress Cardio: Normal S1 and S2, RRR, no r/m/g Pulm: CTAB, normal work of breathing Abdomen: Bowel sounds normal. Abdomen soft and non-tender.  Extremities: No peripheral edema.  Neuro: Cranial nerves grossly intact   ASSESSMENT/PLAN:   No problem-specific Assessment & Plan notes found for this encounter.     Terreon Ekholm, MD [facility]   

## 2022-01-13 ENCOUNTER — Telehealth: Payer: Self-pay

## 2022-01-13 NOTE — Telephone Encounter (Signed)
Loyal Gambler, NP with [employer], calls nurse line regarding abnormal finding during exam.  ? ?Reports that patient had abnormal PAD screening in both legs.  ? ?Patient has office visit scheduled for 3/28. Will forward to PCP as FYI.  ? ?Talbot Grumbling, RN ? ?

## 2022-01-17 ENCOUNTER — Other Ambulatory Visit: Payer: Self-pay | Admitting: Family Medicine

## 2022-01-17 DIAGNOSIS — M8580 Other specified disorders of bone density and structure, unspecified site: Secondary | ICD-10-CM

## 2022-01-21 ENCOUNTER — Ambulatory Visit (INDEPENDENT_AMBULATORY_CARE_PROVIDER_SITE_OTHER): Payer: 59 | Admitting: Family Medicine

## 2022-01-21 ENCOUNTER — Other Ambulatory Visit: Payer: Self-pay

## 2022-01-21 ENCOUNTER — Encounter: Payer: Self-pay | Admitting: Family Medicine

## 2022-01-21 VITALS — BP 122/64 | HR 64 | Ht 61.0 in | Wt 154.2 lb

## 2022-01-21 DIAGNOSIS — I152 Hypertension secondary to endocrine disorders: Secondary | ICD-10-CM

## 2022-01-21 DIAGNOSIS — E785 Hyperlipidemia, unspecified: Secondary | ICD-10-CM

## 2022-01-21 DIAGNOSIS — E1159 Type 2 diabetes mellitus with other circulatory complications: Secondary | ICD-10-CM | POA: Diagnosis not present

## 2022-01-21 DIAGNOSIS — E1169 Type 2 diabetes mellitus with other specified complication: Secondary | ICD-10-CM | POA: Diagnosis not present

## 2022-01-21 DIAGNOSIS — I739 Peripheral vascular disease, unspecified: Secondary | ICD-10-CM

## 2022-01-21 DIAGNOSIS — Z0000 Encounter for general adult medical examination without abnormal findings: Secondary | ICD-10-CM

## 2022-01-21 DIAGNOSIS — R350 Frequency of micturition: Secondary | ICD-10-CM | POA: Diagnosis not present

## 2022-01-21 DIAGNOSIS — N898 Other specified noninflammatory disorders of vagina: Secondary | ICD-10-CM

## 2022-01-21 MED ORDER — CLOTRIMAZOLE 1 % EX CREA: 1.0000 "application " | TOPICAL_CREAM | Freq: Two times a day (BID) | CUTANEOUS | 1 refills | Status: DC

## 2022-01-21 MED ORDER — METFORMIN HCL ER (MOD) 1000 MG PO TB24: ORAL_TABLET | ORAL | 1 refills | Status: DC

## 2022-01-21 MED ORDER — CLONIDINE HCL 0.1 MG PO TABS: 0.0500 mg | ORAL_TABLET | Freq: Every day | ORAL | 0 refills | Status: DC

## 2022-01-21 NOTE — Patient Instructions (Addendum)
Thank you for coming to see me today. It was a pleasure.  ? ?You are due for a colonoscopy.  Referral has been sent. Please call to schedule an appointment. ? ?We will get some labs today.  If they are abnormal or we need to do something about them, I will call you.  If they are normal, I will send you a message on MyChart (if it is active) or a letter in the mail.  If you don't hear from [place] in 2 weeks, please call the office at the number below.  ? ?Follow up with your Bone scan scheduled for May 16 at 03/11/2022  3:30 PM ?at [facility] ? ?Take Clonidine 0.05 mg at night.  If your blood pressure consistently stays above 130/80 please let me know. ? ?Use Clotrimazole cream for vaginal itching.  ? ?Continue Jardiance and Metformin. ?Referral was sent to Endocrinology, they will call you with an appointment. ? ?You can drink Glucerna drinks to boost your nutrition. ? ?Please follow-up with PCP in 1-2 weeks ? ?If you have any questions or concerns, please do not hesitate to call the office at [phone]. ? ?Best,  ? ?Carollee Leitz, MD   ?

## 2022-01-21 NOTE — Progress Notes (Signed)
? ? ?  SUBJECTIVE:  ? ?CHIEF COMPLAINT / HPI: Blood pressure check ? ?No acute concerns today. ? ?Hypertension ?Compliant with blood pressure medications.  Denies any visual changes, headaches, shortness of breath, chest pain or lower extremity edema. ? ?Diabetes type 2 ?Tolerating Jardiance.  No hypoglycemic events. ? ?Vaginal itching ?Does note having some vaginal itching since started on Jardiance.  Denies any vaginal discharge. ? ? ?PERTINENT  PMH / PSH:  ?Type 2 diabetes ?Hypertension ?Hyperlipidemia ?COPD ? ?OBJECTIVE:  ? ?BP 122/64   Pulse 64   Ht '5\' 1"'$  (1.549 m)   Wt 154 lb 4 oz (70 kg)   SpO2 98%   BMI 29.15 kg/m?   ? ?General: Alert, no acute distress ?Cardio: Normal S1 and S2, RRR, no r/m/g ?Pulm: CTAB, normal work of breathing ?Abdomen: Bowel sounds normal. Abdomen soft and non-tender.  ?Extremities: No peripheral edema.  ? ?ASSESSMENT/PLAN:  ? ?Hypertension associated with diabetes ([place]) ?Blood pressures well controlled. ?Decrease clonidine to 0.05 mg nightly, plan is to wean off completely. ?Bmet today ?Continue amlodipine 10 mg daily ?Continue hydrochlorothiazide 12.5 mg daily ?Continue Cozaar 25 mg daily ?Continue spironolactone 25 mg daily ?Strict return precautions provided ?Follow-up in 1 to 2 weeks. ? ?Type 2 diabetes mellitus with other specified complication ([place]) ?Last A1c was 10.6.  Started on Jardiance and tolerating well. Too soon for repeat A1c.  I discussed initiating weekly injections to help lower A1c in the past however patient had declined this option.  No hypoglycemic events.  Referral to endocrinology at patient's request per her psychiatrist.  ?We will continue Jardiance and metformin. ?We will recheck on referral to endocrinology ?Follow-up as needed. ? ? ?PAD (peripheral artery disease) ([place]) ?Home screening was positive.  ABI in office today shows severe PAD.  Had no complaints of intermittent claudication today. ?-Referral for vascular studies ?-Continue statin ?-Follow-up  in 1 week ? ?Healthcare maintenance ?Declines zoster vaccination, we will continue to offer vaccine ?Declines: Colonoscopy ?Follow-up yearly for continued health maintenance ? ? ?Hyperlipidemia associated with type 2 diabetes mellitus ([place]) ?Lipid panel today, goal LDL less than 70 ?Continue statin ?Continue Zetia ?Follow-up with results ? ?Itching in the vaginal area ?Ongoing since initiation of Jardiance.  Has not had any vaginal discharge. ?We will trial clotrimazole cream twice daily x5 days.  If symptoms do not resolve we will have patient reschedule for pelvic exam. ? ? ?  ? ? ?Carollee Leitz, MD ?[place]  ?

## 2022-01-22 LAB — BASIC METABOLIC PANEL
BUN/Creatinine Ratio: 10 — ABNORMAL LOW (ref 12–28)
BUN: 13 mg/dL (ref 8–27)
CO2: 21 mmol/L (ref 20–29)
Calcium: 9.8 mg/dL (ref 8.7–10.3)
Chloride: 104 mmol/L (ref 96–106)
Creatinine, Ser: 1.31 mg/dL — ABNORMAL HIGH (ref 0.57–1.00)
Glucose: 199 mg/dL — ABNORMAL HIGH (ref 70–99)
Potassium: 5 mmol/L (ref 3.5–5.2)
Sodium: 141 mmol/L (ref 134–144)
eGFR: 44 mL/min/{1.73_m2} — ABNORMAL LOW (ref >59)

## 2022-01-22 LAB — LIPID PANEL
Chol/HDL Ratio: 3.9 ratio (ref 0.0–4.4)
Cholesterol, Total: 145 mg/dL (ref 100–199)
HDL: 37 mg/dL — ABNORMAL LOW (ref >39)
LDL Chol Calc (NIH): 84 mg/dL (ref 0–99)
Triglycerides: 134 mg/dL (ref 0–149)
VLDL Cholesterol Cal: 24 mg/dL (ref 5–40)

## 2022-01-23 ENCOUNTER — Other Ambulatory Visit: Payer: Self-pay | Admitting: Family Medicine

## 2022-01-23 ENCOUNTER — Telehealth: Payer: Self-pay

## 2022-01-23 DIAGNOSIS — I10 Essential (primary) hypertension: Secondary | ICD-10-CM

## 2022-01-23 NOTE — Telephone Encounter (Signed)
Spoke with patient informed her of her US/ABI on Fri. Apr 7th at 3:00 at [facility]. Patient understood and also has [place]. Salvatore Marvel, CMA ? ?

## 2022-01-27 ENCOUNTER — Other Ambulatory Visit: Payer: Self-pay | Admitting: Family Medicine

## 2022-01-27 ENCOUNTER — Encounter: Payer: Self-pay | Admitting: Family Medicine

## 2022-01-27 DIAGNOSIS — Z0000 Encounter for general adult medical examination without abnormal findings: Secondary | ICD-10-CM | POA: Insufficient documentation

## 2022-01-27 DIAGNOSIS — E1169 Type 2 diabetes mellitus with other specified complication: Secondary | ICD-10-CM | POA: Insufficient documentation

## 2022-01-27 DIAGNOSIS — I739 Peripheral vascular disease, unspecified: Secondary | ICD-10-CM | POA: Insufficient documentation

## 2022-01-27 NOTE — Assessment & Plan Note (Signed)
Last A1c was 10.6.  Started on Jardiance and tolerating well. Too soon for repeat A1c.  I discussed initiating weekly injections to help lower A1c in the past however patient had declined this option.  No hypoglycemic events.  Referral to endocrinology at patient's request per her psychiatrist.  ?We will continue Jardiance and metformin. ?We will recheck on referral to endocrinology ?Follow-up as needed. ? ?

## 2022-01-27 NOTE — Assessment & Plan Note (Signed)
Lipid panel today, goal LDL less than 70 ?Continue statin ?Continue Zetia ?Follow-up with results ?

## 2022-01-27 NOTE — Assessment & Plan Note (Signed)
Home screening was positive.  ABI in office today shows severe PAD.  Had no complaints of intermittent claudication today. ?-Referral for vascular studies ?-Continue statin ?-Follow-up in 1 week ?

## 2022-01-27 NOTE — Assessment & Plan Note (Signed)
Ongoing since initiation of Jardiance.  Has not had any vaginal discharge. ?We will trial clotrimazole cream twice daily x5 days.  If symptoms do not resolve we will have patient reschedule for pelvic exam. ?

## 2022-01-27 NOTE — Assessment & Plan Note (Signed)
Declines zoster vaccination, we will continue to offer vaccine ?Declines: Colonoscopy ?Follow-up yearly for continued health maintenance ? ?

## 2022-01-27 NOTE — Assessment & Plan Note (Addendum)
Blood pressures well controlled. ?Decrease clonidine to 0.05 mg nightly, plan is to wean off completely. ?Bmet today ?Continue amlodipine 10 mg daily ?Continue hydrochlorothiazide 12.5 mg daily ?Continue Cozaar 25 mg daily ?Continue spironolactone 25 mg daily ?Strict return precautions provided ?Follow-up in 1 to 2 weeks. ?

## 2022-01-31 ENCOUNTER — Ambulatory Visit (HOSPITAL_COMMUNITY): Admission: RE | Admit: 2022-01-31 | Payer: 59 | Source: Ambulatory Visit

## 2022-02-03 ENCOUNTER — Encounter: Payer: Self-pay | Admitting: Family Medicine

## 2022-02-03 NOTE — Progress Notes (Signed)
Letter sent with results ? ?Carollee Leitz, MD ?[facility]   ?

## 2022-02-17 ENCOUNTER — Other Ambulatory Visit: Payer: Self-pay | Admitting: Family Medicine

## 2022-02-20 ENCOUNTER — Other Ambulatory Visit (HOSPITAL_COMMUNITY): Payer: Self-pay | Admitting: Psychiatry

## 2022-02-20 ENCOUNTER — Other Ambulatory Visit: Payer: Self-pay | Admitting: Family Medicine

## 2022-02-20 DIAGNOSIS — I10 Essential (primary) hypertension: Secondary | ICD-10-CM

## 2022-02-20 DIAGNOSIS — F419 Anxiety disorder, unspecified: Secondary | ICD-10-CM

## 2022-02-20 DIAGNOSIS — F330 Major depressive disorder, recurrent, mild: Secondary | ICD-10-CM

## 2022-02-21 ENCOUNTER — Telehealth: Payer: Self-pay

## 2022-02-21 ENCOUNTER — Other Ambulatory Visit (HOSPITAL_COMMUNITY): Payer: Self-pay

## 2022-02-21 NOTE — Telephone Encounter (Signed)
Prior Auth for patients medication METFORMIN '1000MG'$  ER approved by [employer] from 02/21/22 to 10/26/22. ? ?Key:  [id number] ? ?Patients pharmacy notified. ? ?

## 2022-02-21 NOTE — Telephone Encounter (Signed)
A Prior Authorization was initiated for this patients METFORMIN ER '1000MG'$  through CoverMyMeds.  ? ?Key: [id number] ? ?

## 2022-02-25 ENCOUNTER — Telehealth: Payer: Self-pay

## 2022-02-25 ENCOUNTER — Ambulatory Visit (INDEPENDENT_AMBULATORY_CARE_PROVIDER_SITE_OTHER): Payer: 59 | Admitting: Family Medicine

## 2022-02-25 ENCOUNTER — Encounter: Payer: Self-pay | Admitting: Family Medicine

## 2022-02-25 VITALS — BP 153/92 | HR 80 | Temp 98.2°F | Ht 61.0 in | Wt 152.6 lb

## 2022-02-25 DIAGNOSIS — T4995XA Adverse effect of unspecified topical agent, initial encounter: Secondary | ICD-10-CM

## 2022-02-25 DIAGNOSIS — E1159 Type 2 diabetes mellitus with other circulatory complications: Secondary | ICD-10-CM

## 2022-02-25 DIAGNOSIS — E119 Type 2 diabetes mellitus without complications: Secondary | ICD-10-CM | POA: Diagnosis not present

## 2022-02-25 DIAGNOSIS — R238 Other skin changes: Secondary | ICD-10-CM

## 2022-02-25 DIAGNOSIS — I152 Hypertension secondary to endocrine disorders: Secondary | ICD-10-CM

## 2022-02-25 DIAGNOSIS — E1169 Type 2 diabetes mellitus with other specified complication: Secondary | ICD-10-CM | POA: Diagnosis not present

## 2022-02-25 LAB — POCT GLYCOSYLATED HEMOGLOBIN (HGB A1C): HbA1c, POC (controlled diabetic range): 8.9 % — AB (ref 0.0–7.0)

## 2022-02-25 MED ORDER — LOSARTAN POTASSIUM 50 MG PO TABS: ORAL_TABLET | ORAL | 0 refills | Status: DC

## 2022-02-25 MED ORDER — CLONIDINE HCL 0.1 MG PO TABS: 0.1000 mg | ORAL_TABLET | Freq: Every day | ORAL | 0 refills | Status: DC

## 2022-02-25 NOTE — Progress Notes (Signed)
? ? ?  SUBJECTIVE:  ? ?CHIEF COMPLAINT / HPI: diabetes check, spot on leg ? ?Diabetes Type 2 ?Asymptomatic.  Compliant with current medication.  Tolerating Jardiance. ? ?HTN ?Complaint with medications.  Blood pressure has increased since decreasing Clonidine to 0.05 mg at night.  Would prefer to go back to previous dosing.  Denies any headaches, visual changes, chest pain, shortness of breath or lower extremity swelling. ? ?Right leg sore ?Reports sore area on leg that has been there for few weeks. Thinks may be a bug bite but does not recall being bitten.  Now worsening pain. Has been using iodine without relief.  Reports had small amount of drainage so tried hydrogen peroxide. Denies any fevers or swelling. ? ?PERTINENT  PMH / PSH:  ?DM Type 2 ?HTN ?HLD ? ?OBJECTIVE:  ? ?BP (!) 153/92   Pulse 80   Temp 98.2 ?F (36.8 ?C)   Ht '5\' 1"'$  (1.549 m)   Wt 152 lb 9.6 oz (69.2 kg)   SpO2 100%   BMI 28.83 kg/m?   ? ?General: Alert, no acute distress ?Cardio: Normal S1 and S2, RRR, no r/m/g ?Pulm: CTAB, normal work of breathing ?Abdomen: Bowel sounds normal. Abdomen soft and non-tender.  ?Extremities: No peripheral edema.  ?Derm: crusted area without erythema, edema or drainage noted on lower anterior thigh.  TTP.  No fluctuation or mass appreciated ? ?ASSESSMENT/PLAN:  ? ?Type 2 diabetes mellitus with other specified complication ([place]) ?A1c today improved to 8.9. Discussed initiating injectable medications.  Patient declined use of having to self inject.  ?Continue Metformin 1000 mg BID ?Continue Jardiance 25 mg daily ?Encourage healthy diet and exercise ?Follow up in 6 months ? ?Hypertension associated with diabetes ([place]) ?Repeat BP remains elevated. Compliant with current medications ?Continue Amlodipine 10 mg daily ?Continue Hydrochlorothiazide 12.5 mg daily ?Increase Cozaar to 50 mg daily ?Continue Spironolactone 25 mg daily ?Increase Clonidine to 0.1 mg at night ?Strict return precautions provided ?Follow up in 1  week, will recheck BMet at next visit ? ? ? ?Skin irritation due to topical agent ?Do not think infectious process given no fluctuation, visible drainage, erythema or fevers.  Likely chemical use irritation ?Apply Petrolatum ointment twice a day ?Follow up in 1 week ?Strict return precautions provided ?  ? ? ?Carollee Leitz, MD ?[place]  ?

## 2022-02-25 NOTE — Telephone Encounter (Signed)
My Pharmacy LVM on nurse line asking for prescription change.  ? ?Losartan was sent in as '50mg'$  tablet, however directions sill say take '25mg'$  daily.  ? ?Will forward to ordering provider.  ? ? ?

## 2022-02-25 NOTE — Patient Instructions (Signed)
Thank you for coming to see me today. It was a pleasure.  ? ?Increase Losartan to 50 mg daily ? ?Increase Clonidine to 0.1 mg at night ? ?Use the packet of ointment on your leg twice a day ? ?We will get labs next week ? ?Please follow-up with PCP in 1 week ? ?If you have any questions or concerns, please do not hesitate to call the office at [phone]. ? ?Best,  ? ?Carollee Leitz, MD   ?

## 2022-03-01 ENCOUNTER — Encounter: Payer: Self-pay | Admitting: Family Medicine

## 2022-03-01 DIAGNOSIS — T4995XA Adverse effect of unspecified topical agent, initial encounter: Secondary | ICD-10-CM | POA: Insufficient documentation

## 2022-03-01 DIAGNOSIS — R238 Other skin changes: Secondary | ICD-10-CM | POA: Insufficient documentation

## 2022-03-01 NOTE — Assessment & Plan Note (Addendum)
A1c today improved to 8.9. Discussed initiating injectable medications.  Patient declined use of having to self inject.  ?Continue Metformin 1000 mg BID ?Continue Jardiance 25 mg daily ?Encourage healthy diet and exercise ?Follow up in 6 months ?

## 2022-03-01 NOTE — Assessment & Plan Note (Signed)
Repeat BP remains elevated. Compliant with current medications ?Continue Amlodipine 10 mg daily ?Continue Hydrochlorothiazide 12.5 mg daily ?Increase Cozaar to 50 mg daily ?Continue Spironolactone 25 mg daily ?Increase Clonidine to 0.1 mg at night ?Strict return precautions provided ?Follow up in 1 week, will recheck BMet at next visit ? ? ?

## 2022-03-01 NOTE — Assessment & Plan Note (Signed)
Do not think infectious process given no fluctuation, visible drainage, erythema or fevers.  Likely chemical use irritation ?Apply Petrolatum ointment twice a day ?Follow up in 1 week ?Strict return precautions provided ?

## 2022-03-04 ENCOUNTER — Ambulatory Visit (HOSPITAL_COMMUNITY): Payer: 59 | Attending: Family Medicine

## 2022-03-04 ENCOUNTER — Ambulatory Visit (INDEPENDENT_AMBULATORY_CARE_PROVIDER_SITE_OTHER): Payer: 59 | Admitting: Family Medicine

## 2022-03-04 VITALS — BP 115/59 | HR 64 | Ht 61.0 in | Wt 150.6 lb

## 2022-03-04 DIAGNOSIS — E1159 Type 2 diabetes mellitus with other circulatory complications: Secondary | ICD-10-CM

## 2022-03-04 DIAGNOSIS — T4995XA Adverse effect of unspecified topical agent, initial encounter: Secondary | ICD-10-CM | POA: Diagnosis not present

## 2022-03-04 DIAGNOSIS — R238 Other skin changes: Secondary | ICD-10-CM

## 2022-03-04 DIAGNOSIS — I152 Hypertension secondary to endocrine disorders: Secondary | ICD-10-CM

## 2022-03-04 DIAGNOSIS — L989 Disorder of the skin and subcutaneous tissue, unspecified: Secondary | ICD-10-CM | POA: Diagnosis not present

## 2022-03-04 LAB — POCT SKIN KOH: Skin KOH, POC: NEGATIVE

## 2022-03-04 MED ORDER — FAMOTIDINE 20 MG PO TABS: 20.0000 mg | ORAL_TABLET | Freq: Every day | ORAL | 1 refills | Status: DC

## 2022-03-04 MED ORDER — CLOTRIMAZOLE 1 % EX CREA: 1.0000 "application " | TOPICAL_CREAM | Freq: Two times a day (BID) | CUTANEOUS | 2 refills | Status: AC

## 2022-03-04 NOTE — Progress Notes (Signed)
    SUBJECTIVE:   CHIEF COMPLAINT / HPI: sore on leg  Patient returns to clinic for follow up of sore on right upper thigh.  Reports improvement in pain since starting Petrolatum BID but now itchy. Has had a small amount of clear drainage that has resolved. Denies any fevers, numbness or fevers.  Thinks may have been bitten by bug few months ago but is unsure.    PERTINENT  PMH / PSH:  HTN DM Type 2   OBJECTIVE:   BP (!) 115/59   Pulse 64   Ht '5\' 1"'$  (1.549 m)   Wt 150 lb 9.6 oz (68.3 kg)   SpO2 99%   BMI 28.46 kg/m    General: Alert, no acute distress Derm:  circular rough patch of small vesicles with central irregular hyperpigmented area      ASSESSMENT/PLAN:   Skin irritation due to topical agent Has improved since stopping chemicals and starting Petrolatum.  Now itching.  Likely contact dermatitis given but given that lesion has not healed in more than 3-4 weeks will have patient return to clinic for punch biopsy on Friday. Continue Petrolatum until next visit   Hypertension associated with diabetes ([place]) BP at goal today.  Improved with increase in Clonidine to 0.1 mg at night. Will continue current medications along with this dose of Clonidine as seems to tolerate well.     Carollee Leitz, MD [place]

## 2022-03-04 NOTE — Patient Instructions (Addendum)
Thank you for coming to see me today. It was a pleasure.  ? ?Refill Pepcid 20 mg daily ? ?We will call you with results of skin scrapings today. ? ?Please follow-up with Friday, May 12 at 2:30 PM ? ?If you have any questions or concerns, please do not hesitate to call the office at [phone]. ? ?Best,  ? ?Carollee Leitz, MD   ?

## 2022-03-06 ENCOUNTER — Ambulatory Visit (INDEPENDENT_AMBULATORY_CARE_PROVIDER_SITE_OTHER): Payer: 59 | Admitting: Sports Medicine

## 2022-03-06 DIAGNOSIS — E119 Type 2 diabetes mellitus without complications: Secondary | ICD-10-CM

## 2022-03-06 DIAGNOSIS — L853 Xerosis cutis: Secondary | ICD-10-CM

## 2022-03-06 DIAGNOSIS — M79675 Pain in left toe(s): Secondary | ICD-10-CM

## 2022-03-06 DIAGNOSIS — B351 Tinea unguium: Secondary | ICD-10-CM

## 2022-03-06 DIAGNOSIS — M79674 Pain in right toe(s): Secondary | ICD-10-CM

## 2022-03-06 NOTE — Progress Notes (Signed)
Subjective: ?[name] is a 71 y.o. female patient with history of diabetes who presents to office today complaining of long,mildly painful nails and callus to heels and states that this area seems to be a little darker.  Patient is diabetic last blood sugar not recorded last A1c ? 8 per patient.  Last visit PCP few days ago,Walsh, Lavella Lemons, MD. reports that she is having difficulty healing a wound on her upper thigh.  No other pedal complaints noted. ? ?Patient Active Problem List  ? Diagnosis Date Noted  ? Skin irritation due to topical agent 03/01/2022  ? Type 2 diabetes mellitus with other specified complication ([place]) 94/17/4081  ? PAD (peripheral artery disease) ([place]) 01/27/2022  ? Healthcare maintenance 01/27/2022  ? Type 2 diabetes mellitus with hyperglycemia ([place]) 11/22/2021  ? Constipation 08/29/2021  ? Snoring 08/29/2021  ? Denture sore mouth 08/29/2021  ? Mild cognitive impairment 07/18/2021  ? Dyspnea on exertion 05/22/2021  ? Bilateral lower extremity edema 01/26/2021  ? Mixed urge and stress incontinence 09/07/2018  ? Well controlled type 2 diabetes mellitus ([place]) 02/24/2018  ? Itching in the vaginal area 05/17/2015  ? Allergic rhinitis 04/25/2014  ? Skin lesions 12/08/2013  ? Severe episode of recurrent major depressive disorder ([place]) 09/08/2012  ? Vitamin D deficiency 06/13/2012  ? Sleep difficulties 06/13/2012  ? Hyperlipidemia associated with type 2 diabetes mellitus ([place]) 05/17/2012  ? Migraine 01/20/2012  ? COPD (chronic obstructive pulmonary disease) ([place]) 03/05/2011  ? BACK PAIN WITH RADICULOPATHY 08/17/2008  ? Hypertension associated with diabetes ([place]) 11/05/2007  ? GERD 11/05/2007  ? ?Current Outpatient Medications on File Prior to Visit  ?Medication Sig Dispense Refill  ? fluticasone (FLONASE) 50 MCG/ACT nasal spray Place into the nose.    ? metFORMIN (GLUCOPHAGE) 500 MG tablet Take by mouth.    ? acetaminophen (TYLENOL) 500 MG tablet Take 1,000 mg by mouth every 8 (eight) hours as  needed for moderate pain.    ? alendronate (FOSAMAX) 70 MG tablet TAKE ONE TABLET BY MOUTH EVERY 7 DAYS with a full glass of water and on an empty stomach 4 tablet 6  ? amLODipine (NORVASC) 10 MG tablet TAKE 1 Tablet BY MOUTH ONCE DAILY at bedtime 90 tablet 1  ? atorvastatin (LIPITOR) 80 MG tablet TAKE 1 TABLET(80 MG) BY MOUTH DAILY 90 tablet 3  ? Blood Pressure Monitoring (BLOOD PRESSURE KIT) DEVI Please measure BP at same time each day 1 each 0  ? buPROPion (WELLBUTRIN XL) 300 MG 24 hr tablet Take 1 tablet (300 mg total) by mouth every morning. 90 tablet 0  ? clobetasol ointment (TEMOVATE) 0.05 % APPLY TOPICALLY TO THE AFFECTED AREA TWICE DAILY (Patient not taking: No sig reported) 30 g 1  ? cloNIDine (CATAPRES) 0.1 MG tablet Take 1 tablet (0.1 mg total) by mouth at bedtime. 30 tablet 0  ? clotrimazole (LOTRIMIN) 1 % cream Apply 1 application. topically 2 (two) times daily. 60 g 2  ? empagliflozin (JARDIANCE) 25 MG TABS tablet Take 1 tablet (25 mg total) by mouth daily. 90 tablet 1  ? ezetimibe (ZETIA) 10 MG tablet TAKE 1 Tablet BY MOUTH ONCE DAILY 90 tablet 0  ? famotidine (PEPCID) 20 MG tablet Take 1 tablet (20 mg total) by mouth daily. 30 tablet 1  ? gabapentin (NEURONTIN) 100 MG capsule TAKE 1 Capsule BY MOUTH ONCE DAILY at bedtime 30 capsule 0  ? hydrochlorothiazide (HYDRODIURIL) 12.5 MG tablet TAKE 1 Tablet BY MOUTH ONCE DAILY 30 tablet 4  ? latanoprost (  XALATAN) 0.005 % ophthalmic solution Place 1 drop into both eyes at bedtime.    ? linaclotide (LINZESS) 145 MCG CAPS capsule Take 1 capsule (145 mcg total) by mouth daily before breakfast. 30 capsule 11  ? loratadine (CLARITIN REDITABS) 10 MG dissolvable tablet Take 1 tablet (10 mg total) by mouth daily. As needed for allergy symptoms (Patient not taking: No sig reported) 31 tablet 12  ? losartan (COZAAR) 50 MG tablet TAKE 1 TABLET(50 MG) BY MOUTH DAILY 30 tablet 0  ? metFORMIN (GLUMETZA) 1000 MG (MOD) 24 hr tablet Take 1 tablet two times a day with meals  180 tablet 1  ? mirtazapine (REMERON) 30 MG tablet TAKE 1 TABLET(30 MG) BY MOUTH AT BEDTIME 90 tablet 0  ? propranolol (INDERAL) 40 MG tablet TAKE 1 Tablet BY MOUTH TWICE DAILY 212 tablet 0  ? spironolactone (ALDACTONE) 25 MG tablet Take 1 tablet (25 mg total) by mouth at bedtime. 90 tablet 3  ? triamcinolone ointment (KENALOG) 0.5 % Apply 1 application topically 2 (two) times daily. Do not use for more than 1 week at a time. (Patient not taking: No sig reported) 60 g 3  ? ?No current facility-administered medications on file prior to visit.  ? ?Allergies  ?Allergen Reactions  ? Aspirin Other (See Comments)  ?  Dr told pt not to take asa d/t bleeding ulcer in 1985.  ? Penicillins Nausea Only  ?  Tablets only.  Denies any airway involvement.    ? Latex Rash  ?  Relieved with over the counter medications.  ? ? ?Recent Results (from the past 2160 hour(s))  ?Basic Metabolic Panel     Status: Abnormal  ? Collection Time: 01/21/22  4:17 PM  ?Result Value Ref Range  ? Glucose 199 (H) 70 - 99 mg/dL  ? BUN 13 8 - 27 mg/dL  ? Creatinine, Ser 1.31 (H) 0.57 - 1.00 mg/dL  ? eGFR 44 (L) >59 mL/min/1.73  ? BUN/Creatinine Ratio 10 (L) 12 - 28  ? Sodium 141 134 - 144 mmol/L  ? Potassium 5.0 3.5 - 5.2 mmol/L  ? Chloride 104 96 - 106 mmol/L  ? CO2 21 20 - 29 mmol/L  ? Calcium 9.8 8.7 - 10.3 mg/dL  ?Lipid Panel     Status: Abnormal  ? Collection Time: 01/21/22  4:17 PM  ?Result Value Ref Range  ? Cholesterol, Total 145 100 - 199 mg/dL  ? Triglycerides 134 0 - 149 mg/dL  ? HDL 37 (L) >39 mg/dL  ? VLDL Cholesterol Cal 24 5 - 40 mg/dL  ? LDL Chol Calc (NIH) 84 0 - 99 mg/dL  ? Chol/HDL Ratio 3.9 0.0 - 4.4 ratio  ?  Comment:                                   T. Chol/HDL Ratio ?                                            Men  Women ?                              1/2 Avg.Risk  3.4    3.3 ?  Avg.Risk  5.0    4.4 ?                               2X Avg.Risk  9.6    7.1 ?                               3X Avg.Risk  23.4   11.0 ?  ?HgB A1c     Status: Abnormal  ? Collection Time: 02/25/22  2:07 PM  ?Result Value Ref Range  ? Hemoglobin A1C    ? HbA1c POC (<> result, manual entry)    ? HbA1c, POC (prediabetic range)    ? HbA1c, POC (controlled diabetic range) 8.9 (A) 0.0 - 7.0 %  ?POCT Skin KOH     Status: None  ? Collection Time: 03/04/22 11:25 AM  ?Result Value Ref Range  ? Skin KOH, POC Negative Negative  ? ? ?Objective: ?General: Patient is awake, alert, and oriented x 3 and in no acute distress. ? ?Integument: Skin is warm, dry and supple bilateral. Nails are tender, long, thickened and dystrophic with subungual debris, consistent with onychomycosis, 1-5 bilateral. No signs of infection.  Diffuse dryness to heels bilateral and advised patient likely the dark color is from her walking around barefoot and slippers on and unchanged melanin spots plantar feet which are normal, Remaining integument unremarkable. ? ?Vasculature:  Dorsalis Pedis pulse 1/4 bilateral. Posterior Tibial pulse  1/4 bilateral. Capillary fill time <3 sec 1-5 bilateral. Positive hair growth to the level of the digits. ?Temperature gradient within normal limits. No varicosities present bilateral. No edema present bilateral.  ? ?Neurology: The patient has intact sensation measured with a 5.07/10g Semmes Weinstein Monofilament at all pedal sites bilateral . Vibratory sensation intact bilateral with tuning fork. No Babinski sign present bilateral.  ? ?Musculoskeletal: Pes planus foot type.  Minimal hammertoe deformity.  No current pain at heels, muscular strength 5/5 in all lower extremity muscular groups bilateral without pain on range of motion.  ? ?Assessment and Plan: ?Problem List Items Addressed This Visit   ?None ?Visit Diagnoses   ? ? Pain due to onychomycosis of toenails of both feet    -  Primary  ? Dry skin      ? Diabetes mellitus without complication ([place])      ? Relevant Medications  ? metFORMIN (GLUCOPHAGE) 500 MG tablet  ? ?  ? ?-Examined  patient. ?-Re-Discussed and educated patient on diabetic foot care, especially with  ?regards to the vascular, neurological and musculoskeletal systems.  ?-Mechanically debrided all painful nails 1-5 bilater

## 2022-03-07 ENCOUNTER — Encounter: Payer: Self-pay | Admitting: Family Medicine

## 2022-03-07 ENCOUNTER — Ambulatory Visit (INDEPENDENT_AMBULATORY_CARE_PROVIDER_SITE_OTHER): Payer: 59 | Admitting: Family Medicine

## 2022-03-07 VITALS — BP 129/74 | HR 72 | Ht 61.0 in | Wt 152.0 lb

## 2022-03-07 DIAGNOSIS — R238 Other skin changes: Secondary | ICD-10-CM | POA: Diagnosis not present

## 2022-03-07 DIAGNOSIS — T4995XA Adverse effect of unspecified topical agent, initial encounter: Secondary | ICD-10-CM

## 2022-03-07 NOTE — Progress Notes (Signed)
? ? ?  SUBJECTIVE:  ? ?CHIEF COMPLAINT / HPI: follow up rash ? ?Initially scheduled for punch biopsy of nonhealing rash.  Patient has decided not to pursue biopsy.  Area on right leg has improved since using Petrolatum.  Continues to remain itchy. ? ?PERTINENT  PMH / PSH:  ?HTN ?DM Type 2 ? ? ?OBJECTIVE:  ? ?BP 129/74   Pulse 72   Ht '5\' 1"'$  (1.549 m)   Wt 152 lb (68.9 kg)   SpO2 100%   BMI 28.72 kg/m?   ? ?General: Alert, no acute distress ?Derm: improving scaling rash with hyperpigmented central area ? ? ? ? ?ASSESSMENT/PLAN:  ? ?Skin irritation due to topical agent ?Continues to improve but remains itchy ?Declined punch biopsy today ?Triamcinolone 1% BID ?If no improvement in 3-5 days will refer to wound care. ?  ? ? ?Carollee Leitz, MD ?[place]  ?

## 2022-03-07 NOTE — Patient Instructions (Signed)
Thank you for coming to see me today. It was a pleasure.  ? ?Use triamcinolone ointment twice a day on the area of your leg.  If you do not see any difference by Monday please let me know and we can refer to wound care. ? ?Please follow-up with PCP if symptoms worsen ? ?If you have any questions or concerns, please do not hesitate to call the office at [phone]. ? ?Best,  ? ?Carollee Leitz, MD   ?

## 2022-03-09 ENCOUNTER — Encounter: Payer: Self-pay | Admitting: Family Medicine

## 2022-03-09 NOTE — Assessment & Plan Note (Signed)
BP at goal today.  Improved with increase in Clonidine to 0.1 mg at night. ?Will continue current medications along with this dose of Clonidine as seems to tolerate well. ?

## 2022-03-09 NOTE — Assessment & Plan Note (Signed)
Has improved since stopping chemicals and starting Petrolatum.  Now itching.  Likely contact dermatitis given but given that lesion has not healed in more than 3-4 weeks will have patient return to clinic for punch biopsy on Friday. ?Continue Petrolatum until next visit ? ?

## 2022-03-10 ENCOUNTER — Other Ambulatory Visit: Payer: Self-pay | Admitting: Family Medicine

## 2022-03-10 ENCOUNTER — Telehealth: Payer: Self-pay

## 2022-03-10 NOTE — Telephone Encounter (Signed)
Casts released from fabrication hold ? ?Shoes ordered - [place] [address] 7[address] ?

## 2022-03-11 ENCOUNTER — Encounter: Payer: Self-pay | Admitting: Family Medicine

## 2022-03-11 ENCOUNTER — Inpatient Hospital Stay: Admission: RE | Admit: 2022-03-11 | Payer: 59 | Source: Ambulatory Visit

## 2022-03-11 NOTE — Assessment & Plan Note (Signed)
Continues to improve but remains itchy ?Declined punch biopsy today ?Triamcinolone 1% BID ?If no improvement in 3-5 days will refer to wound care. ?

## 2022-03-12 ENCOUNTER — Telehealth (HOSPITAL_COMMUNITY): Payer: 59 | Admitting: Psychiatry

## 2022-03-25 ENCOUNTER — Other Ambulatory Visit: Payer: Self-pay | Admitting: Family Medicine

## 2022-04-01 ENCOUNTER — Encounter: Payer: Self-pay | Admitting: *Deleted

## 2022-04-08 ENCOUNTER — Encounter: Payer: Self-pay | Admitting: Family Medicine

## 2022-04-08 ENCOUNTER — Ambulatory Visit (INDEPENDENT_AMBULATORY_CARE_PROVIDER_SITE_OTHER): Payer: 59 | Admitting: Family Medicine

## 2022-04-08 DIAGNOSIS — I10 Essential (primary) hypertension: Secondary | ICD-10-CM | POA: Diagnosis not present

## 2022-04-08 DIAGNOSIS — M8580 Other specified disorders of bone density and structure, unspecified site: Secondary | ICD-10-CM | POA: Diagnosis not present

## 2022-04-08 DIAGNOSIS — R238 Other skin changes: Secondary | ICD-10-CM

## 2022-04-08 DIAGNOSIS — E1169 Type 2 diabetes mellitus with other specified complication: Secondary | ICD-10-CM

## 2022-04-08 DIAGNOSIS — K219 Gastro-esophageal reflux disease without esophagitis: Secondary | ICD-10-CM

## 2022-04-08 DIAGNOSIS — G479 Sleep disorder, unspecified: Secondary | ICD-10-CM

## 2022-04-08 DIAGNOSIS — E785 Hyperlipidemia, unspecified: Secondary | ICD-10-CM

## 2022-04-08 DIAGNOSIS — E1159 Type 2 diabetes mellitus with other circulatory complications: Secondary | ICD-10-CM

## 2022-04-08 DIAGNOSIS — I152 Hypertension secondary to endocrine disorders: Secondary | ICD-10-CM

## 2022-04-08 NOTE — Progress Notes (Signed)
    SUBJECTIVE:   CHIEF COMPLAINT / HPI: follow up skin rash  Patient reports that area on upper thigh has not gotten better with Triamcinolone use.  Continues to remain itchy.  Now more open to biopsy.  PERTINENT  PMH / PSH:  HTN DM Type 2 Sleep difficulties  OBJECTIVE:   BP 119/90   Pulse 61   Ht '5\' 1"'$  (1.549 m)   Wt 147 lb 12.8 oz (67 kg)   SpO2 100%   BMI 27.93 kg/m    General: Alert, no acute distress Cardio: Normal S1 and S2, RRR, no r/m/g Pulm: CTAB, normal work of breathing Derm: circular hyperpigmented scaling area on upper anterior thigh appears bigger in diameter from previous visit Today's image   Image from 05/12   Image from 05/09   ASSESSMENT/PLAN:   Skin irritation due to topical agent Initially thought to have been due to use of topical agent for cleansing. Treated with Petrolatum and Triamcinolone without any improvement. Previous KOH skin negative.  Planned for biopsy and during visit patient had declined.  Now more open to have biopsy. -Scheduled for biopsy June 23 -Recommend to avoid use of creams/ointments until that time.   Sleep difficulties Had been taking Clonidine for years.  Was on 0.3 mg, now at 0.1 mg.  Reports has been taking 1.5 tabs to help with sleep.  -Increase Clonidine to 0.2 mg qhs -Follow up at next visit -Monitor for decrease in BP -Strict return precautions provided  Hypertension associated with diabetes ([place]) At goal Continue Amlodipine 10 mg daily Continue Losartan 50 mg daily Continue HCTZ 12.5 mg daily Continue Spironolactone 25 mg daily With increase in Clonidine to 0.2 mg qhs today will continue to adjust medications accordingly  Follow up with PCP in 2 weeks Strict return precautions provided  Hyperlipidemia associated with type 2 diabetes mellitus ([place]) Refill Atorvastatin Refill Zetia  Osteoporosis Continue Fosamax, reevaluate in 2026 Dexa scan pending   GERD Refill Famotidine  Type 2 diabetes  mellitus with other specified complication ([place]) Refill Jardiance Refill Metformin A1c due in 2 months   HCM Recommend Colonoscopy, patient declined Recommend Shingles Vaccines   Carollee Leitz, MD [place]

## 2022-04-08 NOTE — Patient Instructions (Addendum)
Thank you for coming to see me today. It was a pleasure.   Last A1c 8.9 in May.  Will repeat in August.  Please schedule an appointment for biopsy earliest convenience.  I will increase her clonidine to 0.2 mg at night  Recommend Shingles vaccine.  This is a 2 dose series and can be given at your local pharmacy.  Please talk to your pharmacist about this.   You are due for a colonoscopy.  Please use the form that we have given you to schedule this at your convenience.    Please follow-up with PCP 2 months  If you have any questions or concerns, please do not hesitate to call the office at [phone].  Best,   Carollee Leitz, MD

## 2022-04-09 ENCOUNTER — Telehealth (HOSPITAL_BASED_OUTPATIENT_CLINIC_OR_DEPARTMENT_OTHER): Payer: 59 | Admitting: Psychiatry

## 2022-04-09 ENCOUNTER — Encounter (HOSPITAL_COMMUNITY): Payer: Self-pay | Admitting: Psychiatry

## 2022-04-09 DIAGNOSIS — F330 Major depressive disorder, recurrent, mild: Secondary | ICD-10-CM | POA: Diagnosis not present

## 2022-04-09 DIAGNOSIS — F419 Anxiety disorder, unspecified: Secondary | ICD-10-CM

## 2022-04-09 MED ORDER — LOSARTAN POTASSIUM 50 MG PO TABS: ORAL_TABLET | ORAL | 2 refills | Status: DC

## 2022-04-09 MED ORDER — METFORMIN HCL ER (MOD) 1000 MG PO TB24: 1000.0000 mg | ORAL_TABLET | Freq: Two times a day (BID) | ORAL | 2 refills | Status: DC

## 2022-04-09 MED ORDER — PROPRANOLOL HCL 40 MG PO TABS: 40.0000 mg | ORAL_TABLET | Freq: Two times a day (BID) | ORAL | 2 refills | Status: DC

## 2022-04-09 MED ORDER — HYDROCHLOROTHIAZIDE 12.5 MG PO TABS: 12.5000 mg | ORAL_TABLET | Freq: Every day | ORAL | 2 refills | Status: DC

## 2022-04-09 MED ORDER — EMPAGLIFLOZIN 25 MG PO TABS: 25.0000 mg | ORAL_TABLET | Freq: Every day | ORAL | 2 refills | Status: DC

## 2022-04-09 MED ORDER — SPIRONOLACTONE 25 MG PO TABS: 25.0000 mg | ORAL_TABLET | Freq: Every day | ORAL | 2 refills | Status: DC

## 2022-04-09 MED ORDER — CLOBETASOL PROPIONATE 0.05 % EX OINT: TOPICAL_OINTMENT | CUTANEOUS | 2 refills | Status: AC

## 2022-04-09 MED ORDER — GABAPENTIN 100 MG PO CAPS: 100.0000 mg | ORAL_CAPSULE | Freq: Every day | ORAL | 2 refills | Status: DC

## 2022-04-09 MED ORDER — CLONIDINE HCL 0.2 MG PO TABS: 0.2000 mg | ORAL_TABLET | Freq: Every day | ORAL | 3 refills | Status: DC

## 2022-04-09 MED ORDER — EZETIMIBE 10 MG PO TABS: 10.0000 mg | ORAL_TABLET | Freq: Every day | ORAL | 2 refills | Status: DC

## 2022-04-09 MED ORDER — BUPROPION HCL ER (XL) 300 MG PO TB24: 300.0000 mg | ORAL_TABLET | ORAL | 0 refills | Status: DC

## 2022-04-09 MED ORDER — ATORVASTATIN CALCIUM 80 MG PO TABS: ORAL_TABLET | ORAL | 3 refills | Status: AC

## 2022-04-09 MED ORDER — FAMOTIDINE 20 MG PO TABS
ORAL_TABLET | ORAL | 2 refills | Status: DC
Start: 2022-04-09 — End: 2022-12-22

## 2022-04-09 MED ORDER — MIRTAZAPINE 30 MG PO TABS: ORAL_TABLET | ORAL | 0 refills | Status: DC

## 2022-04-09 MED ORDER — ALENDRONATE SODIUM 70 MG PO TABS: ORAL_TABLET | ORAL | 6 refills | Status: DC

## 2022-04-09 NOTE — Progress Notes (Signed)
Virtual Visit via Telephone Note  I connected with [name] on 04/09/22 at  3:00 PM EDT by telephone and verified that I am speaking with the correct person using two identifiers.  Location: Patient: Home Provider: Home Office   I discussed the limitations, risks, security and privacy concerns of performing an evaluation and management service by telephone and the availability of in person appointments. I also discussed with the patient that there may be a patient responsible charge related to this service. The patient expressed understanding and agreed to proceed.   History of Present Illness: Patient is evaluated by phone session.  She is stable on her current medication but concerned about her physical health issues.  Recently she has a sore on her right leg and recently saw her physician.  She has to find a new PCP because her physician is moving out.  She is also not sleeping and her physician is trying to adjust her clonidine dose.  Overall she think mood is stable and denies any crying spells or any feeling of hopelessness or worthlessness.  She had lost more than 15 pounds and that helps her diabetes.  Her last hemoglobin A1c is better than before.  She denies any agitation, anger, crying spells or any feeling of hopelessness or worthlessness.  Her creatinine is slightly increased from the past and she has upcoming appointment to see her physician in November.  She like to keep her mirtazapine and Wellbutrin.  She has upcoming visit to [facility] with her daughter and she is looking forward to have that trip.  Patient denies drinking or using any illegal substances.  Past Psychiatric History: Reviewed. H/O inpatient treatment at [facility] to detox from Xanax.  Saw Psychiatrist in [place], [place] and [facility].  Tried Paxil, Prozac, vistaril and Zoloft.  No h/o suicidal attempt or any psychosis.  Psychiatric Specialty Exam: Physical Exam  Review of  Systems  Weight 147 lb (66.7 kg).There is no height or weight on file to calculate BMI.  General Appearance: NA  Eye Contact:  NA  Speech:  Normal Rate  Volume:  Normal  Mood:  Anxious  Affect:  NA  Thought Process:  Goal Directed  Orientation:  Full (Time, Place, and Person)  Thought Content:  Logical  Suicidal Thoughts:  No  Homicidal Thoughts:  No  Memory:  Immediate;   Good Recent;   Good Remote;   Good  Judgement:  Intact  Insight:  Present  Psychomotor Activity:  NA  Concentration:  Concentration: Fair and Attention Span: Fair  Recall:  Good  Fund of Knowledge:  Good  Language:  Good  Akathisia:  No  Handed:  Right  AIMS (if indicated):     Assets:  Communication Skills Desire for Improvement Housing Resilience Social Support  ADL's:  Intact  Cognition:  WNL  Sleep:   fair      Assessment and Plan: Major depressive disorder, recurrent.  Anxiety.  Patient is stable on her medication.  She has insomnia and recently her physician is trying to adjust her clonidine to help her blood pressure which also helps sleep.  She does not want to change the medication.  Continue Wellbutrin XL 300 mg daily and mirtazapine 30 mg at bedtime.  I reviewed blood work results.  Her blood sugar is improved from the past but is still high.  She is also trying to lose weight and she had lost more than 15 pounds in past 3 months.  I recommended to call us back if is any question or any concern.  Follow-up in 3 months.  Follow Up Instructions:    I discussed the assessment and treatment plan with the patient. The patient was provided an opportunity to ask questions and all were answered. The patient agreed with the plan and demonstrated an understanding of the instructions.   The patient was advised to call back or seek an in-person evaluation if the symptoms worsen or if the condition fails to improve as anticipated.  Collaboration of Care: Primary Care Provider AEB notes are available  in epic to review.  Patient/Guardian was advised Release of Information must be obtained prior to any record release in order to collaborate their care with an outside provider. Patient/Guardian was advised if they have not already done so to contact the registration department to sign all necessary forms in order for [place] to release information regarding their care.   Consent: Patient/Guardian gives verbal consent for treatment and assignment of benefits for services provided during this visit. Patient/Guardian expressed understanding and agreed to proceed.    I provided 20 minutes of non-face-to-face time during this encounter.   Kathlee Nations, MD

## 2022-04-11 ENCOUNTER — Encounter: Payer: Self-pay | Admitting: Family Medicine

## 2022-04-11 DIAGNOSIS — M810 Age-related osteoporosis without current pathological fracture: Secondary | ICD-10-CM | POA: Insufficient documentation

## 2022-04-11 NOTE — Assessment & Plan Note (Signed)
Refill Atorvastatin Refill Zetia

## 2022-04-11 NOTE — Assessment & Plan Note (Signed)
Had been taking Clonidine for years.  Was on 0.3 mg, now at 0.1 mg.  Reports has been taking 1.5 tabs to help with sleep.  -Increase Clonidine to 0.2 mg qhs -Follow up at next visit -Monitor for decrease in BP -Strict return precautions provided

## 2022-04-11 NOTE — Assessment & Plan Note (Signed)
Refill Jardiance Refill Metformin A1c due in 2 months

## 2022-04-11 NOTE — Assessment & Plan Note (Signed)
Initially thought to have been due to use of topical agent for cleansing. Treated with Petrolatum and Triamcinolone without any improvement. Previous KOH skin negative.  Planned for biopsy and during visit patient had declined.  Now more open to have biopsy. -Scheduled for biopsy June 23 -Recommend to avoid use of creams/ointments until that time.

## 2022-04-11 NOTE — Assessment & Plan Note (Signed)
At goal Continue Amlodipine 10 mg daily Continue Losartan 50 mg daily Continue HCTZ 12.5 mg daily Continue Spironolactone 25 mg daily With increase in Clonidine to 0.2 mg qhs today will continue to adjust medications accordingly  Follow up with PCP in 2 weeks Strict return precautions provided

## 2022-04-11 NOTE — Assessment & Plan Note (Signed)
Refill Famotidine

## 2022-04-11 NOTE — Assessment & Plan Note (Signed)
Continue Fosamax, reevaluate in 2026 Dexa scan pending

## 2022-04-16 ENCOUNTER — Other Ambulatory Visit: Payer: Self-pay | Admitting: Family Medicine

## 2022-04-18 ENCOUNTER — Ambulatory Visit: Payer: 59 | Admitting: Family Medicine

## 2022-04-24 ENCOUNTER — Ambulatory Visit: Payer: 59 | Admitting: Family Medicine

## 2022-04-24 NOTE — Progress Notes (Deleted)
err

## 2022-05-01 ENCOUNTER — Other Ambulatory Visit (HOSPITAL_COMMUNITY): Payer: Self-pay | Admitting: Psychiatry

## 2022-05-01 DIAGNOSIS — F330 Major depressive disorder, recurrent, mild: Secondary | ICD-10-CM

## 2022-05-01 DIAGNOSIS — F419 Anxiety disorder, unspecified: Secondary | ICD-10-CM

## 2022-05-14 ENCOUNTER — Other Ambulatory Visit: Payer: Self-pay | Admitting: Family Medicine

## 2022-05-30 ENCOUNTER — Telehealth: Payer: Self-pay | Admitting: Podiatry

## 2022-05-30 NOTE — Telephone Encounter (Signed)
LVM FOR PT TO CALL BACK TO GET SCHEDULED ASAP FOR DM SHOE PICK UP AUTH WILL EXPIRE ON 08/09 BUT I WILL RESEND IT TO THE TREATING PHYSICIAN IF SHE IS UNABLE TO COME IN BEFORE THE 9TH.

## 2022-06-10 ENCOUNTER — Ambulatory Visit: Payer: 59 | Admitting: Podiatry

## 2022-06-18 ENCOUNTER — Other Ambulatory Visit (HOSPITAL_COMMUNITY): Payer: Self-pay | Admitting: Psychiatry

## 2022-06-18 DIAGNOSIS — F330 Major depressive disorder, recurrent, mild: Secondary | ICD-10-CM

## 2022-06-18 DIAGNOSIS — F419 Anxiety disorder, unspecified: Secondary | ICD-10-CM

## 2022-06-21 ENCOUNTER — Encounter (HOSPITAL_COMMUNITY): Payer: Self-pay

## 2022-06-21 ENCOUNTER — Other Ambulatory Visit: Payer: Self-pay

## 2022-06-21 ENCOUNTER — Ambulatory Visit (HOSPITAL_COMMUNITY)
Admission: RE | Admit: 2022-06-21 | Discharge: 2022-06-21 | Disposition: A | Discharge status: Home / Self Care | Payer: 59 | Source: Ambulatory Visit | Attending: Family Medicine | Admitting: Family Medicine

## 2022-06-21 VITALS — BP 159/77 | HR 61 | Temp 98.9°F | Resp 20

## 2022-06-21 DIAGNOSIS — H5789 Other specified disorders of eye and adnexa: Secondary | ICD-10-CM
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
## 2022-06-21 NOTE — Discharge Instructions (Signed)
Call your eye doctor to be seen as soon as possible. I did not see any evidence of eye damage here today.
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
## 2022-06-24 ENCOUNTER — Other Ambulatory Visit (HOSPITAL_COMMUNITY): Payer: Self-pay | Admitting: Psychiatry

## 2022-06-24 DIAGNOSIS — F419 Anxiety disorder, unspecified: Secondary | ICD-10-CM

## 2022-06-24 DIAGNOSIS — F330 Major depressive disorder, recurrent, mild: Secondary | ICD-10-CM

## 2022-06-30 ENCOUNTER — Other Ambulatory Visit (HOSPITAL_COMMUNITY): Payer: Self-pay | Admitting: Psychiatry

## 2022-06-30 DIAGNOSIS — F330 Major depressive disorder, recurrent, mild: Secondary | ICD-10-CM

## 2022-07-09 ENCOUNTER — Encounter (HOSPITAL_COMMUNITY): Payer: Self-pay | Admitting: Psychiatry

## 2022-07-09 ENCOUNTER — Telehealth (HOSPITAL_BASED_OUTPATIENT_CLINIC_OR_DEPARTMENT_OTHER): Payer: 59 | Admitting: Psychiatry

## 2022-07-09 DIAGNOSIS — F330 Major depressive disorder, recurrent, mild: Secondary | ICD-10-CM | POA: Diagnosis not present

## 2022-07-09 DIAGNOSIS — F419 Anxiety disorder, unspecified: Secondary | ICD-10-CM | POA: Diagnosis not present

## 2022-07-09 MED ORDER — BUPROPION HCL ER (XL) 300 MG PO TB24: 300.0000 mg | ORAL_TABLET | ORAL | 0 refills | Status: DC

## 2022-07-09 MED ORDER — MIRTAZAPINE 30 MG PO TABS: ORAL_TABLET | ORAL | 0 refills | Status: DC

## 2022-07-09 NOTE — Progress Notes (Signed)
Virtual Visit via Telephone Note  I connected with [name] on 07/09/22 at  3:00 PM EDT by telephone and verified that I am speaking with the correct person using two identifiers.  Location: Patient: Home Provider: Home Office   I discussed the limitations, risks, security and privacy concerns of performing an evaluation and management service by telephone and the availability of in person appointments. I also discussed with the patient that there may be a patient responsible charge related to this service. The patient expressed understanding and agreed to proceed.   History of Present Illness: Patient is evaluated by phone session.  She is taking her medication as prescribed.  She is concerned about her weight loss and nausea.  She does not eat as much and wondering if her blood sugar is high.  She has not seen her physician in a while.  She had lost 10 pounds in the past few weeks.  Other than that she feels her depression and anxiety is stable.  She sleeps okay.  She denies any crying spells or any feeling of hopelessness or worthlessness.  She has no tremors.  She has chronic health issues but realized that she needed to see the doctor for better management of health issues.  Her last hemoglobin A1c was about 9.  She does support from her daughter but she lives by herself.  She uses the use transportation from the [place] and she admitted that she need to call to arrange transportation so she can go to the doctor.  She does not want to change the medication.   Past Psychiatric History: Reviewed. H/O inpatient treatment at [facility] to detox from Xanax.  Saw Psychiatrist in [place], [place] and [facility].  Tried Paxil, Prozac, vistaril and Zoloft.  No h/o suicidal attempt or any psychosis.  Psychiatric Specialty Exam: Physical Exam  Review of Systems  Weight 132 lb (59.9 kg).There is no height or weight on file to calculate BMI.  General  Appearance: NA  Eye Contact:  NA  Speech:  Slow  Volume:  Normal  Mood:  Anxious  Affect:  NA  Thought Process:  Goal Directed  Orientation:  Full (Time, Place, and Person)  Thought Content:  Logical  Suicidal Thoughts:  No  Homicidal Thoughts:  No  Memory:  Immediate;   Good Recent;   Good Remote;   Good  Judgement:  Intact  Insight:  Present  Psychomotor Activity:  NA  Concentration:  Concentration: Good and Attention Span: Good  Recall:  Good  Fund of Knowledge:  Good  Language:  Good  Akathisia:  No  Handed:  Right  AIMS (if indicated):     Assets:  Communication Skills Desire for Improvement Housing Social Support  ADL's:  Intact  Cognition:  WNL  Sleep:   fair      Assessment and Plan: Major depressive disorder, recurrent.  Anxiety.  Discussed weight loss and nausea.  Agrees she need to have a blood work as patient has not done blood sugar testing in a while.  She feels her depression anxiety is stable and like to keep the current medication.  Continue mirtazapine 30 mg at bedtime and Wellbutrin XL 300 mg daily.  Recommended to call us back if there is any question or any concern.  Follow-up in 3 months.  Follow Up Instructions:    I discussed the assessment and treatment plan with the patient. The patient was provided an opportunity to ask questions and  all were answered. The patient agreed with the plan and demonstrated an understanding of the instructions.   The patient was advised to call back or seek an in-person evaluation if the symptoms worsen or if the condition fails to improve as anticipated.  Collaboration of Care: Primary Care Provider AEB encouraged to keep appointment and have blood work to her primary care physician  Patient/Guardian was advised Release of Information must be obtained prior to any record release in order to collaborate their care with an outside provider. Patient/Guardian was advised if they have not already done so to contact  the registration department to sign all necessary forms in order for [place] to release information regarding their care.   Consent: Patient/Guardian gives verbal consent for treatment and assignment of benefits for services provided during this visit. Patient/Guardian expressed understanding and agreed to proceed.    I provided 17 th minutes of non-face-to-face time during this encounter.   Kathlee Nations, MD

## 2022-08-07 ENCOUNTER — Other Ambulatory Visit: Payer: Self-pay

## 2022-08-07 ENCOUNTER — Encounter (HOSPITAL_COMMUNITY): Payer: Self-pay | Admitting: Emergency Medicine

## 2022-08-07 ENCOUNTER — Emergency Department (HOSPITAL_COMMUNITY)
Admission: EM | Admit: 2022-08-07 | Discharge: 2022-08-08 | Disposition: A | Discharge status: Home / Self Care | Payer: 59 | Attending: Emergency Medicine | Admitting: Emergency Medicine

## 2022-08-07 DIAGNOSIS — H1589 Other disorders of sclera: Secondary | ICD-10-CM | POA: Insufficient documentation

## 2022-08-07 DIAGNOSIS — Z79899 Other long term (current) drug therapy: Secondary | ICD-10-CM | POA: Diagnosis not present

## 2022-08-07 DIAGNOSIS — I10 Essential (primary) hypertension: Secondary | ICD-10-CM | POA: Insufficient documentation

## 2022-08-07 DIAGNOSIS — H5712 Ocular pain, left eye: Secondary | ICD-10-CM
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
## 2022-08-08 DIAGNOSIS — H1589 Other disorders of sclera: Secondary | ICD-10-CM | POA: Diagnosis not present

## 2022-08-08 MED ORDER — TETRACAINE HCL 0.5 % OP SOLN
2.0000 [drp] | Freq: Once | OPHTHALMIC | Status: AC
  Administered 2022-08-08: 2 [drp] via OPHTHALMIC
  Filled 2022-08-08: qty 4

## 2022-08-08 MED ORDER — ERYTHROMYCIN 5 MG/GM OP OINT: TOPICAL_OINTMENT | OPHTHALMIC | 0 refills | Status: DC

## 2022-08-08 MED ORDER — FLUORESCEIN SODIUM 1 MG OP STRP
1.0000 | ORAL_STRIP | Freq: Once | OPHTHALMIC | Status: AC
  Administered 2022-08-08: 1 via OPHTHALMIC
  Filled 2022-08-08: qty 1

## 2022-08-08 NOTE — ED Provider Notes (Signed)
[facility] EMERGENCY DEPARTMENT Provider Note   CSN: [id number] Arrival date & time: 08/07/22  1918     History  Chief Complaint  Patient presents with   Left Eye Injury    [name] is a 71 y.o. female.  Patient is a 71 year old female with a past medical history of hypertension presenting to the emergency department with eye pain.  About 1 month ago the patient was in a room where people were smoking and somebody lit a match and she felt pain on the left eye.  She states that she is unsure if something may have gotten into her eye.  She denies any blurry vision.  She states that she was seen at urgent care at that time and no foreign bodies were seen.  She has been using dry eyedrops without significant relief.  She states that she feels like a scratching type sensation in her eye and feels like something is stuck under her eyelid.  She states that she has not seen an eye doctor since.  The history is provided by the patient.       Home Medications Prior to Admission medications   Medication Sig Start Date End Date Taking? Authorizing Provider  acetaminophen (TYLENOL) 500 MG tablet Take 1,000 mg by mouth every 8 (eight) hours as needed for moderate pain.    [provider]  alendronate (FOSAMAX) 70 MG tablet TAKE ONE TABLET BY MOUTH EVERY 7 DAYS with a full glass of water and on an empty stomach 04/09/22   Carollee Leitz, MD  amLODipine (NORVASC) 10 MG tablet TAKE 1 Tablet BY MOUTH ONCE DAILY at bedtime 05/16/22   Dameron, Luna Fuse, DO  atorvastatin (LIPITOR) 80 MG tablet TAKE 1 TABLET(80 MG) BY MOUTH DAILY 04/09/22   Carollee Leitz, MD  Blood Pressure Monitoring (BLOOD PRESSURE KIT) DEVI Please measure BP at same time each day 03/26/21   Carollee Leitz, MD  buPROPion (WELLBUTRIN XL) 300 MG 24 hr tablet Take 1 tablet (300 mg total) by mouth every morning. 07/09/22 07/09/23  Arfeen, Arlyce Harman, MD  clobetasol ointment (TEMOVATE) 0.05 % APPLY TOPICALLY TO THE  AFFECTED AREA TWICE DAILY 04/09/22   Carollee Leitz, MD  cloNIDine (CATAPRES) 0.2 MG tablet Take 1 tablet (0.2 mg total) by mouth at bedtime. 04/09/22   Carollee Leitz, MD  clotrimazole (LOTRIMIN) 1 % cream Apply 1 application. topically 2 (two) times daily. 03/04/22   Carollee Leitz, MD  empagliflozin (JARDIANCE) 25 MG TABS tablet Take 1 tablet (25 mg total) by mouth daily. 04/09/22   Carollee Leitz, MD  ezetimibe (ZETIA) 10 MG tablet Take 1 tablet (10 mg total) by mouth daily. 04/09/22   Carollee Leitz, MD  famotidine (PEPCID) 20 MG tablet TAKE ONE TABLET BY MOUTH BY MOUTH DAILY 04/09/22   Carollee Leitz, MD  fluticasone [facility]) 50 MCG/ACT nasal spray Place into the nose. 08/25/16   [provider]  gabapentin (NEURONTIN) 100 MG capsule Take 1 capsule (100 mg total) by mouth at bedtime. 04/09/22   Carollee Leitz, MD  hydrochlorothiazide (HYDRODIURIL) 12.5 MG tablet Take 1 tablet (12.5 mg total) by mouth daily. 04/09/22   Carollee Leitz, MD  latanoprost (XALATAN) 0.005 % ophthalmic solution Place 1 drop into both eyes at bedtime. 06/27/20   [provider]  linaclotide Rolan Lipa) 145 MCG CAPS capsule Take 1 capsule (145 mcg total) by mouth daily before breakfast. 08/29/21   Gladys Damme, MD  loratadine (CLARITIN REDITABS) 10 MG dissolvable tablet Take 1 tablet (10  mg total) by mouth daily. As needed for allergy symptoms Patient not taking: No sig reported 02/01/21   Zenia Resides, MD  losartan (COZAAR) 50 MG tablet TAKE 1 TABLET(50 MG) BY MOUTH DAILY 04/09/22   Carollee Leitz, MD  metFORMIN (GLUMETZA) 1000 MG (MOD) 24 hr tablet Take 1 tablet (1,000 mg total) by mouth 2 (two) times daily. Take 1 tablet two times a day with meals 04/09/22 07/08/22  Carollee Leitz, MD  mirtazapine (REMERON) 30 MG tablet TAKE 1 TABLET(30 MG) BY MOUTH AT BEDTIME 07/09/22   Arfeen, Arlyce Harman, MD  propranolol (INDERAL) 40 MG tablet Take 1 tablet (40 mg total) by mouth 2 (two) times daily. 04/09/22 01/04/23  Carollee Leitz, MD   spironolactone (ALDACTONE) 25 MG tablet Take 1 tablet (25 mg total) by mouth at bedtime. 04/09/22   Carollee Leitz, MD      Allergies    Aspirin, Penicillins, and Latex    Review of Systems   Review of Systems  Physical Exam Updated Vital Signs BP 108/89 (BP Location: Right Arm)   Pulse (!) 57   Temp 97.8 F (36.6 C)   Resp 15   SpO2 100%  Physical Exam Vitals and nursing note reviewed.  Constitutional:      General: She is not in acute distress.    Appearance: Normal appearance.  HENT:     Head: Normocephalic and atraumatic.     Nose: Nose normal.     Mouth/Throat:     Mouth: Mucous membranes are moist.     Pharynx: Oropharynx is clear.  Eyes:     Extraocular Movements: Extraocular movements intact.     Conjunctiva/sclera: Conjunctivae normal.     Pupils: Pupils are equal, round, and reactive to light.     Comments: Small brown spots on lateral sclera of left eye Negative fluorescein stain bilaterally No foreign bodies on lid inversion  Cardiovascular:     Rate and Rhythm: Bradycardia present.  Pulmonary:     Effort: Pulmonary effort is normal.  Abdominal:     General: Abdomen is flat.  Musculoskeletal:        General: Normal range of motion.     Cervical back: Normal range of motion and neck supple.  Skin:    General: Skin is warm and dry.  Neurological:     General: No focal deficit present.     Mental Status: She is alert and oriented to person, place, and time.  Psychiatric:        Mood and Affect: Mood normal.        Behavior: Behavior normal.     ED Results / Procedures / Treatments   Labs (all labs ordered are listed, but only abnormal results are displayed) Labs Reviewed - No data to display  EKG None  Radiology No results found.  Procedures Procedures    Medications Ordered in ED Medications  fluorescein ophthalmic strip 1 strip (1 strip Both Eyes Given by Other 08/08/22 0807)  tetracaine (PONTOCAINE) 0.5 % ophthalmic solution 2 drop  (2 drops Both Eyes Given by Other 08/08/22 0807)    ED Course/ Medical Decision Making/ A&P                           Medical Decision Making This patient presents to the ED with chief complaint(s) of eye pain with pertinent past medical history of hypertension which further complicates the presenting complaint. The complaint involves an extensive differential diagnosis and  also carries with it a high risk of complications and morbidity.    The differential diagnosis includes no evidence of pain with extraocular range of motion, no periorbital swelling or ecchymosis and no fevers making orbital or periorbital cellulitis unlikely, no evidence of corneal abrasion or ulceration seen on fluorescein staining making corneal abrasion or ulceration unlikely, no evidence of foreign body visualized, no conjunctival injection and no vision change making conjunctivitis or acute angle-closure glaucoma unlikely, without any vision change retinal detachment, CRAO or CRVO unlikely  Additional history obtained: Additional history obtained from N/A Records reviewed urgent care records, telephone call with ophthalmology office had recommended dry eyedrops and to schedule follow-up if her symptoms are not improved but she has not yet scheduled a follow-up  ED Course and Reassessment: Patient had visual acuity performed which was equal in bilateral eyes at her baseline.  Fluorescein staining showed no evidence of corneal abrasion or ulceration and no evidence of foreign body.  She does have small brown spots on her left lateral sclera that are not obstructing her vision.  Unclear etiology the cause of her symptoms.  She will be given erythromycin ophthalmic ointment and was recommended to follow-up with ophthalmology.  Independent labs interpretation:  N/A  Independent visualization of imaging: N/A  Consultation: - Consulted or discussed management/test interpretation w/ external professional:  N/A  Consideration for admission or further workup: Patient stable for discharge with ophthalmology follow-up Social Determinants of health: N/A    Risk Prescription drug management.          Final Clinical Impression(s) / ED Diagnoses Final diagnoses:  [place] sclera  Eye pain, left    Rx / DC Orders ED Discharge Orders     None         Kemper Durie, DO 08/08/22 [phone]

## 2022-08-08 NOTE — Discharge Instructions (Addendum)
You were seen in the emergency department for your eye pain.  Is unclear what is causing your pain at this time but no signs of any obvious infection or injury to your eye.  You should follow-up with the ophthalmologist for further evaluation.  I given you an antibiotic eye ointment to help symptomatically.  You should return to the emergency department if you are having loss of vision, significantly worsening pain, fevers or if you have any other new or concerning symptoms.

## 2022-08-12 ENCOUNTER — Other Ambulatory Visit: Payer: Self-pay

## 2022-08-12 MED ORDER — METFORMIN HCL ER (MOD) 1000 MG PO TB24: 1000.0000 mg | ORAL_TABLET | Freq: Two times a day (BID) | ORAL | 2 refills | Status: DC

## 2022-08-27 ENCOUNTER — Ambulatory Visit (INDEPENDENT_AMBULATORY_CARE_PROVIDER_SITE_OTHER): Payer: 59

## 2022-08-27 DIAGNOSIS — E119 Type 2 diabetes mellitus without complications: Secondary | ICD-10-CM

## 2022-08-27 DIAGNOSIS — M2142 Flat foot [pes planus] (acquired), left foot: Secondary | ICD-10-CM | POA: Diagnosis not present

## 2022-08-27 DIAGNOSIS — M2141 Flat foot [pes planus] (acquired), right foot: Secondary | ICD-10-CM

## 2022-08-27 NOTE — Progress Notes (Signed)

## 2022-09-03 ENCOUNTER — Ambulatory Visit (INDEPENDENT_AMBULATORY_CARE_PROVIDER_SITE_OTHER): Payer: 59 | Admitting: Internal Medicine

## 2022-09-03 ENCOUNTER — Encounter: Payer: Self-pay | Admitting: Internal Medicine

## 2022-09-03 VITALS — BP 122/76 | HR 55 | Ht 61.0 in | Wt 148.6 lb

## 2022-09-03 DIAGNOSIS — E1142 Type 2 diabetes mellitus with diabetic polyneuropathy: Secondary | ICD-10-CM

## 2022-09-03 DIAGNOSIS — E1165 Type 2 diabetes mellitus with hyperglycemia: Secondary | ICD-10-CM | POA: Diagnosis not present

## 2022-09-03 LAB — POCT GLYCOSYLATED HEMOGLOBIN (HGB A1C): Hemoglobin A1C: 6.7 % — AB (ref 4.0–5.6)

## 2022-09-03 LAB — POCT GLUCOSE (DEVICE FOR HOME USE): POC Glucose: 125 mg/dl — AB (ref 70–99)

## 2022-09-03 MED ORDER — ACCU-CHEK GUIDE VI STRP: 1.0000 | ORAL_STRIP | Freq: Every day | 3 refills | Status: DC

## 2022-09-03 MED ORDER — ACCU-CHEK GUIDE ME W/DEVICE KIT: 1.0000 | PACK | Freq: Every day | 0 refills | Status: AC

## 2022-09-03 NOTE — Patient Instructions (Addendum)
Continue Metformin daily  for now     Check sugar at home daily ( if possible ) in the morning before eating   If sugars are consistently over 150 , please contact our office so I can start you on a another medicine to replace the Jardiance    HOW TO TREAT LOW BLOOD SUGARS (Blood sugar LESS THAN 70 MG/DL) Please follow the RULE OF 15 for the treatment of hypoglycemia treatment (when your (blood sugars are less than 70 mg/dL)   STEP 1: Take 15 grams of carbohydrates when your blood sugar is low, which includes:  3-4 GLUCOSE TABS  OR 3-4 OZ OF JUICE OR REGULAR SODA OR ONE TUBE OF GLUCOSE GEL    STEP 2: RECHECK blood sugar in 15 MINUTES STEP 3: If your blood sugar is still low at the 15 minute recheck --> then, go back to STEP 1 and treat AGAIN with another 15 grams of carbohydrates.

## 2022-09-03 NOTE — Progress Notes (Signed)
Name: [name]  MRN/ DOB: [id number], 1951-07-18   Age/ Sex: 71 y.o., female    PCP: Orvis Brill, DO   Reason for Endocrinology Evaluation: Type 2 Diabetes Mellitus     Date of Initial Endocrinology Visit: 09/03/2022     PATIENT IDENTIFIER: [name] is a 71 y.o. female with a past medical history of DM, HTN, COPD and dyslipidemia. The patient presented for initial endocrinology clinic visit on 09/03/2022 for consultative assistance with her diabetes management.    HPI: [name] was    Diagnosed with DM years ago  Prior Medications tried/Intolerance: Stopped Jaridnace 05/2022 due to weight loss  Currently checking blood sugars 0 x / day Hypoglycemia episodes : unknown           Hemoglobin A1c has ranged from 6.2% in 2020, peaking at 10.6% in 2023.   In terms of diet, the patient eats 1 meal a day and does not snack   Denies nausea, vomiting or diarrhea   She follows with [facility]  Has skin rash intermittently    HOME DIABETES REGIMEN: Metformin 1000 mg BID Jardiance 25 mg daily - not taking    Statin: yes ACE-I/ARB: yes   METER DOWNLOAD SUMMARY: Does not check    DIABETIC COMPLICATIONS: Microvascular complications:  Neuropathy Denies: CKD, retinopathy Last eye exam: Completed 05/2022  Macrovascular complications:   Denies: CAD, PVD, CVA   PAST HISTORY: Past Medical History:  Past Medical History:  Diagnosis Date   Adenomatous colon polyp    Allergy    Allergy to environmental factors 02/09/2020   Anxiety    Asthma    Back pain with radiation    radiopathy    Bilateral carpal tunnel syndrome 10/27/2010   Qualifier: Diagnosis of  By: Moshe Cipro MD, [name]     Cataract    FORMING    Cellulitis of left upper extremity 02/09/2020   COPD (chronic obstructive pulmonary disease) ([place])    Corneal abrasion, left 04/28/2013   Denture sore mouth 08/29/2021   Patient reports that she cannot wear her top dentures due to  soreness in her mouth. She reports that this has been going on since her upper teeth were extracted. She requests referral to oral surgeon.   Depression    Diabetes mellitus without complication ([place])    ON METFORMIN    Dizziness 09/07/2018   GERD (gastroesophageal reflux disease)    Glaucoma    History of tobacco abuse 08/18/2008   HSV-2 (herpes simplex virus 2) infection 02/29/2016   Dx 2014, + serology   Hx of migraines    Hyperlipidemia    Hyperlipidemia associated with type 2 diabetes mellitus ([place]) 05/17/2012   Hypertension    on meds   Itching in the vaginal area 05/17/2015   Nicotine addiction    PANIC ATTACK 11/05/2007   PELVIC  PAIN 08/13/2010   Plantar wart of right foot 09/14/2019   Snoring 08/29/2021   Type 2 diabetes mellitus with hyperglycemia ([place]) 11/22/2021   Urinary incontinence    Well controlled type 2 diabetes mellitus ([place]) 02/24/2018   Past Surgical History:  Past Surgical History:  Procedure Laterality Date   ABDOMINAL HYSTERECTOMY     bleeding ulcer repair  1992   Open Ex Lap with perforation of duodenal ulcer with pertitonitis   [place]   left ankle surgery  2008   POLYPECTOMY  removal of mild ducts from left breast  1982   SIMPLE MASTECTOMY Bilateral 06/04/2010   TOTAL ABDOMINAL HYSTERECTOMY W/ BILATERAL SALPINGOOPHORECTOMY  [place], 1978   x 2    Social History:  reports that she quit smoking about 5 years ago. Her smoking use included cigarettes. She has a 10.50 pack-year smoking history. She has never used smokeless tobacco. She reports that she does not drink alcohol and does not use drugs. Family History:  Family History  Problem Relation Age of Onset   Heart disease Mother    Diabetes Mother    Lupus Mother    Colon polyps Mother    Tuberculosis Father    Depression Sister    Diabetes Brother    Colon polyps Maternal Aunt    Heart disease Maternal  Grandmother    Heart attack Maternal Aunt    Stomach cancer Sister    Endometriosis Daughter    Diabetes Daughter    Colon cancer Neg Hx    Esophageal cancer Neg Hx    Pancreatic cancer Neg Hx    Rectal cancer Neg Hx      HOME MEDICATIONS: Allergies as of 09/03/2022       Reactions   Aspirin Other (See Comments)   Dr told pt not to take asa d/t bleeding ulcer in 1985.   Penicillins Nausea Only   Tablets only.  Denies any airway involvement.     Latex Rash   Relieved with over the counter medications.        Medication List        Accurate as of September 03, 2022  2:37 PM. If you have any questions, ask your nurse or doctor.          acetaminophen 500 MG tablet Commonly known as: TYLENOL Take 1,000 mg by mouth every 8 (eight) hours as needed for moderate pain.   alendronate 70 MG tablet Commonly known as: FOSAMAX TAKE ONE TABLET BY MOUTH EVERY 7 DAYS with a full glass of water and on an empty stomach   amLODipine 10 MG tablet Commonly known as: NORVASC TAKE 1 Tablet BY MOUTH ONCE DAILY at bedtime   atorvastatin 80 MG tablet Commonly known as: LIPITOR TAKE 1 TABLET(80 MG) BY MOUTH DAILY   Blood Pressure Kit Devi Please measure BP at same time each day   buPROPion 300 MG 24 hr tablet Commonly known as: Wellbutrin XL Take 1 tablet (300 mg total) by mouth every morning.   clobetasol ointment 0.05 % Commonly known as: TEMOVATE APPLY TOPICALLY TO THE AFFECTED AREA TWICE DAILY   cloNIDine 0.2 MG tablet Commonly known as: Catapres Take 1 tablet (0.2 mg total) by mouth at bedtime.   clotrimazole 1 % cream Commonly known as: LOTRIMIN Apply 1 application. topically 2 (two) times daily.   empagliflozin 25 MG Tabs tablet Commonly known as: JARDIANCE Take 1 tablet (25 mg total) by mouth daily.   erythromycin ophthalmic ointment Place a 1/2 inch ribbon of ointment into the lower eyelid.   ezetimibe 10 MG tablet Commonly known as: ZETIA Take 1 tablet (10  mg total) by mouth daily.   famotidine 20 MG tablet Commonly known as: PEPCID TAKE ONE TABLET BY MOUTH BY MOUTH DAILY   fluticasone 50 MCG/ACT nasal spray Commonly known as: FLONASE Place into the nose.   gabapentin 100 MG capsule Commonly known as: NEURONTIN Take 1 capsule (100 mg total) by mouth at bedtime.   hydrochlorothiazide 12.5 MG tablet  Commonly known as: HYDRODIURIL Take 1 tablet (12.5 mg total) by mouth daily.   latanoprost 0.005 % ophthalmic solution Commonly known as: XALATAN Place 1 drop into both eyes at bedtime.   linaclotide 145 MCG Caps capsule Commonly known as: LINZESS Take 1 capsule (145 mcg total) by mouth daily before breakfast.   loratadine 10 MG dissolvable tablet Commonly known as: CLARITIN REDITABS Take 1 tablet (10 mg total) by mouth daily. As needed for allergy symptoms   losartan 50 MG tablet Commonly known as: COZAAR TAKE 1 TABLET(50 MG) BY MOUTH DAILY   metFORMIN 1000 MG (MOD) 24 hr tablet Commonly known as: GLUMETZA Take 1 tablet (1,000 mg total) by mouth 2 (two) times daily. Take 1 tablet two times a day with meals   mirtazapine 30 MG tablet Commonly known as: REMERON TAKE 1 TABLET(30 MG) BY MOUTH AT BEDTIME   propranolol 40 MG tablet Commonly known as: INDERAL Take 1 tablet (40 mg total) by mouth 2 (two) times daily.   spironolactone 25 MG tablet Commonly known as: ALDACTONE Take 1 tablet (25 mg total) by mouth at bedtime.         ALLERGIES: Allergies  Allergen Reactions   Aspirin Other (See Comments)    Dr told pt not to take asa d/t bleeding ulcer in 1985.   Penicillins Nausea Only    Tablets only.  Denies any airway involvement.     Latex Rash    Relieved with over the counter medications.     REVIEW OF SYSTEMS: A comprehensive ROS was conducted with the patient and is negative except as per HPI   OBJECTIVE:   VITAL SIGNS: BP 122/76 (BP Location: Left Arm, Patient Position: Sitting, Cuff Size: Large)    Pulse (!) 55   Ht _0  (1.549 m)   Wt 148 lb 9.6 oz (67.4 kg)   SpO2 99%   BMI 28.08 kg/m    PHYSICAL EXAM:  General: Pt appears well and is in NAD  Neck: General: Supple without adenopathy or carotid bruits. Thyroid: Thyroid size normal.  No goiter or nodules appreciated.   Lungs: Clear with good BS bilat with no rales, rhonchi, or wheezes  Heart: RRR   Abdomen:  soft, nontender  Extremities:  Lower extremities - No pretibial edema. No lesions.  Neuro: MS is good with appropriate affect, pt is alert and Ox3    DATA REVIEWED:  Lab Results  Component Value Date   HGBA1C 8.9 (A) 02/25/2022   HGBA1C 10.6 (A) 11/20/2021   HGBA1C 8.9 (A) 07/18/2021    Latest Reference Range & Units 01/21/22 16:17  Sodium 134 - 144 mmol/L 141  Potassium 3.5 - 5.2 mmol/L 5.0  Chloride 96 - 106 mmol/L 104  CO2 20 - 29 mmol/L 21  Glucose 70 - 99 mg/dL 199 (H)  BUN 8 - 27 mg/dL 13  Creatinine 0.57 - 1.00 mg/dL 1.31 (H)  Calcium 8.7 - 10.3 mg/dL 9.8  BUN/Creatinine Ratio 12 - 28  10 (L)  eGFR >59 mL/min/1.73 44 (L)    Latest Reference Range & Units 01/21/22 16:17  Cholesterol, Total 100 - 199 mg/dL 145  HDL Cholesterol >39 mg/dL 37 (L)  Triglycerides 0 - 149 mg/dL 134  VLDL Cholesterol Cal 5 - 40 mg/dL 24  LDL Chol Calc (NIH) 0 - 99 mg/dL 84     Old records , labs and images have been reviewed.   ASSESSMENT / PLAN / RECOMMENDATIONS:   1) Type 2 Diabetes Mellitus, Optimally controlled,  With Neuropathic complications - Most recent A1c of 6.7 %. Goal A1c < 7.0 %.    Plan: GENERAL: I have discussed with the patient the pathophysiology of diabetes. I explained the complications associated with diabetes including retinopathy, nephropathy, neuropathy as well as increased risk of cardiovascular disease. We went over the benefit seen with glycemic control.   I explained to the patient that diabetic patients are at higher than normal risk for amputations.  The patient was on Jardiance but she  stopped it 3 months ago due to weight loss She is NOT interested in any weight loss medications Her A1c is optimal at 6.7%, her last GFR was low at 44, we discussed checking her renal function today and deciding on the dose of metformin In the meantime she was advised to check her glucose daily if possible at home, a prescription for Accu-Chek guide was sent to her pharmacy she was also trained by my CMA on how to check glucose at home The patient was advised to contact our office if her BG >150 mg/dL and I will start her on sulfonylurea BMP continues to show a GFR <45, will reduce metformin  MEDICATIONS: Decrease metformin 1000 mg 1 tablet daily  EDUCATION / INSTRUCTIONS: BG monitoring instructions: Patient is instructed to check her blood sugars 1 times a day, fasting. Call [place] [facility] if: BG persistently < 70  I reviewed the Rule of 15 for the treatment of hypoglycemia in detail with the patient. Literature supplied.   2) Diabetic complications:  Eye: Does not have known diabetic retinopathy.  Neuro/ Feet: Does  have known diabetic peripheral neuropathy. Renal: Patient does not have known baseline CKD. She is  on an ACEI/ARB at present.      Signed electronically by: Mack Guise, MD  [facility]  [facility] [address], [place], [place] [address] Phone: [phone] FAX: [phone]   CC: Orvis Brill, [place] [address] Phone: [phone]  Fax: [phone]    Return to Endocrinology clinic as below: Future Appointments  Date Time Provider [place]  09/03/2022  2:40 PM Sadee Osland, Melanie Crazier, MD [id number] None  10/08/2022  2:00 PM Arfeen, Arlyce Harman, MD [facility] None

## 2022-09-04 LAB — BASIC METABOLIC PANEL
BUN: 9 mg/dL (ref 6–23)
CO2: 24 mEq/L (ref 19–32)
Calcium: 9.7 mg/dL (ref 8.4–10.5)
Chloride: 108 mEq/L (ref 96–112)
Creatinine, Ser: 1.22 mg/dL — ABNORMAL HIGH (ref 0.40–1.20)
GFR: 44.39 mL/min — ABNORMAL LOW (ref >60.00)
Glucose, Bld: 116 mg/dL — ABNORMAL HIGH (ref 70–99)
Potassium: 4.5 mEq/L (ref 3.5–5.1)
Sodium: 140 mEq/L (ref 135–145)

## 2022-09-05 MED ORDER — METFORMIN HCL 1000 MG PO TABS: 1000.0000 mg | ORAL_TABLET | Freq: Every day | ORAL | 3 refills | Status: DC

## 2022-09-22 ENCOUNTER — Other Ambulatory Visit: Payer: Self-pay

## 2022-09-22 DIAGNOSIS — I10 Essential (primary) hypertension: Secondary | ICD-10-CM

## 2022-09-22 MED ORDER — GABAPENTIN 100 MG PO CAPS: 100.0000 mg | ORAL_CAPSULE | Freq: Every day | ORAL | 2 refills | Status: DC

## 2022-09-22 MED ORDER — LOSARTAN POTASSIUM 50 MG PO TABS: ORAL_TABLET | ORAL | 2 refills | Status: DC

## 2022-09-24 ENCOUNTER — Other Ambulatory Visit: Payer: Self-pay

## 2022-09-24 DIAGNOSIS — K5900 Constipation, unspecified: Secondary | ICD-10-CM

## 2022-09-24 MED ORDER — LINACLOTIDE 145 MCG PO CAPS: 145.0000 ug | ORAL_CAPSULE | Freq: Every day | ORAL | 11 refills | Status: DC

## 2022-09-26 ENCOUNTER — Other Ambulatory Visit: Payer: Self-pay | Admitting: Internal Medicine

## 2022-10-08 ENCOUNTER — Other Ambulatory Visit (HOSPITAL_COMMUNITY): Payer: Self-pay | Admitting: Psychiatry

## 2022-10-08 ENCOUNTER — Encounter (HOSPITAL_COMMUNITY): Payer: Self-pay | Admitting: Psychiatry

## 2022-10-08 ENCOUNTER — Telehealth (HOSPITAL_BASED_OUTPATIENT_CLINIC_OR_DEPARTMENT_OTHER): Payer: 59 | Admitting: Psychiatry

## 2022-10-08 VITALS — Wt 148.0 lb

## 2022-10-08 DIAGNOSIS — F330 Major depressive disorder, recurrent, mild: Secondary | ICD-10-CM | POA: Diagnosis not present

## 2022-10-08 DIAGNOSIS — F419 Anxiety disorder, unspecified: Secondary | ICD-10-CM

## 2022-10-08 MED ORDER — MIRTAZAPINE 30 MG PO TABS: ORAL_TABLET | ORAL | 0 refills | Status: DC

## 2022-10-08 MED ORDER — GABAPENTIN 100 MG PO CAPS: 100.0000 mg | ORAL_CAPSULE | Freq: Two times a day (BID) | ORAL | 0 refills | Status: DC

## 2022-10-08 MED ORDER — BUPROPION HCL ER (XL) 300 MG PO TB24: 300.0000 mg | ORAL_TABLET | ORAL | 0 refills | Status: DC

## 2022-10-08 NOTE — Progress Notes (Signed)
Virtual Visit via Telephone Note  I connected with [name] on 10/08/22 at  2:00 PM EST by telephone and verified that I am speaking with the correct person using two identifiers.  Location: Patient: Home Provider: Home Office   I discussed the limitations, risks, security and privacy concerns of performing an evaluation and management service by telephone and the availability of in person appointments. I also discussed with the patient that there may be a patient responsible charge related to this service. The patient expressed understanding and agreed to proceed.   History of Present Illness: Patient is evaluated by phone session.  She reported increased anxiety, depression and having social isolation recently.  She reported multiple stressors.  The place she is living has a lot of roaches and she is not happy.  She had complained to the landlord and they did try spraying every Thursday but only worked for 2 days.  She is frustrated because her grandkids cannot come to visit her.  She also reported one of her cousin died of a sudden as he was found in the house and no more details.  One of her other cousin diagnosed with cancer and recently her aunt who lives in [place] diagnosed with dementia.  She feels sad and dysphoria especially around holidays.  She reported sometimes having panic attack and poor sleep.  She denies suicidal thoughts but reported decreased energy, motivation to do things.  Recently she has visited the emergency room because of issues in her eyes.  She also saw her endocrinologist and her hemoglobin A1c improved from the past.  She reported her appetite and weight is also improved from the past.  She denies any hallucination or any paranoia.  Her daughter lives close but lately not able to come as frequently because she injured her leg.  Patient told her son-in-law and daughter trying to find a new place for her because she cannot stay there due to the roaches.   Patient uses transportation which is provided by Medicaid.  She is currently not seeing any therapist but when I offered to see the therapist she agreed to consider.  She has no tremors, shakes or any EPS.  She denies drinking or using any illegal substances.  He is compliant with mirtazapine and Wellbutrin.  She also taking gabapentin prescribed by her PCP for leg pain.   Past Psychiatric History: Reviewed. H/O inpatient treatment at [facility] to detox from Xanax.  Saw Psychiatrist in [place], [place] and [facility].  Tried Paxil, Prozac, vistaril and Zoloft.  No h/o suicidal attempt or any psychosis.  Recent Results (from the past 2160 hour(s))  POCT Glucose (Device for Home Use)     Status: Abnormal   Collection Time: 09/03/22  2:41 PM  Result Value Ref Range   Glucose Fasting, POC     POC Glucose 125 (A) 70 - 99 mg/dl  POCT glycosylated hemoglobin (Hb A1C)     Status: Abnormal   Collection Time: 09/03/22  2:41 PM  Result Value Ref Range   Hemoglobin A1C 6.7 (A) 4.0 - 5.6 %   HbA1c POC (<> result, manual entry)     HbA1c, POC (prediabetic range)     HbA1c, POC (controlled diabetic range)    Basic metabolic panel     Status: Abnormal   Collection Time: 09/03/22  3:02 PM  Result Value Ref Range   Sodium 140 135 - 145 mEq/L   Potassium 4.5 3.5 - 5.1 mEq/L  Chloride 108 96 - 112 mEq/L   CO2 24 19 - 32 mEq/L   Glucose, Bld 116 (H) 70 - 99 mg/dL   BUN 9 6 - 23 mg/dL   Creatinine, Ser 1.22 (H) 0.40 - 1.20 mg/dL   GFR 44.39 (L) >60.00 mL/min    Comment: Calculated using the CKD-EPI Creatinine Equation (2021)   Calcium 9.7 8.4 - 10.5 mg/dL     Psychiatric Specialty Exam: Physical Exam  Review of Systems  Weight 148 lb (67.1 kg).There is no height or weight on file to calculate BMI.  General Appearance: NA  Eye Contact:  NA  Speech:  Slow  Volume:  Decreased  Mood:  Dysphoric  Affect:  NA  Thought Process:  Goal Directed  Orientation:  Full  (Time, Place, and Person)  Thought Content:  Rumination  Suicidal Thoughts:  No  Homicidal Thoughts:  No  Memory:  Immediate;   Good Recent;   Good Remote;   Good  Judgement:  Good  Insight:  NA and Present  Psychomotor Activity:  NA  Concentration:  Concentration: Fair and Attention Span: Good  Recall:  Good  Fund of Knowledge:  Good  Language:  Good  Akathisia:  No  Handed:  Right  AIMS (if indicated):     Assets:  Communication Skills Desire for Improvement Housing Social Support  ADL's:  Intact  Cognition:  WNL  Sleep:   fair      Assessment and Plan: Major depressive disorder, recurrent.  Anxiety.  I reviewed notes.  Her appetite and weight is improved.  Her blood sugar improved and her last hemoglobin A1c 6.7 which is better than before.  Her creatinine is 1.22.  Physically overall she is doing better however she is stressed about routines in the house and family members were not doing well.  Aunt has dementia and cousin diagnosed with cancer and another cousin died all of a sudden.  I review her meds.  She is taking gabapentin to help her leg pain.  I explained the gabapentin can also help her sleep and anxiety and currently she is taking 100 mg at bedtime.  I recommend she could try taking 2 times a day or both pills together at bedtime.  She agreed to give a try.  I also offered to see a therapist and she agreed to consider.  We will provide referral information.  Continue mirtazapine 30 mg at bedtime, Wellbutrin XL 3 mg daily and she will try gabapentin 200 mg a day.  I recommend to call us back if she has any questions or any concerns.  Follow-up in 3 months.  She is hoping to find a new place soon.  Follow Up Instructions:    I discussed the assessment and treatment plan with the patient. The patient was provided an opportunity to ask questions and all were answered. The patient agreed with the plan and demonstrated an understanding of the instructions.   The patient  was advised to call back or seek an in-person evaluation if the symptoms worsen or if the condition fails to improve as anticipated.  Collaboration of Care: Other provider involved in patient's care AEB notes are available in epic to review.  Patient/Guardian was advised Release of Information must be obtained prior to any record release in order to collaborate their care with an outside provider. Patient/Guardian was advised if they have not already done so to contact the registration department to sign all necessary forms in order for [place] to release  information regarding their care.   Consent: Patient/Guardian gives verbal consent for treatment and assignment of benefits for services provided during this visit. Patient/Guardian expressed understanding and agreed to proceed.    I provided 22 minutes of non-face-to-face time during this encounter.   Kathlee Nations, MD

## 2022-10-23 ENCOUNTER — Other Ambulatory Visit: Payer: Self-pay | Admitting: Student

## 2022-10-23 ENCOUNTER — Other Ambulatory Visit: Payer: Self-pay

## 2022-10-23 DIAGNOSIS — I10 Essential (primary) hypertension: Secondary | ICD-10-CM

## 2022-10-23 MED ORDER — PROPRANOLOL HCL 40 MG PO TABS: 40.0000 mg | ORAL_TABLET | Freq: Two times a day (BID) | ORAL | 2 refills | Status: DC

## 2022-10-23 MED ORDER — HYDROCHLOROTHIAZIDE 12.5 MG PO TABS: 12.5000 mg | ORAL_TABLET | Freq: Every day | ORAL | 2 refills | Status: DC

## 2022-12-15 ENCOUNTER — Telehealth: Payer: Self-pay | Admitting: Student

## 2022-12-15 NOTE — Telephone Encounter (Signed)
Contacted [facility] to schedule their annual wellness visit. Appointment made for 12/22/2022.  Thank you,  [place] Direct dial  [phone]

## 2022-12-18 ENCOUNTER — Other Ambulatory Visit (HOSPITAL_COMMUNITY): Payer: Self-pay | Admitting: Psychiatry

## 2022-12-18 DIAGNOSIS — F330 Major depressive disorder, recurrent, mild: Secondary | ICD-10-CM

## 2022-12-18 DIAGNOSIS — F419 Anxiety disorder, unspecified: Secondary | ICD-10-CM

## 2022-12-21 NOTE — Progress Notes (Unsigned)
I connected with  [name] on 12/22/2022 by a audio enabled telemedicine application and verified that I am speaking with the correct person using two identifiers.  Patient Location: Home  Provider Location: Home Office  I discussed the limitations of evaluation and management by telemedicine. The patient expressed understanding and agreed to proceed.   Subjective:   [name] is a 72 y.o. female who presents for Medicare Annual (Subsequent) preventive examination.  Review of Systems    Per HPI unless specifically indicated below.  Cardiac Risk Factors include: advanced age (>93mn, >>30women);female gender, Hypertension, PAD, and Hyperlipidemia.           Objective:       10/08/2022    1:58 PM 09/03/2022    2:32 PM 08/08/2022    6:23 AM  Vitals with BMI  Height  '5\' 1"'$    Weight  148 lbs 10 oz   BMI  [id number]  Systolic  [id number] Diastolic  76 89  Pulse  55 57     Information is confidential and restricted. Go to Review Flowsheets to unlock data.    There were no vitals filed for this visit. There is no height or weight on file to calculate BMI.     08/07/2022    8:58 PM 04/08/2022    2:36 PM 03/04/2022   10:51 AM 02/25/2022    2:18 PM 01/21/2022    3:09 PM 11/20/2021    4:03 PM 08/29/2021    1:56 PM  Advanced Directives  Does Patient Have a Medical Advance Directive? No No No No No No No  Would patient like information on creating a medical advance directive?  No - Patient declined No - Patient declined  No - Patient declined No - Patient declined Yes (MAU/Ambulatory/Procedural Areas - Information given)    Current Medications (verified) Outpatient Encounter Medications as of 12/22/2022  Medication Sig   acetaminophen (TYLENOL) 500 MG tablet Take 1,000 mg by mouth every 8 (eight) hours as needed for moderate pain.   alendronate (FOSAMAX) 70 MG tablet TAKE ONE TABLET BY MOUTH EVERY 7 DAYS with a full glass of water and on an empty stomach    amLODipine (NORVASC) 10 MG tablet TAKE 1 Tablet BY MOUTH ONCE DAILY at bedtime   atorvastatin (LIPITOR) 80 MG tablet TAKE 1 TABLET(80 MG) BY MOUTH DAILY   Blood Glucose Monitoring Suppl (ACCU-CHEK GUIDE ME) w/Device KIT 1 Device by Does not apply route daily in the afternoon.   Blood Pressure Monitoring (BLOOD PRESSURE KIT) DEVI Please measure BP at same time each day   buPROPion (WELLBUTRIN XL) 300 MG 24 hr tablet Take 1 tablet (300 mg total) by mouth every morning.   clobetasol ointment (TEMOVATE) 0.05 % APPLY TOPICALLY TO THE AFFECTED AREA TWICE DAILY   cloNIDine (CATAPRES) 0.2 MG tablet Take 1 tablet (0.2 mg total) by mouth at bedtime.   clotrimazole (LOTRIMIN) 1 % cream Apply 1 application. topically 2 (two) times daily.   Easy Comfort Lancets MISC Use as directed to check blood sugar IN THE EVENING   erythromycin ophthalmic ointment Place a 1/2 inch ribbon of ointment into the lower eyelid.   ezetimibe (ZETIA) 10 MG tablet Take 1 tablet (10 mg total) by mouth daily.   famotidine (PEPCID) 20 MG tablet TAKE ONE TABLET BY MOUTH BY MOUTH DAILY   fluticasone (FLONASE) 50 MCG/ACT nasal spray Place into the nose.   gabapentin (NEURONTIN) 100 MG capsule Take 1 capsule (100 mg  total) by mouth 2 (two) times daily.   glucose blood (ACCU-CHEK GUIDE) test strip 1 each by Other route daily in the afternoon. Use as instructed   hydrochlorothiazide (HYDRODIURIL) 12.5 MG tablet Take 1 tablet (12.5 mg total) by mouth daily.   latanoprost (XALATAN) 0.005 % ophthalmic solution Place 1 drop into both eyes at bedtime.   linaclotide (LINZESS) 145 MCG CAPS capsule Take 1 capsule (145 mcg total) by mouth daily before breakfast.   loratadine (CLARITIN REDITABS) 10 MG dissolvable tablet Take 1 tablet (10 mg total) by mouth daily. As needed for allergy symptoms   losartan (COZAAR) 50 MG tablet TAKE 1 TABLET(50 MG) BY MOUTH DAILY   metFORMIN (GLUCOPHAGE) 1000 MG tablet Take 1 tablet (1,000 mg total) by mouth daily with  breakfast.   mirtazapine (REMERON) 30 MG tablet TAKE 1 TABLET(30 MG) BY MOUTH AT BEDTIME   propranolol (INDERAL) 40 MG tablet Take 1 tablet (40 mg total) by mouth 2 (two) times daily.   spironolactone (ALDACTONE) 25 MG tablet Take 1 tablet (25 mg total) by mouth at bedtime.   No facility-administered encounter medications on file as of 12/22/2022.    Allergies (verified) Aspirin, Penicillins, and Latex   History: Past Medical History:  Diagnosis Date   Adenomatous colon polyp    Allergy    Allergy to environmental factors 02/09/2020   Anxiety    Asthma    Back pain with radiation    radiopathy    Bilateral carpal tunnel syndrome 10/27/2010   Qualifier: Diagnosis of  By: Moshe Cipro MD, [name]     Cataract    FORMING    Cellulitis of left upper extremity 02/09/2020   COPD (chronic obstructive pulmonary disease) ([place])    Corneal abrasion, left 04/28/2013   Denture sore mouth 08/29/2021   Patient reports that she cannot wear her top dentures due to soreness in her mouth. She reports that this has been going on since her upper teeth were extracted. She requests referral to oral surgeon.   Depression    Diabetes mellitus without complication ([place])    ON METFORMIN    Dizziness 09/07/2018   GERD (gastroesophageal reflux disease)    Glaucoma    History of tobacco abuse 08/18/2008   HSV-2 (herpes simplex virus 2) infection 02/29/2016   Dx 2014, + serology   Hx of migraines    Hyperlipidemia    Hyperlipidemia associated with type 2 diabetes mellitus ([place]) 05/17/2012   Hypertension    on meds   Itching in the vaginal area 05/17/2015   Nicotine addiction    PANIC ATTACK 11/05/2007   PELVIC  PAIN 08/13/2010   Plantar wart of right foot 09/14/2019   Snoring 08/29/2021   Type 2 diabetes mellitus with hyperglycemia ([place]) 11/22/2021   Urinary incontinence    Well controlled type 2 diabetes mellitus ([place]) 02/24/2018   Past Surgical History:  Procedure Laterality Date   ABDOMINAL HYSTERECTOMY      bleeding ulcer repair  1992   Open Ex Lap with perforation of duodenal ulcer with pertitonitis   [place]   left ankle surgery  2008   POLYPECTOMY     removal of mild ducts from left breast  1982   SIMPLE MASTECTOMY Bilateral 06/04/2010   TOTAL ABDOMINAL HYSTERECTOMY W/ [place]   x 2   Family History  Problem Relation Age of Onset  Heart disease Mother    Diabetes Mother    Lupus Mother    Colon polyps Mother    Tuberculosis Father    Depression Sister    Diabetes Brother    Colon polyps Maternal Aunt    Heart disease Maternal Grandmother    Heart attack Maternal Aunt    Stomach cancer Sister    Endometriosis Daughter    Diabetes Daughter    Colon cancer Neg Hx    Esophageal cancer Neg Hx    Pancreatic cancer Neg Hx    Rectal cancer Neg Hx    Social History   Socioeconomic History   Marital status: Legally Separated    Spouse name: Not on file   Number of children: 3   Years of education: 12   Highest education level: High school graduate  Occupational History   Occupation: retired   Occupation: Retired  Tobacco Use   Smoking status: Former    Packs/day: 0.25    Years: 42.00    Total pack years: 10.50    Types: Cigarettes    Quit date: 07/06/2017    Years since quitting: 5.4   Smokeless tobacco: Never  Vaping Use   Vaping Use: Never used  Substance and Sexual Activity   Alcohol use: No    Alcohol/week: 0.0 standard drinks of alcohol   Drug use: No   Sexual activity: Not Currently    Birth control/protection: Post-menopausal  Other Topics Concern   Not on file  Social History Narrative   Patient lives alone in [place].    Patient uses public transportation.    Patient enjoys reading and watching detective shows.    Social Determinants of Health   Financial Resource Strain: Low Risk  (07/04/2021)   Overall Financial Resource Strain  (CARDIA)    Difficulty of Paying Living Expenses: Not hard at all  Food Insecurity: No Food Insecurity (07/04/2021)   Hunger Vital Sign    Worried About Running Out of Food in the Last Year: Never true    Ran Out of Food in the Last Year: Never true  Transportation Needs: Unmet Transportation Needs (07/29/2021)   PRAPARE - Transportation    Lack of Transportation (Medical): No    Lack of Transportation (Non-Medical): Yes  Physical Activity: Inactive (07/04/2021)   Exercise Vital Sign    Days of Exercise per Week: 0 days    Minutes of Exercise per Session: 0 min  Stress: No Stress Concern Present (07/04/2021)   [place]    Feeling of Stress : Only a little  Social Connections: Socially Isolated (07/04/2021)   Social Connection and Isolation Panel [NHANES]    Frequency of Communication with Friends and Family: More than three times a week    Frequency of Social Gatherings with Friends and Family: More than three times a week    Attends Religious Services: Never    Marine scientist or Organizations: No    Attends Archivist Meetings: Never    Marital Status: Separated    Tobacco Counseling Counseling given: Not Answered   Clinical Intake:                 Diabetic?Nutrition Risk Assessment:  Has the patient had any N/V/D within the last 2 months?  {YES/NO:21197} Does the patient have any non-healing wounds?  {YES/NO:21197} Has the patient had any unintentional weight loss or weight gain?  {YES/NO:21197}  Diabetes:  Is the patient diabetic?  {  YES/NO:21197} If diabetic, was a CBG obtained today?  {YES/NO:21197} Did the patient bring in their glucometer from home?  {YES/NO:21197} How often do you monitor your CBG's? ***.   Financial Strains and Diabetes Management:  Are you having any financial strains with the device, your supplies or your medication? {YES/NO:21197}.  Does the patient want  to be seen by Chronic Care Management for management of their diabetes?  {YES/NO:21197} Would the patient like to be referred to a Nutritionist or for Diabetic Management?  {YES/NO:21197}  Diabetic Exams:  Diabetic Eye Exam: Overdue for diabetic eye exam. Pt has been advised about the importance in completing this exam. Patient advised to call and schedule an eye exam. Diabetic Foot Exam: Overdue, Pt has been advised about the importance in completing this exam. Pt is scheduled for diabetic foot exam on next diabetic exam.          Activities of Daily Living     No data to display          Patient Care Team: Orvis Brill, DO as PCP - General (Family Medicine) Rutherford Guys, MD as Consulting Physician (Ophthalmology) Pyrtle, Lajuan Lines, MD as Consulting Physician (Gastroenterology) Adele Schilder, Arlyce Harman, MD as Consulting Physician (Psychiatry) Lazaro Arms, RN as [place] any recent [place] you may have received from other than [facility] providers in the past year (date may be approximate).     Assessment:   This is a routine wellness examination for [place].  Hearing/Vision screen Denies any hearing issues. Denies any change to her vision. Wear glasses. Annual Eye Exam.    Dietary issues and exercise activities discussed:     Goals Addressed   None    Depression Screen    04/08/2022    2:35 PM 03/04/2022   10:55 AM 01/21/2022    3:09 PM 08/29/2021    1:56 PM 07/18/2021    2:57 PM 07/04/2021    3:44 PM 03/26/2021    2:38 PM  PHQ 2/9 Scores  PHQ - 2 Score 2  0 6 2 0 0  PHQ- 9 Score '9  9 21 15  5  '$ Exception Documentation  Patient refusal         Fall Risk    04/08/2022    2:37 PM 03/04/2022   10:53 AM 01/21/2022    3:09 PM 08/29/2021    1:56 PM 07/29/2021    3:11 PM  [place] in the past year? '1 1 1 1 1  '$ Comment     2 weeks ago legs weak  Number falls in past yr: 1 0 0 1 0  Injury with Fall? 0 0 0 1 0  Risk for  fall due to :    Impaired balance/gait Impaired balance/gait  Follow up Falls evaluation completed    Falls evaluation completed;Education provided;Falls prevention discussed  Comment md informed        FALL RISK PREVENTION PERTAINING TO THE HOME:  Any stairs in or around the home? {YES/NO:21197} If so, are there any without handrails? {YES/NO:21197} Home free of loose throw rugs in walkways, pet beds, electrical cords, etc? {YES/NO:21197} Adequate lighting in your home to reduce risk of falls? {YES/NO:21197}  ASSISTIVE DEVICES UTILIZED TO PREVENT FALLS:  Life alert? {YES/NO:21197} Use of a cane, walker or w/c? {YES/NO:21197} Grab bars in the bathroom? {YES/NO:21197} Shower chair or bench in shower? {YES/NO:21197} Elevated toilet seat or a handicapped toilet? {YES/NO:21197}  TIMED UP AND GO:  Was the test performed? Unable to perform, virtual appointment   Cognitive Function:      08/29/2021    3:48 PM  Montreal Cognitive Assessment   Visuospatial/ Executive (0/5) 2  Naming (0/3) 2  Attention: Read list of digits (0/2) 2  Attention: Read list of letters (0/1) 1  Attention: Serial 7 subtraction starting at 100 (0/3) 0  Language: Repeat phrase (0/2) 2  Language : Fluency (0/1) 0  Abstraction (0/2) 2  Delayed Recall (0/5) 1  Orientation (0/6) 6  Total 18      07/04/2021    3:48 PM  6CIT Screen  What Year? 0 points  What month? 0 points  What time? 0 points  Count back from 20 0 points  Months in reverse 2 points  Repeat phrase 0 points  Total Score 2 points    Immunizations Immunization History  Administered Date(s) Administered   Fluad Quad(high Dose 65+) 07/18/2021   H1N1 09/28/2008   Influenza Split 09/13/2012   Influenza Whole 09/16/2005, 10/03/2007, 08/08/2009, 08/13/2010   Influenza, High Dose Seasonal PF 07/13/2017   Influenza,inj,Quad PF,6+ Mos 07/07/2013, 10/02/2014, 11/26/2015, 08/01/2016, 07/14/2018, 08/05/2019, 09/14/2020   Influenza-Unspecified  07/13/2017   PFIZER Comirnaty(Gray Top)Covid-19 Tri-Sucrose Vaccine 03/26/2021   PFIZER(Purple Top)SARS-COV-2 Vaccination 03/27/2020, 04/23/2020   Pfizer Covid-19 Vaccine Bivalent Booster 66yr & up 08/29/2021   Pneumococcal Conjugate-13 11/26/2015   Pneumococcal Polysaccharide-23 06/02/2004, 02/24/2018   Td 06/12/2004   Tdap 01/25/2021    TDAP status: Up to date  Flu Vaccine status: Due, Education has been provided regarding the importance of this vaccine. Advised may receive this vaccine at local pharmacy or Health Dept. Aware to provide a copy of the vaccination record if obtained from local pharmacy or Health Dept. Verbalized acceptance and understanding.  Pneumococcal vaccine status: Up to date  Covid-19 vaccine status: Information provided on how to obtain vaccines.   Qualifies for Shingles Vaccine? Yes   Zostavax completed No   Shingrix Completed?: No.    Education has been provided regarding the importance of this vaccine. Patient has been advised to call insurance company to determine out of pocket expense if they have not yet received this vaccine. Advised may also receive vaccine at local pharmacy or Health Dept. Verbalized acceptance and understanding.  Screening Tests Health Maintenance  Topic Date Due   Diabetic kidney evaluation - Urine ACR  Never done   Zoster Vaccines- Shingrix (1 of 2) Never done   COLONOSCOPY (Pts 45-452yrInsurance coverage will need to be confirmed)  08/08/2019   INFLUENZA VACCINE  05/27/2022   OPHTHALMOLOGY EXAM  06/11/2022   COVID-19 Vaccine (5 - 2023-24 season) 06/27/2022   Medicare Annual Wellness (AWV)  07/04/2022   FOOT EXAM  12/05/2022   HEMOGLOBIN A1C  03/04/2023   Diabetic kidney evaluation - eGFR measurement  09/04/2023   DTaP/Tdap/Td (3 - Td or Tdap) 01/26/2031   Pneumonia Vaccine 6550Years old  Completed   DEXA SCAN  Completed   Hepatitis C Screening  Completed   HPV VACCINES  Aged Out    Health Maintenance  Health  Maintenance Due  Topic Date Due   Diabetic kidney evaluation - Urine ACR  Never done   Zoster Vaccines- Shingrix (1 of 2) Never done   COLONOSCOPY (Pts 45-4922yrnsurance coverage will need to be confirmed)  08/08/2019   INFLUENZA VACCINE  05/27/2022   OPHTHALMOLOGY EXAM  06/11/2022   COVID-19 Vaccine (5 - 2023-24 season) 06/27/2022   Medicare Annual Wellness (AWV)  07/04/2022  FOOT EXAM  12/05/2022    Colon Cancer Screening: colonoscopy referral placed, [facility]   Mammogram status: No longer required due to age.  DEXA Scan: 06/30/2018  Lung Cancer Screening: (Low Dose CT Chest recommended if Age 21-80 years, 30 pack-year currently smoking OR have quit w/in 15years.) does not qualify.   Lung Cancer Screening Referral: not applicable  Additional Screening:  Hepatitis C Screening: does qualify; Completed 06/09/2012  Vision Screening: Recommended annual ophthalmology exams for early detection of glaucoma and other disorders of the eye. Is the patient up to date with their annual eye exam?  {YES/NO:21197} Who is the provider or what is the name of the office in which the patient attends annual eye exams? *** If pt is not established with a provider, would they like to be referred to a provider to establish care? {YES/NO:21197}.   Dental Screening: Recommended annual dental exams for proper oral hygiene  Community Resource Referral / Chronic Care Management: CRR required this visit?  No   CCM required this visit?  No      Plan:     I have personally reviewed and noted the following in the patient's chart:   Medical and social history Use of alcohol, tobacco or illicit drugs  Current medications and supplements including opioid prescriptions. Patient is not currently taking opioid prescriptions. Functional ability and status Nutritional status Physical activity Advanced directives List of other physicians Hospitalizations, surgeries, and ER visits in  previous 12 months Vitals Screenings to include cognitive, depression, and falls Referrals and appointments  In addition, I have reviewed and discussed with patient certain preventive protocols, quality metrics, and best practice recommendations. A written personalized care plan for preventive services as well as general preventive health recommendations were provided to patient.    Ms. Omans , Thank you for taking time to come for your Medicare Wellness Visit. I appreciate your ongoing commitment to your health goals. Please review the following plan we discussed and let me know if I can assist you in the future.   These are the goals we discussed:  Goals       Blood Pressure < 140/90      Client will have the abillity to take Medications within at the right time and right doasage with memory techniques (pt-stated)          Timeframe:  Long-Range Goal Priority:  High Start Date:   07/29/21                          Expected End Date:   10/25/21                     Patient Goals/Self-Care Activities: Patient will self administer medications as prescribed as evidenced by self report/primary caregiver report  Patient will attend all scheduled provider appointments as evidenced by clinician review of documented attendance to scheduled appointments and patient/caregiver report Patient will call pharmacy for medication refills as evidenced by patient report and review of pharmacy fill history as appropriate Patient will call provider office for new concerns or questions as evidenced by review of documented incoming telephone call notes and patient report Patient will talk with her daughter about helping her with setting up her pill box monthly       Effective Long-Term Care Planning      Patient Goals/Self-Care Activities : Over the next 30 days Review EMMI educational information on Advance Directive  Complete Advance Directive  packet,  Have advance directive notarized and provide  a copy to provider office Call LCSW if you have questions         HEMOGLOBIN A1C < 7      7.8 01/2021        This is a list of the screening recommended for you and due dates:  Health Maintenance  Topic Date Due   Yearly kidney health urinalysis for diabetes  Never done   Zoster (Shingles) Vaccine (1 of 2) Never done   Colon Cancer Screening  08/08/2019   Flu Shot  05/27/2022   Eye exam for diabetics  06/11/2022   COVID-19 Vaccine (5 - 2023-24 season) 06/27/2022   Complete foot exam   12/05/2022   Hemoglobin A1C  03/04/2023   Yearly kidney function blood test for diabetes  09/04/2023   Medicare Annual Wellness Visit  12/23/2023   DTaP/Tdap/Td vaccine (3 - Td or Tdap) 01/26/2031   Pneumonia Vaccine  Completed   DEXA scan (bone density measurement)  Completed   Hepatitis C Screening: USPSTF Recommendation to screen - Ages 59-79 yo.  Completed   HPV Vaccine  Aged [address] [name]   12/22/2022  Nurse Notes: Approximately 30 minute Non-Face -To-Face Medicare Wellness Visit

## 2022-12-21 NOTE — Patient Instructions (Signed)

## 2022-12-22 ENCOUNTER — Other Ambulatory Visit: Payer: Self-pay

## 2022-12-22 ENCOUNTER — Ambulatory Visit (INDEPENDENT_AMBULATORY_CARE_PROVIDER_SITE_OTHER): Payer: 59

## 2022-12-22 DIAGNOSIS — Z0000 Encounter for general adult medical examination without abnormal findings: Secondary | ICD-10-CM

## 2022-12-22 MED ORDER — FAMOTIDINE 20 MG PO TABS: ORAL_TABLET | ORAL | 2 refills | Status: DC

## 2022-12-22 MED ORDER — CLONIDINE HCL 0.2 MG PO TABS: 0.2000 mg | ORAL_TABLET | Freq: Every day | ORAL | 3 refills | Status: DC

## 2022-12-22 NOTE — Addendum Note (Signed)
Addended by: Londell Moh T on: 12/22/2022 08:25 AM   Modules accepted: Orders

## 2022-12-22 NOTE — Addendum Note (Signed)
Addended by: Orvis Brill on: 12/22/2022 09:04 AM   Modules accepted: Orders

## 2023-01-06 ENCOUNTER — Ambulatory Visit: Payer: 59 | Admitting: Internal Medicine

## 2023-01-07 ENCOUNTER — Encounter (HOSPITAL_COMMUNITY): Payer: Self-pay | Admitting: Psychiatry

## 2023-01-07 ENCOUNTER — Telehealth (HOSPITAL_BASED_OUTPATIENT_CLINIC_OR_DEPARTMENT_OTHER): Payer: 59 | Admitting: Psychiatry

## 2023-01-07 DIAGNOSIS — F419 Anxiety disorder, unspecified: Secondary | ICD-10-CM

## 2023-01-07 DIAGNOSIS — F330 Major depressive disorder, recurrent, mild: Secondary | ICD-10-CM | POA: Diagnosis not present

## 2023-01-07 MED ORDER — MIRTAZAPINE 30 MG PO TABS: ORAL_TABLET | ORAL | 0 refills | Status: DC

## 2023-01-07 MED ORDER — BUPROPION HCL ER (XL) 300 MG PO TB24: 300.0000 mg | ORAL_TABLET | ORAL | 0 refills | Status: DC

## 2023-01-07 NOTE — Progress Notes (Signed)
[facility] MD Virtual Progress Note   Patient Location: Home Provider Location: Home Office  I connect with patient by telephone and verified that I am speaking with correct person by using two identifiers. I discussed the limitations of evaluation and management by telemedicine and the availability of in person appointments. I also discussed with the patient that there may be a patient responsible charge related to this service. The patient expressed understanding and agreed to proceed.  [name] [id number] 72 y.o.  01/07/2023 2:33 PM  History of Present Illness:  Patient is evaluated by phone session.  She is doing better since her home situation get better.  Landlord took care of the roaches.  She is sleeping better.  Patient told 1 more cousin died since the last visit.  Patient told in past 4 months there are 3 deaths in the family.  She had a good support from her daughter and son-in-law who live close by.  Since taking the gabapentin 200 mg she noticed it helps sleep, anxiety.  She does not want to change the medication.  She has no tremors, shakes or any EPS.  She denies any hallucination, paranoia or any suicidal thoughts.  She denies any feeling of hopelessness.  She checks her blood sugar and had appointment coming up in April for physical and blood work.  Past Psychiatric History: H/O inpatient treatment at [facility] to detox from Xanax.  Saw Psychiatrist in [place], [place] and [facility].  Tried Paxil, Prozac, vistaril and Zoloft.  No h/o suicidal attempt or any psychosis.    Outpatient Encounter Medications as of 01/07/2023  Medication Sig   acetaminophen (TYLENOL) 500 MG tablet Take 1,000 mg by mouth every 8 (eight) hours as needed for moderate pain.   alendronate (FOSAMAX) 70 MG tablet TAKE ONE TABLET BY MOUTH EVERY 7 DAYS with a full glass of water and on an empty stomach   amLODipine (NORVASC) 10 MG tablet TAKE 1  Tablet BY MOUTH ONCE DAILY at bedtime   atorvastatin (LIPITOR) 80 MG tablet TAKE 1 TABLET(80 MG) BY MOUTH DAILY   Blood Glucose Monitoring Suppl (ACCU-CHEK GUIDE ME) w/Device KIT 1 Device by Does not apply route daily in the afternoon.   Blood Pressure Monitoring (BLOOD PRESSURE KIT) DEVI Please measure BP at same time each day   buPROPion (WELLBUTRIN XL) 300 MG 24 hr tablet Take 1 tablet (300 mg total) by mouth every morning.   clobetasol ointment (TEMOVATE) 0.05 % APPLY TOPICALLY TO THE AFFECTED AREA TWICE DAILY   cloNIDine (CATAPRES) 0.2 MG tablet Take 1 tablet (0.2 mg total) by mouth at bedtime.   clotrimazole (LOTRIMIN) 1 % cream Apply 1 application. topically 2 (two) times daily.   Easy Comfort Lancets MISC Use as directed to check blood sugar IN THE EVENING   erythromycin ophthalmic ointment Place a 1/2 inch ribbon of ointment into the lower eyelid.   ezetimibe (ZETIA) 10 MG tablet Take 1 tablet (10 mg total) by mouth daily.   famotidine (PEPCID) 20 MG tablet TAKE ONE TABLET BY MOUTH BY MOUTH DAILY   fluticasone (FLONASE) 50 MCG/ACT nasal spray Place into the nose.   gabapentin (NEURONTIN) 100 MG capsule Take 1 capsule (100 mg total) by mouth 2 (two) times daily.   glucose blood (ACCU-CHEK GUIDE) test strip 1 each by Other route daily in the afternoon. Use as instructed   hydrochlorothiazide (HYDRODIURIL) 12.5 MG tablet Take 1 tablet (12.5 mg total) by mouth daily.  latanoprost (XALATAN) 0.005 % ophthalmic solution Place 1 drop into both eyes at bedtime.   linaclotide (LINZESS) 145 MCG CAPS capsule Take 1 capsule (145 mcg total) by mouth daily before breakfast.   loratadine (CLARITIN REDITABS) 10 MG dissolvable tablet Take 1 tablet (10 mg total) by mouth daily. As needed for allergy symptoms   losartan (COZAAR) 50 MG tablet TAKE 1 TABLET(50 MG) BY MOUTH DAILY   metFORMIN (GLUCOPHAGE) 1000 MG tablet Take 1 tablet (1,000 mg total) by mouth daily with breakfast.   mirtazapine (REMERON) 30  MG tablet TAKE 1 TABLET(30 MG) BY MOUTH AT BEDTIME   propranolol (INDERAL) 40 MG tablet Take 1 tablet (40 mg total) by mouth 2 (two) times daily.   spironolactone (ALDACTONE) 25 MG tablet Take 1 tablet (25 mg total) by mouth at bedtime.   No facility-administered encounter medications on file as of 01/07/2023.    No results found for this or any previous visit (from the past 2160 hour(s)).   Psychiatric Specialty Exam: Physical Exam  Review of Systems  Weight 146 lb (66.2 kg).There is no height or weight on file to calculate BMI.  General Appearance: NA  Eye Contact:  NA  Speech:  Clear and Coherent and Normal Rate  Volume:  Normal  Mood:  Euthymic  Affect:  NA  Thought Process:  Goal Directed  Orientation:  Full (Time, Place, and Person)  Thought Content:  WDL  Suicidal Thoughts:  No  Homicidal Thoughts:  No  Memory:  Immediate;   Good Recent;   Good Remote;   Good  Judgement:  Good  Insight:  Present  Psychomotor Activity:  NA  Concentration:  Concentration: Good and Attention Span: Good  Recall:  Good  Fund of Knowledge:  Good  Language:  Good  Akathisia:  No  Handed:  Right  AIMS (if indicated):     Assets:  Communication Skills Desire for Improvement Housing Resilience Social Support  ADL's:  Intact  Cognition:  WNL  Sleep:  ok     Assessment/Plan: Major depressive disorder, recurrent episode, mild (HCC) - Plan: buPROPion (WELLBUTRIN XL) 300 MG 24 hr tablet, mirtazapine (REMERON) 30 MG tablet  Anxiety - Plan: buPROPion (WELLBUTRIN XL) 300 MG 24 hr tablet  Patient is stable on current medication.  She is not taking gabapentin 200 mg at bedtime prescribed by PCP that is helping her sleep and anxiety.  Patient not interested in therapy.  Continue mirtazapine 30 mg at bedtime and Wellbutrin XL 300 mg daily.  Recommended to call us back if she is any question or any concern.  Follow-up in 3 months.   Follow Up Instructions:     I discussed the assessment and  treatment plan with the patient. The patient was provided an opportunity to ask questions and all were answered. The patient agreed with the plan and demonstrated an understanding of the instructions.   The patient was advised to call back or seek an in-person evaluation if the symptoms worsen or if the condition fails to improve as anticipated.    Collaboration of Care: Other provider involved in patient's care AEB notes are available in epic to review.  Patient/Guardian was advised Release of Information must be obtained prior to any record release in order to collaborate their care with an outside provider. Patient/Guardian was advised if they have not already done so to contact the registration department to sign all necessary forms in order for [place] to release information regarding their care.   Consent: Patient/Guardian  gives verbal consent for treatment and assignment of benefits for services provided during this visit. Patient/Guardian expressed understanding and agreed to proceed.     I provided 18 minutes of non face to face time during this encounter.  Kathlee Nations, MD 01/07/2023

## 2023-01-09 ENCOUNTER — Other Ambulatory Visit: Payer: Self-pay | Admitting: Internal Medicine

## 2023-01-20 ENCOUNTER — Ambulatory Visit: Payer: 59 | Admitting: Student

## 2023-01-22 ENCOUNTER — Other Ambulatory Visit: Payer: Self-pay

## 2023-01-22 DIAGNOSIS — M8580 Other specified disorders of bone density and structure, unspecified site: Secondary | ICD-10-CM

## 2023-01-22 MED ORDER — ALENDRONATE SODIUM 70 MG PO TABS: ORAL_TABLET | ORAL | 6 refills | Status: DC

## 2023-01-22 MED ORDER — SPIRONOLACTONE 25 MG PO TABS: 25.0000 mg | ORAL_TABLET | Freq: Every day | ORAL | 2 refills | Status: DC

## 2023-01-22 MED ORDER — EZETIMIBE 10 MG PO TABS: 10.0000 mg | ORAL_TABLET | Freq: Every day | ORAL | 2 refills | Status: DC

## 2023-01-28 ENCOUNTER — Ambulatory Visit: Payer: 59 | Admitting: Podiatry

## 2023-02-16 ENCOUNTER — Ambulatory Visit: Payer: 59

## 2023-02-17 ENCOUNTER — Ambulatory Visit: Payer: 59

## 2023-02-17 DIAGNOSIS — H4010X0 Unspecified open-angle glaucoma, stage unspecified: Secondary | ICD-10-CM | POA: Insufficient documentation

## 2023-02-23 ENCOUNTER — Other Ambulatory Visit (HOSPITAL_COMMUNITY): Payer: Self-pay | Admitting: Psychiatry

## 2023-02-23 ENCOUNTER — Ambulatory Visit (INDEPENDENT_AMBULATORY_CARE_PROVIDER_SITE_OTHER): Payer: 59 | Admitting: Family Medicine

## 2023-02-23 ENCOUNTER — Other Ambulatory Visit: Payer: Self-pay | Admitting: Family Medicine

## 2023-02-23 VITALS — BP 125/103 | HR 65 | Temp 97.7°F | Ht 61.0 in | Wt 155.2 lb

## 2023-02-23 DIAGNOSIS — F330 Major depressive disorder, recurrent, mild: Secondary | ICD-10-CM

## 2023-02-23 DIAGNOSIS — R300 Dysuria: Secondary | ICD-10-CM

## 2023-02-23 DIAGNOSIS — F419 Anxiety disorder, unspecified: Secondary | ICD-10-CM

## 2023-02-23 DIAGNOSIS — N898 Other specified noninflammatory disorders of vagina: Secondary | ICD-10-CM | POA: Diagnosis not present

## 2023-02-23 DIAGNOSIS — B3731 Acute candidiasis of vulva and vagina: Secondary | ICD-10-CM

## 2023-02-23 LAB — POCT WET PREP (WET MOUNT)
Clue Cells Wet Prep Whiff POC: NEGATIVE
Trichomonas Wet Prep HPF POC: ABSENT
WBC, Wet Prep HPF POC: NONE SEEN

## 2023-02-23 MED ORDER — FLUCONAZOLE 150 MG PO TABS: 150.0000 mg | ORAL_TABLET | Freq: Once | ORAL | 0 refills | Status: AC

## 2023-02-23 NOTE — Progress Notes (Signed)
    SUBJECTIVE:   CHIEF COMPLAINT / HPI:   Patient presents with vaginal odor that started shortly after Easter at the beginning of the month. Also having vaginal discharge that was sometimes a little yellow and other times creamy white. Itching and burning with urination as well. She does not drink much water regularly. Denies any pelvic pain. No changes to urine or urinary output. Normal BM. She has not been sexually active in years.   OBJECTIVE:   BP (!) 125/103   Pulse 65   Temp 97.7 F (36.5 C)   Ht 5\' 1"  (1.549 m)   Wt 155 lb 3.2 oz (70.4 kg)   SpO2 100%   BMI 29.32 kg/m   General: Patient well-appearing, in no acute distress. Resp: normal work of breathing noted MSK: no CVA tenderness noted bilaterally  GU: normal labia without rash or external lesions, normal vagina, slightly white cervical discharge noted, no associated odor, no adnexal masses or tenderness noted  GU exam performed in the presence of chaperone, [employer].   ASSESSMENT/PLAN:   Vaginal discharge -wet prep pending for concern of BV or candida  -also concern for UTI, UA pending -encouraged adequate hydration daily  -last A1c 6.7, discussed importance of maintaining appropriate DM control as to prevent future fungal infections, advised to follow up with PCP as earliest convenience for repeat A1c and continued DM management     Alvie Fowles Robyne Peers, DO [facility]

## 2023-02-23 NOTE — Assessment & Plan Note (Addendum)
-  wet prep pending for concern of BV or candida  -also concern for UTI, UA pending -encouraged adequate hydration daily  -last A1c 6.7, discussed importance of maintaining appropriate DM control as to prevent future fungal infections, advised to follow up with PCP as earliest convenience for repeat A1c and continued DM management

## 2023-02-23 NOTE — Patient Instructions (Addendum)
It was great seeing you today!  Today we discussed your symptoms, we have done testing including a urine analysis to determine the cause of your symptoms. I will let you know of the results when they become available and prescribe appropriate treatment.   Please follow up with Dr. Melissa Noon to check your A1c, it is important to have your diabetes well-controlled as this can prevent fungal infections in the future as well.   Please follow up at your next scheduled appointment, if anything arises between now and then, please don't hesitate to contact our office.   Thank you for allowing [place] to be a part of your medical care!  Thank you, Dr. Robyne Peers

## 2023-02-24 ENCOUNTER — Other Ambulatory Visit: Payer: Self-pay | Admitting: Family Medicine

## 2023-02-24 LAB — URINALYSIS
Bilirubin, UA: NEGATIVE
Glucose, UA: NEGATIVE
Ketones, UA: NEGATIVE
Leukocytes,UA: NEGATIVE
Nitrite, UA: NEGATIVE
Specific Gravity, UA: 1.014 (ref 1.005–1.030)
Urobilinogen, Ur: 0.2 mg/dL (ref 0.2–1.0)
pH, UA: 7 (ref 5.0–7.5)

## 2023-02-24 NOTE — Addendum Note (Signed)
Addended by: Jennette Bill on: 02/24/2023 03:24 PM   Modules accepted: Orders

## 2023-02-26 ENCOUNTER — Telehealth: Payer: Self-pay

## 2023-02-26 NOTE — Telephone Encounter (Signed)
Received [facility] from [facility] asking for a refill of the following medication not found on med list.  Jardiance 25 mg Take 1 tab by mouth once daily  Please send Rx if appropriate.  Sunday Spillers, CMA

## 2023-03-02 ENCOUNTER — Telehealth: Payer: Self-pay

## 2023-03-02 MED ORDER — EMPAGLIFLOZIN 25 MG PO TABS: 25.0000 mg | ORAL_TABLET | Freq: Every day | ORAL | 3 refills | Status: DC

## 2023-03-02 NOTE — Telephone Encounter (Signed)
Patient calls nurse line reporting undesired effects to Diflucan.   She reports she took the one pill last night and went to sleep. She reports she woke up this morning with a sore mouth and sore throat. She reports it hurts to swallow.  She denies visualizing any ulcers. She denies any SOB, difficulty breathing, hives or swelling. No fevers.   Patient has an apt with PCP on 5/13 already scheduled. Patient to monitor her symptoms and to call if symptoms fail to improve or worsen.   Precautions discussed.   Will forward to PCP and provider who prescribed medication.

## 2023-03-02 NOTE — Addendum Note (Signed)
Addended by: Darral Dash on: 03/02/2023 09:54 AM   Modules accepted: Orders

## 2023-03-05 LAB — SPECIMEN STATUS REPORT

## 2023-03-05 LAB — URINALYSIS, MICROSCOPIC ONLY

## 2023-03-09 ENCOUNTER — Encounter: Payer: Self-pay | Admitting: Student

## 2023-03-09 ENCOUNTER — Ambulatory Visit (INDEPENDENT_AMBULATORY_CARE_PROVIDER_SITE_OTHER): Payer: 59 | Admitting: Student

## 2023-03-09 VITALS — BP 128/74 | HR 56 | Ht 61.0 in | Wt 152.6 lb

## 2023-03-09 DIAGNOSIS — I152 Hypertension secondary to endocrine disorders: Secondary | ICD-10-CM

## 2023-03-09 DIAGNOSIS — E1165 Type 2 diabetes mellitus with hyperglycemia: Secondary | ICD-10-CM

## 2023-03-09 DIAGNOSIS — R319 Hematuria, unspecified: Secondary | ICD-10-CM

## 2023-03-09 DIAGNOSIS — E1159 Type 2 diabetes mellitus with other circulatory complications: Secondary | ICD-10-CM | POA: Diagnosis not present

## 2023-03-09 DIAGNOSIS — E1169 Type 2 diabetes mellitus with other specified complication: Secondary | ICD-10-CM

## 2023-03-09 DIAGNOSIS — E785 Hyperlipidemia, unspecified: Secondary | ICD-10-CM

## 2023-03-09 LAB — POCT URINALYSIS DIP (MANUAL ENTRY)
Bilirubin, UA: NEGATIVE
Blood, UA: NEGATIVE
Glucose, UA: NEGATIVE mg/dL
Ketones, POC UA: NEGATIVE mg/dL
Nitrite, UA: NEGATIVE
Protein Ur, POC: NEGATIVE mg/dL
Spec Grav, UA: 1.02 (ref 1.010–1.025)
Urobilinogen, UA: 0.2 E.U./dL
pH, UA: 5.5 (ref 5.0–8.0)

## 2023-03-09 LAB — POCT GLYCOSYLATED HEMOGLOBIN (HGB A1C): HbA1c, POC (controlled diabetic range): 8.7 % — AB (ref 0.0–7.0)

## 2023-03-09 NOTE — Assessment & Plan Note (Signed)
At goal <130/80 Continue Amlodipine 10 mg daily Continue Losartan 50 mg daily Continue HCTZ 12.5 mg daily Continue Spironolactone 25 mg daily Continue Clonidine 0.2 mg qhs

## 2023-03-09 NOTE — Assessment & Plan Note (Signed)
A1c 8.7%, not at goal Increase Metformin to 1000 mg BID UTD on eye exam, foot exam Re-check A1c in 3 months

## 2023-03-09 NOTE — Progress Notes (Signed)
    SUBJECTIVE:   CHIEF COMPLAINT / HPI:   [name] is a 72 year-old female here for follow-up.  Reviewed her medication list initially and then addressed problems below:  T2DM Eats 1 meal a day and has some snacks. No nausea, vomiting. Takes Metformin 1000 mg daily  HTN Takes Amlodipine 10 mg daily, HCTZ 12.5 mg daily, Losartan 50 mg daily, Spironolactone 25 mg daily  Microscopic hematuria Seen on UA (2+ RBC) on 02/23/23 with Dr. Robyne Peers. Treated for candidal vaginitis with diflucan x1, with resolution of symptoms. No frank blood.  Otherwise, had a viral illness a week or so ago but feeling better. Had a cough with runny nose. Thinks the Jardiance made her feel this way.   PERTINENT  PMH / PSH: Reviewed  OBJECTIVE:   BP 128/74   Pulse (!) 56   Ht 5\' 1"  (1.549 m)   Wt 152 lb 9.6 oz (69.2 kg)   SpO2 100%   BMI 28.83 kg/m   General: Alert and cooperative and appears to be in no acute distress Cardio: Normal S1 and S2, no S3 or S4. Rhythm is regular. No murmurs or rubs.   Pulm: Clear to auscultation bilaterally, no crackles, wheezing, or diminished breath sounds. Normal respiratory effort Abdomen: Obese, Bowel sounds normal. Abdomen soft and non-tender.  Extremities: No peripheral edema. Warm/ well perfused.  Strong radial pulses. Neuro: Cranial nerves grossly intact   ASSESSMENT/PLAN:   Hypertension associated with diabetes (HCC) At goal <130/80 Continue Amlodipine 10 mg daily Continue Losartan 50 mg daily Continue HCTZ 12.5 mg daily Continue Spironolactone 25 mg daily Continue Clonidine 0.2 mg qhs  Type 2 diabetes mellitus with hyperglycemia, without long-term current use of insulin (HCC) A1c 8.7%, not at goal Increase Metformin to 1000 mg BID UTD on eye exam, foot exam Re-check A1c in 3 months  Will address other healthcare gaps at next visit: Colonoscopy, Medicare AWV, etc  Darral Dash, DO [facility]

## 2023-03-09 NOTE — Patient Instructions (Signed)
So great meeting you today, [name]!  As we discussed, - Your A1c is elevated to 8.7%. We want to bring this number down. To do so, we will increase your Metformin 1000 mg from once daily, to Metformin 1000 mg TWICE daily (once in the morning with breakfast, once in the evening with dinner). We will re-check your A1c in August. - Continue taking your other medicines as prescribed.  I will see you in 3 months. Please schedule an appointment at the front desk before leaving today.  Dr. Melissa Noon

## 2023-03-10 ENCOUNTER — Encounter: Payer: Self-pay | Admitting: Student

## 2023-03-10 ENCOUNTER — Telehealth: Payer: Self-pay | Admitting: Student

## 2023-03-10 DIAGNOSIS — N189 Chronic kidney disease, unspecified: Secondary | ICD-10-CM

## 2023-03-10 LAB — CBC
Hematocrit: 39 % (ref 34.0–46.6)
Hemoglobin: 12.4 g/dL (ref 11.1–15.9)
MCH: 26.8 pg (ref 26.6–33.0)
MCHC: 31.8 g/dL (ref 31.5–35.7)
MCV: 84 fL (ref 79–97)
Platelets: 388 10*3/uL (ref 150–450)
RBC: 4.62 x10E6/uL (ref 3.77–5.28)
RDW: 12.5 % (ref 11.7–15.4)
WBC: 7.4 10*3/uL (ref 3.4–10.8)

## 2023-03-10 LAB — BASIC METABOLIC PANEL
BUN/Creatinine Ratio: 11 — ABNORMAL LOW (ref 12–28)
BUN: 19 mg/dL (ref 8–27)
CO2: 18 mmol/L — ABNORMAL LOW (ref 20–29)
Calcium: 10.2 mg/dL (ref 8.7–10.3)
Chloride: 107 mmol/L — ABNORMAL HIGH (ref 96–106)
Creatinine, Ser: 1.71 mg/dL — ABNORMAL HIGH (ref 0.57–1.00)
Glucose: 164 mg/dL — ABNORMAL HIGH (ref 70–99)
Potassium: 5 mmol/L (ref 3.5–5.2)
Sodium: 141 mmol/L (ref 134–144)
eGFR: 31 mL/min/{1.73_m2} — ABNORMAL LOW (ref >59)

## 2023-03-10 LAB — URINALYSIS, MICROSCOPIC ONLY
Casts: NONE SEEN /lpf
Epithelial Cells (non renal): >10 /hpf — AB (ref 0–10)
RBC, Urine: NONE SEEN /hpf (ref 0–2)

## 2023-03-10 LAB — LIPID PANEL
Chol/HDL Ratio: 3.3 ratio (ref 0.0–4.4)
Cholesterol, Total: 113 mg/dL (ref 100–199)
HDL: 34 mg/dL — ABNORMAL LOW (ref >39)
LDL Chol Calc (NIH): 60 mg/dL (ref 0–99)
Triglycerides: 98 mg/dL (ref 0–149)
VLDL Cholesterol Cal: 19 mg/dL (ref 5–40)

## 2023-03-10 NOTE — Telephone Encounter (Signed)
Tried to call patient x3 to discuss lab results- straight to voicemail with full mailbox.  Tried to call alternative contact (Daughter, Blima Dessert) and phone number is no longer in service.  Will send letter. Will try again to call later.  Darral Dash, DO PGY-2 [facility]

## 2023-03-19 ENCOUNTER — Encounter: Payer: 59 | Admitting: Family Medicine

## 2023-03-25 ENCOUNTER — Other Ambulatory Visit (HOSPITAL_COMMUNITY): Payer: Self-pay | Admitting: Psychiatry

## 2023-03-25 DIAGNOSIS — F330 Major depressive disorder, recurrent, mild: Secondary | ICD-10-CM

## 2023-03-25 DIAGNOSIS — F419 Anxiety disorder, unspecified: Secondary | ICD-10-CM

## 2023-03-26 ENCOUNTER — Other Ambulatory Visit: Payer: Self-pay

## 2023-03-27 MED ORDER — PROPRANOLOL HCL 40 MG PO TABS: 40.0000 mg | ORAL_TABLET | Freq: Two times a day (BID) | ORAL | 2 refills | Status: DC

## 2023-03-27 MED ORDER — CLONIDINE HCL 0.2 MG PO TABS: 0.2000 mg | ORAL_TABLET | Freq: Every day | ORAL | 3 refills | Status: DC

## 2023-03-30 ENCOUNTER — Ambulatory Visit: Payer: 59 | Admitting: Podiatry

## 2023-04-06 ENCOUNTER — Ambulatory Visit (INDEPENDENT_AMBULATORY_CARE_PROVIDER_SITE_OTHER): Payer: 59 | Admitting: Student

## 2023-04-06 ENCOUNTER — Encounter: Payer: Self-pay | Admitting: Student

## 2023-04-06 VITALS — BP 118/66 | HR 62 | Ht 61.0 in | Wt 154.2 lb

## 2023-04-06 DIAGNOSIS — R319 Hematuria, unspecified: Secondary | ICD-10-CM

## 2023-04-06 DIAGNOSIS — N189 Chronic kidney disease, unspecified: Secondary | ICD-10-CM | POA: Diagnosis not present

## 2023-04-06 DIAGNOSIS — I120 Hypertensive chronic kidney disease with stage 5 chronic kidney disease or end stage renal disease: Secondary | ICD-10-CM

## 2023-04-06 DIAGNOSIS — E1165 Type 2 diabetes mellitus with hyperglycemia: Secondary | ICD-10-CM | POA: Diagnosis not present

## 2023-04-06 DIAGNOSIS — N185 Chronic kidney disease, stage 5: Secondary | ICD-10-CM

## 2023-04-06 LAB — POCT URINALYSIS DIP (MANUAL ENTRY)
Blood, UA: NEGATIVE
Glucose, UA: NEGATIVE mg/dL
Leukocytes, UA: NEGATIVE
Nitrite, UA: NEGATIVE
Protein Ur, POC: 30 mg/dL — AB
Spec Grav, UA: >=1.03 — AB (ref 1.010–1.025)
Urobilinogen, UA: 0.2 E.U./dL
pH, UA: 5.5 (ref 5.0–8.0)

## 2023-04-06 LAB — POCT UA - MICROSCOPIC ONLY: WBC, Ur, HPF, POC: NONE SEEN (ref 0–5)

## 2023-04-06 NOTE — Progress Notes (Signed)
SUBJECTIVE:   CHIEF COMPLAINT / HPI:   [name] is a pleasant 72 year-old female here for follow-up.  Taking blood pressure medications as prescribed.  Has blood pressure cuff at home, daughter checks her pressures.  Eating and drinking much better. Drinking lots of water. Cut back on breads, sweets, soda.  Has a follow-up appointment with her endocrinologist later this month.  Has burning with urination. Reporting some vaginal odor. No fever or chills.   PERTINENT  PMH / PSH:   Past Medical History:  Diagnosis Date   Adenomatous colon polyp    Allergy    Allergy to environmental factors 02/09/2020   Anxiety    Asthma    Back pain with radiation    radiopathy    Bilateral carpal tunnel syndrome 10/27/2010   Qualifier: Diagnosis of  By: Lodema Hong MD, [name]     Cataract    FORMING    Cellulitis of left upper extremity 02/09/2020   COPD (chronic obstructive pulmonary disease) (HCC)    Corneal abrasion, left 04/28/2013   Denture sore mouth 08/29/2021   Patient reports that she cannot wear her top dentures due to soreness in her mouth. She reports that this has been going on since her upper teeth were extracted. She requests referral to oral surgeon.   Depression    Diabetes mellitus without complication (HCC)    ON METFORMIN    Dizziness 09/07/2018   GERD (gastroesophageal reflux disease)    Glaucoma    History of tobacco abuse 08/18/2008   HSV-2 (herpes simplex virus 2) infection 02/29/2016   Dx 2014, + serology   Hx of migraines    Hyperlipidemia    Hyperlipidemia associated with type 2 diabetes mellitus (HCC) 05/17/2012   Hypertension    on meds   Itching in the vaginal area 05/17/2015   Nicotine addiction    PANIC ATTACK 11/05/2007   PELVIC  PAIN 08/13/2010   Plantar wart of right foot 09/14/2019   Snoring 08/29/2021   Type 2 diabetes mellitus with hyperglycemia (HCC) 11/22/2021   Urinary incontinence    Well controlled type 2 diabetes mellitus (HCC) 02/24/2018     Patient Care Team: Darral Dash, DO as PCP - General (Family Medicine) Jethro Bolus, MD as Consulting Physician (Ophthalmology) Pyrtle, Carie Caddy, MD as Consulting Physician (Gastroenterology) Cleotis Nipper, MD as Consulting Physician (Psychiatry) Juanell Fairly, RN as [facility] Care Management OBJECTIVE:  BP 118/66 (BP Location: Left Arm, Patient Position: Sitting, Cuff Size: Normal)   Pulse 62   Ht 5\' 1"  (1.549 m)   Wt 154 lb 3.2 oz (69.9 kg)   SpO2 99%   BMI 29.14 kg/m  Physical Exam Constitutional:      Appearance: Normal appearance.  Cardiovascular:     Pulses: Normal pulses.     Heart sounds: Normal heart sounds.  Pulmonary:     Effort: Pulmonary effort is normal.     Breath sounds: Normal breath sounds.  Abdominal:     Palpations: Abdomen is soft.     Tenderness: There is no abdominal tenderness.  Neurological:     General: No focal deficit present.     Mental Status: She is alert.  Psychiatric:        Mood and Affect: Mood normal.        Behavior: Behavior normal.      ASSESSMENT/PLAN:   Hematuria, unspecified type -     POCT UA - Microscopic Only -     POCT urinalysis dipstick  Hypertensive kidney disease with CKD (chronic kidney disease) stage V (HCC)        - Blood pressure at goal today.  Continue clonidine 0.2 mg at bedtime, spironolactone 25 mg daily, losartan 50 mg daily, propranolol 40 mg daily  Chronic kidney disease, unspecified CKD stage -     PTH, intact and calcium -     Protein / creatinine ratio, urine -     Renal function panel -     US RENAL;  scheduled for 6/14. - Plan to discuss lab findings and ultrasound at visit on 6/19.  If necessary, can refer to nephrology at that time.  Likely prerenal and hopeful that RFP will show improvement in renal function.  Patient opted to return for a visit for BV/yeast/STI screening swabs that she did not want to get this completed today for vaginal odor.  Appointment scheduled.  No  follow-ups on file. Darral Dash, DO 04/06/2023, 3:32 PM PGY-2, [facility]

## 2023-04-06 NOTE — Patient Instructions (Addendum)
It was great seeing you today.  -Continue taking your medications as prescribed. -I will plan to see you at our visit on 6/19 to discuss your results and to perform other testing as we discussed. -Please go to get your renal ultrasound at the scheduled time.   If you have any questions or concerns, please feel free to call the clinic.   Have a wonderful day,  Dr. Darral Dash [facility] [phone]

## 2023-04-07 ENCOUNTER — Encounter: Payer: Self-pay | Admitting: Podiatry

## 2023-04-07 ENCOUNTER — Ambulatory Visit (INDEPENDENT_AMBULATORY_CARE_PROVIDER_SITE_OTHER): Payer: 59 | Admitting: Podiatry

## 2023-04-07 DIAGNOSIS — M79675 Pain in left toe(s): Secondary | ICD-10-CM | POA: Diagnosis not present

## 2023-04-07 DIAGNOSIS — M79674 Pain in right toe(s): Secondary | ICD-10-CM | POA: Diagnosis not present

## 2023-04-07 DIAGNOSIS — B351 Tinea unguium: Secondary | ICD-10-CM

## 2023-04-07 DIAGNOSIS — E119 Type 2 diabetes mellitus without complications: Secondary | ICD-10-CM

## 2023-04-07 LAB — RENAL FUNCTION PANEL
Albumin: 4.5 g/dL (ref 3.8–4.8)
BUN/Creatinine Ratio: 10 — ABNORMAL LOW (ref 12–28)
BUN: 13 mg/dL (ref 8–27)
CO2: 22 mmol/L (ref 20–29)
Calcium: 10.1 mg/dL (ref 8.7–10.3)
Chloride: 106 mmol/L (ref 96–106)
Creatinine, Ser: 1.26 mg/dL — ABNORMAL HIGH (ref 0.57–1.00)
Glucose: 160 mg/dL — ABNORMAL HIGH (ref 70–99)
Phosphorus: 2.6 mg/dL — ABNORMAL LOW (ref 3.0–4.3)
Potassium: 4.1 mmol/L (ref 3.5–5.2)
Sodium: 144 mmol/L (ref 134–144)
eGFR: 45 mL/min/{1.73_m2} — ABNORMAL LOW (ref >59)

## 2023-04-07 LAB — PROTEIN / CREATININE RATIO, URINE
Creatinine, Urine: 227.3 mg/dL
Protein, Ur: 48.1 mg/dL
Protein/Creat Ratio: 212 mg/g creat — ABNORMAL HIGH (ref 0–200)

## 2023-04-07 LAB — PTH, INTACT AND CALCIUM: PTH: 55 pg/mL (ref 15–65)

## 2023-04-07 NOTE — Progress Notes (Signed)

## 2023-04-08 ENCOUNTER — Encounter (HOSPITAL_COMMUNITY): Payer: Self-pay | Admitting: Psychiatry

## 2023-04-08 ENCOUNTER — Telehealth (HOSPITAL_BASED_OUTPATIENT_CLINIC_OR_DEPARTMENT_OTHER): Payer: 59 | Admitting: Psychiatry

## 2023-04-08 VITALS — Wt 154.0 lb

## 2023-04-08 DIAGNOSIS — F330 Major depressive disorder, recurrent, mild: Secondary | ICD-10-CM

## 2023-04-08 DIAGNOSIS — F419 Anxiety disorder, unspecified: Secondary | ICD-10-CM | POA: Diagnosis not present

## 2023-04-08 MED ORDER — BUPROPION HCL ER (XL) 300 MG PO TB24
300.0000 mg | ORAL_TABLET | ORAL | 0 refills | Status: DC
Start: 2023-04-08 — End: 2023-07-08

## 2023-04-08 MED ORDER — MIRTAZAPINE 30 MG PO TABS: ORAL_TABLET | ORAL | 0 refills | Status: DC

## 2023-04-08 NOTE — Progress Notes (Signed)
[facility] MD Virtual Progress Note   Patient Location: Home Provider Location: Home Office  I connect with patient by video and verified that I am speaking with correct person by using two identifiers. I discussed the limitations of evaluation and management by telemedicine and the availability of in person appointments. I also discussed with the patient that there may be a patient responsible charge related to this service. The patient expressed understanding and agreed to proceed.  [name] [id number] 72 y.o.  04/08/2023 3:09 PM  History of Present Illness:  Patient is evaluated by video session.  She reported sadness and dysphoria because unable to contact her daughter for the past few days.  She also reported her phone was discontinued by her daughter.  Patient still has phone from her insurance company but sometime current phone does not work.  She is disappointed from the daughter but realized that daughter may be in the process of moving from [place] to [employer].  She admitted week ago visited the daughter and daughter was overwhelmed because of cleaning the house and also going to new places.  Patient told sleep is good.  She admitted recent blood work did not go very well.  She has hemoglobin A1c 8.7.  She admitted drinking soda and had gained weight.  Her creatinine was also increased.  She denies any crying spells or any feeling of hopelessness or worthlessness.  She liked the new living place since the landlord did fix things which were not working.  She has no tremor or shakes or any EPS.  She is taking Wellbutrin and mirtazapine.  She is prescribed gabapentin from primary care but she is unclear if she is taking.  Past Psychiatric History: H/O inpatient treatment at [facility] to detox from Xanax.  Saw Psychiatrist in [place], [place] and [facility].  Tried Paxil, Prozac, vistaril and Zoloft.  No h/o suicidal attempt or  any psychosis.    Outpatient Encounter Medications as of 04/08/2023  Medication Sig   acetaminophen (TYLENOL) 500 MG tablet Take 1,000 mg by mouth every 8 (eight) hours as needed for moderate pain.   Alcohol Swabs (ALCOHOL PADS) 70 % PADS SWAB TO cleanse AREA EVERY DAY pior TO blood glucose check   alendronate (FOSAMAX) 70 MG tablet TAKE ONE TABLET BY MOUTH EVERY 7 DAYS with a full glass of water and on an empty stomach   amLODipine (NORVASC) 10 MG tablet TAKE 1 Tablet BY MOUTH ONCE DAILY at bedtime   atorvastatin (LIPITOR) 80 MG tablet TAKE 1 TABLET(80 MG) BY MOUTH DAILY   Blood Glucose Monitoring Suppl (ACCU-CHEK GUIDE ME) w/Device KIT 1 Device by Does not apply route daily in the afternoon.   Blood Pressure Monitoring (BLOOD PRESSURE KIT) DEVI Please measure BP at same time each day   buPROPion (WELLBUTRIN XL) 300 MG 24 hr tablet Take 1 tablet (300 mg total) by mouth every morning.   clobetasol ointment (TEMOVATE) 0.05 % APPLY TOPICALLY TO THE AFFECTED AREA TWICE DAILY   cloNIDine (CATAPRES) 0.2 MG tablet Take 1 tablet (0.2 mg total) by mouth at bedtime.   clotrimazole (LOTRIMIN) 1 % cream Apply 1 application. topically 2 (two) times daily.   Easy Comfort Lancets MISC Use as directed to check blood sugar IN THE EVENING   empagliflozin (JARDIANCE) 25 MG TABS tablet Take 1 tablet (25 mg total) by mouth daily.   erythromycin ophthalmic ointment Place a 1/2 inch ribbon of ointment into the lower eyelid.  ezetimibe (ZETIA) 10 MG tablet Take 1 tablet (10 mg total) by mouth daily.   famotidine (PEPCID) 20 MG tablet TAKE ONE TABLET BY MOUTH BY MOUTH DAILY   fluticasone (FLONASE) 50 MCG/ACT nasal spray Place into the nose.   gabapentin (NEURONTIN) 100 MG capsule Take 1 capsule (100 mg total) by mouth 2 (two) times daily.   glucose blood (ACCU-CHEK GUIDE) test strip 1 each by Other route daily in the afternoon. Use as instructed   hydrochlorothiazide (HYDRODIURIL) 12.5 MG tablet Take 1 tablet (12.5  mg total) by mouth daily.   latanoprost (XALATAN) 0.005 % ophthalmic solution Place 1 drop into both eyes at bedtime.   linaclotide (LINZESS) 145 MCG CAPS capsule Take 1 capsule (145 mcg total) by mouth daily before breakfast.   loratadine (CLARITIN REDITABS) 10 MG dissolvable tablet Take 1 tablet (10 mg total) by mouth daily. As needed for allergy symptoms   losartan (COZAAR) 50 MG tablet TAKE 1 TABLET(50 MG) BY MOUTH DAILY   metFORMIN (GLUCOPHAGE) 1000 MG tablet Take 1 tablet (1,000 mg total) by mouth daily with breakfast.   mirtazapine (REMERON) 30 MG tablet TAKE 1 TABLET(30 MG) BY MOUTH AT BEDTIME   propranolol (INDERAL) 40 MG tablet Take 1 tablet (40 mg total) by mouth 2 (two) times daily.   spironolactone (ALDACTONE) 25 MG tablet Take 1 tablet (25 mg total) by mouth at bedtime.   No facility-administered encounter medications on file as of 04/08/2023.    Recent Results (from the past 2160 hour(s))  POCT Wet Prep [place] [place] [place])     Status: None   Collection Time: 02/23/23 10:10 AM  Result Value Ref Range   Source Wet Prep POC VAG    WBC, Wet Prep HPF POC NONE SEEN    Bacteria Wet Prep HPF POC Few Few   Clue Cells Wet Prep HPF POC None None   Clue Cells Wet Prep Whiff POC Negative Whiff    Yeast Wet Prep HPF POC None None   KOH Wet Prep POC None None   Trichomonas Wet Prep HPF POC Absent Absent  Urinalysis     Status: Abnormal   Collection Time: 02/23/23  5:26 PM  Result Value Ref Range   Specific Gravity, UA 1.014 1.005 - 1.030   pH, UA 7.0 5.0 - 7.5   Color, UA Yellow Yellow   Appearance Ur Clear Clear   Leukocytes,UA Negative Negative   Protein,UA Trace Negative/Trace   Glucose, UA Negative Negative   Ketones, UA Negative Negative   RBC, UA 2+ (A) Negative   Bilirubin, UA Negative Negative   Urobilinogen, Ur 0.2 0.2 - 1.0 mg/dL   Nitrite, UA Negative Negative  Urine Microscopic     Status: None   Collection Time: 02/23/23  5:26 PM  Result Value Ref Range   WBC, UA  CANCELED /hpf    Comment: Test not performed. Deterioration occurred during specimen handling. [facility] is providing the patient with re-collection instructions.  Result canceled by the ancillary.    Epithelial Cells (non renal) CANCELED     Comment: Test not performed  Result canceled by the ancillary.   Specimen status report     Status: None   Collection Time: 02/23/23  5:26 PM  Result Value Ref Range   specimen status report Comment     Comment: Written Authorization Written Authorization Written Authorization Received. Authorization received from [facility] Requisition 02-25-2023 Logged by Gabriel Rung   Urine Microscopic     Status: Abnormal   Collection Time:  03/09/23  3:00 PM  Result Value Ref Range   WBC, UA 0-5 0 - 5 /hpf   RBC, Urine None seen 0 - 2 /hpf   Epithelial Cells (non renal) >10 (A) 0 - 10 /hpf   Casts None seen None seen /lpf   Bacteria, UA Few None seen/Few  HgB A1c     Status: Abnormal   Collection Time: 03/09/23  3:31 PM  Result Value Ref Range   Hemoglobin A1C     HbA1c POC (<> result, manual entry)     HbA1c, POC (prediabetic range)     HbA1c, POC (controlled diabetic range) 8.7 (A) 0.0 - 7.0 %  POCT urinalysis dipstick     Status: Abnormal   Collection Time: 03/09/23  3:50 PM  Result Value Ref Range   Color, UA yellow yellow   Clarity, UA cloudy (A) clear   Glucose, UA negative negative mg/dL   Bilirubin, UA negative negative   Ketones, POC UA negative negative mg/dL   Spec Grav, UA 1.610 9.604 - 1.025   Blood, UA negative negative   pH, UA 5.5 5.0 - 8.0   Protein Ur, POC negative negative mg/dL   Urobilinogen, UA 0.2 0.2 or 1.0 E.U./dL   Nitrite, UA Negative Negative   Leukocytes, UA Trace (A) Negative  Basic Metabolic Panel     Status: Abnormal   Collection Time: 03/09/23  3:54 PM  Result Value Ref Range   Glucose 164 (H) 70 - 99 mg/dL   BUN 19 8 - 27 mg/dL   Creatinine, Ser 5.40 (H) 0.57 - 1.00 mg/dL   eGFR 31 (L) >98 JX/BJY/7.82    BUN/Creatinine Ratio 11 (L) 12 - 28   Sodium 141 134 - 144 mmol/L   Potassium 5.0 3.5 - 5.2 mmol/L   Chloride 107 (H) 96 - 106 mmol/L   CO2 18 (L) 20 - 29 mmol/L   Calcium 10.2 8.7 - 10.3 mg/dL  Lipid Panel     Status: Abnormal   Collection Time: 03/09/23  3:54 PM  Result Value Ref Range   Cholesterol, Total 113 100 - 199 mg/dL   Triglycerides 98 0 - 149 mg/dL   HDL 34 (L) >95 mg/dL   VLDL Cholesterol Cal 19 5 - 40 mg/dL   LDL Chol Calc (NIH) 60 0 - 99 mg/dL   Chol/HDL Ratio 3.3 0.0 - 4.4 ratio    Comment:                                   T. Chol/HDL Ratio                                             Men  Women                               1/2 Avg.Risk  3.4    3.3                                   Avg.Risk  5.0    4.4  2X Avg.Risk  9.6    7.1                                3X Avg.Risk 23.4   11.0   CBC     Status: None   Collection Time: 03/09/23  3:54 PM  Result Value Ref Range   WBC 7.4 3.4 - 10.8 x10E3/uL   RBC 4.62 3.77 - 5.28 x10E6/uL   Hemoglobin 12.4 11.1 - 15.9 g/dL   Hematocrit 15.1 76.1 - 46.6 %   MCV 84 79 - 97 fL   MCH 26.8 26.6 - 33.0 pg   MCHC 31.8 31.5 - 35.7 g/dL   RDW 60.7 37.1 - 06.2 %   Platelets 388 150 - 450 x10E3/uL  POCT UA - Microscopic Only     Status: Abnormal   Collection Time: 04/06/23  3:40 PM  Result Value Ref Range   WBC, Ur, HPF, POC NONE SEEN 0 - 5   RBC, Urine, Miroscopic 0-2 0 - 2   Bacteria, U Microscopic MODERATE None - Trace   Epithelial cells, urine per micros 10-20    Casts, Ur, LPF, POC 0-2 HYALINE   POCT urinalysis dipstick     Status: Abnormal   Collection Time: 04/06/23  3:40 PM  Result Value Ref Range   Color, UA yellow yellow   Clarity, UA clear clear   Glucose, UA negative negative mg/dL   Bilirubin, UA small (A) negative   Ketones, POC UA trace (5) (A) negative mg/dL   Spec Grav, UA >=6.948 (A) 1.010 - 1.025   Blood, UA negative negative   pH, UA 5.5 5.0 - 8.0   Protein Ur, POC =30 (A)  negative mg/dL   Urobilinogen, UA 0.2 0.2 or 1.0 E.U./dL   Nitrite, UA Negative Negative   Leukocytes, UA Negative Negative  PTH, Intact and Calcium     Status: None   Collection Time: 04/06/23  5:42 PM  Result Value Ref Range   PTH 55 15 - 65 pg/mL   PTH Interp Comment     Comment: Interpretation                 Intact PTH    Calcium                                 (pg/mL)      (mg/dL) Normal                          15 - 65     8.6 - 10.2 Primary Hyperparathyroidism         >65          >10.2 Secondary Hyperparathyroidism       >65          <10.2 Non-Parathyroid Hypercalcemia       <65          >10.2 Hypoparathyroidism                  <15          < 8.6 Non-Parathyroid Hypocalcemia    15 - 65          < 8.6   Renal Function Panel     Status: Abnormal   Collection Time: 04/06/23  5:42 PM  Result Value Ref Range   Glucose  160 (H) 70 - 99 mg/dL   BUN 13 8 - 27 mg/dL   Creatinine, Ser 6.21 (H) 0.57 - 1.00 mg/dL   eGFR 45 (L) >30 QM/VHQ/4.69   BUN/Creatinine Ratio 10 (L) 12 - 28   Sodium 144 134 - 144 mmol/L   Potassium 4.1 3.5 - 5.2 mmol/L   Chloride 106 96 - 106 mmol/L   CO2 22 20 - 29 mmol/L   Calcium 10.1 8.7 - 10.3 mg/dL   Phosphorus 2.6 (L) 3.0 - 4.3 mg/dL   Albumin 4.5 3.8 - 4.8 g/dL  Protein / creatinine ratio, urine     Status: Abnormal   Collection Time: 04/06/23  5:43 PM  Result Value Ref Range   Creatinine, Urine 227.3 Not Estab. mg/dL   Protein, Ur 62.9 Not Estab. mg/dL   Protein/Creat Ratio 528 (H) 0 - 200 mg/g creat     Psychiatric Specialty Exam: Physical Exam  Review of Systems  Weight 154 lb (69.9 kg).There is no height or weight on file to calculate BMI.  General Appearance: Casual  Eye Contact:  Fair  Speech:  Slow  Volume:  Decreased  Mood:  Dysphoric  Affect:  Congruent  Thought Process:  Goal Directed  Orientation:  Full (Time, Place, and Person)  Thought Content:  Rumination  Suicidal Thoughts:  No  Homicidal Thoughts:  No  Memory:   Immediate;   Good Recent;   Good Remote;   Fair  Judgement:  Fair  Insight:  Present  Psychomotor Activity:  Decreased  Concentration:  Concentration: Fair and Attention Span: Fair  Recall:  Good  Fund of Knowledge:  Good  Language:  Good  Akathisia:  No  Handed:  Right  AIMS (if indicated):     Assets:  Communication Skills Desire for Improvement Housing Resilience Social Support  ADL's:  Intact  Cognition:  WNL  Sleep:  ok     Assessment/Plan: Major depressive disorder, recurrent episode, mild (HCC) - Plan: buPROPion (WELLBUTRIN XL) 300 MG 24 hr tablet, mirtazapine (REMERON) 30 MG tablet  Anxiety - Plan: buPROPion (WELLBUTRIN XL) 300 MG 24 hr tablet  I reviewed blood work results.  Reassurance given about her daughter as she is busy in a process of moving.  Patient realizes she will call her daughter in few days.  She is not sure about her gabapentin and I recommend if she needs that she can call us back to her primary care doctor.  Refills the gabapentin.  We will continue Wellbutrin XL 300 mg daily, mirtazapine 30 mg at bedtime.  Encourage to watch her calorie intake and cut down the soda.  Recommend to start walking as she has gained weight and her blood sugar is higher than before.  She also have scheduled to see her kidney doctor for ultra sound of the kidney.  Recommend to call us back with any question or any concern.  Follow-up in 3 months.   Follow Up Instructions:     I discussed the assessment and treatment plan with the patient. The patient was provided an opportunity to ask questions and all were answered. The patient agreed with the plan and demonstrated an understanding of the instructions.   The patient was advised to call back or seek an in-person evaluation if the symptoms worsen or if the condition fails to improve as anticipated.    Collaboration of Care: Other provider involved in patient's care AEB notes are available in epic to  review.  Patient/Guardian was advised Release of Information  must be obtained prior to any record release in order to collaborate their care with an outside provider. Patient/Guardian was advised if they have not already done so to contact the registration department to sign all necessary forms in order for [place] to release information regarding their care.   Consent: Patient/Guardian gives verbal consent for treatment and assignment of benefits for services provided during this visit. Patient/Guardian expressed understanding and agreed to proceed.     I provided 24 minutes of non face to face time during this encounter.  Note: This document was prepared by [employer] voice dictation technology and any errors that results from this process are unintentional.    Cleotis Nipper, MD 04/08/2023

## 2023-04-10 ENCOUNTER — Ambulatory Visit (HOSPITAL_COMMUNITY): Payer: 59

## 2023-04-15 ENCOUNTER — Other Ambulatory Visit (HOSPITAL_COMMUNITY): Payer: Self-pay | Admitting: Psychiatry

## 2023-04-15 ENCOUNTER — Ambulatory Visit: Payer: Self-pay | Admitting: Student

## 2023-04-15 DIAGNOSIS — F419 Anxiety disorder, unspecified: Secondary | ICD-10-CM

## 2023-04-16 ENCOUNTER — Ambulatory Visit (HOSPITAL_COMMUNITY): Payer: 59

## 2023-04-20 ENCOUNTER — Other Ambulatory Visit: Payer: Self-pay | Admitting: Internal Medicine

## 2023-04-20 ENCOUNTER — Other Ambulatory Visit: Payer: Self-pay | Admitting: Student

## 2023-04-24 ENCOUNTER — Ambulatory Visit: Payer: 59 | Admitting: Internal Medicine

## 2023-05-04 ENCOUNTER — Other Ambulatory Visit: Payer: Self-pay | Admitting: Student

## 2023-05-04 DIAGNOSIS — I10 Essential (primary) hypertension: Secondary | ICD-10-CM

## 2023-05-07 ENCOUNTER — Ambulatory Visit (HOSPITAL_COMMUNITY): Payer: 59 | Attending: Family Medicine

## 2023-05-19 ENCOUNTER — Ambulatory Visit: Payer: 59 | Admitting: Internal Medicine

## 2023-05-19 NOTE — Progress Notes (Deleted)
Name: [name]  MRN/ DOB: [id number], 02-17-1951   Age/ Sex: 72 y.o., female    PCP: Darral Dash, DO   Reason for Endocrinology Evaluation: Type 2 Diabetes Mellitus     Date of Initial Endocrinology Visit: 09/03/2022    PATIENT IDENTIFIER: [name] is a 72 y.o. female with a past medical history of DM, HTN, COPD and dyslipidemia. The patient presented for initial endocrinology clinic visit on 09/03/2022 for consultative assistance with her diabetes management.    HPI: [name] was    Diagnosed with DM years ago  Prior Medications tried/Intolerance: Stopped Jaridnace 05/2022 due to weight loss           Hemoglobin A1c has ranged from 6.2% in 2020, peaking at 10.6% in 2023.  On her initial visit to our clinic she had an A1c of 6.7% she declined any weight loss medications, she was on metformin only which we decreased due to a GFR of less than 45   SUBJECTIVE:   During the last visit (09/03/2022): ***  Today (05/19/2023): [name] is here for follow-up on diabetes management. She  checks her  blood sugars *** times daily. The patient has *** had hypoglycemic episodes since the last clinic visit. The patient is *** symptomatic with these episodes.   She had a follow-up with podiatry 04/07/2023 She follows with psychiatry for major depression    HOME DIABETES REGIMEN: Metformin 1000 mg daily     Statin: yes ACE-I/ARB: yes   METER DOWNLOAD SUMMARY: Does not check    DIABETIC COMPLICATIONS: Microvascular complications:  Neuropathy Denies: CKD, retinopathy Last eye exam: Completed 11/11/2022  Macrovascular complications:   Denies: CAD, PVD, CVA   PAST HISTORY: Past Medical History:  Past Medical History:  Diagnosis Date   Adenomatous colon polyp    Allergy    Allergy to environmental factors 02/09/2020   Anxiety    Asthma    Back pain with radiation    radiopathy    Bilateral carpal tunnel syndrome 10/27/2010    Qualifier: Diagnosis of  By: Lodema Hong MD, [name]     Cataract    FORMING    Cellulitis of left upper extremity 02/09/2020   COPD (chronic obstructive pulmonary disease) (HCC)    Corneal abrasion, left 04/28/2013   Denture sore mouth 08/29/2021   Patient reports that she cannot wear her top dentures due to soreness in her mouth. She reports that this has been going on since her upper teeth were extracted. She requests referral to oral surgeon.   Depression    Diabetes mellitus without complication (HCC)    ON METFORMIN    Dizziness 09/07/2018   GERD (gastroesophageal reflux disease)    Glaucoma    History of tobacco abuse 08/18/2008   HSV-2 (herpes simplex virus 2) infection 02/29/2016   Dx 2014, + serology   Hx of migraines    Hyperlipidemia    Hyperlipidemia associated with type 2 diabetes mellitus (HCC) 05/17/2012   Hypertension    on meds   Itching in the vaginal area 05/17/2015   Nicotine addiction    PANIC ATTACK 11/05/2007   PELVIC  PAIN 08/13/2010   Plantar wart of right foot 09/14/2019   Snoring 08/29/2021   Type 2 diabetes mellitus with hyperglycemia (HCC) 11/22/2021   Urinary incontinence    Well controlled type 2 diabetes mellitus (HCC) 02/24/2018   Past Surgical History:  Past Surgical History:  Procedure Laterality Date   ABDOMINAL HYSTERECTOMY  bleeding ulcer repair  1992   Open Ex Lap with perforation of duodenal ulcer with pertitonitis   CHOLECYSTECTOMY  1999   COLONOSCOPY     HIATAL HERNIA REPAIR  1967   left ankle surgery  2008   POLYPECTOMY     removal of mild ducts from left breast  1982   SIMPLE MASTECTOMY Bilateral 06/04/2010   TOTAL ABDOMINAL HYSTERECTOMY W/ BILATERAL SALPINGOOPHORECTOMY  1989   TUBAL LIGATION  1975, 1978   x 2    Social History:  reports that she quit smoking about 5 years ago. Her smoking use included cigarettes. She started smoking about 47 years ago. She has a 10.5 pack-year smoking history. She has never used smokeless tobacco.  She reports that she does not drink alcohol and does not use drugs. Family History:  Family History  Problem Relation Age of Onset   Heart disease Mother    Diabetes Mother    Lupus Mother    Colon polyps Mother    Tuberculosis Father    Depression Sister    Diabetes Brother    Colon polyps Maternal Aunt    Heart disease Maternal Grandmother    Heart attack Maternal Aunt    Stomach cancer Sister    Endometriosis Daughter    Diabetes Daughter    Colon cancer Neg Hx    Esophageal cancer Neg Hx    Pancreatic cancer Neg Hx    Rectal cancer Neg Hx      HOME MEDICATIONS: Allergies as of 05/19/2023       Reactions   Aspirin Other (See Comments)   Dr told pt not to take asa d/t bleeding ulcer in 1985.   Penicillins Nausea Only   Tablets only.  Denies any airway involvement.     Latex Rash   Relieved with over the counter medications.        Medication List        Accurate as of May 19, 2023  7:34 AM. If you have any questions, ask your nurse or doctor.          Accu-Chek Guide Me w/Device Kit 1 Device by Does not apply route daily in the afternoon.   Accu-Chek Guide test strip Generic drug: glucose blood 1 each by Other route daily in the afternoon. Use as instructed   acetaminophen 500 MG tablet Commonly known as: TYLENOL Take 1,000 mg by mouth every 8 (eight) hours as needed for moderate pain.   Alcohol Pads 70 % Pads SWAB TO cleanse AREA EVERY DAY pior TO blood glucose check   alendronate 70 MG tablet Commonly known as: FOSAMAX TAKE ONE TABLET BY MOUTH EVERY 7 DAYS with a full glass of water and on an empty stomach   amLODipine 10 MG tablet Commonly known as: NORVASC TAKE 1 Tablet BY MOUTH ONCE DAILY at bedtime   atorvastatin 80 MG tablet Commonly known as: LIPITOR TAKE 1 TABLET(80 MG) BY MOUTH DAILY   Blood Pressure Kit Devi Please measure BP at same time each day   buPROPion 300 MG 24 hr tablet Commonly known as: Wellbutrin XL Take 1 tablet  (300 mg total) by mouth every morning.   clobetasol ointment 0.05 % Commonly known as: TEMOVATE APPLY TOPICALLY TO THE AFFECTED AREA TWICE DAILY   cloNIDine 0.2 MG tablet Commonly known as: Catapres Take 1 tablet (0.2 mg total) by mouth at bedtime.   clotrimazole 1 % cream Commonly known as: LOTRIMIN Apply 1 application. topically 2 (two) times daily.  Easy Comfort Lancets Misc Use as directed to check blood sugar IN THE EVENING   empagliflozin 25 MG Tabs tablet Commonly known as: JARDIANCE Take 1 tablet (25 mg total) by mouth daily.   erythromycin ophthalmic ointment Place a 1/2 inch ribbon of ointment into the lower eyelid.   ezetimibe 10 MG tablet Commonly known as: ZETIA Take 1 tablet (10 mg total) by mouth daily.   famotidine 20 MG tablet Commonly known as: PEPCID TAKE ONE TABLET BY MOUTH BY MOUTH DAILY   fluticasone 50 MCG/ACT nasal spray Commonly known as: FLONASE Place into the nose.   gabapentin 100 MG capsule Commonly known as: NEURONTIN take 1 Capsule by mouth twice daily   hydrochlorothiazide 12.5 MG tablet Commonly known as: HYDRODIURIL Take 1 tablet (12.5 mg total) by mouth daily.   latanoprost 0.005 % ophthalmic solution Commonly known as: XALATAN Place 1 drop into both eyes at bedtime.   linaclotide 145 MCG Caps capsule Commonly known as: LINZESS Take 1 capsule (145 mcg total) by mouth daily before breakfast.   loratadine 10 MG dissolvable tablet Commonly known as: CLARITIN REDITABS Take 1 tablet (10 mg total) by mouth daily. As needed for allergy symptoms   losartan 50 MG tablet Commonly known as: COZAAR TAKE 1 Tablet BY MOUTH ONCE DAILY   metFORMIN 1000 MG tablet Commonly known as: GLUCOPHAGE Take 1 tablet (1,000 mg total) by mouth daily with breakfast.   mirtazapine 30 MG tablet Commonly known as: REMERON TAKE 1 TABLET(30 MG) BY MOUTH AT BEDTIME   propranolol 40 MG tablet Commonly known as: INDERAL Take 1 tablet (40 mg total)  by mouth 2 (two) times daily.   spironolactone 25 MG tablet Commonly known as: ALDACTONE Take 1 tablet (25 mg total) by mouth at bedtime.         ALLERGIES: Allergies  Allergen Reactions   Aspirin Other (See Comments)    Dr told pt not to take asa d/t bleeding ulcer in 1985.   Penicillins Nausea Only    Tablets only.  Denies any airway involvement.     Latex Rash    Relieved with over the counter medications.     REVIEW OF SYSTEMS: A comprehensive ROS was conducted with the patient and is negative except as per HPI   OBJECTIVE:   VITAL SIGNS: There were no vitals taken for this visit.   PHYSICAL EXAM:  General: Pt appears well and is in NAD  Neck: General: Supple without adenopathy or carotid bruits. Thyroid: Thyroid size normal.  No goiter or nodules appreciated.   Lungs: Clear with good BS bilat with no rales, rhonchi, or wheezes  Heart: RRR   Abdomen:  soft, nontender  Extremities:  Lower extremities - No pretibial edema. No lesions.  Neuro: MS is good with appropriate affect, pt is alert and Ox3    DATA REVIEWED:  Lab Results  Component Value Date   HGBA1C 8.7 (A) 03/09/2023   HGBA1C 6.7 (A) 09/03/2022   HGBA1C 8.9 (A) 02/25/2022    Latest Reference Range & Units 04/06/23 17:42  Sodium 134 - 144 mmol/L 144  Potassium 3.5 - 5.2 mmol/L 4.1  Chloride 96 - 106 mmol/L 106  CO2 20 - 29 mmol/L 22  Glucose 70 - 99 mg/dL 272 (H)  BUN 8 - 27 mg/dL 13  Creatinine 5.36 - 6.44 mg/dL 0.34 (H)  Calcium 8.7 - 10.3 mg/dL 74.2  BUN/Creatinine Ratio 12 - 28  10 (L)  eGFR >59 mL/min/1.73 45 (L)  Phosphorus 3.0 -  4.3 mg/dL 2.6 (L)  Albumin 3.8 - 4.8 g/dL 4.5    Latest Reference Range & Units 04/06/23 17:43  Protein Urine Random Not Estab. mg/dL 42.7  Protein/Creat Ratio 0 - 200 mg/g creat 212 (H)  Creatinine, Urine Not Estab. mg/dL 062.3  (H): Data is abnormally high   Old records , labs and images have been reviewed.   ASSESSMENT / PLAN / RECOMMENDATIONS:    1) Type 2 Diabetes Mellitus, Optimally controlled, With Neuropathic complications - Most recent A1c of 6.7 %. Goal A1c < 7.0 %.    Plan: GENERAL: I have discussed with the patient the pathophysiology of diabetes. I explained the complications associated with diabetes including retinopathy, nephropathy, neuropathy as well as increased risk of cardiovascular disease. We went over the benefit seen with glycemic control.   I explained to the patient that diabetic patients are at higher than normal risk for amputations.  The patient was on Jardiance but she stopped it 3 months ago due to weight loss She is NOT interested in any weight loss medications Her A1c is optimal at 6.7%, her last GFR was low at 44, we discussed checking her renal function today and deciding on the dose of metformin In the meantime she was advised to check her glucose daily if possible at home, a prescription for Accu-Chek guide was sent to her pharmacy she was also trained by my CMA on how to check glucose at home The patient was advised to contact our office if her BG >150 mg/dL and I will start her on sulfonylurea BMP continues to show a GFR <45, will reduce metformin  MEDICATIONS: Decrease metformin 1000 mg 1 tablet daily  EDUCATION / INSTRUCTIONS: BG monitoring instructions: Patient is instructed to check her blood sugars 1 times a day, fasting. Call [facility] if: BG persistently < 70  I reviewed the Rule of 15 for the treatment of hypoglycemia in detail with the patient. Literature supplied.   2) Diabetic complications:  Eye: Does not have known diabetic retinopathy.  Neuro/ Feet: Does  have known diabetic peripheral neuropathy. Renal: Patient does not have known baseline CKD. She is  on an ACEI/ARB at present.      Signed electronically by: Lyndle Herrlich, MD  [facility]  [facility] [address] [place], [place] [address] Phone:  [phone] FAX: [phone]   CC: Darral Dash, DO [address] [place] [address] Phone: [phone]  Fax: [phone]    Return to Endocrinology clinic as below: Future Appointments  Date Time Provider Department Center  05/19/2023 11:10 AM , Konrad Dolores, MD [facility] None  07/08/2023  1:45 PM Helane Gunther, DPM [facility]  07/08/2023  4:00 PM Arfeen, Phillips Grout, MD [facility] None

## 2023-06-05 ENCOUNTER — Ambulatory Visit (INDEPENDENT_AMBULATORY_CARE_PROVIDER_SITE_OTHER): Payer: 59

## 2023-06-05 VITALS — Ht 61.0 in | Wt 154.0 lb

## 2023-06-05 DIAGNOSIS — Z0000 Encounter for general adult medical examination without abnormal findings: Secondary | ICD-10-CM | POA: Diagnosis not present

## 2023-06-06 NOTE — Patient Instructions (Signed)
[name] , Thank you for taking time to come for your Medicare Wellness Visit. I appreciate your ongoing commitment to your health goals. Please review the following plan we discussed and let me know if I can assist you in the future.   Referrals/Orders/Follow-Ups/Clinician Recommendations: Aim for 30 minutes of exercise or brisk walking, 6-8 glasses of water, and 5 servings of fruits and vegetables each day.  This is a list of the screening recommended for you and due dates:  Health Maintenance  Topic Date Due   Zoster (Shingles) Vaccine (1 of 2) Never done   Colon Cancer Screening  08/08/2019   COVID-19 Vaccine (5 - 2023-24 season) 06/27/2022   Flu Shot  05/28/2023   Hemoglobin A1C  09/09/2023   Eye exam for diabetics  11/12/2023   Yearly kidney function blood test for diabetes  04/05/2024   Yearly kidney health urinalysis for diabetes  04/05/2024   Complete foot exam   04/06/2024   Medicare Annual Wellness Visit  06/04/2024   DTaP/Tdap/Td vaccine (3 - Td or Tdap) 01/26/2031   Pneumonia Vaccine  Completed   DEXA scan (bone density measurement)  Completed   Hepatitis C Screening  Completed   HPV Vaccine  Aged Out    Advanced directives: (ACP Link)Information on Advanced Care Planning can be found at [facility] Advance Health Care Directives Advance Health Care Directives ([email or web address]   Next Medicare Annual Wellness Visit scheduled for next year: Yes  Preventive Care 65 Years and Older, Female Preventive care refers to lifestyle choices and visits with your health care provider that can promote health and wellness. What does preventive care include? A yearly physical exam. This is also called an annual well check. Dental exams once or twice a year. Routine eye exams. Ask your health care provider how often you should have your eyes checked. Personal lifestyle choices, including: Daily care of your teeth and gums. Regular physical activity. Eating a  healthy diet. Avoiding tobacco and drug use. Limiting alcohol use. Practicing safe sex. Taking low-dose aspirin every day. Taking vitamin and mineral supplements as recommended by your health care provider. What happens during an annual well check? The services and screenings done by your health care provider during your annual well check will depend on your age, overall health, lifestyle risk factors, and family history of disease. Counseling  Your health care provider may ask you questions about your: Alcohol use. Tobacco use. Drug use. Emotional well-being. Home and relationship well-being. Sexual activity. Eating habits. History of falls. Memory and ability to understand (cognition). Work and work Astronomer. Reproductive health. Screening  You may have the following tests or measurements: Height, weight, and BMI. Blood pressure. Lipid and cholesterol levels. These may be checked every 5 years, or more frequently if you are over 79 years old. Skin check. Lung cancer screening. You may have this screening every year starting at age 55 if you have a 30-pack-year history of smoking and currently smoke or have quit within the past 15 years. Fecal occult blood test (FOBT) of the stool. You may have this test every year starting at age 85. Flexible sigmoidoscopy or colonoscopy. You may have a sigmoidoscopy every 5 years or a colonoscopy every 10 years starting at age 70. Hepatitis C blood test. Hepatitis B blood test. Sexually transmitted disease (STD) testing. Diabetes screening. This is done by checking your blood sugar (glucose) after you have not eaten for a while (fasting). You may have this done every 1-3  years. Bone density scan. This is done to screen for osteoporosis. You may have this done starting at age 20. Mammogram. This may be done every 1-2 years. Talk to your health care provider about how often you should have regular mammograms. Talk with your health care  provider about your test results, treatment options, and if necessary, the need for more tests. Vaccines  Your health care provider may recommend certain vaccines, such as: Influenza vaccine. This is recommended every year. Tetanus, diphtheria, and acellular pertussis (Tdap, Td) vaccine. You may need a Td booster every 10 years. Zoster vaccine. You may need this after age 20. Pneumococcal 13-valent conjugate (PCV13) vaccine. One dose is recommended after age 18. Pneumococcal polysaccharide (PPSV23) vaccine. One dose is recommended after age 33. Talk to your health care provider about which screenings and vaccines you need and how often you need them. This information is not intended to replace advice given to you by your health care provider. Make sure you discuss any questions you have with your health care provider. Document Released: 11/09/2015 Document Revised: 07/02/2016 Document Reviewed: 08/14/2015 Elsevier Interactive Patient Education  2017 [employer].  Fall Prevention in the Home Falls can cause injuries. They can happen to people of all ages. There are many things you can do to make your home safe and to help prevent falls. What can I do on the outside of my home? Regularly fix the edges of walkways and driveways and fix any cracks. Remove anything that might make you trip as you walk through a door, such as a raised step or threshold. Trim any bushes or trees on the path to your home. Use bright outdoor lighting. Clear any walking paths of anything that might make someone trip, such as rocks or tools. Regularly check to see if handrails are loose or broken. Make sure that both sides of any steps have handrails. Any raised decks and porches should have guardrails on the edges. Have any leaves, snow, or ice cleared regularly. Use sand or salt on walking paths during winter. Clean up any spills in your garage right away. This includes oil or grease spills. What can I do in the  bathroom? Use night lights. Install grab bars by the toilet and in the tub and shower. Do not use towel bars as grab bars. Use non-skid mats or decals in the tub or shower. If you need to sit down in the shower, use a plastic, non-slip stool. Keep the floor dry. Clean up any water that spills on the floor as soon as it happens. Remove soap buildup in the tub or shower regularly. Attach bath mats securely with double-sided non-slip rug tape. Do not have throw rugs and other things on the floor that can make you trip. What can I do in the bedroom? Use night lights. Make sure that you have a light by your bed that is easy to reach. Do not use any sheets or blankets that are too big for your bed. They should not hang down onto the floor. Have a firm chair that has side arms. You can use this for support while you get dressed. Do not have throw rugs and other things on the floor that can make you trip. What can I do in the kitchen? Clean up any spills right away. Avoid walking on wet floors. Keep items that you use a lot in easy-to-reach places. If you need to reach something above you, use a strong step stool that has a grab  bar. Keep electrical cords out of the way. Do not use floor polish or wax that makes floors slippery. If you must use wax, use non-skid floor wax. Do not have throw rugs and other things on the floor that can make you trip. What can I do with my stairs? Do not leave any items on the stairs. Make sure that there are handrails on both sides of the stairs and use them. Fix handrails that are broken or loose. Make sure that handrails are as long as the stairways. Check any carpeting to make sure that it is firmly attached to the stairs. Fix any carpet that is loose or worn. Avoid having throw rugs at the top or bottom of the stairs. If you do have throw rugs, attach them to the floor with carpet tape. Make sure that you have a light switch at the top of the stairs and the  bottom of the stairs. If you do not have them, ask someone to add them for you. What else can I do to help prevent falls? Wear shoes that: Do not have high heels. Have rubber bottoms. Are comfortable and fit you well. Are closed at the toe. Do not wear sandals. If you use a stepladder: Make sure that it is fully opened. Do not climb a closed stepladder. Make sure that both sides of the stepladder are locked into place. Ask someone to hold it for you, if possible. Clearly mark and make sure that you can see: Any grab bars or handrails. First and last steps. Where the edge of each step is. Use tools that help you move around (mobility aids) if they are needed. These include: Canes. Walkers. Scooters. Crutches. Turn on the lights when you go into a dark area. Replace any light bulbs as soon as they burn out. Set up your furniture so you have a clear path. Avoid moving your furniture around. If any of your floors are uneven, fix them. If there are any pets around you, be aware of where they are. Review your medicines with your doctor. Some medicines can make you feel dizzy. This can increase your chance of falling. Ask your doctor what other things that you can do to help prevent falls. This information is not intended to replace advice given to you by your health care provider. Make sure you discuss any questions you have with your health care provider. Document Released: 08/09/2009 Document Revised: 03/20/2016 Document Reviewed: 11/17/2014 Elsevier Interactive Patient Education  2017 [employer].

## 2023-06-06 NOTE — Progress Notes (Signed)
Subjective:   [name] is a 72 y.o. female who presents for Medicare Annual (Subsequent) preventive examination.  Visit Complete: Virtual  I connected with  [name] on 06/05/23 by a audio enabled telemedicine application and verified that I am speaking with the correct person using two identifiers.  Patient Location: Home  Provider Location: Home Office  I discussed the limitations of evaluation and management by telemedicine. The patient expressed understanding and agreed to proceed.  Vital Signs: Unable to obtain new vitals due to this being a telehealth visit.  Review of Systems     Cardiac Risk Factors include: advanced age (>27men, >58 women);diabetes mellitus;hypertension;smoking/ tobacco exposure     Objective:    Today's Vitals   06/06/23 2015  Weight: 154 lb (69.9 kg)  Height: 5\' 1"  (1.549 m)   Body mass index is 29.1 kg/m.     06/06/2023    8:21 PM 03/09/2023    3:17 PM 02/23/2023    9:38 AM 08/07/2022    8:58 PM 04/08/2022    2:36 PM 03/04/2022   10:51 AM 02/25/2022    2:18 PM  Advanced Directives  Does Patient Have a Medical Advance Directive? No No No No No No No  Would patient like information on creating a medical advance directive? Yes (MAU/Ambulatory/Procedural Areas - Information given) No - Patient declined No - Patient declined  No - Patient declined No - Patient declined     Current Medications (verified) Outpatient Encounter Medications as of 06/05/2023  Medication Sig   acetaminophen (TYLENOL) 500 MG tablet Take 1,000 mg by mouth every 8 (eight) hours as needed for moderate pain.   Alcohol Swabs (ALCOHOL PADS) 70 % PADS SWAB TO cleanse AREA EVERY DAY pior TO blood glucose check   alendronate (FOSAMAX) 70 MG tablet TAKE ONE TABLET BY MOUTH EVERY 7 DAYS with a full glass of water and on an empty stomach   amLODipine (NORVASC) 10 MG tablet TAKE 1 Tablet BY MOUTH ONCE DAILY at bedtime   atorvastatin (LIPITOR) 80 MG tablet TAKE 1  TABLET(80 MG) BY MOUTH DAILY   Blood Glucose Monitoring Suppl (ACCU-CHEK GUIDE ME) w/Device KIT 1 Device by Does not apply route daily in the afternoon.   Blood Pressure Monitoring (BLOOD PRESSURE KIT) DEVI Please measure BP at same time each day   buPROPion (WELLBUTRIN XL) 300 MG 24 hr tablet Take 1 tablet (300 mg total) by mouth every morning.   clobetasol ointment (TEMOVATE) 0.05 % APPLY TOPICALLY TO THE AFFECTED AREA TWICE DAILY   cloNIDine (CATAPRES) 0.2 MG tablet Take 1 tablet (0.2 mg total) by mouth at bedtime.   clotrimazole (LOTRIMIN) 1 % cream Apply 1 application. topically 2 (two) times daily.   Easy Comfort Lancets MISC Use as directed to check blood sugar IN THE EVENING   empagliflozin (JARDIANCE) 25 MG TABS tablet Take 1 tablet (25 mg total) by mouth daily.   erythromycin ophthalmic ointment Place a 1/2 inch ribbon of ointment into the lower eyelid.   ezetimibe (ZETIA) 10 MG tablet Take 1 tablet (10 mg total) by mouth daily.   famotidine (PEPCID) 20 MG tablet TAKE ONE TABLET BY MOUTH BY MOUTH DAILY   fluticasone (FLONASE) 50 MCG/ACT nasal spray Place into the nose.   gabapentin (NEURONTIN) 100 MG capsule take 1 Capsule by mouth twice daily   glucose blood (ACCU-CHEK GUIDE) test strip 1 each by Other route daily in the afternoon. Use as instructed   hydrochlorothiazide (HYDRODIURIL) 12.5 MG tablet  Take 1 tablet (12.5 mg total) by mouth daily.   latanoprost (XALATAN) 0.005 % ophthalmic solution Place 1 drop into both eyes at bedtime.   linaclotide (LINZESS) 145 MCG CAPS capsule Take 1 capsule (145 mcg total) by mouth daily before breakfast.   loratadine (CLARITIN REDITABS) 10 MG dissolvable tablet Take 1 tablet (10 mg total) by mouth daily. As needed for allergy symptoms   losartan (COZAAR) 50 MG tablet TAKE 1 Tablet BY MOUTH ONCE DAILY   metFORMIN (GLUCOPHAGE) 1000 MG tablet Take 1 tablet (1,000 mg total) by mouth daily with breakfast.   mirtazapine (REMERON) 30 MG tablet TAKE 1  TABLET(30 MG) BY MOUTH AT BEDTIME   propranolol (INDERAL) 40 MG tablet Take 1 tablet (40 mg total) by mouth 2 (two) times daily.   spironolactone (ALDACTONE) 25 MG tablet Take 1 tablet (25 mg total) by mouth at bedtime.   No facility-administered encounter medications on file as of 06/05/2023.    Allergies (verified) Aspirin, Penicillins, and Latex   History: Past Medical History:  Diagnosis Date   Adenomatous colon polyp    Allergy    Allergy to environmental factors 02/09/2020   Anxiety    Asthma    Back pain with radiation    radiopathy    Bilateral carpal tunnel syndrome 10/27/2010   Qualifier: Diagnosis of  By: Lodema Hong MD, [name]     Cataract    FORMING    Cellulitis of left upper extremity 02/09/2020   COPD (chronic obstructive pulmonary disease) (HCC)    Corneal abrasion, left 04/28/2013   Denture sore mouth 08/29/2021   Patient reports that she cannot wear her top dentures due to soreness in her mouth. She reports that this has been going on since her upper teeth were extracted. She requests referral to oral surgeon.   Depression    Diabetes mellitus without complication (HCC)    ON METFORMIN    Dizziness 09/07/2018   GERD (gastroesophageal reflux disease)    Glaucoma    History of tobacco abuse 08/18/2008   HSV-2 (herpes simplex virus 2) infection 02/29/2016   Dx 2014, + serology   Hx of migraines    Hyperlipidemia    Hyperlipidemia associated with type 2 diabetes mellitus (HCC) 05/17/2012   Hypertension    on meds   Itching in the vaginal area 05/17/2015   Nicotine addiction    PANIC ATTACK 11/05/2007   PELVIC  PAIN 08/13/2010   Plantar wart of right foot 09/14/2019   Snoring 08/29/2021   Type 2 diabetes mellitus with hyperglycemia (HCC) 11/22/2021   Urinary incontinence    Well controlled type 2 diabetes mellitus (HCC) 02/24/2018   Past Surgical History:  Procedure Laterality Date   ABDOMINAL HYSTERECTOMY     bleeding ulcer repair  1992   Open Ex Lap with  perforation of duodenal ulcer with pertitonitis   CHOLECYSTECTOMY  1999   COLONOSCOPY     HIATAL HERNIA REPAIR  1967   left ankle surgery  2008   POLYPECTOMY     removal of mild ducts from left breast  1982   SIMPLE MASTECTOMY Bilateral 06/04/2010   TOTAL ABDOMINAL HYSTERECTOMY W/ BILATERAL SALPINGOOPHORECTOMY  1989   TUBAL LIGATION  1975, 1978   x 2   Family History  Problem Relation Age of Onset   Heart disease Mother    Diabetes Mother    Lupus Mother    Colon polyps Mother    Tuberculosis Father    Depression Sister    Diabetes  Brother    Colon polyps Maternal Aunt    Heart disease Maternal Grandmother    Heart attack Maternal Aunt    Stomach cancer Sister    Endometriosis Daughter    Diabetes Daughter    Colon cancer Neg Hx    Esophageal cancer Neg Hx    Pancreatic cancer Neg Hx    Rectal cancer Neg Hx    Social History   Socioeconomic History   Marital status: Legally Separated    Spouse name: Not on file   Number of children: 3   Years of education: 12   Highest education level: High school graduate  Occupational History   Occupation: retired   Occupation: Retired  Tobacco Use   Smoking status: Former    Current packs/day: 0.00    Average packs/day: 0.3 packs/day for 42.0 years (10.5 ttl pk-yrs)    Types: Cigarettes    Start date: 07/07/1975    Quit date: 07/06/2017    Years since quitting: 5.9   Smokeless tobacco: Never  Vaping Use   Vaping status: Never Used  Substance and Sexual Activity   Alcohol use: No    Alcohol/week: 0.0 standard drinks of alcohol   Drug use: No   Sexual activity: Not Currently    Birth control/protection: Post-menopausal  Other Topics Concern   Not on file  Social History Narrative   Patient lives alone in [place].    Patient uses public transportation.    Patient enjoys reading and watching detective shows.    Social Determinants of Health   Financial Resource Strain: Low Risk  (06/06/2023)   Overall Financial  Resource Strain (CARDIA)    Difficulty of Paying Living Expenses: Not hard at all  Food Insecurity: No Food Insecurity (06/06/2023)   Hunger Vital Sign    Worried About Running Out of Food in the Last Year: Never true    Ran Out of Food in the Last Year: Never true  Transportation Needs: Unmet Transportation Needs (06/06/2023)   PRAPARE - Transportation    Lack of Transportation (Medical): No    Lack of Transportation (Non-Medical): Yes  Physical Activity: Unknown (06/06/2023)   Exercise Vital Sign    Days of Exercise per Week: Not on file    Minutes of Exercise per Session: 0 min  Stress: No Stress Concern Present (06/06/2023)   [employer] of Occupational Health - Occupational Stress Questionnaire    Feeling of Stress : Not at all  Social Connections: Socially Isolated (06/06/2023)   Social Connection and Isolation Panel [NHANES]    Frequency of Communication with Friends and Family: More than three times a week    Frequency of Social Gatherings with Friends and Family: Three times a week    Attends Religious Services: Never    Active Member of Clubs or Organizations: No    Attends Engineer, structural: Never    Marital Status: Divorced    Tobacco Counseling Counseling given: Not Answered   Clinical Intake:  Pre-visit preparation completed: Yes  Pain : No/denies pain     Diabetes: Yes CBG done?: No Did pt. bring in CBG monitor from home?: No  How often do you need to have someone help you when you read instructions, pamphlets, or other written materials from your doctor or pharmacy?: 1 - Never  Interpreter Needed?: No  Information entered by :: Kandis Fantasia LPN   Activities of Daily Living    06/06/2023    8:20 PM  In your present state of  health, do you have any difficulty performing the following activities:  Hearing? 0  Vision? 0  Difficulty concentrating or making decisions? 0  Walking or climbing stairs? 0  Dressing or bathing? 0  Doing  errands, shopping? 0  Preparing Food and eating ? N  Using the Toilet? N  In the past six months, have you accidently leaked urine? N  Do you have problems with loss of bowel control? N  Managing your Medications? N  Managing your Finances? N  Housekeeping or managing your Housekeeping? N    Patient Care Team: Darral Dash, DO as PCP - General (Family Medicine) Jethro Bolus, MD as Consulting Physician (Ophthalmology) Pyrtle, Carie Caddy, MD as Consulting Physician (Gastroenterology) Lolly Mustache Phillips Grout, MD as Consulting Physician (Psychiatry) Juanell Fairly, RN as [facility] Care Management  Indicate any recent Medical Services you may have received from other than [facility] providers in the past year (date may be approximate).     Assessment:   This is a routine wellness examination for Beau.  Hearing/Vision screen Hearing Screening - Comments:: Denies hearing difficulties   Vision Screening - Comments:: Wears rx glasses - up to date with routine eye exams with (formerly with [facility])   Dietary issues and exercise activities discussed:     Goals Addressed               This Visit's Progress     COMPLETED: Client will have the abillity to take Medications within at the right time and right doasage with memory techniques (pt-stated)            Timeframe:  Long-Range Goal Priority:  High Start Date:   07/29/21                          Expected End Date:   10/25/21                     Patient Goals/Self-Care Activities: Patient will self administer medications as prescribed as evidenced by self report/primary caregiver report  Patient will attend all scheduled provider appointments as evidenced by clinician review of documented attendance to scheduled appointments and patient/caregiver report Patient will call pharmacy for medication refills as evidenced by patient report and review of pharmacy fill history as appropriate Patient will call provider office for  new concerns or questions as evidenced by review of documented incoming telephone call notes and patient report Patient will talk with her daughter about helping her with setting up her pill box monthly       COMPLETED: Effective Long-Term Care Planning        Patient Goals/Self-Care Activities : Over the next 30 days Review EMMI educational information on Advance Directive  Complete Advance Directive packet,  Have advance directive notarized and provide a copy to provider office Call LCSW if you have questions         Remain active and independent        Depression Screen    06/06/2023    8:18 PM 04/06/2023    3:21 PM 03/09/2023    3:16 PM 02/23/2023    9:37 AM 04/08/2022    2:35 PM 03/04/2022   10:55 AM 01/21/2022    3:09 PM  PHQ 2/9 Scores  PHQ - 2 Score 0 0 0 2 2  0  PHQ- 9 Score 0 0 0 10 9  9   Exception Documentation      Patient refusal  Fall Risk    06/06/2023    8:20 PM 04/06/2023    3:20 PM 03/09/2023    3:16 PM 02/23/2023    9:38 AM 04/08/2022    2:37 PM  Fall Risk   Falls in the past year? 0 0 0 0 1  Number falls in past yr: 0 0 0 0 1  Injury with Fall? 0 0 0 0 0  Risk for fall due to : No Fall Risks No Fall Risks     Follow up Falls prevention discussed;Education provided;Falls evaluation completed Falls evaluation completed   Falls evaluation completed  Comment     md informed    MEDICARE RISK AT HOME:  Medicare Risk at Home - 06/06/23 2020     Any stairs in or around the home? No    If so, are there any without handrails? No    Home free of loose throw rugs in walkways, pet beds, electrical cords, etc? Yes    Adequate lighting in your home to reduce risk of falls? Yes    Life alert? No    Use of a cane, walker or w/c? No    Grab bars in the bathroom? Yes    Shower chair or bench in shower? No    Elevated toilet seat or a handicapped toilet? No             TIMED UP AND GO:  Was the test performed?  No    Cognitive Function:      08/29/2021     3:48 PM  Montreal Cognitive Assessment   Visuospatial/ Executive (0/5) 2  Naming (0/3) 2  Attention: Read list of digits (0/2) 2  Attention: Read list of letters (0/1) 1  Attention: Serial 7 subtraction starting at 100 (0/3) 0  Language: Repeat phrase (0/2) 2  Language : Fluency (0/1) 0  Abstraction (0/2) 2  Delayed Recall (0/5) 1  Orientation (0/6) 6  Total 18      06/06/2023    8:21 PM 07/04/2021    3:48 PM  6CIT Screen  What Year? 0 points 0 points  What month? 0 points 0 points  What time? 0 points 0 points  Count back from 20 0 points 0 points  Months in reverse 2 points 2 points  Repeat phrase 0 points 0 points  Total Score 2 points 2 points    Immunizations Immunization History  Administered Date(s) Administered   Fluad Quad(high Dose 65+) 07/18/2021   H1N1 09/28/2008   Influenza Split 09/13/2012   Influenza Whole 09/16/2005, 10/03/2007, 08/08/2009, 08/13/2010   Influenza, High Dose Seasonal PF 07/13/2017   Influenza,inj,Quad PF,6+ Mos 07/07/2013, 10/02/2014, 11/26/2015, 08/01/2016, 07/14/2018, 08/05/2019, 09/14/2020   Influenza-Unspecified 07/13/2017, 09/09/2022   PFIZER Comirnaty(Gray Top)Covid-19 Tri-Sucrose Vaccine 03/26/2021   PFIZER(Purple Top)SARS-COV-2 Vaccination 03/27/2020, 04/23/2020   Pfizer Covid-19 Vaccine Bivalent Booster 79yrs & up 08/29/2021   Pneumococcal Conjugate-13 11/26/2015   Pneumococcal Polysaccharide-23 06/02/2004, 02/24/2018   Td 06/12/2004   Tdap 01/25/2021    TDAP status: Up to date  Flu Vaccine status: Due, Education has been provided regarding the importance of this vaccine. Advised may receive this vaccine at local pharmacy or Health Dept. Aware to provide a copy of the vaccination record if obtained from local pharmacy or Health Dept. Verbalized acceptance and understanding.  Pneumococcal vaccine status: Up to date  Covid-19 vaccine status: Information provided on how to obtain vaccines.   Qualifies for Shingles Vaccine? Yes    Zostavax completed No   Shingrix  Completed?: No.    Education has been provided regarding the importance of this vaccine. Patient has been advised to call insurance company to determine out of pocket expense if they have not yet received this vaccine. Advised may also receive vaccine at local pharmacy or Health Dept. Verbalized acceptance and understanding.  Screening Tests Health Maintenance  Topic Date Due   Zoster Vaccines- Shingrix (1 of 2) Never done   Colonoscopy  08/08/2019   COVID-19 Vaccine (5 - 2023-24 season) 06/27/2022   INFLUENZA VACCINE  05/28/2023   HEMOGLOBIN A1C  09/09/2023   OPHTHALMOLOGY EXAM  11/12/2023   Diabetic kidney evaluation - eGFR measurement  04/05/2024   Diabetic kidney evaluation - Urine ACR  04/05/2024   FOOT EXAM  04/06/2024   Medicare Annual Wellness (AWV)  06/04/2024   DTaP/Tdap/Td (3 - Td or Tdap) 01/26/2031   Pneumonia Vaccine 79+ Years old  Completed   DEXA SCAN  Completed   Hepatitis C Screening  Completed   HPV VACCINES  Aged Out    Health Maintenance  Health Maintenance Due  Topic Date Due   Zoster Vaccines- Shingrix (1 of 2) Never done   Colonoscopy  08/08/2019   COVID-19 Vaccine (5 - 2023-24 season) 06/27/2022   INFLUENZA VACCINE  05/28/2023    Colorectal cancer screening:  Declines at this time due to transportation constraints   Bone Density status: Completed 06/30/18. Results reflect: Bone density results: OSTEOPENIA. Repeat every 2 years.  Lung Cancer Screening: (Low Dose CT Chest recommended if Age 14-80 years, 20 pack-year currently smoking OR have quit w/in 15years.) does not qualify.   Lung Cancer Screening Referral: n/a  Additional Screening:  Hepatitis C Screening: does qualify; Completed 06/09/12  Vision Screening: Recommended annual ophthalmology exams for early detection of glaucoma and other disorders of the eye. Is the patient up to date with their annual eye exam?  Yes  Who is the provider or what is the name of  the office in which the patient attends annual eye exams? [facility] If pt is not established with a provider, would they like to be referred to a provider to establish care? No .   Dental Screening: Recommended annual dental exams for proper oral hygiene  Diabetic Foot Exam: Diabetic Foot Exam: Completed 04/07/23  Community Resource Referral / Chronic Care Management: CRR required this visit?  No   CCM required this visit?  No     Plan:     I have personally reviewed and noted the following in the patient's chart:   Medical and social history Use of alcohol, tobacco or illicit drugs  Current medications and supplements including opioid prescriptions. Patient is not currently taking opioid prescriptions. Functional ability and status Nutritional status Physical activity Advanced directives List of other physicians Hospitalizations, surgeries, and ER visits in previous 12 months Vitals Screenings to include cognitive, depression, and falls Referrals and appointments  In addition, I have reviewed and discussed with patient certain preventive protocols, quality metrics, and best practice recommendations. A written personalized care plan for preventive services as well as general preventive health recommendations were provided to patient.     Kandis Fantasia [place], [place]   8/75/6433   After Visit Summary: (MyChart) Due to this being a telephonic visit, the after visit summary with patients personalized plan was offered to patient via MyChart   Nurse Notes: No concerns at this time

## 2023-07-03 ENCOUNTER — Ambulatory Visit (INDEPENDENT_AMBULATORY_CARE_PROVIDER_SITE_OTHER): Payer: 59 | Admitting: Internal Medicine

## 2023-07-03 ENCOUNTER — Encounter: Payer: Self-pay | Admitting: Internal Medicine

## 2023-07-03 VITALS — BP 126/80 | HR 56 | Ht 61.0 in | Wt 153.0 lb

## 2023-07-03 DIAGNOSIS — E1142 Type 2 diabetes mellitus with diabetic polyneuropathy: Secondary | ICD-10-CM | POA: Diagnosis not present

## 2023-07-03 DIAGNOSIS — E1165 Type 2 diabetes mellitus with hyperglycemia: Secondary | ICD-10-CM | POA: Diagnosis not present

## 2023-07-03 DIAGNOSIS — N1831 Chronic kidney disease, stage 3a: Secondary | ICD-10-CM | POA: Diagnosis not present

## 2023-07-03 DIAGNOSIS — Z7984 Long term (current) use of oral hypoglycemic drugs: Secondary | ICD-10-CM

## 2023-07-03 DIAGNOSIS — E1122 Type 2 diabetes mellitus with diabetic chronic kidney disease: Secondary | ICD-10-CM | POA: Insufficient documentation

## 2023-07-03 LAB — POCT GLUCOSE (DEVICE FOR HOME USE): POC Glucose: 212 mg/dL — AB (ref 70–99)

## 2023-07-03 LAB — POCT GLYCOSYLATED HEMOGLOBIN (HGB A1C): Hemoglobin A1C: 8.9 % — AB (ref 4.0–5.6)

## 2023-07-03 MED ORDER — METFORMIN HCL 1000 MG PO TABS: 1000.0000 mg | ORAL_TABLET | Freq: Every day | ORAL | 3 refills | Status: DC

## 2023-07-03 MED ORDER — GLIPIZIDE 5 MG PO TABS
5.0000 mg | ORAL_TABLET | Freq: Every day | ORAL | 3 refills | Status: DC
Start: 2023-07-03 — End: 2024-06-28

## 2023-07-03 NOTE — Progress Notes (Signed)
Name: [name]  MRN/ DOB: [id number], July 20, 1951   Age/ Sex: 72 y.o., female    PCP: Darral Dash, DO   Reason for Endocrinology Evaluation: Type 2 Diabetes Mellitus     Date of Initial Endocrinology Visit: 09/03/2022    PATIENT IDENTIFIER: [name] is a 72 y.o. female with a past medical history of DM, HTN, COPD and dyslipidemia. The patient presented for initial endocrinology clinic visit on 09/03/2022 for consultative assistance with her diabetes management.    HPI: [name] was    Diagnosed with DM years ago  Prior Medications tried/Intolerance: Stopped Jaridnace 05/2022 due to weight loss  Hemoglobin A1c has ranged from 6.2% in 2020, peaking at 10.6% in 2023.  On initial visit to our clinic she had an A1c of 6.7%, she was on metformin only, patient was not interested in any weight loss medications, patient remained on metformin but we decreased the dose due to a low GFR <45    SUBJECTIVE:   During the last visit (09/03/2022): A1c 6.7%  Today (07/03/23): [name] is here for follow-up on diabetes management.  She checks her blood sugars occasionally . The patient has not had hypoglycemic episodes   Denies nausea or vomiting  Denies constipation or diarrhea    HOME DIABETES REGIMEN: Metformin 1000 mg daily    Statin: yes ACE-I/ARB: yes   METER DOWNLOAD SUMMARY: Does not check    DIABETIC COMPLICATIONS: Microvascular complications:  Neuropathy Denies: CKD, retinopathy Last eye exam: Completed 05/2022  Macrovascular complications:   Denies: CAD, PVD, CVA   PAST HISTORY: Past Medical History:  Past Medical History:  Diagnosis Date   Adenomatous colon polyp    Allergy    Allergy to environmental factors 02/09/2020   Anxiety    Asthma    Back pain with radiation    radiopathy    Bilateral carpal tunnel syndrome 10/27/2010   Qualifier: Diagnosis of  By: Lodema Hong [name], [name]     Cataract    FORMING    Cellulitis  of left upper extremity 02/09/2020   COPD (chronic obstructive pulmonary disease) (HCC)    Corneal abrasion, left 04/28/2013   Denture sore mouth 08/29/2021   Patient reports that she cannot wear her top dentures due to soreness in her mouth. She reports that this has been going on since her upper teeth were extracted. She requests referral to oral surgeon.   Depression    Diabetes mellitus without complication (HCC)    ON METFORMIN    Dizziness 09/07/2018   GERD (gastroesophageal reflux disease)    Glaucoma    History of tobacco abuse 08/18/2008   HSV-2 (herpes simplex virus 2) infection 02/29/2016   Dx 2014, + serology   Hx of migraines    Hyperlipidemia    Hyperlipidemia associated with type 2 diabetes mellitus (HCC) 05/17/2012   Hypertension    on meds   Itching in the vaginal area 05/17/2015   Nicotine addiction    PANIC ATTACK 11/05/2007   PELVIC  PAIN 08/13/2010   Plantar wart of right foot 09/14/2019   Snoring 08/29/2021   Type 2 diabetes mellitus with hyperglycemia (HCC) 11/22/2021   Urinary incontinence    Well controlled type 2 diabetes mellitus (HCC) 02/24/2018   Past Surgical History:  Past Surgical History:  Procedure Laterality Date   ABDOMINAL HYSTERECTOMY     bleeding ulcer repair  1992   Open Ex Lap with perforation of duodenal ulcer with pertitonitis   CHOLECYSTECTOMY  1999   COLONOSCOPY     HIATAL HERNIA REPAIR  1967   left ankle surgery  2008   POLYPECTOMY     removal of mild ducts from left breast  1982   SIMPLE MASTECTOMY Bilateral 06/04/2010   TOTAL ABDOMINAL HYSTERECTOMY W/ BILATERAL SALPINGOOPHORECTOMY  1989   TUBAL LIGATION  1975, 1978   x 2    Social History:  reports that she quit smoking about 5 years ago. Her smoking use included cigarettes. She started smoking about 48 years ago. She has a 10.5 pack-year smoking history. She has never used smokeless tobacco. She reports that she does not drink alcohol and does not use drugs. Family History:   Family History  Problem Relation Age of Onset   Heart disease Mother    Diabetes Mother    Lupus Mother    Colon polyps Mother    Tuberculosis Father    Depression Sister    Diabetes Brother    Colon polyps Maternal Aunt    Heart disease Maternal Grandmother    Heart attack Maternal Aunt    Stomach cancer Sister    Endometriosis Daughter    Diabetes Daughter    Colon cancer Neg Hx    Esophageal cancer Neg Hx    Pancreatic cancer Neg Hx    Rectal cancer Neg Hx      HOME MEDICATIONS: Allergies as of 07/03/2023       Reactions   Aspirin Other (See Comments)   Dr told pt not to take asa d/t bleeding ulcer in 1985.   Penicillins Nausea Only   Tablets only.  Denies any airway involvement.     Latex Rash   Relieved with over the counter medications.        Medication List        Accurate as of July 03, 2023  1:39 PM. If you have any questions, ask your nurse or doctor.          Accu-Chek Guide Me w/Device Kit 1 Device by Does not apply route daily in the afternoon.   Accu-Chek Guide test strip Generic drug: glucose blood 1 each by Other route daily in the afternoon. Use as instructed   acetaminophen 500 MG tablet Commonly known as: TYLENOL Take 1,000 mg by mouth every 8 (eight) hours as needed for moderate pain.   Alcohol Pads 70 % Pads SWAB TO cleanse AREA EVERY DAY pior TO blood glucose check   alendronate 70 MG tablet Commonly known as: FOSAMAX TAKE ONE TABLET BY MOUTH EVERY 7 DAYS with a full glass of water and on an empty stomach   amLODipine 10 MG tablet Commonly known as: NORVASC TAKE 1 Tablet BY MOUTH ONCE DAILY at bedtime   atorvastatin 80 MG tablet Commonly known as: LIPITOR TAKE 1 TABLET(80 MG) BY MOUTH DAILY   Blood Pressure Kit Devi Please measure BP at same time each day   buPROPion 300 MG 24 hr tablet Commonly known as: Wellbutrin XL Take 1 tablet (300 mg total) by mouth every morning.   clobetasol ointment 0.05 % Commonly  known as: TEMOVATE APPLY TOPICALLY TO THE AFFECTED AREA TWICE DAILY   cloNIDine 0.2 MG tablet Commonly known as: Catapres Take 1 tablet (0.2 mg total) by mouth at bedtime.   clotrimazole 1 % cream Commonly known as: LOTRIMIN Apply 1 application. topically 2 (two) times daily.   Easy Comfort Lancets Misc Use as directed to check blood sugar IN THE EVENING   empagliflozin 25 MG Tabs  tablet Commonly known as: JARDIANCE Take 1 tablet (25 mg total) by mouth daily.   erythromycin ophthalmic ointment Place a 1/2 inch ribbon of ointment into the lower eyelid.   ezetimibe 10 MG tablet Commonly known as: ZETIA Take 1 tablet (10 mg total) by mouth daily.   famotidine 20 MG tablet Commonly known as: PEPCID TAKE ONE TABLET BY MOUTH BY MOUTH DAILY   fluticasone 50 MCG/ACT nasal spray Commonly known as: FLONASE Place into the nose.   gabapentin 100 MG capsule Commonly known as: NEURONTIN take 1 Capsule by mouth twice daily   hydrochlorothiazide 12.5 MG tablet Commonly known as: HYDRODIURIL Take 1 tablet (12.5 mg total) by mouth daily.   latanoprost 0.005 % ophthalmic solution Commonly known as: XALATAN Place 1 drop into both eyes at bedtime.   linaclotide 145 MCG Caps capsule Commonly known as: LINZESS Take 1 capsule (145 mcg total) by mouth daily before breakfast.   loratadine 10 MG dissolvable tablet Commonly known as: CLARITIN REDITABS Take 1 tablet (10 mg total) by mouth daily. As needed for allergy symptoms   losartan 50 MG tablet Commonly known as: COZAAR TAKE 1 Tablet BY MOUTH ONCE DAILY   metFORMIN 1000 MG tablet Commonly known as: GLUCOPHAGE Take 1 tablet (1,000 mg total) by mouth daily with breakfast.   mirtazapine 30 MG tablet Commonly known as: REMERON TAKE 1 TABLET(30 MG) BY MOUTH AT BEDTIME   propranolol 40 MG tablet Commonly known as: INDERAL Take 1 tablet (40 mg total) by mouth 2 (two) times daily.   spironolactone 25 MG tablet Commonly known as:  ALDACTONE Take 1 tablet (25 mg total) by mouth at bedtime.         ALLERGIES: Allergies  Allergen Reactions   Aspirin Other (See Comments)    Dr told pt not to take asa d/t bleeding ulcer in 1985.   Penicillins Nausea Only    Tablets only.  Denies any airway involvement.     Latex Rash    Relieved with over the counter medications.     REVIEW OF SYSTEMS: A comprehensive ROS was conducted with the patient and is negative except as per HPI   OBJECTIVE:   VITAL SIGNS: BP 126/80 (BP Location: Left Arm, Patient Position: Sitting, Cuff Size: Small)   Pulse (!) 56   Ht 5\' 1"  (1.549 m)   Wt 153 lb (69.4 kg)   SpO2 96%   BMI 28.91 kg/m    PHYSICAL EXAM:  General: Pt appears well and is in NAD  Neck: General: Supple without adenopathy or carotid bruits. Thyroid: Thyroid size normal.  No goiter or nodules appreciated.   Lungs: Clear with good BS bilat with no rales, rhonchi, or wheezes  Heart: RRR   Abdomen:  soft, nontender  Extremities:  Lower extremities - No pretibial edema.   Neuro: MS is good with appropriate affect, pt is alert and Ox3     Dm Foot Exam 07/03/2023   The skin of the feet is intact without sores or ulcerations. The pedal pulses are 1+ on right and 1+ on left. The sensation is intact to a screening 5.07, 10 gram monofilament bilaterally   DATA REVIEWED:  Lab Results  Component Value Date   HGBA1C 8.7 (A) 03/09/2023   HGBA1C 6.7 (A) 09/03/2022   HGBA1C 8.9 (A) 02/25/2022    In office BG 212 mg/DL  Latest Reference Range & Units 04/06/23 17:42  Sodium 134 - 144 mmol/L 144  Potassium 3.5 - 5.2 mmol/L 4.1  Chloride 96 - 106 mmol/L 106  CO2 20 - 29 mmol/L 22  Glucose 70 - 99 mg/dL 474 (H)  BUN 8 - 27 mg/dL 13  Creatinine 2.59 - 5.63 mg/dL 8.75 (H)  Calcium 8.7 - 10.3 mg/dL 64.3  BUN/Creatinine Ratio 12 - 28  10 (L)  eGFR >59 mL/min/1.73 45 (L)  Phosphorus 3.0 - 4.3 mg/dL 2.6 (L)  Albumin 3.8 - 4.8 g/dL 4.5  (H): Data is abnormally  high (L): Data is abnormally low  Old records , labs and images have been reviewed.   ASSESSMENT / PLAN / RECOMMENDATIONS:   1) Type 2 Diabetes Mellitus, Poorly  controlled, With Neuropathic , CKD III complications - Most recent A1c of 8.9 %. Goal A1c < 7.0 %.     -A1c has trended up from 6.7% to 8.9% -She used to be on Jardiance, she discontinued this due to weight loss.  Today I revisited Jardiance due to cardiovascular and renal benefits, but she would like to avoid it - She is NOT interested in any weight loss medications -We decreased metformin by 50% due to  GFR <45, will reduce metformin -I have recommended sulfonylurea, emphasized the importance of taking this 15-20 minutes before the first meal of the day, which in her case is dinner, caution against hypoglycemia -In office BG 212 mg/DL, patient drank Gatorade.  Advised patient to avoid all sugar sweetened beverages  MEDICATIONS: Continue metformin 1000 mg 1 tablet daily Start glipizide 5 mg daily  EDUCATION / INSTRUCTIONS: BG monitoring instructions: Patient is instructed to check her blood sugars 1 times a day, fasting. Call [place] [facility] if: BG persistently < 70  I reviewed the Rule of 15 for the treatment of hypoglycemia in detail with the patient. Literature supplied.   2) Diabetic complications:  Eye: Does not have known diabetic retinopathy.  Neuro/ Feet: Does  have known diabetic peripheral neuropathy. Renal: Patient does have known baseline CKD. She is  on an ACEI/ARB at present.   Follow-up in 4 months   Signed electronically by: Lyndle Herrlich, [name]  [facility] Endocrinology  [facility] [address] [place] [place], [place] [address] Phone: [phone] FAX: [phone]   CC: Darral Dash, DO [address] [place] [address] Phone: [phone]  Fax: [phone]    Return to Endocrinology clinic as below: Future Appointments  Date Time Provider  Department Center  07/03/2023  2:20 PM [name], Emily Dolores, [name] [facility] None  07/08/2023  1:45 PM Helane Gunther, DPM [facility]  07/08/2023  4:00 PM Arfeen, Phillips Grout, [name] [facility] None  06/10/2024  1:00 PM [facility] ANNUAL WELLNESS VISIT [facility]

## 2023-07-03 NOTE — Patient Instructions (Addendum)
Start Glipizide 5 mg, 1 tablet before the first meal of the day  Take Metformin 1000 mg , 1 tablet daily     HOW TO TREAT LOW BLOOD SUGARS (Blood sugar LESS THAN 70 MG/DL) Please follow the RULE OF 15 for the treatment of hypoglycemia treatment (when your (blood sugars are less than 70 mg/dL)   STEP 1: Take 15 grams of carbohydrates when your blood sugar is low, which includes:  3-4 GLUCOSE TABS  OR 3-4 OZ OF JUICE OR REGULAR SODA OR ONE TUBE OF GLUCOSE GEL    STEP 2: RECHECK blood sugar in 15 MINUTES STEP 3: If your blood sugar is still low at the 15 minute recheck --> then, go back to STEP 1 and treat AGAIN with another 15 grams of carbohydrates.

## 2023-07-08 ENCOUNTER — Telehealth (HOSPITAL_BASED_OUTPATIENT_CLINIC_OR_DEPARTMENT_OTHER): Payer: 59 | Admitting: Psychiatry

## 2023-07-08 ENCOUNTER — Encounter (HOSPITAL_COMMUNITY): Payer: Self-pay | Admitting: Psychiatry

## 2023-07-08 ENCOUNTER — Ambulatory Visit: Payer: 59 | Admitting: Podiatry

## 2023-07-08 VITALS — Wt 154.0 lb

## 2023-07-08 DIAGNOSIS — F330 Major depressive disorder, recurrent, mild: Secondary | ICD-10-CM | POA: Diagnosis not present

## 2023-07-08 DIAGNOSIS — F419 Anxiety disorder, unspecified: Secondary | ICD-10-CM | POA: Diagnosis not present

## 2023-07-08 MED ORDER — BUPROPION HCL ER (XL) 300 MG PO TB24
300.0000 mg | ORAL_TABLET | ORAL | 0 refills | Status: DC
Start: 2023-07-08 — End: 2024-01-05

## 2023-07-08 MED ORDER — GABAPENTIN 100 MG PO CAPS: 100.0000 mg | ORAL_CAPSULE | Freq: Two times a day (BID) | ORAL | 0 refills | Status: DC

## 2023-07-08 MED ORDER — MIRTAZAPINE 30 MG PO TABS: ORAL_TABLET | ORAL | 0 refills | Status: DC

## 2023-07-08 NOTE — Progress Notes (Signed)
[facility] MD Virtual Progress Note   Patient Location: Home Provider Location: Home Office  I connect with patient by telephone and verified that I am speaking with correct person by using two identifiers. I discussed the limitations of evaluation and management by telemedicine and the availability of in person appointments. I also discussed with the patient that there may be a patient responsible charge related to this service. The patient expressed understanding and agreed to proceed.  [name] [id number] 72 y.o.  07/08/2023 3:41 PM  History of Present Illness:  Patient is evaluated by phone session.  She apologized for her phone is not working for video session and she preferred to do phone session.  She reported things are going okay Ativan she has some time trouble sleeping at night.  Patient told 1 month ago her ex-husband's cousin walked away from her home in [place] and they could not find her.  She admitted some stress and anxiety because she knows the person for a while.  She is not sure if that triggered her insomnia but otherwise things are going very well.  She recently had a blood work and her hemoglobin A1c is 8.9.  She understands she need to watch her calorie intake.  She tried walking every day.  Her weight is unchanged from the past.  She denies any crying spells or any feeling of hopelessness or worthlessness.  She denies any suicidal thoughts.  Her anxiety is under control and she denies any major panic attack.  Her daughter is trying to help her to get a new phone.  She is taking gabapentin 100 mg twice a day which is now prescribed by our office.  He also compliant with Wellbutrin and mirtazapine.  Past Psychiatric History: H/O inpatient treatment at [facility] to detox from Xanax.  Saw Psychiatrist in [place], [place] and [facility].  Tried Paxil, Prozac, vistaril and Zoloft.  No h/o suicidal attempt or any  psychosis.    Outpatient Encounter Medications as of 07/08/2023  Medication Sig   acetaminophen (TYLENOL) 500 MG tablet Take 1,000 mg by mouth every 8 (eight) hours as needed for moderate pain.   Alcohol Swabs (ALCOHOL PADS) 70 % PADS SWAB TO cleanse AREA EVERY DAY pior TO blood glucose check   alendronate (FOSAMAX) 70 MG tablet TAKE ONE TABLET BY MOUTH EVERY 7 DAYS with a full glass of water and on an empty stomach   amLODipine (NORVASC) 10 MG tablet TAKE 1 Tablet BY MOUTH ONCE DAILY at bedtime   atorvastatin (LIPITOR) 80 MG tablet TAKE 1 TABLET(80 MG) BY MOUTH DAILY   Blood Glucose Monitoring Suppl (ACCU-CHEK GUIDE ME) w/Device KIT 1 Device by Does not apply route daily in the afternoon.   Blood Pressure Monitoring (BLOOD PRESSURE KIT) DEVI Please measure BP at same time each day   buPROPion (WELLBUTRIN XL) 300 MG 24 hr tablet Take 1 tablet (300 mg total) by mouth every morning.   clobetasol ointment (TEMOVATE) 0.05 % APPLY TOPICALLY TO THE AFFECTED AREA TWICE DAILY   cloNIDine (CATAPRES) 0.2 MG tablet Take 1 tablet (0.2 mg total) by mouth at bedtime.   clotrimazole (LOTRIMIN) 1 % cream Apply 1 application. topically 2 (two) times daily.   Easy Comfort Lancets MISC Use as directed to check blood sugar IN THE EVENING   erythromycin ophthalmic ointment Place a 1/2 inch ribbon of ointment into the lower eyelid.   ezetimibe (ZETIA) 10 MG tablet Take 1 tablet (10 mg  total) by mouth daily.   famotidine (PEPCID) 20 MG tablet TAKE ONE TABLET BY MOUTH BY MOUTH DAILY   fluticasone (FLONASE) 50 MCG/ACT nasal spray Place into the nose.   gabapentin (NEURONTIN) 100 MG capsule take 1 Capsule by mouth twice daily   glipiZIDE (GLUCOTROL) 5 MG tablet Take 1 tablet (5 mg total) by mouth daily before breakfast.   glucose blood (ACCU-CHEK GUIDE) test strip 1 each by Other route daily in the afternoon. Use as instructed   hydrochlorothiazide (HYDRODIURIL) 12.5 MG tablet Take 1 tablet (12.5 mg total) by mouth  daily.   latanoprost (XALATAN) 0.005 % ophthalmic solution Place 1 drop into both eyes at bedtime.   linaclotide (LINZESS) 145 MCG CAPS capsule Take 1 capsule (145 mcg total) by mouth daily before breakfast.   loratadine (CLARITIN REDITABS) 10 MG dissolvable tablet Take 1 tablet (10 mg total) by mouth daily. As needed for allergy symptoms   losartan (COZAAR) 50 MG tablet TAKE 1 Tablet BY MOUTH ONCE DAILY   metFORMIN (GLUCOPHAGE) 1000 MG tablet Take 1 tablet (1,000 mg total) by mouth daily with breakfast.   mirtazapine (REMERON) 30 MG tablet TAKE 1 TABLET(30 MG) BY MOUTH AT BEDTIME   propranolol (INDERAL) 40 MG tablet Take 1 tablet (40 mg total) by mouth 2 (two) times daily.   spironolactone (ALDACTONE) 25 MG tablet Take 1 tablet (25 mg total) by mouth at bedtime.   No facility-administered encounter medications on file as of 07/08/2023.    Recent Results (from the past 2160 hour(s))  POCT Glucose (Device for Home Use)     Status: Abnormal   Collection Time: 07/03/23  1:35 PM  Result Value Ref Range   Glucose Fasting, POC     POC Glucose 212 (A) 70 - 99 mg/dl  POCT glycosylated hemoglobin (Hb A1C)     Status: Abnormal   Collection Time: 07/03/23  1:39 PM  Result Value Ref Range   Hemoglobin A1C 8.9 (A) 4.0 - 5.6 %   HbA1c POC (<> result, manual entry)     HbA1c, POC (prediabetic range)     HbA1c, POC (controlled diabetic range)       Psychiatric Specialty Exam: Physical Exam  Review of Systems  Weight 154 lb (69.9 kg).There is no height or weight on file to calculate BMI.  General Appearance: NA  Eye Contact:  NA  Speech:  Slow  Volume:  Normal  Mood:  Euthymic  Affect:  NA  Thought Process:  Goal Directed  Orientation:  Full (Time, Place, and Person)  Thought Content:  Logical  Suicidal Thoughts:  No  Homicidal Thoughts:  No  Memory:  Immediate;   Good Recent;   Good Remote;   Fair  Judgement:  Intact  Insight:  Present  Psychomotor Activity:  NA  Concentration:   Concentration: Fair and Attention Span: Fair  Recall:  Good  Fund of Knowledge:  Good  Language:  Good  Akathisia:  No  Handed:  Right  AIMS (if indicated):     Assets:  Communication Skills Desire for Improvement Housing Resilience Social Support  ADL's:  Intact  Cognition:  WNL  Sleep:  fair     Assessment/Plan: Major depressive disorder, recurrent episode, mild (HCC) - Plan: mirtazapine (REMERON) 30 MG tablet, buPROPion (WELLBUTRIN XL) 300 MG 24 hr tablet  Anxiety - Plan: gabapentin (NEURONTIN) 100 MG capsule, buPROPion (WELLBUTRIN XL) 300 MG 24 hr tablet  Discussed psychosocial stressors.  Reassurance given.  Recommend to watch her calorie intake since  her hemoglobin A1c is slightly increased from the past.  I also recommend to try to take the gabapentin both pill at bedtime to help her sleep and anxiety.  Patient agreed with the plan.  So far she is tolerating medication and reported no side effects.  I reviewed blood work results.  Continue Wellbutrin XL 300 mg daily and mirtazapine 30 mg at bedtime.  Encourage walking and healthy eating and lifestyle.  Her last basic metabolic panel which was done in May shows creatinine 1.71.  She is supposed to have ultrasound of the kidney.  Reminded her health concerns.  Recommend to call us back if she has any question or any concern.  Follow-up in 3 months   Follow Up Instructions:     I discussed the assessment and treatment plan with the patient. The patient was provided an opportunity to ask questions and all were answered. The patient agreed with the plan and demonstrated an understanding of the instructions.   The patient was advised to call back or seek an in-person evaluation if the symptoms worsen or if the condition fails to improve as anticipated.    Collaboration of Care: Other provider involved in patient's care AEB notes are available in epic to review.  Patient/Guardian was advised Release of Information must be obtained  prior to any record release in order to collaborate their care with an outside provider. Patient/Guardian was advised if they have not already done so to contact the registration department to sign all necessary forms in order for [place] to release information regarding their care.   Consent: Patient/Guardian gives verbal consent for treatment and assignment of benefits for services provided during this visit. Patient/Guardian expressed understanding and agreed to proceed.     I provided 28 minutes of non face to face time during this encounter.  Note: This document was prepared by [employer] voice dictation technology and any errors that results from this process are unintentional.    Cleotis Nipper, MD 07/08/2023

## 2023-07-17 ENCOUNTER — Other Ambulatory Visit: Payer: Self-pay | Admitting: Student

## 2023-07-17 DIAGNOSIS — I10 Essential (primary) hypertension: Secondary | ICD-10-CM

## 2023-08-04 ENCOUNTER — Ambulatory Visit: Payer: 59 | Admitting: Student

## 2023-08-04 NOTE — Progress Notes (Deleted)
    SUBJECTIVE:   CHIEF COMPLAINT / HPI:   [name] is a 72 year old female here for***.    PERTINENT  PMH / PSH: PAD, hypertension, T2DM  OBJECTIVE:   There were no vitals taken for this visit.  General: NAD, well appearing Cardiac: RRR Neuro: A&O Respiratory: normal WOB on RA. No wheezing or crackles on auscultation, good lung sounds throughout Extremities: Moving all 4 extremities equally    ASSESSMENT/PLAN:   No problem-specific Assessment & Plan notes found for this encounter.     Darral Dash, DO [facility]    {    This will disappear when note is signed, click to select method of visit    :1}

## 2023-08-12 ENCOUNTER — Other Ambulatory Visit: Payer: Self-pay | Admitting: Internal Medicine

## 2023-08-12 ENCOUNTER — Other Ambulatory Visit (HOSPITAL_COMMUNITY): Payer: Self-pay | Admitting: Student

## 2023-08-12 ENCOUNTER — Other Ambulatory Visit (HOSPITAL_COMMUNITY): Payer: Self-pay | Admitting: Psychiatry

## 2023-08-12 DIAGNOSIS — F419 Anxiety disorder, unspecified: Secondary | ICD-10-CM

## 2023-08-12 DIAGNOSIS — F330 Major depressive disorder, recurrent, mild: Secondary | ICD-10-CM

## 2023-08-22 ENCOUNTER — Encounter (HOSPITAL_COMMUNITY): Payer: Self-pay | Admitting: *Deleted

## 2023-08-22 ENCOUNTER — Emergency Department (HOSPITAL_COMMUNITY)
Admission: EM | Admit: 2023-08-22 | Discharge: 2023-08-22 | Disposition: A | Discharge status: Home / Self Care | Payer: 59

## 2023-08-22 ENCOUNTER — Other Ambulatory Visit: Payer: Self-pay

## 2023-08-22 DIAGNOSIS — H9202 Otalgia, left ear: Secondary | ICD-10-CM | POA: Diagnosis present

## 2023-08-22 DIAGNOSIS — Z91040 Latex allergy status: Secondary | ICD-10-CM | POA: Diagnosis not present

## 2023-08-22 NOTE — Discharge Instructions (Signed)
Follow-up with your doctor as needed.

## 2023-08-22 NOTE — ED Provider Notes (Signed)
[place] EMERGENCY DEPARTMENT AT [facility] Provider Note   CSN: [id number] Arrival date & time: 08/22/23  1525     History  Chief Complaint  Patient presents with   bug in ear    [name] is a 72 y.o. female.  Patient felt the presence of a bug in her left ear starting 6 days ago. She has not felt any movement since yesterday. Prior to that she felt movement and heard a "chirping" sound. No bleeding. No hearing changes.   The history is provided by the patient. No language interpreter was used.       Home Medications Prior to Admission medications   Medication Sig Start Date End Date Taking? Authorizing Provider  acetaminophen (TYLENOL) 500 MG tablet Take 1,000 mg by mouth every 8 (eight) hours as needed for moderate pain.    [provider]  Alcohol Swabs (ALCOHOL PADS) 70 % PADS SWAB TO cleanse AREA EVERY DAY pior TO blood glucose check 01/09/23   Shamleffer, Konrad Dolores, MD  alendronate (FOSAMAX) 70 MG tablet TAKE ONE TABLET BY MOUTH EVERY 7 DAYS with a full glass of water and on an empty stomach 01/22/23   Dameron, Nolberto Hanlon, DO  amLODipine (NORVASC) 10 MG tablet TAKE 1 Tablet BY MOUTH ONCE DAILY at bedtime 04/20/23   Dameron, Nolberto Hanlon, DO  atorvastatin (LIPITOR) 80 MG tablet TAKE 1 TABLET(80 MG) BY MOUTH DAILY 04/09/22   Dana Allan, MD  Blood Glucose Monitoring Suppl (ACCU-CHEK GUIDE ME) w/Device KIT 1 Device by Does not apply route daily in the afternoon. 09/03/22   Shamleffer, Konrad Dolores, MD  Blood Pressure Monitoring (BLOOD PRESSURE KIT) DEVI Please measure BP at same time each day 03/26/21   Dana Allan, MD  buPROPion (WELLBUTRIN XL) 300 MG 24 hr tablet Take 1 tablet (300 mg total) by mouth every morning. 07/08/23 10/06/23  Arfeen, Phillips Grout, MD  clobetasol ointment (TEMOVATE) 0.05 % APPLY TOPICALLY TO THE AFFECTED AREA TWICE DAILY 04/09/22   Dana Allan, MD  cloNIDine (CATAPRES) 0.2 MG tablet Take 1 tablet (0.2 mg total) by mouth at  bedtime. 03/27/23   Dameron, Nolberto Hanlon, DO  clotrimazole (LOTRIMIN) 1 % cream Apply 1 application. topically 2 (two) times daily. 03/04/22   Dana Allan, MD  Easy Comfort Lancets MISC Use as directed to check blood sugar IN THE EVENING 09/26/22   Shamleffer, Konrad Dolores, MD  erythromycin ophthalmic ointment Place a 1/2 inch ribbon of ointment into the lower eyelid. 08/08/22   Elayne Snare K, DO  ezetimibe (ZETIA) 10 MG tablet Take 1 tablet (10 mg total) by mouth daily. 01/22/23   Dameron, Nolberto Hanlon, DO  famotidine (PEPCID) 20 MG tablet Take 1 Tablet by mouth once daily 07/17/23   Dameron, Nolberto Hanlon, DO  fluticasone (FLONASE) 50 MCG/ACT nasal spray Place into the nose. 08/25/16   [provider]  gabapentin (NEURONTIN) 100 MG capsule TAKE 1 Capsule BY MOUTH TWICE DAILY 08/14/23   Dameron, Nolberto Hanlon, DO  glipiZIDE (GLUCOTROL) 5 MG tablet Take 1 tablet (5 mg total) by mouth daily before breakfast. 07/03/23   Shamleffer, Konrad Dolores, MD  glucose blood (ACCU-CHEK GUIDE) test strip Use as directed to check blood sugar EVERY DAY IN THE EVENING 08/13/23   Shamleffer, Konrad Dolores, MD  hydrochlorothiazide (HYDRODIURIL) 12.5 MG tablet TAKE ONE TABLET BY MOUTH EVERY DAY 07/17/23   Dameron, Nolberto Hanlon, DO  latanoprost (XALATAN) 0.005 % ophthalmic solution Place 1 drop into both eyes at bedtime. 06/27/20   [provider]  linaclotide (  LINZESS) 145 MCG CAPS capsule Take 1 capsule (145 mcg total) by mouth daily before breakfast. 09/24/22   Dameron, Nolberto Hanlon, DO  loratadine (CLARITIN REDITABS) 10 MG dissolvable tablet Take 1 tablet (10 mg total) by mouth daily. As needed for allergy symptoms 02/01/21   Moses Manners, MD  losartan (COZAAR) 50 MG tablet TAKE 1 Tablet BY MOUTH ONCE DAILY 05/04/23   Dameron, Nolberto Hanlon, DO  metFORMIN (GLUCOPHAGE) 1000 MG tablet Take 1 tablet (1,000 mg total) by mouth daily with breakfast. 07/03/23   Shamleffer, Konrad Dolores, MD  mirtazapine (REMERON) 30 MG tablet TAKE 1 TABLET(30  MG) BY MOUTH AT BEDTIME 07/08/23   Arfeen, Phillips Grout, MD  propranolol (INDERAL) 40 MG tablet TAKE ONE TABLET BY MOUTH TWICE DAILY 07/17/23   Dameron, Nolberto Hanlon, DO  spironolactone (ALDACTONE) 25 MG tablet Take 1 tablet (25 mg total) by mouth at bedtime. 01/22/23   Dameron, Nolberto Hanlon, DO      Allergies    Aspirin, Penicillins, and Latex    Review of Systems   Review of Systems  Physical Exam Updated Vital Signs BP (!) 200/77 (BP Location: Right Arm)   Pulse (!) 54   Temp 99.2 F (37.3 C) (Oral)   Resp 18   Ht 5\' 1"  (1.549 m)   Wt 69.9 kg   SpO2 100%   BMI 29.12 kg/m  Physical Exam Vitals and nursing note reviewed.  Constitutional:      Appearance: Normal appearance.  HENT:     Right Ear: Tympanic membrane and ear canal normal.     Left Ear: Tympanic membrane and ear canal normal.     Ears:     Comments: No foreign body in left ear canal.  Neurological:     Mental Status: She is alert.     ED Results / Procedures / Treatments   Labs (all labs ordered are listed, but only abnormal results are displayed) Labs Reviewed - No data to display  EKG None  Radiology No results found.  Procedures Procedures    Medications Ordered in ED Medications - No data to display  ED Course/ Medical Decision Making/ A&P Clinical Course as of 08/22/23 1552  Sat Aug 22, 2023  1552 No insect visualized in the left ear. No middle ear effusion, redness of the TM, swelling of the canal.  [SU]    Clinical Course User Index [SU] Elpidio Anis, PA-C                                 Medical Decision Making          Final Clinical Impression(s) / ED Diagnoses Final diagnoses:  Left ear pain    Rx / DC Orders ED Discharge Orders     None         Danne Harbor 08/22/23 1552    Durwin Glaze, MD 08/22/23 2009

## 2023-08-22 NOTE — ED Triage Notes (Signed)
The pt reports that she has had a bug in her ear since last Sunday.  She woke up and when she went to the br she heard a buzzing in her lt ear.  She did not see any bug she is calm and in no distress

## 2023-08-24 ENCOUNTER — Ambulatory Visit (INDEPENDENT_AMBULATORY_CARE_PROVIDER_SITE_OTHER): Payer: 59 | Admitting: Student

## 2023-08-24 VITALS — BP 160/80 | HR 59 | Temp 97.7°F | Ht 61.0 in | Wt 155.8 lb

## 2023-08-24 DIAGNOSIS — N1831 Chronic kidney disease, stage 3a: Secondary | ICD-10-CM | POA: Diagnosis not present

## 2023-08-24 DIAGNOSIS — Z1211 Encounter for screening for malignant neoplasm of colon: Secondary | ICD-10-CM

## 2023-08-24 DIAGNOSIS — R1900 Intra-abdominal and pelvic swelling, mass and lump, unspecified site: Secondary | ICD-10-CM

## 2023-08-24 DIAGNOSIS — Z7984 Long term (current) use of oral hypoglycemic drugs: Secondary | ICD-10-CM

## 2023-08-24 NOTE — Progress Notes (Signed)
    SUBJECTIVE:   CHIEF COMPLAINT / HPI:   [name] is a 72 year-old female here with loss of appetite and shakiness.  Had an episode of confusion, sweating, feeling unwell for a few hours. She had just started taking Glipizide and had not yet had anything to eat.  Does check her blood sugars at home, only in the evenings- she wrote them down and forgot to bring her log with her.  Has not eaten anything today so far- has been struggling with her appetite for years. Last colonoscopy in 2011, says she is due for another one. No nausea, vomiting.  Quit smoking about 4 years ago. No hx CVA, MI, TIA.  PERTINENT  PMH / PSH: Open Ex Lap with perforation of duodenal ulcer with pertitonitis   OBJECTIVE:   There were no vitals taken for this visit. ***   ASSESSMENT/PLAN:   No problem-specific Assessment & Plan notes found for this encounter.   TIA vs hypoglycemic  Darral Dash, DO [facility]    {    This will disappear when note is signed, click to select method of visit    :1}

## 2023-08-24 NOTE — Patient Instructions (Signed)
It was great seeing you today.  I am worried about your loss of appetite and episode that you had the other day. -Please stop taking your glipizide for now.  It is dangerous to take this medication if you have not eaten because you can get very low blood sugars -Check your blood sugars over the next 3 days every morning and night and send me a log of the readings -I ordered a CT scan for your abdomen, you will be called to schedule this please do this is soon as possible.   If you have any questions or concerns, please feel free to call the clinic.   Have a wonderful day,  Dr. Darral Dash [facility] [phone]

## 2023-08-24 NOTE — Progress Notes (Deleted)
    SUBJECTIVE:   CHIEF COMPLAINT / HPI:   [name] is a 72 year old female here for***.    PERTINENT  PMH / PSH: PAD, hypertension, T2DM  OBJECTIVE:   There were no vitals taken for this visit.  General: NAD, well appearing Cardiac: RRR Neuro: A&O Respiratory: normal WOB on RA. No wheezing or crackles on auscultation, good lung sounds throughout Extremities: Moving all 4 extremities equally    ASSESSMENT/PLAN:   No problem-specific Assessment & Plan notes found for this encounter.     Darral Dash, DO [facility]    {    This will disappear when note is signed, click to select method of visit    :1}

## 2023-08-26 DIAGNOSIS — R1900 Intra-abdominal and pelvic swelling, mass and lump, unspecified site: Secondary | ICD-10-CM | POA: Insufficient documentation

## 2023-08-26 NOTE — Assessment & Plan Note (Signed)
Given her history of duodenal ulcer perforation with peritonitis, and concerning findings on exam with palpable firmness concerning for mass, will obtain stat CT abdomen/pelvis with and without contrast No concern for small bowel obstruction (having regular bowel movements) Does not seem fitting with abdominal hernia ED and return precautions discussed Close follow-up in the next 1 to 2 weeks

## 2023-08-26 NOTE — Assessment & Plan Note (Signed)
Her symptoms of diaphoresis, confusion in the setting of recently starting glipizide are concerning for hypoglycemic episode especially given low p.o. intake. -For now, discontinue glipizide (especially if she is not going to be eating regularly) -Encourage follow-up with endocrinologist -She is to take a log of her glucose levels, and bring this to her follow-up visit in 1 week.

## 2023-09-15 ENCOUNTER — Other Ambulatory Visit: Payer: Self-pay | Admitting: Student

## 2023-09-15 DIAGNOSIS — K5900 Constipation, unspecified: Secondary | ICD-10-CM

## 2023-09-29 ENCOUNTER — Ambulatory Visit: Payer: 59 | Admitting: Podiatry

## 2023-10-07 ENCOUNTER — Encounter (HOSPITAL_COMMUNITY): Payer: Self-pay

## 2023-10-07 ENCOUNTER — Telehealth (HOSPITAL_BASED_OUTPATIENT_CLINIC_OR_DEPARTMENT_OTHER): Payer: Self-pay | Admitting: Psychiatry

## 2023-10-07 DIAGNOSIS — Z91199 Patient's noncompliance with other medical treatment and regimen due to unspecified reason: Secondary | ICD-10-CM

## 2023-10-07 NOTE — Progress Notes (Signed)
   Complete physical exam  Patient: [name]   DOB: 08/16/2000   72 y.o. Female  MRN: [id number]  Subjective:    No chief complaint on file.   [name] is a 72 y.o. female who presents today for a complete physical exam. She reports consuming a {diet types:17450} diet. {types:19826} She generally feels {DESC; WELL/FAIRLY WELL/POORLY:18703}. She reports sleeping {DESC; WELL/FAIRLY WELL/POORLY:18703}. She {does/does not:200015} have additional problems to discuss today.    Most recent fall risk assessment:    04/23/2022   10:42 AM  Fall Risk   Falls in the past year? 0  Number falls in past yr: 0  Injury with Fall? 0  Risk for fall due to : No Fall Risks  Follow up Falls evaluation completed     Most recent depression screenings:    04/23/2022   10:42 AM 03/14/2021   10:46 AM  PHQ 2/9 Scores  PHQ - 2 Score 0 0  PHQ- 9 Score 5     {VISON DENTAL STD PSA (Optional):27386}  {History (Optional):23778}  Patient Care Team: Jessup, Joy, NP as PCP - General (Nurse Practitioner)   Outpatient Medications Prior to Visit  Medication Sig   fluticasone (FLONASE) 50 MCG/ACT nasal spray Place 2 sprays into both nostrils in the morning and at bedtime. After 7 days, reduce to once daily.   norgestimate-ethinyl estradiol (SPRINTEC 28) 0.25-35 MG-MCG tablet Take 1 tablet by mouth daily.   Nystatin POWD Apply liberally to affected area 2 times per day   spironolactone (ALDACTONE) 100 MG tablet Take 1 tablet (100 mg total) by mouth daily.   No facility-administered medications prior to visit.    ROS        Objective:     There were no vitals taken for this visit. {Vitals History (Optional):23777}  Physical Exam   No results found for any visits on 05/29/22. {Show previous labs (optional):23779}    Assessment & Plan:    Routine Health Maintenance and Physical Exam  Immunization History  Administered Date(s) Administered   DTaP 10/30/1999, 12/26/1999,  03/05/2000, 11/19/2000, 06/04/2004   Hepatitis A 03/31/2008, 04/06/2009   Hepatitis B 08/17/1999, 09/24/1999, 03/05/2000   HiB (PRP-OMP) 10/30/1999, 12/26/1999, 03/05/2000, 11/19/2000   IPV 10/30/1999, 12/26/1999, 08/24/2000, 06/04/2004   Influenza,inj,Quad PF,6+ Mos 07/07/2014   Influenza-Unspecified 10/06/2012   MMR 08/24/2001, 06/04/2004   Meningococcal Polysaccharide 04/05/2012   Pneumococcal Conjugate-13 11/19/2000   Pneumococcal-Unspecified 03/05/2000, 05/19/2000   Tdap 04/05/2012   Varicella 08/24/2000, 03/31/2008    Health Maintenance  Topic Date Due   HIV Screening  Never done   Hepatitis C Screening  Never done   INFLUENZA VACCINE  05/27/2022   PAP-Cervical Cytology Screening  05/29/2022 (Originally 08/15/2020)   PAP SMEAR-Modifier  05/29/2022 (Originally 08/15/2020)   TETANUS/TDAP  05/29/2022 (Originally 04/05/2022)   HPV VACCINES  Discontinued   COVID-19 Vaccine  Discontinued    Discussed health benefits of physical activity, and encouraged her to engage in regular exercise appropriate for her age and condition.  Problem List Items Addressed This Visit   None Visit Diagnoses     Annual physical exam    -  Primary   Cervical cancer screening       Need for Tdap vaccination          No follow-ups on file.     Joy Jessup, NP   

## 2023-10-12 ENCOUNTER — Other Ambulatory Visit: Payer: Self-pay | Admitting: Student

## 2023-10-12 ENCOUNTER — Other Ambulatory Visit (HOSPITAL_COMMUNITY): Payer: Self-pay | Admitting: Psychiatry

## 2023-10-12 DIAGNOSIS — F330 Major depressive disorder, recurrent, mild: Secondary | ICD-10-CM

## 2023-10-12 DIAGNOSIS — F419 Anxiety disorder, unspecified: Secondary | ICD-10-CM

## 2023-10-12 DIAGNOSIS — M8580 Other specified disorders of bone density and structure, unspecified site: Secondary | ICD-10-CM

## 2023-10-13 ENCOUNTER — Ambulatory Visit: Payer: 59 | Admitting: Podiatry

## 2023-10-15 ENCOUNTER — Encounter: Payer: Self-pay | Admitting: Internal Medicine

## 2023-10-20 ENCOUNTER — Encounter (HOSPITAL_COMMUNITY): Payer: Self-pay

## 2023-10-20 ENCOUNTER — Telehealth (HOSPITAL_BASED_OUTPATIENT_CLINIC_OR_DEPARTMENT_OTHER): Payer: Self-pay | Admitting: Psychiatry

## 2023-10-20 DIAGNOSIS — Z91199 Patient's noncompliance with other medical treatment and regimen due to unspecified reason: Secondary | ICD-10-CM

## 2023-10-20 NOTE — Progress Notes (Signed)
Patient no show for today`s appointment. I called [phone] and left voicemail to reschedule appointment.

## 2023-11-17 ENCOUNTER — Ambulatory Visit: Payer: 59 | Admitting: Internal Medicine

## 2023-11-17 NOTE — Progress Notes (Deleted)
Name: [name]  MRN/ DOB: [id number], November 15, 1950   Age/ Sex: 73 y.o., female    PCP: Darral Dash, DO   Reason for Endocrinology Evaluation: Type 2 Diabetes Mellitus     Date of Initial Endocrinology Visit: 09/03/2022    PATIENT IDENTIFIER: [name] is a 73 y.o. female with a past medical history of DM, HTN, COPD and dyslipidemia. The patient presented for initial endocrinology clinic visit on 09/03/2022 for consultative assistance with her diabetes management.    HPI: [name] was    Diagnosed with DM years ago  Prior Medications tried/Intolerance: Stopped Jaridnace 05/2022 due to weight loss  Hemoglobin A1c has ranged from 6.2% in 2020, peaking at 10.6% in 2023.  On initial visit to our clinic she had an A1c of 6.7%, she was on metformin only, patient was not interested in any weight loss medications, patient remained on metformin but we decreased the dose due to a low GFR <45   Started glipizide 06/2023  SUBJECTIVE:   During the last visit (07/03/2023): A1c 8.9%  Today (11/17/23): [name] is here for follow-up on diabetes management.  She checks her blood sugars occasionally . The patient has not had hypoglycemic episodes   Denies nausea or vomiting  Denies constipation or diarrhea    HOME DIABETES REGIMEN: Metformin 1000 mg daily  Glipizide 5 mg daily     Statin: yes ACE-I/ARB: yes   METER DOWNLOAD SUMMARY: Does not check    DIABETIC COMPLICATIONS: Microvascular complications:  Neuropathy Denies: CKD, retinopathy Last eye exam: Completed 05/2022  Macrovascular complications:   Denies: CAD, PVD, CVA   PAST HISTORY: Past Medical History:  Past Medical History:  Diagnosis Date   Adenomatous colon polyp    Allergy    Allergy to environmental factors 02/09/2020   Anxiety    Asthma    Back pain with radiation    radiopathy    Bilateral carpal tunnel syndrome 10/27/2010   Qualifier: Diagnosis of  By: Lodema Hong  MD, [name]     Cataract    FORMING    Cellulitis of left upper extremity 02/09/2020   COPD (chronic obstructive pulmonary disease) (HCC)    Corneal abrasion, left 04/28/2013   Denture sore mouth 08/29/2021   Patient reports that she cannot wear her top dentures due to soreness in her mouth. She reports that this has been going on since her upper teeth were extracted. She requests referral to oral surgeon.   Depression    Diabetes mellitus without complication (HCC)    ON METFORMIN    Dizziness 09/07/2018   GERD (gastroesophageal reflux disease)    Glaucoma    History of tobacco abuse 08/18/2008   HSV-2 (herpes simplex virus 2) infection 02/29/2016   Dx 2014, + serology   Hx of migraines    Hyperlipidemia    Hyperlipidemia associated with type 2 diabetes mellitus (HCC) 05/17/2012   Hypertension    on meds   Itching in the vaginal area 05/17/2015   Nicotine addiction    PANIC ATTACK 11/05/2007   PELVIC  PAIN 08/13/2010   Plantar wart of right foot 09/14/2019   Snoring 08/29/2021   Type 2 diabetes mellitus with hyperglycemia (HCC) 11/22/2021   Urinary incontinence    Well controlled type 2 diabetes mellitus (HCC) 02/24/2018   Past Surgical History:  Past Surgical History:  Procedure Laterality Date   ABDOMINAL HYSTERECTOMY     bleeding ulcer repair  1992   Open Ex Lap with perforation  of duodenal ulcer with pertitonitis   CHOLECYSTECTOMY  1999   COLONOSCOPY     HIATAL HERNIA REPAIR  1967   left ankle surgery  2008   POLYPECTOMY     removal of mild ducts from left breast  1982   SIMPLE MASTECTOMY Bilateral 06/04/2010   TOTAL ABDOMINAL HYSTERECTOMY W/ BILATERAL SALPINGOOPHORECTOMY  1989   TUBAL LIGATION  1975, 1978   x 2    Social History:  reports that she quit smoking about 6 years ago. Her smoking use included cigarettes. She started smoking about 48 years ago. She has a 10.5 pack-year smoking history. She has never used smokeless tobacco. She reports that she does not drink  alcohol and does not use drugs. Family History:  Family History  Problem Relation Age of Onset   Heart disease Mother    Diabetes Mother    Lupus Mother    Colon polyps Mother    Tuberculosis Father    Depression Sister    Diabetes Brother    Colon polyps Maternal Aunt    Heart disease Maternal Grandmother    Heart attack Maternal Aunt    Stomach cancer Sister    Endometriosis Daughter    Diabetes Daughter    Colon cancer Neg Hx    Esophageal cancer Neg Hx    Pancreatic cancer Neg Hx    Rectal cancer Neg Hx      HOME MEDICATIONS: Allergies as of 11/17/2023       Reactions   Aspirin Other (See Comments)   Dr told pt not to take asa d/t bleeding ulcer in 1985.   Penicillins Nausea Only   Tablets only.  Denies any airway involvement.     Latex Rash   Relieved with over the counter medications.        Medication List        Accurate as of November 17, 2023 11:41 AM. If you have any questions, ask your nurse or doctor.          Accu-Chek Guide Me w/Device Kit 1 Device by Does not apply route daily in the afternoon.   Accu-Chek Guide test strip Generic drug: glucose blood Use as directed to check blood sugar EVERY DAY IN THE EVENING   acetaminophen 500 MG tablet Commonly known as: TYLENOL Take 1,000 mg by mouth every 8 (eight) hours as needed for moderate pain.   Alcohol Pads 70 % Pads SWAB TO cleanse AREA EVERY DAY pior TO blood glucose check   alendronate 70 MG tablet Commonly known as: FOSAMAX TAKE 1 TABLET BY MOUTH EVERY 7 DAY IN THE MORNING WITH A FULL GLASS OF WATER ON AN EMPTY STOMACH. DO NOT TAKE ANYTHING ELSE OR LIE DOWN FOR 30 MINUTES.   amLODipine 10 MG tablet Commonly known as: NORVASC TAKE 1 Tablet BY MOUTH ONCE DAILY at bedtime   atorvastatin 80 MG tablet Commonly known as: LIPITOR TAKE 1 TABLET(80 MG) BY MOUTH DAILY   Blood Pressure Kit Devi Please measure BP at same time each day   buPROPion 300 MG 24 hr tablet Commonly known as:  Wellbutrin XL Take 1 tablet (300 mg total) by mouth every morning.   clobetasol ointment 0.05 % Commonly known as: TEMOVATE APPLY TOPICALLY TO THE AFFECTED AREA TWICE DAILY   cloNIDine 0.2 MG tablet Commonly known as: CATAPRES take 1 Tablet by mouth at bedtime   clotrimazole 1 % cream Commonly known as: LOTRIMIN Apply 1 application. topically 2 (two) times daily.   Easy Comfort  Lancets Misc Use as directed to check blood sugar IN THE EVENING   erythromycin ophthalmic ointment Place a 1/2 inch ribbon of ointment into the lower eyelid.   ezetimibe 10 MG tablet Commonly known as: ZETIA TAKE 1 Tablet BY MOUTH ONCE DAILY   famotidine 20 MG tablet Commonly known as: PEPCID Take 1 Tablet by mouth once daily   fluticasone 50 MCG/ACT nasal spray Commonly known as: FLONASE Place into the nose.   gabapentin 100 MG capsule Commonly known as: NEURONTIN TAKE 1 Capsule BY MOUTH TWICE DAILY   glipiZIDE 5 MG tablet Commonly known as: GLUCOTROL Take 1 tablet (5 mg total) by mouth daily before breakfast.   hydrochlorothiazide 12.5 MG tablet Commonly known as: HYDRODIURIL TAKE ONE TABLET BY MOUTH EVERY DAY   latanoprost 0.005 % ophthalmic solution Commonly known as: XALATAN Place 1 drop into both eyes at bedtime.   Linzess 145 MCG Caps capsule Generic drug: linaclotide TAKE 1 Capsule BY MOUTH ONCE DAILY BEFORE breakfast   loratadine 10 MG dissolvable tablet Commonly known as: CLARITIN REDITABS Take 1 tablet (10 mg total) by mouth daily. As needed for allergy symptoms   losartan 50 MG tablet Commonly known as: COZAAR TAKE 1 Tablet BY MOUTH ONCE DAILY   metFORMIN 1000 MG tablet Commonly known as: GLUCOPHAGE Take 1 tablet (1,000 mg total) by mouth daily with breakfast.   mirtazapine 30 MG tablet Commonly known as: REMERON TAKE 1 TABLET(30 MG) BY MOUTH AT BEDTIME   propranolol 40 MG tablet Commonly known as: INDERAL TAKE ONE TABLET BY MOUTH TWICE DAILY    spironolactone 25 MG tablet Commonly known as: ALDACTONE TAKE 1 Tablet BY MOUTH ONCE DAILY at bedtime         ALLERGIES: Allergies  Allergen Reactions   Aspirin Other (See Comments)    Dr told pt not to take asa d/t bleeding ulcer in 1985.   Penicillins Nausea Only    Tablets only.  Denies any airway involvement.     Latex Rash    Relieved with over the counter medications.     REVIEW OF SYSTEMS: A comprehensive ROS was conducted with the patient and is negative except as per HPI   OBJECTIVE:   VITAL SIGNS: There were no vitals taken for this visit.   PHYSICAL EXAM:  General: Pt appears well and is in NAD  Neck: General: Supple without adenopathy or carotid bruits. Thyroid: Thyroid size normal.  No goiter or nodules appreciated.   Lungs: Clear with good BS bilat with no rales, rhonchi, or wheezes  Heart: RRR   Abdomen:  soft, nontender  Extremities:  Lower extremities - No pretibial edema.   Neuro: MS is good with appropriate affect, pt is alert and Ox3     Dm Foot Exam 07/03/2023   The skin of the feet is intact without sores or ulcerations. The pedal pulses are 1+ on right and 1+ on left. The sensation is intact to a screening 5.07, 10 gram monofilament bilaterally   DATA REVIEWED:  Lab Results  Component Value Date   HGBA1C 8.9 (A) 07/03/2023   HGBA1C 8.7 (A) 03/09/2023   HGBA1C 6.7 (A) 09/03/2022    In office BG 212 mg/DL  Latest Reference Range & Units 04/06/23 17:42  Sodium 134 - 144 mmol/L 144  Potassium 3.5 - 5.2 mmol/L 4.1  Chloride 96 - 106 mmol/L 106  CO2 20 - 29 mmol/L 22  Glucose 70 - 99 mg/dL 295 (H)  BUN 8 - 27 mg/dL 13  Creatinine 0.57 - 1.00 mg/dL 0.86 (H)  Calcium 8.7 - 10.3 mg/dL 57.8  BUN/Creatinine Ratio 12 - 28  10 (L)  eGFR >59 mL/min/1.73 45 (L)  Phosphorus 3.0 - 4.3 mg/dL 2.6 (L)  Albumin 3.8 - 4.8 g/dL 4.5    Old records , labs and images have been reviewed.   ASSESSMENT / PLAN / RECOMMENDATIONS:   1) Type 2  Diabetes Mellitus, Poorly  controlled, With Neuropathic , CKD III complications - Most recent A1c of 8.9 %. Goal A1c < 7.0 %.     -A1c has trended up from 6.7% to 8.9% -She used to be on Jardiance, she discontinued this due to weight loss.  Today I revisited Jardiance due to cardiovascular and renal benefits, but she would like to avoid it - She is NOT interested in any weight loss medications -We decreased metformin by 50% due to  GFR <45, will reduce metformin -I have recommended sulfonylurea, emphasized the importance of taking this 15-20 minutes before the first meal of the day, which in her case is dinner, caution against hypoglycemia -In office BG 212 mg/DL, patient drank Gatorade.  Advised patient to avoid all sugar sweetened beverages  MEDICATIONS: Continue metformin 1000 mg 1 tablet daily Start glipizide 5 mg daily  EDUCATION / INSTRUCTIONS: BG monitoring instructions: Patient is instructed to check her blood sugars 1 times a day, fasting. Call [facility] [facility] if: BG persistently < 70  I reviewed the Rule of 15 for the treatment of hypoglycemia in detail with the patient. Literature supplied.   2) Diabetic complications:  Eye: Does not have known diabetic retinopathy.  Neuro/ Feet: Does  have known diabetic peripheral neuropathy. Renal: Patient does have known baseline CKD. She is  on an ACEI/ARB at present.   Follow-up in 4 months   Signed electronically by: Lyndle Herrlich, MD  [facility]  [facility] [address] [place] [place], [place] [address] Phone: [phone] FAX: [phone]   CC: Darral Dash, DO [address] [place] [address] Phone: [phone]  Fax: [phone]    Return to Endocrinology clinic as below: Future Appointments  Date Time Provider Department Center  11/17/2023  2:20 PM Lonny Eisen, Konrad Dolores, MD [facility] None  11/26/2023 10:45 AM Helane Gunther, DPM [facility]  12/09/2023  1:00 PM [facility] PREVISIT RM 50 [facility]  12/23/2023 11:00 AM Pyrtle, Carie Caddy, MD [facility]  06/10/2024  1:00 PM [facility] ANNUAL WELLNESS VISIT [facility]

## 2023-11-26 ENCOUNTER — Encounter: Payer: Self-pay | Admitting: Podiatry

## 2023-11-26 ENCOUNTER — Ambulatory Visit (INDEPENDENT_AMBULATORY_CARE_PROVIDER_SITE_OTHER): Payer: 59 | Admitting: Podiatry

## 2023-11-26 DIAGNOSIS — E119 Type 2 diabetes mellitus without complications: Secondary | ICD-10-CM

## 2023-11-26 DIAGNOSIS — B351 Tinea unguium: Secondary | ICD-10-CM

## 2023-11-26 DIAGNOSIS — M79674 Pain in right toe(s): Secondary | ICD-10-CM | POA: Diagnosis not present

## 2023-11-26 DIAGNOSIS — M79675 Pain in left toe(s): Secondary | ICD-10-CM

## 2023-11-26 NOTE — Progress Notes (Signed)

## 2023-11-30 ENCOUNTER — Ambulatory Visit: Payer: 59 | Admitting: Student

## 2023-12-08 ENCOUNTER — Ambulatory Visit: Payer: 59 | Admitting: Student

## 2023-12-09 ENCOUNTER — Ambulatory Visit: Payer: 59

## 2023-12-09 ENCOUNTER — Telehealth: Payer: Self-pay

## 2023-12-09 NOTE — Telephone Encounter (Addendum)
RN attempted to reach patient for pre-visit appointment, using both available phone numbers in patient chart. There was no answer at either number. RN left message, asking patient to call back today to complete pre-visit or we will need to cancel the upcoming procedure.   Patient did not return call. RN mailed letter to patient, notifying him of his appointment being cancelled.

## 2023-12-15 ENCOUNTER — Emergency Department (HOSPITAL_COMMUNITY)
Admission: EM | Admit: 2023-12-15 | Discharge: 2023-12-15 | Discharge status: Against Medical Advice | Payer: 59 | Attending: Emergency Medicine | Admitting: Emergency Medicine

## 2023-12-15 ENCOUNTER — Other Ambulatory Visit: Payer: Self-pay

## 2023-12-15 ENCOUNTER — Encounter (HOSPITAL_COMMUNITY): Payer: Self-pay | Admitting: Emergency Medicine

## 2023-12-15 ENCOUNTER — Emergency Department (HOSPITAL_COMMUNITY): Payer: 59

## 2023-12-15 DIAGNOSIS — R918 Other nonspecific abnormal finding of lung field: Secondary | ICD-10-CM | POA: Diagnosis not present

## 2023-12-15 DIAGNOSIS — I10 Essential (primary) hypertension: Secondary | ICD-10-CM | POA: Diagnosis not present

## 2023-12-15 DIAGNOSIS — R531 Weakness: Secondary | ICD-10-CM | POA: Diagnosis not present

## 2023-12-15 DIAGNOSIS — R5383 Other fatigue: Secondary | ICD-10-CM | POA: Insufficient documentation

## 2023-12-15 DIAGNOSIS — Z5321 Procedure and treatment not carried out due to patient leaving prior to being seen by health care provider: Secondary | ICD-10-CM | POA: Insufficient documentation

## 2023-12-15 DIAGNOSIS — R059 Cough, unspecified: Secondary | ICD-10-CM | POA: Diagnosis not present

## 2023-12-15 DIAGNOSIS — R0602 Shortness of breath: Secondary | ICD-10-CM | POA: Diagnosis not present

## 2023-12-15 LAB — RESP PANEL BY RT-PCR (RSV, FLU A&B, COVID)  RVPGX2
Influenza A by PCR: NEGATIVE
Influenza B by PCR: NEGATIVE
Resp Syncytial Virus by PCR: NEGATIVE
SARS Coronavirus 2 by RT PCR: NEGATIVE

## 2023-12-15 LAB — CBC
HCT: 41.8 % (ref 36.0–46.0)
Hemoglobin: 13.2 g/dL (ref 12.0–15.0)
MCH: 27.7 pg (ref 26.0–34.0)
MCHC: 31.6 g/dL (ref 30.0–36.0)
MCV: 87.8 fL (ref 80.0–100.0)
Platelets: 410 10*3/uL — ABNORMAL HIGH (ref 150–400)
RBC: 4.76 MIL/uL (ref 3.87–5.11)
RDW: 14 % (ref 11.5–15.5)
WBC: 6.7 10*3/uL (ref 4.0–10.5)
nRBC: 0 % (ref 0.0–0.2)

## 2023-12-15 LAB — COMPREHENSIVE METABOLIC PANEL
ALT: 49 U/L — ABNORMAL HIGH (ref 0–44)
AST: 47 U/L — ABNORMAL HIGH (ref 15–41)
Albumin: 3.8 g/dL (ref 3.5–5.0)
Alkaline Phosphatase: 77 U/L (ref 38–126)
Anion gap: 12 (ref 5–15)
BUN: 13 mg/dL (ref 8–23)
CO2: 19 mmol/L — ABNORMAL LOW (ref 22–32)
Calcium: 9.1 mg/dL (ref 8.9–10.3)
Chloride: 107 mmol/L (ref 98–111)
Creatinine, Ser: 1.21 mg/dL — ABNORMAL HIGH (ref 0.44–1.00)
GFR, Estimated: 47 mL/min — ABNORMAL LOW (ref >60)
Glucose, Bld: 159 mg/dL — ABNORMAL HIGH (ref 70–99)
Potassium: 4.1 mmol/L (ref 3.5–5.1)
Sodium: 138 mmol/L (ref 135–145)
Total Bilirubin: 0.6 mg/dL (ref 0.0–1.2)
Total Protein: 7.5 g/dL (ref 6.5–8.1)

## 2023-12-15 MED ORDER — ALBUTEROL SULFATE HFA 108 (90 BASE) MCG/ACT IN AERS: 2.0000 | INHALATION_SPRAY | RESPIRATORY_TRACT | Status: DC | PRN

## 2023-12-15 NOTE — ED Notes (Signed)
 Patient transported to X-ray

## 2023-12-15 NOTE — ED Triage Notes (Signed)
 Pt BIB by EMS for cough x 2 weeks. Reports finding blood in sputum x 3 days. Denies any CP or SHOB. Pt also endorses generalized weakness/fatigue.

## 2023-12-15 NOTE — ED Provider Triage Note (Signed)
 Emergency Medicine Provider Triage Evaluation Note  QUINCI GAVIDIA , a 73 y.o. female  was evaluated in triage.  Pt complains of cough.  Started 3 weeks ago.  Cough is productive with yellow-green sputum.  Has noticed in the last 3 days some blood-tinged sputum.  Denies chest pain or shortness of breath.  Review of Systems  Positive: See above Negative: See above  Physical Exam  BP (!) 151/122 (BP Location: Left Arm)   Pulse 79   Temp 98.5 F (36.9 C) (Oral)   Resp 20   SpO2 100%  Gen:   Awake, no distress   Resp:  Normal effort  MSK:   Moves extremities without difficulty  Other:    Medical Decision Making  Medically screening exam initiated at 8:12 PM.  Appropriate orders placed.  Charlese S Minor was informed that the remainder of the evaluation will be completed by another provider, this initial triage assessment does not replace that evaluation, and the importance of remaining in the ED until their evaluation is complete.  Work up started   Gareth Eagle, Cordelia Poche 12/15/23 2013

## 2023-12-15 NOTE — ED Notes (Signed)
Patient decided to leave AMA

## 2023-12-23 ENCOUNTER — Encounter: Payer: 59 | Admitting: Internal Medicine

## 2024-01-05 ENCOUNTER — Telehealth (HOSPITAL_BASED_OUTPATIENT_CLINIC_OR_DEPARTMENT_OTHER): Payer: 59 | Admitting: Psychiatry

## 2024-01-05 ENCOUNTER — Other Ambulatory Visit: Payer: Self-pay | Admitting: Student

## 2024-01-05 ENCOUNTER — Encounter (HOSPITAL_COMMUNITY): Payer: Self-pay | Admitting: Psychiatry

## 2024-01-05 VITALS — Wt 155.0 lb

## 2024-01-05 DIAGNOSIS — F419 Anxiety disorder, unspecified: Secondary | ICD-10-CM | POA: Diagnosis not present

## 2024-01-05 DIAGNOSIS — F330 Major depressive disorder, recurrent, mild: Secondary | ICD-10-CM | POA: Diagnosis not present

## 2024-01-05 DIAGNOSIS — I10 Essential (primary) hypertension: Secondary | ICD-10-CM

## 2024-01-05 MED ORDER — GABAPENTIN 300 MG PO CAPS: 300.0000 mg | ORAL_CAPSULE | Freq: Every day | ORAL | 0 refills | Status: DC

## 2024-01-05 MED ORDER — BUPROPION HCL ER (XL) 300 MG PO TB24: 300.0000 mg | ORAL_TABLET | ORAL | 0 refills | Status: DC

## 2024-01-05 MED ORDER — MIRTAZAPINE 30 MG PO TABS: ORAL_TABLET | ORAL | 0 refills | Status: DC

## 2024-01-05 NOTE — Progress Notes (Signed)
 BH MD/PA/NP OP Progress Note  Virtual Visit via Video Note  I connected with [name] on 01/05/24 at 11:30 AM EDT by a video enabled telemedicine application and verified that I am speaking with the correct person using two identifiers.  Location: Patient: Home Provider: Home Office   I discussed the limitations of evaluation and management by telemedicine and the availability of in person appointments. The patient expressed understanding and agreed to proceed.  01/05/2024 11:31 AM [name]  MRN:  [id number]  Chief Complaint:  Chief Complaint  Patient presents with   Follow-up   Medication Refill   HPI: Patient is evaluated by video session.  She had missed 2 previous appointment because she mention phone was not working.  She reported chronic anxiety and not sleeping very well.  She feels anxious and dysphoria.  She has a lot of chronic health issues and sometimes she feels they are not addressed very well by her primary care.  She visited emergency room recently for shortness of breath but left the hospital emergency room after waiting too long.  Patient has limited support system.  Her daughter moved to [facility] and she does not compress frequently.  Patient does not want to engage with the neighbors.  When she needs to go to doctors visit all the pharmacy she has to get taxi.  She had called few times social services for transportation but due to her phone not working not sure if they called her back or not.  She reported poor sleep, racing thoughts, anxiety but denies any suicidal thoughts or homicidal thoughts.  She denies any paranoia or any hallucination.  She is taking gabapentin 200 mg at bedtime prescribed by PCP to help anxiety which helps some in the beginning but now she feels immune to the medication.  She is taking mirtazapine at night and Wellbutrin in the morning.  She has no tremor or shakes or any EPS.  She has chronic pain in her shoulder and foot.   Sometimes she uses walker but admitted not every day as she is mostly confined to her place.  Her appetite is fair.  Her weight is unchanged from the past.  She denies any drinking or using any illegal substances.  Recent blood work shows mild improvement in creatinine but no hemoglobin A1c was drawn.  She has appointment coming up with primary care in few days.  Visit Diagnosis:    ICD-10-CM   1. Major depressive disorder, recurrent episode, mild (HCC)  F33.0 buPROPion (WELLBUTRIN XL) 300 MG 24 hr tablet    mirtazapine (REMERON) 30 MG tablet    2. Anxiety  F41.9 buPROPion (WELLBUTRIN XL) 300 MG 24 hr tablet    gabapentin (NEURONTIN) 300 MG capsule      Past Psychiatric History: Reviewed H/O inpatient treatment at [facility] to detox from Xanax.  Saw Psychiatrist in [place], [place] and [facility].  Tried Paxil, Prozac, vistaril and Zoloft.  No h/o suicidal attempt or any psychosis.   Past Medical History:  Past Medical History:  Diagnosis Date   Adenomatous colon polyp    Allergy    Allergy to environmental factors 02/09/2020   Anxiety    Asthma    Back pain with radiation    radiopathy    Bilateral carpal tunnel syndrome 10/27/2010   Qualifier: Diagnosis of  By: Lodema Hong MD, [name]     Cataract    FORMING    Cellulitis of left upper extremity  02/09/2020   COPD (chronic obstructive pulmonary disease) (HCC)    Corneal abrasion, left 04/28/2013   Denture sore mouth 08/29/2021   Patient reports that she cannot wear her top dentures due to soreness in her mouth. She reports that this has been going on since her upper teeth were extracted. She requests referral to oral surgeon.   Depression    Diabetes mellitus without complication (HCC)    ON METFORMIN    Dizziness 09/07/2018   GERD (gastroesophageal reflux disease)    Glaucoma    History of tobacco abuse 08/18/2008   HSV-2 (herpes simplex virus 2) infection 02/29/2016   Dx 2014, + serology   Hx of  migraines    Hyperlipidemia    Hyperlipidemia associated with type 2 diabetes mellitus (HCC) 05/17/2012   Hypertension    on meds   Itching in the vaginal area 05/17/2015   Nicotine addiction    PANIC ATTACK 11/05/2007   PELVIC  PAIN 08/13/2010   Plantar wart of right foot 09/14/2019   Snoring 08/29/2021   Type 2 diabetes mellitus with hyperglycemia (HCC) 11/22/2021   Urinary incontinence    Well controlled type 2 diabetes mellitus (HCC) 02/24/2018    Past Surgical History:  Procedure Laterality Date   ABDOMINAL HYSTERECTOMY     bleeding ulcer repair  1992   Open Ex Lap with perforation of duodenal ulcer with pertitonitis   CHOLECYSTECTOMY  1999   COLONOSCOPY     HIATAL HERNIA REPAIR  1967   left ankle surgery  2008   POLYPECTOMY     removal of mild ducts from left breast  1982   SIMPLE MASTECTOMY Bilateral 06/04/2010   TOTAL ABDOMINAL HYSTERECTOMY W/ BILATERAL SALPINGOOPHORECTOMY  1989   TUBAL LIGATION  1975, 1978   x 2    Family Psychiatric History: Reviewed  Family History:  Family History  Problem Relation Age of Onset   Heart disease Mother    Diabetes Mother    Lupus Mother    Colon polyps Mother    Tuberculosis Father    Depression Sister    Diabetes Brother    Colon polyps Maternal Aunt    Heart disease Maternal Grandmother    Heart attack Maternal Aunt    Stomach cancer Sister    Endometriosis Daughter    Diabetes Daughter    Colon cancer Neg Hx    Esophageal cancer Neg Hx    Pancreatic cancer Neg Hx    Rectal cancer Neg Hx     Social History:  Social History   Socioeconomic History   Marital status: Legally Separated    Spouse name: Not on file   Number of children: 3   Years of education: 12   Highest education level: High school graduate  Occupational History   Occupation: retired   Occupation: Retired  Tobacco Use   Smoking status: Former    Current packs/day: 0.00    Average packs/day: 0.3 packs/day for 42.0 years (10.5 ttl pk-yrs)     Types: Cigarettes    Start date: 07/07/1975    Quit date: 07/06/2017    Years since quitting: 6.5   Smokeless tobacco: Never  Vaping Use   Vaping status: Never Used  Substance and Sexual Activity   Alcohol use: No    Alcohol/week: 0.0 standard drinks of alcohol   Drug use: No   Sexual activity: Not Currently    Birth control/protection: Post-menopausal  Other Topics Concern   Not on file  Social History Narrative  Patient lives alone in [place].    Patient uses public transportation.    Patient enjoys reading and watching detective shows.    Social Drivers of Corporate investment banker Strain: Low Risk  (06/06/2023)   Overall Financial Resource Strain (CARDIA)    Difficulty of Paying Living Expenses: Not hard at all  Food Insecurity: No Food Insecurity (06/06/2023)   Hunger Vital Sign    Worried About Running Out of Food in the Last Year: Never true    Ran Out of Food in the Last Year: Never true  Transportation Needs: Unmet Transportation Needs (06/06/2023)   PRAPARE - Transportation    Lack of Transportation (Medical): No    Lack of Transportation (Non-Medical): Yes  Physical Activity: Unknown (06/06/2023)   Exercise Vital Sign    Days of Exercise per Week: Not on file    Minutes of Exercise per Session: 0 min  Stress: No Stress Concern Present (06/06/2023)   [employer] of Occupational Health - Occupational Stress Questionnaire    Feeling of Stress : Not at all  Social Connections: Socially Isolated (06/06/2023)   Social Connection and Isolation Panel [NHANES]    Frequency of Communication with Friends and Family: More than three times a week    Frequency of Social Gatherings with Friends and Family: Three times a week    Attends Religious Services: Never    Active Member of Clubs or Organizations: No    Attends Banker Meetings: Never    Marital Status: Divorced    Allergies:  Allergies  Allergen Reactions   Aspirin Other (See Comments)    Dr  told pt not to take asa d/t bleeding ulcer in 1985.   Penicillins Nausea Only    Tablets only.  Denies any airway involvement.     Latex Rash    Relieved with over the counter medications.    Metabolic Disorder Labs: Lab Results  Component Value Date   HGBA1C 8.9 (A) 07/03/2023   MPG 126 (H) 01/20/2012   No results found for: "PROLACTIN" Lab Results  Component Value Date   CHOL 113 03/09/2023   TRIG 98 03/09/2023   HDL 34 (L) 03/09/2023   CHOLHDL 3.3 03/09/2023   VLDL 26 09/04/2016   LDLCALC 60 03/09/2023   LDLCALC 84 01/21/2022   Lab Results  Component Value Date   TSH 0.880 03/26/2021   TSH 0.56 09/04/2016    Therapeutic Level Labs: No results found for: "LITHIUM" Lab Results  Component Value Date   VALPROATE <4 (L) 03/26/2021   VALPROATE 50 07/06/2019   No results found for: "CBMZ"  Current Medications: Current Outpatient Medications  Medication Sig Dispense Refill   acetaminophen (TYLENOL) 500 MG tablet Take 1,000 mg by mouth every 8 (eight) hours as needed for moderate pain.     Alcohol Swabs (ALCOHOL PADS) 70 % PADS SWAB TO cleanse AREA EVERY DAY pior TO blood glucose check 100 each 11   alendronate (FOSAMAX) 70 MG tablet TAKE 1 TABLET BY MOUTH EVERY 7 DAY IN THE MORNING WITH A FULL GLASS OF WATER ON AN EMPTY STOMACH. DO NOT TAKE ANYTHING ELSE OR LIE DOWN FOR 30 MINUTES. 4 tablet 6   amLODipine (NORVASC) 10 MG tablet TAKE 1 Tablet BY MOUTH ONCE DAILY at bedtime 90 tablet 1   atorvastatin (LIPITOR) 80 MG tablet TAKE 1 TABLET(80 MG) BY MOUTH DAILY 90 tablet 3   Blood Glucose Monitoring Suppl (ACCU-CHEK GUIDE ME) w/Device KIT 1 Device by Does not apply  route daily in the afternoon. 1 kit 0   Blood Pressure Monitoring (BLOOD PRESSURE KIT) DEVI Please measure BP at same time each day 1 each 0   buPROPion (WELLBUTRIN XL) 300 MG 24 hr tablet Take 1 tablet (300 mg total) by mouth every morning. 90 tablet 0   clobetasol ointment (TEMOVATE) 0.05 % APPLY TOPICALLY TO THE  AFFECTED AREA TWICE DAILY 30 g 2   cloNIDine (CATAPRES) 0.2 MG tablet take 1 Tablet by mouth at bedtime 90 tablet 3   clotrimazole (LOTRIMIN) 1 % cream Apply 1 application. topically 2 (two) times daily. 60 g 2   Easy Comfort Lancets MISC Use as directed to check blood sugar IN THE EVENING 100 each PRN   erythromycin ophthalmic ointment Place a 1/2 inch ribbon of ointment into the lower eyelid. 3.5 g 0   ezetimibe (ZETIA) 10 MG tablet TAKE 1 Tablet BY MOUTH ONCE DAILY 90 tablet 2   famotidine (PEPCID) 20 MG tablet Take 1 Tablet by mouth once daily 90 tablet 2   fluticasone (FLONASE) 50 MCG/ACT nasal spray Place into the nose.     gabapentin (NEURONTIN) 300 MG capsule Take 1 capsule (300 mg total) by mouth at bedtime. 90 capsule 0   glipiZIDE (GLUCOTROL) 5 MG tablet Take 1 tablet (5 mg total) by mouth daily before breakfast. 90 tablet 3   glucose blood (ACCU-CHEK GUIDE) test strip Use as directed to check blood sugar EVERY DAY IN THE EVENING 100 strip 11   hydrochlorothiazide (HYDRODIURIL) 12.5 MG tablet TAKE ONE TABLET BY MOUTH EVERY DAY 90 tablet 2   latanoprost (XALATAN) 0.005 % ophthalmic solution Place 1 drop into both eyes at bedtime.     LINZESS 145 MCG CAPS capsule TAKE 1 Capsule BY MOUTH ONCE DAILY BEFORE breakfast 30 capsule 11   loratadine (CLARITIN REDITABS) 10 MG dissolvable tablet Take 1 tablet (10 mg total) by mouth daily. As needed for allergy symptoms 31 tablet 12   losartan (COZAAR) 50 MG tablet TAKE 1 Tablet BY MOUTH ONCE DAILY 90 tablet 2   metFORMIN (GLUCOPHAGE) 1000 MG tablet Take 1 tablet (1,000 mg total) by mouth daily with breakfast. 90 tablet 3   mirtazapine (REMERON) 30 MG tablet TAKE 1 TABLET(30 MG) BY MOUTH AT BEDTIME 90 tablet 0   propranolol (INDERAL) 40 MG tablet TAKE ONE TABLET BY MOUTH TWICE DAILY 180 tablet 2   spironolactone (ALDACTONE) 25 MG tablet TAKE 1 Tablet BY MOUTH ONCE DAILY at bedtime 90 tablet 2   No current facility-administered medications for this  visit.     Musculoskeletal: Strength & Muscle Tone: within normal limits Gait & Station:  sometimes use walker to help balance Patient leans: N/A  Psychiatric Specialty Exam: Review of Systems  Respiratory:  Positive for cough.   Musculoskeletal:  Positive for neck pain.       Foot pain  Psychiatric/Behavioral:  Positive for dysphoric mood. The patient is nervous/anxious.     Weight 155 lb (70.3 kg).There is no height or weight on file to calculate BMI.  General Appearance: Casual  Eye Contact:  Fair  Speech:  Slow  Volume:  Decreased  Mood:  Anxious and Dysphoric  Affect:  Depressed  Thought Process:  Goal Directed  Orientation:  Full (Time, Place, and Person)  Thought Content: Rumination   Suicidal Thoughts:  No  Homicidal Thoughts:  No  Memory:  Immediate;   Fair Recent;   Fair Remote;   Fair  Judgement:  Fair  Insight:  Present  Psychomotor Activity:  Decreased  Concentration:  Concentration: Fair and Attention Span: Fair  Recall:  Fair  Fund of Knowledge: Good  Language: Good  Akathisia:  No  Handed:  Right  AIMS (if indicated): not done  Assets:  Communication Skills Desire for Improvement Housing  ADL's:  Intact  Cognition: WNL  Sleep:  Fair   Screenings: PHQ2-9    Flowsheet Row Clinical Support from 06/05/2023 in [facility] - A Dept Of [place]. [facility] Office Visit from 04/06/2023 in [facility] - A Dept Of [place]. [facility] Office Visit from 03/09/2023 in [facility] - A Dept Of Eligha Bridegroom. [facility] Office Visit from 02/23/2023 in [facility] - A Dept Of Eligha Bridegroom. [facility] Office Visit from 04/08/2022 in [facility] - A Dept Of Eligha Bridegroom. [facility]  PHQ-2 Total Score 0 0 0 2 2  PHQ-9 Total Score 0 0 0 10 9      Flowsheet Row ED from 12/15/2023 in [facility] Emergency Department at [facility] ED from 08/22/2023  in [facility] Emergency Department at [facility] ED from 08/07/2022 in [facility] Emergency Department at [facility]  C-SSRS RISK CATEGORY No Risk No Risk No Risk        Assessment and Plan: Patient is 74 year old female with multiple health illness including hyperlipidemia, type 2 diabetes mellitus, chronic pain, hypertension and peripheral artery disease and migraine.  Discussed psychosocial stressors and lack of support.  Patient has no transportation.  Encourage to contact social services for transportation which she did but not sure because of phone not working received any phone call.  We have recommended to take the gabapentin 200 mg at bedtime which is prescribed by PCP.  She noticed some improvement.  I offered if she can try 300 mg at bedtime gabapentin to help her insomnia and anxiety.  She agreed to give a try.  Will continue Wellbutrin XL 300 mg in morning and mirtazapine 30 mg at bedtime.  Encouraged to keep appointment for future refills.  Patient not interested in therapy.  Reviewed blood work results with her.  Encouraged to keep appointment with primary care to have blood work for hemoglobin A1c and other labs.  Patient will try gabapentin 300 mg at bedtime to help sleep anxiety and insomnia.  Recommend to call us back if she has any question or any concern.  Follow-up in 3 months.  Collaboration of Care: Collaboration of Care: Other provider involved in patient's care AEB notes are available in epic to review  Patient/Guardian was advised Release of Information must be obtained prior to any record release in order to collaborate their care with an outside provider. Patient/Guardian was advised if they have not already done so to contact the registration department to sign all necessary forms in order for [place] to release information regarding their care.   Consent: Patient/Guardian gives verbal consent for treatment and assignment of benefits for services provided  during this visit. Patient/Guardian expressed understanding and agreed to proceed.    Follow Up Instructions:    I discussed the assessment and treatment plan with the patient. The patient was provided an opportunity to ask questions and all were answered. The patient agreed with the plan and demonstrated an understanding of the instructions.   The patient was advised to call back or seek an in-person  evaluation if the symptoms worsen or if the condition fails to improve as anticipated.  I provided 24 minutes of non-face-to-face time during this encounter.  Cleotis Nipper, MD 01/05/2024, 11:31 AM

## 2024-01-06 ENCOUNTER — Ambulatory Visit: Admitting: Student

## 2024-01-29 ENCOUNTER — Encounter: Payer: Self-pay | Admitting: Student

## 2024-01-29 ENCOUNTER — Ambulatory Visit (INDEPENDENT_AMBULATORY_CARE_PROVIDER_SITE_OTHER): Admitting: Student

## 2024-01-29 VITALS — BP 120/68 | HR 63 | Ht 61.0 in | Wt 155.4 lb

## 2024-01-29 DIAGNOSIS — E1169 Type 2 diabetes mellitus with other specified complication: Secondary | ICD-10-CM | POA: Diagnosis not present

## 2024-01-29 DIAGNOSIS — L989 Disorder of the skin and subcutaneous tissue, unspecified: Secondary | ICD-10-CM | POA: Insufficient documentation

## 2024-01-29 DIAGNOSIS — Z23 Encounter for immunization: Secondary | ICD-10-CM | POA: Diagnosis not present

## 2024-01-29 DIAGNOSIS — N1831 Chronic kidney disease, stage 3a: Secondary | ICD-10-CM

## 2024-01-29 DIAGNOSIS — E1122 Type 2 diabetes mellitus with diabetic chronic kidney disease: Secondary | ICD-10-CM

## 2024-01-29 LAB — POCT GLYCOSYLATED HEMOGLOBIN (HGB A1C): HbA1c, POC (controlled diabetic range): 7.5 % — AB (ref 0.0–7.0)

## 2024-01-29 LAB — POCT SKIN KOH: Skin KOH, POC: NEGATIVE

## 2024-01-29 NOTE — Assessment & Plan Note (Signed)
 A1c at goal today, 7.5%. Continue Glipizide 5 mg daily Continue Metformin 1000 mg daily Praised patient on her dietary changes F/u in 3 months for A1c July 2025

## 2024-01-29 NOTE — Progress Notes (Signed)
    SUBJECTIVE:   CHIEF COMPLAINT / HPI:   [name] is a 73 year-old female here with skin concern.  Skin concern -2 round flaky lesions, one on the right wrist and one on the right posterior thigh., has been there for 3 months - She is worried that something in the water is causing it -She has put "a little bit of everything" on it regarding topical creams, ointments, tec. -No fevers. No changes in medicine, foods, detergents. -Tried calling around to different dermatologists but they could not see her for 6 months or more.  T2DM -A1c today 7.5% -Taking glipizide 5 mg daily -Taking Metformin 1000 mg daily -Drinking water, no bread or sodas.  PERTINENT  PMH / PSH:  COPD PAD T2DM Hx depression  OBJECTIVE:   BP 120/68   Pulse 63   Ht 5\' 1"  (1.549 m)   Wt 155 lb 6 oz (70.5 kg)   SpO2 98%   BMI 29.36 kg/m   General: NAD, elderly female, no distress HEENT: Poor dentition Cardiac: RRR Neuro: A&O Respiratory: normal WOB on RA. No wheezing or crackles on auscultation, good lung sounds throughout Skin: Right wrist with flaking,  Posterior thigh with rough surface, loss of skin lines, point area of hypopigmentation and hyperpigmentation.    ASSESSMENT/PLAN:   Type 2 diabetes mellitus with stage 3a chronic kidney disease, without long-term current use of insulin (HCC) A1c at goal today, 7.5%. Continue Glipizide 5 mg daily Continue Metformin 1000 mg daily Praised patient on her dietary changes F/u in 3 months for A1c July 2025  Skin lesion 2 circular, flaky lesions 1 on wrist and 1 on thigh Unclear etiology at this time Advised patient to stop using topical treatments as she may be causing more irritation Completed KOH skin sample today Schedule patient for return follow-up for biopsy if no improvement     Darral Dash, DO [facility]

## 2024-01-29 NOTE — Assessment & Plan Note (Signed)
 2 circular, flaky lesions 1 on wrist and 1 on thigh Unclear etiology at this time Advised patient to stop using topical treatments as she may be causing more irritation Completed KOH skin sample today Schedule patient for return follow-up for biopsy if no improvement

## 2024-01-29 NOTE — Patient Instructions (Addendum)
 It was great seeing you today.  As we discussed, -We took a sample of your skin lesion today. -Have scheduled you back with me for a skin biopsy -Your blood pressure is well-controlled -Your diabetes is well-controlled   If you have any questions or concerns, please feel free to call the clinic.   Have a wonderful day,  Dr. Darral Dash [facility] [phone]

## 2024-02-11 ENCOUNTER — Ambulatory Visit: Payer: Self-pay | Admitting: Student

## 2024-02-18 ENCOUNTER — Encounter: Payer: Self-pay | Admitting: Student

## 2024-02-18 ENCOUNTER — Ambulatory Visit (INDEPENDENT_AMBULATORY_CARE_PROVIDER_SITE_OTHER): Admitting: Student

## 2024-02-18 VITALS — BP 104/70 | HR 55 | Ht 61.0 in | Wt 160.0 lb

## 2024-02-18 DIAGNOSIS — Z59819 Housing instability, housed unspecified: Secondary | ICD-10-CM | POA: Diagnosis not present

## 2024-02-18 DIAGNOSIS — R0609 Other forms of dyspnea: Secondary | ICD-10-CM

## 2024-02-18 NOTE — Patient Instructions (Signed)
 It was great seeing you today.  As we discussed, - Ordered echocardiogram (they will call to schedule after insurance processes) - I placed referral to cardiology (they will call you to schedule in next 3-4 weeks)   If you have any questions or concerns, please feel free to call the clinic.   Have a wonderful day,  Dr. Vallorie Gayer [facility] [phone]

## 2024-02-18 NOTE — Progress Notes (Signed)
    SUBJECTIVE:   CHIEF COMPLAINT / HPI:   [name] is a 73 year-old female here to discuss dyspnea on exertion and fatigue.  Gets shortness of breath with laying flat She has to sleep with 4 pillows at night. Denies any lower extremity edema. Had a presyncopal event 2 nights ago when she was sitting down.  Last echocardiogram was in 2022 and LVEF 60-65%. No aortic stenosis.  Heart disease runs in the family- mother was 53 when she had a MI and passed away. Maternal aunt had MI. Biological sister had MI at age 69 and passed away from it.  Not smoking, quit >5 years ago.  PERTINENT  PMH / PSH:  T2DM COPD HTN   OBJECTIVE:   BP 104/70   Pulse (!) 55   Ht 5\' 1"  (1.549 m)   Wt 160 lb (72.6 kg)   SpO2 99%   BMI 30.23 kg/m   General: Chronically ill-appearing, no distress, ambulates unassisted CV: RRR Resp: Normal WOB on room air. No wheezing or crackles. Ext: No peripheral edema  ASSESSMENT/PLAN:   Dyspnea on exertion DOE and orthopnea Hemodynamically stable, normal SpO2 with ambulation and at rest today No signs of acute CHF exacerbation- lungs clear, no peripheral edema Last echo 2022 with normal EF, ordered echocardiogram today Strong family hx heart disease (Mother and sister both deceased from MI) Multiple risk factors for cardiac disease (prior smoker, age, T2DM, HLD, HTN) so referred to cardiology for further evaluation given possible anginal symptoms     Vallorie Gayer, DO [facility]

## 2024-02-18 NOTE — Assessment & Plan Note (Addendum)
 DOE and orthopnea No signs of acute CHF exacerbation- lungs clear, no peripheral edema Last echo 2022 with normal EF, ordered echocardiogram today Strong family hx heart disease (Mother and sister both deceased from MI) Multiple risk factors for cardiac disease (prior smoker, age, T2DM, HLD, HTN) so referred to cardiology for further evaluation given possible anginal symptoms

## 2024-02-19 ENCOUNTER — Telehealth: Payer: Self-pay | Admitting: *Deleted

## 2024-02-19 NOTE — Progress Notes (Signed)
 Complex Care Management Note  Care Guide Note 02/19/2024 Name: ESTELL PUCCINI MRN: [id number] DOB: February 11, 1951  Rudi Corwin Patlan is a 73 y.o. year old female who sees Dameron, Marisa, DO for primary care. I reached out to [facility] by phone today to offer complex care management services.  Ms. Grigg was given information about Complex Care Management services today including:   The Complex Care Management services include support from the care team which includes your Nurse Care Manager, Clinical Social Worker, or Pharmacist.  The Complex Care Management team is here to help remove barriers to the health concerns and goals most important to you. Complex Care Management services are voluntary, and the patient may decline or stop services at any time by request to their care team member.   Complex Care Management Consent Status: Patient agreed to services and verbal consent obtained.   Follow up plan:  Telephone appointment with complex care management team member scheduled for:  03/01/2024  Encounter Outcome:  Patient Scheduled  Kandis Ormond, CMA, [place]  [facility], [facility] Guide Direct Dial: [phone]  Fax: [phone] Website: [email or web address]

## 2024-02-19 NOTE — Progress Notes (Deleted)
 Complex Care Management Note Care Guide Note  02/19/2024 Name: [name] MRN: [id number] DOB: 11-01-1950   Complex Care Management Outreach Attempts: An unsuccessful telephone outreach was attempted today to offer the patient information about available complex care management services.  Follow Up Plan:  Additional outreach attempts will be made to offer the patient complex care management information and services.   Encounter Outcome:  No Answer  Kandis Ormond, CMA [place]  [facility], [facility] Guide Direct Dial: [phone]  Fax: [phone] Website: [email or web address]

## 2024-03-01 ENCOUNTER — Encounter: Payer: Self-pay | Admitting: Licensed Clinical Social Worker

## 2024-03-01 ENCOUNTER — Telehealth: Payer: Self-pay | Admitting: Licensed Clinical Social Worker

## 2024-03-03 ENCOUNTER — Telehealth: Payer: Self-pay | Admitting: *Deleted

## 2024-03-03 NOTE — Progress Notes (Signed)
 Complex Care Management Care Guide Note  03/03/2024 Name: [name] MRN: [id number] DOB: [birth year]  [name] is a 73 y.o. year old female who is a primary care patient of Dameron, Marisa, DO and is actively engaged with the care management team. I reached out to Delynn Fill by phone today to assist with re-scheduling  with the Licensed Clinical Child psychotherapist.  Follow up plan: Telephone appointment with complex care management team member scheduled for:  5/27  Barnie Bora  [facility]  [facility], [facility] Guide  Direct Dial: [phone]  Fax [phone]

## 2024-03-03 NOTE — Progress Notes (Signed)
 Complex Care Management Care Guide Note  03/03/2024 Name: TIONDRA HEROUX MRN: [id number] DOB: 1951/07/13  Rudi Corwin Mcgillis is a 73 y.o. year old female who is a primary care patient of Dameron, Marisa, DO and is actively engaged with the care management team. I reached out to Delynn Fill by phone today to assist with re-scheduling  with the Licensed Clinical Child psychotherapist.  Follow up plan: Unsuccessful telephone outreach attempt made. A HIPAA compliant phone message was left for the patient providing contact information and requesting a return call.  Barnie Bora  [facility]  [facility], [facility] Guide  Direct Dial: [phone]  Fax [phone]

## 2024-03-22 ENCOUNTER — Other Ambulatory Visit: Payer: Self-pay

## 2024-03-22 NOTE — Patient Instructions (Signed)
 Visit Information  Thank you for taking time to visit with me today. Please don't hesitate to contact me if I can be of assistance to you before your next scheduled appointment.  Our next appointment is by telephone on 04/07/2024 at 3:00 pm Please call the care guide team at [phone] if you need to cancel or reschedule your appointment.      Please call the Suicide and Crisis Lifeline: 988 call the [place]  National Suicide Prevention Lifeline: 580-828-3621 or TTY: (724)465-3122 TTY 709-333-9181) to talk to a trained counselor call 1-800-273-TALK (toll free, 24 hour hotline) go to [facility] [address] [place] [phone]) call 911 if you are experiencing a Mental Health or Behavioral Health Crisis or need someone to talk to.  Patient verbalizes understanding of instructions and care plan provided today and agrees to view in MyChart. Active MyChart status and patient understanding of how to access instructions and care plan via MyChart confirmed with patient.     Haven Lion, BSW [place]  [facility] Social Worker, [employer] [facility] [phone]

## 2024-03-22 NOTE — Patient Outreach (Signed)
 Complex Care Management   Visit Note  03/22/2024  Name:  [name] MRN: [id number] DOB: February 07, 1951  Situation: Referral received for Complex Care Management related to being rescheduled with LCSW. I obtained verbal consent from Patient.  Visit completed with patient  on the phone   Assessment:  BSW outreached patient to assist with SDOH barriers. No barriers were identified during phone call. BSW rescheduled patient with LCSW for initial call.  Recommendation:   No recommendations at this time.   Follow Up Plan:   Referral to Licensed Clinical Social Worker  Haven Lion, [facility]  [facility] Social Worker, [employer] [phone]

## 2024-03-24 ENCOUNTER — Ambulatory Visit: Payer: 59 | Admitting: Podiatry

## 2024-03-30 ENCOUNTER — Other Ambulatory Visit: Payer: Self-pay | Admitting: Student

## 2024-03-30 ENCOUNTER — Other Ambulatory Visit: Payer: Self-pay | Admitting: Internal Medicine

## 2024-03-30 DIAGNOSIS — I10 Essential (primary) hypertension: Secondary | ICD-10-CM

## 2024-04-01 ENCOUNTER — Ambulatory Visit (HOSPITAL_COMMUNITY)

## 2024-04-04 ENCOUNTER — Telehealth: Payer: Self-pay

## 2024-04-04 NOTE — Telephone Encounter (Signed)
 Received call from [facility] regarding rx refill request for Jardiance .   Pharmacist reports that patient last had medication filled on 02/23/24.  Per chart review, it appears that medication was discontinued.   Will forward to PCP for further clarification if patient is supposed to be taking medication.   Elsie Halo, RN

## 2024-04-05 ENCOUNTER — Encounter: Payer: Self-pay | Admitting: *Deleted

## 2024-04-06 ENCOUNTER — Telehealth (HOSPITAL_BASED_OUTPATIENT_CLINIC_OR_DEPARTMENT_OTHER): Admitting: Psychiatry

## 2024-04-06 ENCOUNTER — Other Ambulatory Visit: Payer: Self-pay | Admitting: Internal Medicine

## 2024-04-06 ENCOUNTER — Encounter (HOSPITAL_COMMUNITY): Payer: Self-pay | Admitting: Psychiatry

## 2024-04-06 DIAGNOSIS — F419 Anxiety disorder, unspecified: Secondary | ICD-10-CM

## 2024-04-06 DIAGNOSIS — F330 Major depressive disorder, recurrent, mild: Secondary | ICD-10-CM | POA: Diagnosis not present

## 2024-04-06 MED ORDER — MIRTAZAPINE 30 MG PO TABS: ORAL_TABLET | ORAL | 0 refills | Status: DC

## 2024-04-06 MED ORDER — BUPROPION HCL ER (XL) 300 MG PO TB24: 300.0000 mg | ORAL_TABLET | ORAL | 0 refills | Status: DC

## 2024-04-06 MED ORDER — GABAPENTIN 300 MG PO CAPS: 300.0000 mg | ORAL_CAPSULE | Freq: Every day | ORAL | 0 refills | Status: DC

## 2024-04-06 NOTE — Progress Notes (Signed)
 [facility] MD Virtual Progress Note   Patient Location: Home Provider Location: Home Office  I connect with patient by video and verified that I am speaking with correct person by using two identifiers. I discussed the limitations of evaluation and management by telemedicine and the availability of in person appointments. I also discussed with the patient that there may be a patient responsible charge related to this service. The patient expressed understanding and agreed to proceed.  [name] [id number] 73 y.o.  04/06/2024 10:43 AM  History of Present Illness:  Patient is evaluated by video session.  She is doing better today.  Now she is taking gabapentin  300 mg at bedtime but is helping her sleep and also helping her anxiety.  She is pleased that her insurance now allow to have 8 ride car and her daughter also helps since school is close.  She is sleeping better and that is helping her anxiety next day.  She is still not happy with her neighborhood.  Patient told a lot of drug addicts live there and her plan is to move out.  She is on [address] housing and it could be a challenging but with the help of her daughter she is going to start the process soon.  She denies any mania, psychosis, hallucination.  She denies any panic attack.  She denies any suicidal thoughts or any feeling of hopelessness or worthlessness.  She like to keep the current medication which is keeping her stable.  She has other health issues and she is going to see the doctor very soon.  Recently she had a blood work and hemoglobin A1c 7.5 and creatinine 1.21.  She like to reestablish care with endocrinologist.  Past Psychiatric History: H/O inpatient treatment at [facility] to detox from Xanax .  Saw Psychiatrist in [place], [place]  and [facility]  Tried Paxil, Prozac, vistaril  and Zoloft.  No h/o suicidal attempt or any psychosis.    Outpatient Encounter  Medications as of 04/06/2024  Medication Sig   acetaminophen  (TYLENOL ) 500 MG tablet Take 1,000 mg by mouth every 8 (eight) hours as needed for moderate pain.   Alcohol Swabs (ALCOHOL PADS) 70 % PADS SWAB TO cleanse AREA EVERY DAY pior TO blood glucose check   alendronate  (FOSAMAX ) 70 MG tablet TAKE 1 TABLET BY MOUTH EVERY 7 DAY IN THE MORNING WITH A FULL GLASS OF WATER ON AN EMPTY STOMACH. DO NOT TAKE ANYTHING ELSE OR LIE DOWN FOR 30 MINUTES.   amLODipine  (NORVASC ) 10 MG tablet TAKE 1 Tablet BY MOUTH ONCE DAILY at bedtime   atorvastatin  (LIPITOR) 80 MG tablet TAKE 1 TABLET(80 MG) BY MOUTH DAILY   Blood Glucose Monitoring Suppl (ACCU-CHEK GUIDE ME) w/Device KIT 1 Device by Does not apply route daily in the afternoon.   Blood Pressure Monitoring (BLOOD PRESSURE KIT) DEVI Please measure BP at same time each day   buPROPion  (WELLBUTRIN  XL) 300 MG 24 hr tablet Take 1 tablet (300 mg total) by mouth every morning.   clobetasol  ointment (TEMOVATE ) 0.05 % APPLY TOPICALLY TO THE AFFECTED AREA TWICE DAILY   cloNIDine  (CATAPRES ) 0.2 MG tablet take 1 Tablet by mouth at bedtime   clotrimazole  (LOTRIMIN ) 1 % cream Apply 1 application. topically 2 (two) times daily.   Easy Comfort Lancets MISC Use as directed to check blood sugar IN THE EVENING   erythromycin  ophthalmic ointment Place a 1/2 inch ribbon of ointment into the lower eyelid.   ezetimibe  (ZETIA ) 10  MG tablet TAKE 1 Tablet BY MOUTH ONCE DAILY   famotidine  (PEPCID ) 20 MG tablet Take 1 Tablet by mouth once daily   fluticasone  (FLONASE ) 50 MCG/ACT nasal spray Place into the nose.   gabapentin  (NEURONTIN ) 300 MG capsule Take 1 capsule (300 mg total) by mouth at bedtime.   glipiZIDE  (GLUCOTROL ) 5 MG tablet Take 1 tablet (5 mg total) by mouth daily before breakfast.   glucose blood (ACCU-CHEK GUIDE) test strip Use as directed to check blood sugar EVERY DAY IN THE EVENING   hydrochlorothiazide  (HYDRODIURIL ) 12.5 MG tablet TAKE ONE TABLET BY MOUTH EVERY DAY    latanoprost (XALATAN) 0.005 % ophthalmic solution Place 1 drop into both eyes at bedtime.   LINZESS  145 MCG CAPS capsule TAKE 1 Capsule BY MOUTH ONCE DAILY BEFORE breakfast   loratadine  (CLARITIN  REDITABS) 10 MG dissolvable tablet Take 1 tablet (10 mg total) by mouth daily. As needed for allergy symptoms   losartan  (COZAAR ) 50 MG tablet TAKE 1 Tablet BY MOUTH ONCE DAILY   metFORMIN  (GLUCOPHAGE ) 1000 MG tablet TAKE 1 Tablet BY MOUTH ONCE DAILY WITH BREAKFAST   mirtazapine  (REMERON ) 30 MG tablet TAKE 1 TABLET(30 MG) BY MOUTH AT BEDTIME   propranolol  (INDERAL ) 40 MG tablet TAKE ONE TABLET BY MOUTH TWICE DAILY   spironolactone  (ALDACTONE ) 25 MG tablet TAKE 1 Tablet BY MOUTH ONCE DAILY at bedtime   No facility-administered encounter medications on file as of 04/06/2024.    Recent Results (from the past 2160 hours)  HgB A1c     Status: Abnormal   Collection Time: 01/29/24  9:40 AM  Result Value Ref Range   Hemoglobin A1C     HbA1c POC (<> result, manual entry)     HbA1c, POC (prediabetic range)     HbA1c, POC (controlled diabetic range) 7.5 (A) 0.0 - 7.0 %  POCT Skin KOH     Status: None   Collection Time: 01/29/24 10:15 AM  Result Value Ref Range   Skin KOH, POC Negative Negative     Psychiatric Specialty Exam: Physical Exam  Review of Systems  Musculoskeletal:  Positive for back pain.       Foot pain    Weight 160 lb (72.6 kg).There is no height or weight on file to calculate BMI.  General Appearance: Casual  Eye Contact:  Good  Speech:  Slow  Volume:  Normal  Mood:  Dysphoric  Affect:  Congruent  Thought Process:  Descriptions of Associations: Intact  Orientation:  Full (Time, Place, and Person)  Thought Content:  Logical  Suicidal Thoughts:  No  Homicidal Thoughts:  No  Memory:  Immediate;   Fair Recent;   Fair Remote;   Fair  Judgement:  Intact  Insight:  Present  Psychomotor Activity:  Normal  Concentration:  Concentration: Fair and Attention Span: Fair  Recall:   Good  Fund of Knowledge:  Good  Language:  Good  Akathisia:  No  Handed:  Right  AIMS (if indicated):     Assets:  Communication Skills Desire for Improvement Housing Social Support  ADL's:  Intact  Cognition:  WNL  Sleep:  better       02/18/2024    2:16 PM 01/29/2024    9:45 AM 06/06/2023    8:18 PM 04/06/2023    3:21 PM 03/09/2023    3:16 PM  Depression screen PHQ 2/9  Decreased Interest 1 3 0 0 0  Down, Depressed, Hopeless 1 1 0 0 0  PHQ - 2 Score 2  4 0 0 0  Altered sleeping 1 3 0 0 0  Tired, decreased energy 3 3 0 0 0  Change in appetite 3 3 0 0 0  Feeling bad or failure about yourself  0 0 0 0 0  Trouble concentrating 0 0 0 0 0  Moving slowly or fidgety/restless 1 0 0 0 0  Suicidal thoughts 0 0 0 0 0  PHQ-9 Score 10 13 0 0 0  Difficult doing work/chores Very difficult Somewhat difficult Not difficult at all Not difficult at all Not difficult at all    Assessment/Plan: Major depressive disorder, recurrent episode, mild (HCC) - Plan: buPROPion  (WELLBUTRIN  XL) 300 MG 24 hr tablet, mirtazapine  (REMERON ) 30 MG tablet  Anxiety - Plan: buPROPion  (WELLBUTRIN  XL) 300 MG 24 hr tablet, gabapentin  (NEURONTIN ) 300 MG capsule  Patient is 73 year old female with multiple health issues including diabetes, hyperlipidemia, chronic pain, hypertension and peripheral artery disease and migraine.  We increase gabapentin  and she is feeling better.  She is not happy with her living situation and like to find a different neighborhood.  She is hoping since school is closed her daughter can come and help her out.  She is not interested in therapy.  Reviewed blood work results.  Hemoglobin A1c 7.5.  She like to establish care again with endocrinologist.  We will message and forward my note to her PCP.  Creatinine 1.21.  Continue gabapentin  300 mg at bedtime, mirtazapine  30 mg at bedtime and Wellbutrin  XL 300 mg daily.  Recommend to call us  back if she is any question or any concern.  Follow-up in 3  months.   Follow Up Instructions:     I discussed the assessment and treatment plan with the patient. The patient was provided an opportunity to ask questions and all were answered. The patient agreed with the plan and demonstrated an understanding of the instructions.   The patient was advised to call back or seek an in-person evaluation if the symptoms worsen or if the condition fails to improve as anticipated.    Collaboration of Care: Other provider involved in patient's care AEB notes are available in epic to review  Patient/Guardian was advised Release of Information must be obtained prior to any record release in order to collaborate their care with an outside provider. Patient/Guardian was advised if they have not already done so to contact the registration department to sign all necessary forms in order for us  to release information regarding their care.   Consent: Patient/Guardian gives verbal consent for treatment and assignment of benefits for services provided during this visit. Patient/Guardian expressed understanding and agreed to proceed.     Total encounter time 25 minutes which includes face-to-face time, chart reviewed, care coordination, order entry and documentation during this encounter.   Note: This document was prepared by [employer] voice dictation technology and any errors that results from this process are unintentional.    Arturo Late, MD 04/06/2024

## 2024-04-07 ENCOUNTER — Encounter: Payer: Self-pay | Admitting: Podiatry

## 2024-04-07 ENCOUNTER — Other Ambulatory Visit: Payer: Self-pay | Admitting: Licensed Clinical Social Worker

## 2024-04-07 ENCOUNTER — Ambulatory Visit (INDEPENDENT_AMBULATORY_CARE_PROVIDER_SITE_OTHER): Admitting: Podiatry

## 2024-04-07 DIAGNOSIS — I152 Hypertension secondary to endocrine disorders: Secondary | ICD-10-CM

## 2024-04-07 DIAGNOSIS — B351 Tinea unguium: Secondary | ICD-10-CM | POA: Diagnosis not present

## 2024-04-07 DIAGNOSIS — M79674 Pain in right toe(s): Secondary | ICD-10-CM | POA: Diagnosis not present

## 2024-04-07 DIAGNOSIS — G3184 Mild cognitive impairment, so stated: Secondary | ICD-10-CM

## 2024-04-07 DIAGNOSIS — M79675 Pain in left toe(s): Secondary | ICD-10-CM | POA: Diagnosis not present

## 2024-04-07 DIAGNOSIS — F411 Generalized anxiety disorder: Secondary | ICD-10-CM

## 2024-04-07 DIAGNOSIS — I739 Peripheral vascular disease, unspecified: Secondary | ICD-10-CM

## 2024-04-07 DIAGNOSIS — E1169 Type 2 diabetes mellitus with other specified complication: Secondary | ICD-10-CM

## 2024-04-07 DIAGNOSIS — J449 Chronic obstructive pulmonary disease, unspecified: Secondary | ICD-10-CM

## 2024-04-07 NOTE — Patient Instructions (Signed)
 Visit Information  Thank you for taking time to visit with me today. Please don't hesitate to contact me if I can be of assistance to you before our next scheduled appointment.  Our next appointment is by telephone on 04/21/24 at 3 PM Please call the care guide team at [phone] if you need to cancel or reschedule your appointment.   Following is a copy of your care plan:   Goals Addressed             This Visit's Progress    VBCI Social Work Care Plan       Problems:   ADL IADL limitations, Housing barriers, Limited social support, Mental Health Concerns , and Social Isolation, Lacks knowledge of how to connect , and Depression  , Housing , and Social Connections  CSW Clinical Goal(s):   Over the next 90 days the Patient will attend all scheduled medical appointments as evidenced by patient report and care team review of appointment completion in electronic MEDICAL RECORD NUMBERby VBCI LCSW. Pt will demonstrate a reduction in symptoms related to Anxiety with Excessive Worry, Panic Symptoms, Social Anxiety, Depression:  Patient will explore community resource options for unmet needs related to Housing, Depression, Anxiety and Social Connections.  Interventions:  Social Determinants of Health in Patient with Anxiety: SDOH assessments completed: Depression  , Financial Strain , Physical Activity, Social Connections, and Stress Evaluation of current treatment plan related to unmet needs Housing resources: Museum/gallery curator provided Mental Health:  Evaluation of current treatment plan related to Anxiety with Panic Symptoms, and Panic Disorder Active listening / Reflection utilized Mining engineer reviewed Financial risk analyst / information provided Depression screen reviewed Discussed referral options to connect for ongoing therapy: Pt agreeable to therapy referral  Emotional Support Provided Made referral to VBCI RNCM Mindfulness or Relaxation training  provided Motivational Interviewing employed PHQ2/PHQ9 completed Problem Solving /Task Center strategies reviewed Provided general psycho-education for mental health needs Reviewed mental health medications and discussed importance of compliance: Ongoing [place] [facility] involvement Solution-Focued Strategies employed: Suicidal Ideation/Homicidal Ideation assessed: No SI/HI  Patient Goals/Self-Care Activities:  Connect with provider for ongoing mental health treatment.   Continue taking your medication as prescribed.   Follow up with [employer]. Increase coping skills, healthy habits, self-management skills, and stress reduction  Plan:   The care management team will reach out to the patient again over the next 30 days. The patient has been provided with contact information for the care management team and has been advised to call with any health related questions or concerns.         Please call the Suicide and Crisis Lifeline: 988 call the [place]  National Suicide Prevention Lifeline: 484-705-5356 or TTY: (587) 527-2927 TTY 8025586900) to talk to a trained counselor go to [facility] [address] [place] [phone]) if you are experiencing a Mental Health or Behavioral Health Crisis or need someone to talk to.  Patient verbalizes understanding of instructions and care plan provided today and agrees to view in MyChart. Active MyChart status and patient understanding of how to access instructions and care plan via MyChart confirmed with patient.     Kolleen Perone, BSW, MSW, LCSW Licensed Clinical Social Worker [employer] [facility]   [email or web address] Direct Dial: [phone]

## 2024-04-07 NOTE — Patient Outreach (Signed)
 Complex Care Management   Visit Note  04/07/2024  Name:  [name] MRN: [id number] DOB: 09/30/1951  Situation: Referral received for Complex Care Management related to Mental/Behavioral Health diagnosis Anxiety/Depression and SDOH Barriers:  Transportation, Housing, Social Isolation, Depression I obtained verbal consent from Patient.  Visit completed with VBCI LCSW  on the phone  Background:   Past Medical History:  Diagnosis Date   Adenomatous colon polyp    Allergy    Allergy to environmental factors 02/09/2020   Anxiety    Asthma    Back pain with radiation    radiopathy    Bilateral carpal tunnel syndrome 10/27/2010   Qualifier: Diagnosis of  By: Rodolph Clap MD, [name]     Cataract    FORMING    Cellulitis of left upper extremity 02/09/2020   COPD (chronic obstructive pulmonary disease) (HCC)    Corneal abrasion, left 04/28/2013   Denture sore mouth 08/29/2021   Patient reports that she cannot wear her top dentures due to soreness in her mouth. She reports that this has been going on since her upper teeth were extracted. She requests referral to oral surgeon.   Depression    Diabetes mellitus without complication (HCC)    ON METFORMIN     Dizziness 09/07/2018   GERD (gastroesophageal reflux disease)    Glaucoma    History of tobacco abuse 08/18/2008   HSV-2 (herpes simplex virus 2) infection 02/29/2016   Dx 2014, + serology   Hx of migraines    Hyperlipidemia    Hyperlipidemia associated with type 2 diabetes mellitus (HCC) 05/17/2012   Hypertension    on meds   Itching in the vaginal area 05/17/2015   Nicotine  addiction    PANIC ATTACK 11/05/2007   PELVIC  PAIN 08/13/2010   Plantar wart of right foot 09/14/2019   Skin lesions 12/08/2013   Has undergone extensive however incomplete autoimmune workup that has been unrevealing at this time  Concern for sarcoidosis versus SLE versus other connective tissue disorder including drug related SLE like syndrome      Snoring 08/29/2021   Type 2 diabetes mellitus with hyperglycemia (HCC) 11/22/2021   Urinary incontinence    Well controlled type 2 diabetes mellitus (HCC) 02/24/2018    Assessment: Patient Reported Symptoms:  Cognitive Cognitive Status: Alert and oriented to person, place, and time, Normal speech and language skills, Able to follow simple commands Cognitive/Intellectual Conditions Management [RPT]: None reported or documented in medical history or problem list   Health Maintenance Behaviors: Annual physical exam Healing Pattern: Average Health Facilitated by: Rest  Neurological Neurological Review of Symptoms: No symptoms reported    HEENT HEENT Symptoms Reported: No symptoms reported      Cardiovascular Cardiovascular Symptoms Reported: No symptoms reported Does patient have uncontrolled Hypertension?: No Cardiovascular Conditions: Hypertension Cardiovascular Management Strategies: Medication therapy Cardiovascular Self-Management Outcome: 4 (good)  Respiratory Respiratory Symptoms Reported: No symptoms reported Respiratory Conditions: Asthma Respiratory Self-Management Outcome: 4 (good)  Endocrine Patient reports the following symptoms related to hypoglycemia or hyperglycemia : No symptoms reported Is patient diabetic?: Yes Is patient checking blood sugars at home?:  (DOES NOT KNOW HOW TO CHECK BLOOD SUGARS, VBCI RNCM REFERRAL PLACED) Endocrine Conditions: Diabetes Endocrine Management Strategies: Medication therapy Endocrine Self-Management Outcome: 3 (uncertain)  Gastrointestinal Gastrointestinal Symptoms Reported: No symptoms reported      Genitourinary Genitourinary Symptoms Reported: No symptoms reported Genitourinary Conditions: Incontinence  Integumentary Integumentary Symptoms Reported: Skin changes Additional Integumentary Details: Rash on right wrist since January of 2025  Skin Conditions: Other Other Skin Conditions: Rash Skin Management Strategies: Routine  screening Skin Self-Management Outcome: 2 (bad)  Musculoskeletal Musculoskelatal Symptoms Reviewed: Weakness Musculoskeletal Conditions: Mobility limited Musculoskeletal Management Strategies: Adequate rest Musculoskeletal Self-Management Outcome: 4 (good)      Psychosocial Psychosocial Symptoms Reported: Depression - if selected complete PHQ 2-9, Anxiety - if selected complete GAD Behavioral Health Conditions: Depression, Anxiety Behavioral Management Strategies: Adequate rest, Medication therapy Behavioral Health Self-Management Outcome: 2 (bad) Major Change/Loss/Stressor/Fears (CP): Environment Behaviors When Feeling Stressed/Fearful: Scared. Patient does not feel safe at [employer] Apartment complex Techniques to [employer] with Loss/Stress/Change: Withdraw Quality of Family Relationships: involved, helpful Do you feel physically threatened by others?: No      04/07/2024    3:18 PM  Depression screen PHQ 2/9  Decreased Interest 1  Down, Depressed, Hopeless 1  PHQ - 2 Score 2  Altered sleeping 1  Tired, decreased energy 2  Change in appetite 1  Feeling bad or failure about yourself  0  Trouble concentrating 0  Moving slowly or fidgety/restless 0  Suicidal thoughts 0  PHQ-9 Score 6  Difficult doing work/chores Somewhat difficult      04/07/2024    3:29 PM  GAD 7 : Generalized Anxiety Score  Nervous, Anxious, on Edge 1  Control/stop worrying 1  Worry too much - different things 1  Trouble relaxing 0  Restless 0  Easily annoyed or irritable 0  Afraid - awful might happen 1  Total GAD 7 Score 4  Anxiety Difficulty Somewhat difficult   There were no vitals filed for this visit.  Medications Reviewed Today     Reviewed by Elie Grove, LCSW (Social Worker) on 04/07/24 at 1456  Med List Status: <None>   Medication Order Taking? Sig Documenting Provider Last Dose Status Informant  acetaminophen  (TYLENOL ) 500 MG tablet [id number]  Take 1,000 mg by mouth every 8 (eight)  hours as needed for moderate pain. [provider]  Active Self  Alcohol Swabs (ALCOHOL PADS) 70 % PADS [id number]  SWAB TO cleanse AREA EVERY DAY prior TO blood glucose check Shamleffer, Ibtehal Jaralla, MD  Active   alendronate  (FOSAMAX ) 70 MG tablet [id number]  TAKE 1 TABLET BY MOUTH EVERY 7 DAY IN THE MORNING WITH A FULL GLASS OF WATER ON AN EMPTY STOMACH. DO NOT TAKE ANYTHING ELSE OR LIE DOWN FOR 30 MINUTES. Dameron, Marisa, DO  Active   amLODipine  (NORVASC ) 10 MG tablet [id number]  TAKE 1 Tablet BY MOUTH ONCE DAILY at bedtime Dameron, Marisa, DO  Active   atorvastatin  (LIPITOR) 80 MG tablet [id number]  TAKE 1 TABLET(80 MG) BY MOUTH DAILY Valli Gaw, MD  Active   Blood Glucose Monitoring Suppl (ACCU-CHEK GUIDE ME) w/Device KIT [id number]  1 Device by Does not apply route daily in the afternoon. Shamleffer, Julian Obey, MD  Active   Blood Pressure Monitoring (BLOOD PRESSURE KIT) DEVI [id number]  Please measure BP at same time each day Valli Gaw, MD  Active Self  buPROPion  (WELLBUTRIN  XL) 300 MG 24 hr tablet [id number]  Take 1 tablet (300 mg total) by mouth every morning. Arfeen, Syed T, MD  Active   clobetasol  ointment (TEMOVATE ) 0.05 % [id number]  APPLY TOPICALLY TO THE AFFECTED AREA TWICE DAILY Valli Gaw, MD  Active   cloNIDine  (CATAPRES ) 0.2 MG tablet [id number]  take 1 Tablet by mouth at bedtime Dameron, Marisa, DO  Active   clotrimazole  (LOTRIMIN ) 1 % cream [id number]  Apply 1 application. topically 2 (two) times daily. Valli Gaw, MD  Active   Easy Comfort Lancets MISC [id number]  Use as directed to check blood sugar IN THE EVENING Shamleffer, Ibtehal Jaralla, MD  Active   erythromycin  ophthalmic ointment [id number]  Place a 1/2 inch ribbon of ointment into the lower eyelid. Kingsley, Victoria K, DO  Active   ezetimibe  (ZETIA ) 10 MG tablet [id number]  TAKE 1 Tablet BY MOUTH ONCE DAILY Dameron, Marisa, DO  Active   famotidine  (PEPCID ) 20 MG tablet [id number]  Take 1 Tablet by mouth  once daily Dameron, Marisa, DO  Active   fluticasone  (FLONASE ) 50 MCG/ACT nasal spray [id number]  Place into the nose. [provider]  Active   gabapentin  (NEURONTIN ) 300 MG capsule [id number]  Take 1 capsule (300 mg total) by mouth at bedtime. Arfeen, Syed T, MD  Active   glipiZIDE  (GLUCOTROL ) 5 MG tablet [id number]  Take 1 tablet (5 mg total) by mouth daily before breakfast. Shamleffer, Ibtehal Jaralla, MD  Active   glucose blood (ACCU-CHEK GUIDE) test strip [id number]  Use as directed to check blood sugar EVERY DAY IN THE EVENING Shamleffer, Ibtehal Jaralla, MD  Active   hydrochlorothiazide  (HYDRODIURIL ) 12.5 MG tablet [id number]  TAKE ONE TABLET BY MOUTH EVERY DAY Dameron, Marisa, DO  Active   latanoprost (XALATAN) 0.005 % ophthalmic solution [id number]  Place 1 drop into both eyes at bedtime. [provider]  Active Self  LINZESS  145 MCG CAPS capsule [id number]  TAKE 1 Capsule BY MOUTH ONCE DAILY BEFORE breakfast Dameron, Raymona Caldwell, DO  Active   loratadine  (CLARITIN  REDITABS) 10 MG dissolvable tablet [id number]  Take 1 tablet (10 mg total) by mouth daily. As needed for allergy symptoms Hensel, Azucena Bollard, MD  Active Self  losartan  (COZAAR ) 50 MG tablet [id number]  TAKE 1 Tablet BY MOUTH ONCE DAILY Dameron, Marisa, DO  Active   metFORMIN  (GLUCOPHAGE ) 1000 MG tablet [id number]  TAKE 1 Tablet BY MOUTH ONCE DAILY WITH BREAKFAST Shamleffer, Ibtehal Jaralla, MD  Active   mirtazapine  (REMERON ) 30 MG tablet [id number]  TAKE 1 TABLET(30 MG) BY MOUTH AT BEDTIME Arfeen, Syed T, MD  Active   propranolol  (INDERAL ) 40 MG tablet [id number]  TAKE ONE TABLET BY MOUTH TWICE DAILY Dameron, Marisa, DO  Active   spironolactone  (ALDACTONE ) 25 MG tablet [id number]  TAKE 1 Tablet BY MOUTH ONCE DAILY at bedtime Dameron, Marisa, DO  Active   Med List Note Clyde Darling, DO 09/14/12 2000): Remeron  & Wellbutrin  Rx by Dr. Eduard Grad            Recommendation:   PCP Follow-up Referral to: [facility] ([facility]) for  therapy Continue Current Plan of Care Please contact [facility] to ensure that you can make a request for [employer] Voucher Relocation   Follow Up Plan:   Telephone follow-up in 1 month Referral to RN Case Manager  Kolleen Perone, BSW, MSW, LCSW Licensed Clinical Social Worker [employer] [facility]   [facility] [email or web address] Direct Dial: [phone]

## 2024-04-08 NOTE — Progress Notes (Signed)
 Subjective:   Patient ID: [name], female   DOB: 73 y.o.   MRN: [id number]   HPI Patient of Dr. Valda Garnet who cannot wait to see him who has thick nailbeds 1-5 both feet that are painful and hard to wear shoe gear with   ROS      Objective:  Physical Exam  Neurovascular status unchanged thick yellow brittle nailbeds 1-5 both feet painful     Assessment:  Chronic mycotic nail infection 1-5 both feet     Plan:  Debride nailbeds no iatrogenic bleeding reappoint routine care

## 2024-04-18 ENCOUNTER — Telehealth: Payer: Self-pay | Admitting: *Deleted

## 2024-04-18 NOTE — Progress Notes (Unsigned)
 Complex Care Management Care Guide Note  04/18/2024 Name: [name] MRN: [id number] DOB: June 16, 1951  [name] is a 73 y.o. year old female who is a primary care patient of Dameron, Marisa, DO and is actively engaged with the care management team. I reached out to [facility] by phone today to assist with scheduling  with the RN Case Manager.  Follow up plan: Telephone appointment with complex care management team member scheduled for:  04/27/24  Harlene Satterfield  [facility]  [facility], [facility] Guide  Direct Dial: [phone]  Fax [phone]

## 2024-04-21 ENCOUNTER — Ambulatory Visit (INDEPENDENT_AMBULATORY_CARE_PROVIDER_SITE_OTHER): Admitting: Student

## 2024-04-21 ENCOUNTER — Encounter: Payer: Self-pay | Admitting: Licensed Clinical Social Worker

## 2024-04-21 ENCOUNTER — Telehealth: Payer: Self-pay | Admitting: Licensed Clinical Social Worker

## 2024-04-21 VITALS — BP 122/79 | HR 50 | Ht 61.0 in | Wt 157.6 lb

## 2024-04-21 DIAGNOSIS — E1169 Type 2 diabetes mellitus with other specified complication: Secondary | ICD-10-CM

## 2024-04-21 DIAGNOSIS — R300 Dysuria: Secondary | ICD-10-CM | POA: Diagnosis not present

## 2024-04-21 MED ORDER — EASY COMFORT LANCETS MISC: 99 refills | Status: AC

## 2024-04-21 MED ORDER — GLUCOSE BLOOD VI STRP: ORAL_STRIP | 12 refills | Status: AC

## 2024-04-21 NOTE — Patient Instructions (Signed)
 It was great seeing you today.  As we discussed, - Urine tests today - I sent in test strips, lancets   If you have any questions or concerns, please feel free to call the clinic.   Have a wonderful day,  Dr. Barabara Dama [facility] [phone]

## 2024-04-21 NOTE — Progress Notes (Signed)
    SUBJECTIVE:   CHIEF COMPLAINT / HPI:   Dysuria - Concerned she may have a UTI - Started having dysuria 2 weeks ago - No hematuria, fever or chills - No vaginal discharge, irritation, odoor  T2DM - Needs refills on lancets and test strips  PERTINENT  PMH / PSH: T2DM (A1c 7.5%)  OBJECTIVE:   BP 122/79   Pulse (!) 50   Ht 5' 1 (1.549 m)   Wt 157 lb 9.6 oz (71.5 kg)   SpO2 100%   BMI 29.78 kg/m   General: Chronically ill appearing, no distress HEENT: Edentulous upper mouth Respiratory: Normal effort on room air Gait: Ambulates without assistance or assistive devices  ASSESSMENT/PLAN:   Dysuria Dysuria that may or may not be associated with urinary tract infection Point-of-care urinalysis ordered today Also ordered urine culture given age greater than 91 and T2DM Reassuringly, no signs of upper tract infection or systemic signs of infection   Re-ordered  Accu-Chek glucose strips and lancets  Barabara Dama, DO [facility]

## 2024-04-21 NOTE — Assessment & Plan Note (Signed)
 Dysuria that may or may not be associated with urinary tract infection Point-of-care urinalysis ordered today Also ordered urine culture given age greater than 74 and T2DM Reassuringly, no signs of upper tract infection or systemic signs of infection

## 2024-04-27 ENCOUNTER — Other Ambulatory Visit: Payer: Self-pay

## 2024-04-28 ENCOUNTER — Encounter: Payer: Self-pay | Admitting: Licensed Clinical Social Worker

## 2024-04-28 ENCOUNTER — Telehealth: Payer: Self-pay | Admitting: Licensed Clinical Social Worker

## 2024-04-28 NOTE — Patient Instructions (Addendum)
 Visit Information  Thank you for taking time to visit with me today. Please don't hesitate to contact me if I can be of assistance to you before our next scheduled appointment.  Your next care management appointment is by telephone on 06/01/24 at 230 pm   Please call the care guide team at [phone] if you need to cancel, schedule, or reschedule an appointment.   Please call [phone]-TALK (toll free, 24 hour hotline) go to [facility] Urgent [facility] [address] [place] [phone]) call 911 if you are experiencing a Mental Health or Behavioral Health Crisis or need someone to talk to.  Wilbert Diver RN, BSN, [facility] [place]  [facility], [facility]    Care Coordinator Phone: [phone]

## 2024-04-28 NOTE — Patient Outreach (Addendum)
 Complex Care Management   Visit Note  04/28/2024  Name:  [name] MRN: [id number] DOB: 1951-03-11  Situation: Referral received for Complex Care Management related to COPD I obtained verbal consent from Patient.  Visit completed with patient  on the phone  Background:   Past Medical History:  Diagnosis Date   Adenomatous colon polyp    Allergy    Allergy to environmental factors 02/09/2020   Anxiety    Asthma    Back pain with radiation    radiopathy    Bilateral carpal tunnel syndrome 10/27/2010   Qualifier: Diagnosis of  By: Antonetta [name], [name]     Cataract    FORMING    Cellulitis of left upper extremity 02/09/2020   COPD (chronic obstructive pulmonary disease) (HCC)    Corneal abrasion, left 04/28/2013   Denture sore mouth 08/29/2021   Patient reports that she cannot wear her top dentures due to soreness in her mouth. She reports that this has been going on since her upper teeth were extracted. She requests referral to oral surgeon.   Depression    Diabetes mellitus without complication (HCC)    ON METFORMIN     Dizziness 09/07/2018   GERD (gastroesophageal reflux disease)    Glaucoma    History of tobacco abuse 08/18/2008   HSV-2 (herpes simplex virus 2) infection 02/29/2016   Dx 2014, + serology   Hx of migraines    Hyperlipidemia    Hyperlipidemia associated with type 2 diabetes mellitus (HCC) 05/17/2012   Hypertension    on meds   Itching in the vaginal area 05/17/2015   Nicotine  addiction    PANIC ATTACK 11/05/2007   PELVIC  PAIN 08/13/2010   Plantar wart of right foot 09/14/2019   Skin lesions 12/08/2013   Has undergone extensive however incomplete autoimmune workup that has been unrevealing at this time  Concern for sarcoidosis versus SLE versus other connective tissue disorder including drug related SLE like syndrome     Snoring 08/29/2021   Type 2 diabetes mellitus with hyperglycemia (HCC) 11/22/2021   Urinary incontinence    Well controlled  type 2 diabetes mellitus (HCC) 02/24/2018    Assessment  I communicated with [name], who indicated that she attended an appointment for urinary issues and attempted to provide a sample; however, she was unable to [name] so. She subsequently left the office without a clear understanding of the next steps in the process. I have contacted the office on her behalf, and they will reach out to her to clarify the subsequent steps she should take. Patient Reported Symptoms:  Cognitive Cognitive Status: No symptoms reported      Neurological Neurological Review of Symptoms: Headaches Neurological Management Strategies: Medication therapy Neurological Comment: patient has had a 3 weeks strait and she takes tylenol .  HEENT HEENT Symptoms Reported: No symptoms reported      Cardiovascular Cardiovascular Symptoms Reported: No symptoms reported Does patient have uncontrolled Hypertension?: No    Respiratory Respiratory Symptoms Reported: No symptoms reported    Endocrine Endocrine Symptoms Reported: No symptoms reported Is patient diabetic?: Yes Is patient checking blood sugars at home?: Yes List most recent blood sugar readings, include date and time of day: she was not able due to not having lancets    Gastrointestinal Gastrointestinal Symptoms Reported: No symptoms reported      Genitourinary Genitourinary Symptoms Reported: Itching or irritation, Frequency, Pain/burning with urination Additional Genitourinary Details: patient was having back pain.    Integumentary Integumentary Symptoms Reported:  Skin changes Additional Integumentary Details: Rash on the right wrist and leg since January Skin Management Strategies: Routine screening Skin Self-Management Outcome: 2 (bad)  Musculoskeletal Musculoskelatal Symptoms Reviewed: Weakness Musculoskeletal Management Strategies: Adequate rest Falls in the past year?: Yes Number of falls in past year: 2 or more Was there an injury with Fall?:  No Fall Risk Category Calculator: 2 Patient Fall Risk Level: Moderate Fall Risk Patient at Risk for Falls Due to: Other (Comment) (patient is slu footted  and going down the stairs and fell) Fall risk Follow up: Falls evaluation completed, Education provided, Falls prevention discussed  Psychosocial       Quality of Family Relationships: supportive, involved [name] you feel physically threatened by others?: No      04/27/2024   11:05 AM  Depression screen PHQ 2/9  Decreased Interest 1  Down, Depressed, Hopeless 1  PHQ - 2 Score 2  Tired, decreased energy 3  Change in appetite 3  Feeling bad or failure about yourself  1  Trouble concentrating 0  Moving slowly or fidgety/restless 1  Suicidal thoughts 0  Difficult doing work/chores Somewhat difficult    There were no vitals filed for this visit.  Medications Reviewed Today     Reviewed by Emily Corning, RN (Registered Nurse) on 04/27/24 at 1023  Med List Status: <None>   Medication Order Taking? Sig Documenting Provider Last Dose Status Informant  acetaminophen  (TYLENOL ) 500 MG tablet [id number] Yes Take 1,000 mg by mouth every 8 (eight) hours as needed for moderate pain. Provider, Historical, [name]  Active Self  Alcohol Swabs (ALCOHOL PADS) 70 % PADS [id number] Yes SWAB TO cleanse AREA EVERY DAY prior TO blood glucose check [name], Emily Jaralla, [name]  Active   alendronate  (FOSAMAX ) 70 MG tablet [id number] Yes TAKE 1 TABLET BY MOUTH EVERY 7 DAY IN THE MORNING WITH A FULL GLASS OF WATER ON AN EMPTY STOMACH. [name] NOT TAKE ANYTHING ELSE OR LIE DOWN FOR 30 MINUTES. [name], Marisa, [name]  Active   amLODipine  (NORVASC ) 10 MG tablet [id number] Yes TAKE 1 Tablet BY MOUTH ONCE DAILY at bedtime [name], Marisa, [name]  Active   atorvastatin  (LIPITOR) 80 MG tablet [id number] Yes TAKE 1 TABLET(80 MG) BY MOUTH DAILY Emily Merle, [name]  Active   Blood Glucose Monitoring Suppl (ACCU-CHEK GUIDE ME) w/Device KIT [id number] Yes 1 Device by Does not apply route daily in  the afternoon. [name], Emily Cardinal, [name]  Active   Blood Pressure Monitoring (BLOOD PRESSURE KIT) DEVI [id number]  Please measure BP at same time each day Emily Merle, [name]  Active Self  buPROPion  (WELLBUTRIN  XL) 300 MG 24 hr tablet [id number]  Take 1 tablet (300 mg total) by mouth every morning. Arfeen, Syed T, [name]  Active   clobetasol  ointment (TEMOVATE ) 0.05 % [id number] Yes APPLY TOPICALLY TO THE AFFECTED AREA TWICE DAILY Emily Merle, [name]  Active   cloNIDine  (CATAPRES ) 0.2 MG tablet [id number] Yes take 1 Tablet by mouth at bedtime [name], Marisa, [name]  Active   clotrimazole  (LOTRIMIN ) 1 % cream [id number] Yes Apply 1 application. topically 2 (two) times daily. Emily Merle, [name]  Active   Easy Comfort Lancets MISC [id number] Yes Use with glucose device to check blood glucose once daily [name], Marisa, [name]  Active   erythromycin  ophthalmic ointment [id number] Yes Place a 1/2 inch ribbon of ointment into the lower eyelid. Kingsley, Victoria K, [name]  Active   ezetimibe  (ZETIA ) 10 MG tablet [id number] Yes TAKE 1 Tablet BY MOUTH ONCE DAILY [name], Marisa, [name]  Active   famotidine  (PEPCID ) 20 MG tablet [id number] Yes Take 1 Tablet by mouth once daily [name], Marisa, [name]  Active   fluticasone  (FLONASE ) 50 MCG/ACT nasal spray [id number] Yes Place into the nose. Provider, Historical, [name]  Active   gabapentin  (NEURONTIN ) 300 MG capsule [id number] Yes Take 1 capsule (300 mg total) by mouth at bedtime. Arfeen, Syed T, [name]  Active   glipiZIDE  (GLUCOTROL ) 5 MG tablet [id number] Yes Take 1 tablet (5 mg total) by mouth daily before breakfast. [name], Emily Jaralla, [name]  Active   glucose blood test strip [id number]  Use to check blood glucose once daily in the evening [name], Marisa, [name]  Active   hydrochlorothiazide  (HYDRODIURIL ) 12.5 MG tablet [id number] Yes TAKE ONE TABLET BY MOUTH EVERY DAY [name], Marisa, [name]  Active   latanoprost (XALATAN) 0.005 % ophthalmic solution [id number] Yes Place 1 drop into both eyes at bedtime.  Provider, Historical, [name]  Active Self  LINZESS  145 MCG CAPS capsule [id number] Yes TAKE 1 Capsule BY MOUTH ONCE DAILY BEFORE breakfast [name], Barabara, [name]  Active   loratadine  (CLARITIN  REDITABS) 10 MG dissolvable tablet [id number] Yes Take 1 tablet (10 mg total) by mouth daily. As needed for allergy symptoms Hensel, Elsie LABOR, [name]  Active Self  losartan  (COZAAR ) 50 MG tablet [id number] Yes TAKE 1 Tablet BY MOUTH ONCE DAILY [name], Marisa, [name]  Active   metFORMIN  (GLUCOPHAGE ) 1000 MG tablet [id number] Yes TAKE 1 Tablet BY MOUTH ONCE DAILY WITH BREAKFAST [name], Emily Jaralla, [name]  Active   mirtazapine  (REMERON ) 30 MG tablet [id number] Yes TAKE 1 TABLET(30 MG) BY MOUTH AT BEDTIME Arfeen, Syed T, [name]  Active   propranolol  (INDERAL ) 40 MG tablet [id number] Yes TAKE ONE TABLET BY MOUTH TWICE DAILY [name], Marisa, [name]  Active   spironolactone  (ALDACTONE ) 25 MG tablet [id number] Yes TAKE 1 Tablet BY MOUTH ONCE DAILY at bedtime [name], Marisa, [name]  Active   Med List Note Earla Ozell BIRCH, [name] 09/14/12 2000): Remeron  & Wellbutrin  Rx by Dr. Leni Client            Recommendation:   PCP Follow-up  Follow Up Plan:   Telephone follow up appointment with care management team member scheduled for:  06/01/24  230 pm  Wilbert Diver RN, BSN, [facility] [place]  [facility], [facility]    Care Coordinator Phone: [phone]

## 2024-05-10 ENCOUNTER — Other Ambulatory Visit: Payer: Self-pay

## 2024-05-10 DIAGNOSIS — M8580 Other specified disorders of bone density and structure, unspecified site: Secondary | ICD-10-CM

## 2024-05-11 ENCOUNTER — Ambulatory Visit: Admitting: Family Medicine

## 2024-05-11 NOTE — Progress Notes (Deleted)
    SUBJECTIVE:   CHIEF COMPLAINT / HPI:   Dysuria Ongoing since June  - did not have UA done at last visit in June *** No hematuria, fevers, chills, vaginal discharge, irritation/odor ***  Osteoporosis On Fosamax  since 2022 ***   PERTINENT  PMH / PSH: ***  OBJECTIVE:   There were no vitals taken for this visit.  ***  ASSESSMENT/PLAN:   Assessment & Plan    Payton Coward, MD [facility]

## 2024-05-12 MED ORDER — ALENDRONATE SODIUM 70 MG PO TABS: ORAL_TABLET | ORAL | 6 refills | Status: DC

## 2024-05-18 ENCOUNTER — Ambulatory Visit (HOSPITAL_COMMUNITY)

## 2024-05-31 ENCOUNTER — Telehealth

## 2024-06-01 ENCOUNTER — Encounter: Payer: Self-pay | Admitting: *Deleted

## 2024-06-01 ENCOUNTER — Telehealth: Payer: Self-pay | Admitting: *Deleted

## 2024-06-01 NOTE — Patient Instructions (Signed)
 Caramia GORMAN Slicker - I am sorry I was unable to reach you today for our scheduled appointment. I work with Romelle Booty, MD and am calling to support your healthcare needs. Please contact me at [phone] at your earliest convenience. I look forward to speaking with you soon.   Thank you,   Olam Ku, RN, BSN [place]  [facility], [facility] RN Care Manager Direct Dial: [phone]  Fax: [phone]

## 2024-06-03 ENCOUNTER — Other Ambulatory Visit: Payer: Self-pay | Admitting: Internal Medicine

## 2024-06-09 ENCOUNTER — Ambulatory Visit (INDEPENDENT_AMBULATORY_CARE_PROVIDER_SITE_OTHER): Payer: 59

## 2024-06-09 ENCOUNTER — Telehealth: Payer: Self-pay

## 2024-06-09 VITALS — Ht 61.0 in | Wt 156.0 lb

## 2024-06-09 DIAGNOSIS — Z0000 Encounter for general adult medical examination without abnormal findings: Secondary | ICD-10-CM | POA: Diagnosis not present

## 2024-06-09 NOTE — Telephone Encounter (Signed)
 FYI... Patient scored a 17 on her 6CIT Depression Screening.  Patient stated that he depression has gotten worse. Patient stated that she is not suicidal or have any plans to harm herself.  She is very isolated and does not leave her apartment.  She is scheduled for physical on 06/17/24 at 3:10 pm.  Roz SAILOR. Tomie, LPN [facility] Annual Wellness Team Direct Dial: [phone]

## 2024-06-09 NOTE — Patient Instructions (Signed)
 Ms. Briceno , Thank you for taking time out of your busy schedule to complete your Annual Wellness Visit with me. I enjoyed our conversation and look forward to speaking with you again next year. I, as well as your care team,  appreciate your ongoing commitment to your health goals. Please review the following plan we discussed and let me know if I can assist you in the future. Your Game plan/ To Do List    Referrals: If you haven't heard from the office you've been referred to, please reach out to them at the phone provided.   Follow up Visits: We will see or speak with you next year for your Next Medicare AWV with our clinical staff Have you seen your provider in the last 6 months (3 months if uncontrolled diabetes)? Yes  Clinician Recommendations:  Aim for 30 minutes of exercise or brisk walking, 6-8 glasses of water, and 5 servings of fruits and vegetables each day.       This is a list of the screenings recommended for you:  Health Maintenance  Topic Date Due   Yearly kidney health urinalysis for diabetes  Never done   Zoster (Shingles) Vaccine (1 of 2) Never done   Colon Cancer Screening  08/08/2019   Eye exam for diabetics  11/12/2023   Flu Shot  05/27/2024   Complete foot exam   07/02/2024   Hemoglobin A1C  07/30/2024   COVID-19 Vaccine (6 - 2024-25 season) 07/30/2024   Yearly kidney function blood test for diabetes  12/14/2024   Medicare Annual Wellness Visit  06/09/2025   DTaP/Tdap/Td vaccine (3 - Td or Tdap) 01/26/2031   Pneumococcal Vaccine for age over 12  Completed   DEXA scan (bone density measurement)  Completed   Hepatitis C Screening  Completed   HPV Vaccine  Aged Out   Meningitis B Vaccine  Aged Out   Pneumococcal Vaccine  Discontinued    Advanced directives: (Declined) Advance directive discussed with you today. Even though you declined this today, please call our office should you change your mind, and we can give you the proper paperwork for you to fill  out. Advance Care Planning is important because it:  [x]  Makes sure you receive the medical care that is consistent with your values, goals, and preferences  [x]  It provides guidance to your family and loved ones and reduces their decisional burden about whether or not they are making the right decisions based on your wishes.  Follow the link provided in your after visit summary or read over the paperwork we have mailed to you to help you started getting your Advance Directives in place. If you need assistance in completing these, please reach out to us  so that we can help you!  See attachments for Preventive Care and Fall Prevention Tips.

## 2024-06-09 NOTE — Progress Notes (Signed)
 Because this visit was a virtual/telehealth visit,  certain criteria was not obtained, such a blood pressure, CBG if applicable, and timed get up and go. Any medications not marked as taking were not mentioned during the medication reconciliation part of the visit. Any vitals not documented were not able to be obtained due to this being a telehealth visit or patient was unable to self-report a recent blood pressure reading due to a lack of equipment at home via telehealth. Vitals that have been documented are verbally provided by the patient.   Subjective:   [name] is a 73 y.o. who presents for a Medicare Wellness preventive visit.  As a reminder, Annual Wellness Visits don't include a physical exam, and some assessments may be limited, especially if this visit is performed virtually. We may recommend an in-person follow-up visit with your provider if needed.  Visit Complete: Virtual I connected with  [name] on 06/09/24 by a audio enabled telemedicine application and verified that I am speaking with the correct person using two identifiers.  Patient Location: Home  Provider Location: Home Office  I discussed the limitations of evaluation and management by telemedicine. The patient expressed understanding and agreed to proceed.  Vital Signs: Because this visit was a virtual/telehealth visit, some criteria may be missing or patient reported. Any vitals not documented were not able to be obtained and vitals that have been documented are patient reported.  VideoDeclined- This patient declined Librarian, academic. Therefore the visit was completed with audio only.  Persons Participating in Visit: Patient.  AWV Questionnaire: No: Patient Medicare AWV questionnaire was not completed prior to this visit.  Cardiac Risk Factors include: advanced age (>48men, >52 women);sedentary lifestyle;diabetes mellitus;dyslipidemia;hypertension;family  history of premature cardiovascular disease     Objective:    Today's Vitals   06/09/24 1337  Weight: 156 lb (70.8 kg)  Height: 5' 1 (1.549 m)  PainSc: 0-No pain   Body mass index is 29.48 kg/m.     06/09/2024    1:57 PM 02/18/2024    2:21 PM 01/29/2024    9:44 AM 12/15/2023    6:19 PM 08/22/2023    3:32 PM 06/06/2023    8:21 PM 03/09/2023    3:17 PM  Advanced Directives  Does Patient Have a Medical Advance Directive? No No No No No No No  Would patient like information on creating a medical advance directive? No - Patient declined No - Patient declined No - Patient declined   Yes (MAU/Ambulatory/Procedural Areas - Information given) No - Patient declined    Current Medications (verified) Outpatient Encounter Medications as of 06/09/2024  Medication Sig   acetaminophen  (TYLENOL ) 500 MG tablet Take 1,000 mg by mouth every 8 (eight) hours as needed for moderate pain.   Alcohol Swabs (ALCOHOL PADS) 70 % PADS SWAB TO cleanse AREA EVERY DAY prior TO blood glucose check   alendronate  (FOSAMAX ) 70 MG tablet TAKE 1 TABLET BY MOUTH EVERY 7 DAY IN THE MORNING WITH A FULL GLASS OF WATER ON AN EMPTY STOMACH. DO NOT TAKE ANYTHING ELSE OR LIE DOWN FOR 30 MINUTES.   amLODipine  (NORVASC ) 10 MG tablet TAKE 1 Tablet BY MOUTH ONCE DAILY at bedtime   atorvastatin  (LIPITOR) 80 MG tablet TAKE 1 TABLET(80 MG) BY MOUTH DAILY   Blood Glucose Monitoring Suppl (ACCU-CHEK GUIDE ME) w/Device KIT 1 Device by Does not apply route daily in the afternoon.   Blood Pressure Monitoring (BLOOD PRESSURE KIT) DEVI Please measure BP  at same time each day   buPROPion  (WELLBUTRIN  XL) 300 MG 24 hr tablet Take 1 tablet (300 mg total) by mouth every morning.   clobetasol  ointment (TEMOVATE ) 0.05 % APPLY TOPICALLY TO THE AFFECTED AREA TWICE DAILY   cloNIDine  (CATAPRES ) 0.2 MG tablet take 1 Tablet by mouth at bedtime   clotrimazole  (LOTRIMIN ) 1 % cream Apply 1 application. topically 2 (two) times daily.   Easy Comfort Lancets  MISC Use with glucose device to check blood glucose once daily   erythromycin  ophthalmic ointment Place a 1/2 inch ribbon of ointment into the lower eyelid.   ezetimibe  (ZETIA ) 10 MG tablet TAKE 1 Tablet BY MOUTH ONCE DAILY   famotidine  (PEPCID ) 20 MG tablet Take 1 Tablet by mouth once daily   fluticasone  (FLONASE ) 50 MCG/ACT nasal spray Place into the nose.   gabapentin  (NEURONTIN ) 300 MG capsule Take 1 capsule (300 mg total) by mouth at bedtime.   glipiZIDE  (GLUCOTROL ) 5 MG tablet Take 1 tablet (5 mg total) by mouth daily before breakfast.   glucose blood test strip Use to check blood glucose once daily in the evening   hydrochlorothiazide  (HYDRODIURIL ) 12.5 MG tablet TAKE ONE TABLET BY MOUTH EVERY DAY   latanoprost (XALATAN) 0.005 % ophthalmic solution Place 1 drop into both eyes at bedtime.   LINZESS  145 MCG CAPS capsule TAKE 1 Capsule BY MOUTH ONCE DAILY BEFORE breakfast   loratadine  (CLARITIN  REDITABS) 10 MG dissolvable tablet Take 1 tablet (10 mg total) by mouth daily. As needed for allergy symptoms   losartan  (COZAAR ) 50 MG tablet TAKE 1 Tablet BY MOUTH ONCE DAILY   metFORMIN  (GLUCOPHAGE ) 1000 MG tablet TAKE 1 Tablet BY MOUTH ONCE DAILY WITH BREAKFAST   mirtazapine  (REMERON ) 30 MG tablet TAKE 1 TABLET(30 MG) BY MOUTH AT BEDTIME   propranolol  (INDERAL ) 40 MG tablet TAKE ONE TABLET BY MOUTH TWICE DAILY   spironolactone  (ALDACTONE ) 25 MG tablet TAKE 1 Tablet BY MOUTH ONCE DAILY at bedtime   No facility-administered encounter medications on file as of 06/09/2024.    Allergies (verified) Aspirin, Penicillins, and Latex   History: Past Medical History:  Diagnosis Date   Adenomatous colon polyp    Allergy    Allergy to environmental factors 02/09/2020   Anxiety    Asthma    Back pain with radiation    radiopathy    Bilateral carpal tunnel syndrome 10/27/2010   Qualifier: Diagnosis of  By: Antonetta MD, [name]     Cataract    FORMING    Cellulitis of left upper extremity  02/09/2020   COPD (chronic obstructive pulmonary disease) (HCC)    Corneal abrasion, left 04/28/2013   Denture sore mouth 08/29/2021   Patient reports that she cannot wear her top dentures due to soreness in her mouth. She reports that this has been going on since her upper teeth were extracted. She requests referral to oral surgeon.   Depression    Diabetes mellitus without complication (HCC)    ON METFORMIN     Dizziness 09/07/2018   GERD (gastroesophageal reflux disease)    Glaucoma    History of tobacco abuse 08/18/2008   HSV-2 (herpes simplex virus 2) infection 02/29/2016   Dx 2014, + serology   Hx of migraines    Hyperlipidemia    Hyperlipidemia associated with type 2 diabetes mellitus (HCC) 05/17/2012   Hypertension    on meds   Itching in the vaginal area 05/17/2015   Nicotine  addiction    PANIC ATTACK 11/05/2007   PELVIC  PAIN 08/13/2010   Plantar wart of right foot 09/14/2019   Skin lesions 12/08/2013   Has undergone extensive however incomplete autoimmune workup that has been unrevealing at this time  Concern for sarcoidosis versus SLE versus other connective tissue disorder including drug related SLE like syndrome     Snoring 08/29/2021   Type 2 diabetes mellitus with hyperglycemia (HCC) 11/22/2021   Urinary incontinence    Well controlled type 2 diabetes mellitus (HCC) 02/24/2018   Past Surgical History:  Procedure Laterality Date   ABDOMINAL HYSTERECTOMY     bleeding ulcer repair  1992   Open Ex Lap with perforation of duodenal ulcer with pertitonitis   CHOLECYSTECTOMY  1999   COLONOSCOPY     HIATAL HERNIA REPAIR  1967   left ankle surgery  2008   POLYPECTOMY     removal of mild ducts from left breast  1982   SIMPLE MASTECTOMY Bilateral 06/04/2010   TOTAL ABDOMINAL HYSTERECTOMY W/ BILATERAL SALPINGOOPHORECTOMY  1989   TUBAL LIGATION  1975, 1978   x 2   Family History  Problem Relation Age of Onset   Heart disease Mother    Diabetes Mother    Lupus  Mother    Colon polyps Mother    Tuberculosis Father    Depression Sister    Diabetes Brother    Colon polyps Maternal Aunt    Heart disease Maternal Grandmother    Heart attack Maternal Aunt    Stomach cancer Sister    Endometriosis Daughter    Diabetes Daughter    Colon cancer Neg Hx    Esophageal cancer Neg Hx    Pancreatic cancer Neg Hx    Rectal cancer Neg Hx    Social History   Socioeconomic History   Marital status: Legally Separated    Spouse name: Not on file   Number of children: 3   Years of education: 12   Highest education level: High school graduate  Occupational History   Occupation: retired   Occupation: Retired  Tobacco Use   Smoking status: Former    Current packs/day: 0.00    Average packs/day: 0.3 packs/day for 42.0 years (10.5 ttl pk-yrs)    Types: Cigarettes    Start date: 07/07/1975    Quit date: 07/06/2017    Years since quitting: 6.9   Smokeless tobacco: Never  Vaping Use   Vaping status: Never Used  Substance and Sexual Activity   Alcohol use: No    Alcohol/week: 0.0 standard drinks of alcohol   Drug use: No   Sexual activity: Not Currently    Birth control/protection: Post-menopausal  Other Topics Concern   Not on file  Social History Narrative   Patient lives alone in [place].    Patient uses public transportation.    Patient enjoys reading and watching detective shows.    Social Drivers of Corporate investment banker Strain: Low Risk  (06/09/2024)   Overall Financial Resource Strain (CARDIA)    Difficulty of Paying Living Expenses: Not very hard  Food Insecurity: No Food Insecurity (06/09/2024)   Hunger Vital Sign    Worried About Running Out of Food in the Last Year: Never true    Ran Out of Food in the Last Year: Never true  Transportation Needs: No Transportation Needs (06/09/2024)   PRAPARE - Administrator, Civil Service (Medical): No    Lack of Transportation (Non-Medical): No  Physical Activity: Inactive  (06/09/2024)   Exercise Vital Sign  Days of Exercise per Week: 0 days    Minutes of Exercise per Session: 0 min  Stress: No Stress Concern Present (06/09/2024)   [employer] of Occupational Health - Occupational Stress Questionnaire    Feeling of Stress: Not at all  Social Connections: Socially Isolated (06/09/2024)   Social Connection and Isolation Panel    Frequency of Communication with Friends and Family: Twice a week    Frequency of Social Gatherings with Friends and Family: Twice a week    Attends Religious Services: Never    Database administrator or Organizations: No    Attends Engineer, structural: Never    Marital Status: Divorced    Tobacco Counseling Counseling given: Not Answered    Clinical Intake:  Pre-visit preparation completed: Yes  Pain : No/denies pain Pain Score: 0-No pain     BMI - recorded: 29.48 Nutritional Status: BMI 25 -29 Overweight Nutritional Risks: None Diabetes: Yes CBG done?: No Did pt. bring in CBG monitor from home?: No  Lab Results  Component Value Date   HGBA1C 7.5 (A) 01/29/2024   HGBA1C 8.9 (A) 07/03/2023   HGBA1C 8.7 (A) 03/09/2023     How often do you need to have someone help you when you read instructions, pamphlets, or other written materials from your doctor or pharmacy?: 1 - Never  Interpreter Needed?: No  Information entered by :: Michie Molnar N. Pierre Dellarocco, LPN.   Activities of Daily Living     06/09/2024    1:42 PM  In your present state of health, do you have any difficulty performing the following activities:  Hearing? 0  Vision? 0  Difficulty concentrating or making decisions? 1  Comment mild cognitive disorder  Walking or climbing stairs? 0  Dressing or bathing? 0  Doing errands, shopping? 0  Preparing Food and eating ? N  Using the Toilet? N  In the past six months, have you accidently leaked urine? N  Do you have problems with loss of bowel control? N  Managing your Medications? N   Managing your Finances? N  Housekeeping or managing your Housekeeping? N    Patient Care Team: Romelle Booty, MD as PCP - General (Family Medicine) Roz Anes, MD as Consulting Physician (Ophthalmology) Pyrtle, Gordy HERO, MD as Consulting Physician (Gastroenterology) Curry Leni DASEN, MD as Consulting Physician (Psychiatry) Weyman Corning, RN (Inactive) as [facility] Care Management Merlynn Lyle LITTIE KEN as [facility] Care Management (Licensed Clinical Social Worker)  I have updated your Care Teams any recent Medical Services you may have received from other providers in the past year.     Assessment:   This is a routine wellness examination for [name].  Hearing/Vision screen Hearing Screening - Comments:: Denies hearing difficulties.  Vision Screening - Comments:: Wears rx glasses - not up to date with routine eye exams. Last seen by Dr. Anes Roz (retired)    Goals Addressed             This Visit's Progress    06/09/24: My goal is to feel better mentally.         Depression Screen: Patient stated that her depression has gotten worse, but no indication of self harm or suicide.    06/09/2024    1:44 PM 04/27/2024   11:05 AM 04/21/2024    4:02 PM 04/07/2024    3:18 PM 02/18/2024    2:16 PM 01/29/2024    9:45 AM 06/06/2023    8:18 PM  PHQ 2/9 Scores  PHQ - 2 Score 6 2 2 2 2 4  0  PHQ- 9 Score 17  5 6 10 13  0    Fall Risk     06/09/2024    1:41 PM 04/27/2024   11:04 AM 04/21/2024    4:02 PM 04/21/2024    3:56 PM 02/18/2024    2:18 PM  Fall Risk   Falls in the past year? 1 1 1  0 0  Number falls in past yr: 1 1 1 1  0  Injury with Fall? 0 0 0 0 0  Risk for fall due to : No Fall Risks Other (Comment)     Risk for fall due to: Comment  patient is slu footted  and going down the stairs and fell     Follow up Falls evaluation completed Falls evaluation completed;Education provided;Falls prevention discussed       MEDICARE RISK AT HOME:  Medicare Risk at Home Any  stairs in or around the home?: Yes (USES THE ELEVATOR) If so, are there any without handrails?: No Home free of loose throw rugs in walkways, pet beds, electrical cords, etc?: Yes Adequate lighting in your home to reduce risk of falls?: Yes Life alert?: Yes (BEDROOM & BATHROOM) Use of a cane, walker or w/c?: No Grab bars in the bathroom?: No (DAUGHTER ORDERED ONE FOR SHOWER) Shower chair or bench in shower?: No Elevated toilet seat or a handicapped toilet?: No  TIMED UP AND GO:  Was the test performed?  No  Cognitive Function: Impaired: Patient has current diagnosis of cognitive impairment.    06/09/2024    1:43 PM  MMSE - Mini Mental State Exam  Not completed: Unable to complete      08/29/2021    3:48 PM  Montreal Cognitive Assessment   Visuospatial/ Executive (0/5) 2  Naming (0/3) 2  Attention: Read list of digits (0/2) 2  Attention: Read list of letters (0/1) 1  Attention: Serial 7 subtraction starting at 100 (0/3) 0  Language: Repeat phrase (0/2) 2  Language : Fluency (0/1) 0  Abstraction (0/2) 2  Delayed Recall (0/5) 1  Orientation (0/6) 6  Total 18      06/06/2023    8:21 PM 07/04/2021    3:48 PM  6CIT Screen  What Year? 0 points 0 points  What month? 0 points 0 points  What time? 0 points 0 points  Count back from 20 0 points 0 points  Months in reverse 2 points 2 points  Repeat phrase 0 points 0 points  Total Score 2 points 2 points    Immunizations Immunization History  Administered Date(s) Administered   Fluad Quad(high Dose 65+) 07/18/2021   H1N1 09/28/2008   Influenza Split 09/13/2012   Influenza Whole 09/16/2005, 10/03/2007, 08/08/2009, 08/13/2010   Influenza, High Dose Seasonal PF 07/13/2017   Influenza,inj,Quad PF,6+ Mos 07/07/2013, 10/02/2014, 11/26/2015, 08/01/2016, 07/14/2018, 08/05/2019, 09/14/2020   Influenza-Unspecified 07/13/2017, 09/09/2022   PFIZER Comirnaty(Gray Top)Covid-19 Tri-Sucrose Vaccine 03/26/2021   PFIZER(Purple  Top)SARS-COV-2 Vaccination 03/27/2020, 04/23/2020   Pfizer Covid-19 Vaccine Bivalent Booster 36yrs & up 08/29/2021   Pfizer(Comirnaty)Fall Seasonal Vaccine 12 years and older 01/29/2024   Pneumococcal Conjugate-13 11/26/2015   Pneumococcal Polysaccharide-23 06/02/2004, 02/24/2018   Td 06/12/2004   Tdap 01/25/2021    Screening Tests Health Maintenance  Topic Date Due   Diabetic kidney evaluation - Urine ACR  Never done   Zoster Vaccines- Shingrix (1 of 2) Never done   Colonoscopy  08/08/2019   OPHTHALMOLOGY EXAM  11/12/2023  INFLUENZA VACCINE  05/27/2024   FOOT EXAM  07/02/2024   HEMOGLOBIN A1C  07/30/2024   COVID-19 Vaccine (6 - 2024-25 season) 07/30/2024   Diabetic kidney evaluation - eGFR measurement  12/14/2024   Medicare Annual Wellness (AWV)  06/09/2025   DTaP/Tdap/Td (3 - Td or Tdap) 01/26/2031   Pneumococcal Vaccine: 50+ Years  Completed   DEXA SCAN  Completed   Hepatitis C Screening  Completed   HPV VACCINES  Aged Out   Meningococcal B Vaccine  Aged Out   Pneumococcal Vaccine  Discontinued    Health Maintenance  Health Maintenance Due  Topic Date Due   Diabetic kidney evaluation - Urine ACR  Never done   Zoster Vaccines- Shingrix (1 of 2) Never done   Colonoscopy  08/08/2019   OPHTHALMOLOGY EXAM  11/12/2023   INFLUENZA VACCINE  05/27/2024   Health Maintenance Items Addressed: Yes Patient is due for the following care gaps: Colonoscopy, Diabetic Eye Exam, Urine ACR, Labwork and Shingrix vaccine.  Additional Screening:  Vision Screening: Recommended annual ophthalmology exams for early detection of glaucoma and other disorders of the eye. Would you like a referral to an eye doctor? Yes    Dental Screening: Recommended annual dental exams for proper oral hygiene  Community Resource Referral / Chronic Care Management: CRR required this visit?  No   CCM required this visit?  No   Plan:    I have personally reviewed and noted the following in the  patient's chart:   Medical and social history Use of alcohol, tobacco or illicit drugs  Current medications and supplements including opioid prescriptions. Patient is not currently taking opioid prescriptions. Functional ability and status Nutritional status Physical activity Advanced directives List of other physicians Hospitalizations, surgeries, and ER visits in previous 12 months Vitals Screenings to include cognitive, depression, and falls Referrals and appointments  In addition, I have reviewed and discussed with patient certain preventive protocols, quality metrics, and best practice recommendations. A written personalized care plan for preventive services as well as general preventive health recommendations were provided to patient.   Roz LOISE Fuller, LPN   [id number]/[id number]   After Visit Summary: (MyChart) Due to this being a telephonic visit, the after visit summary with patients personalized plan was offered to patient via MyChart   Notes: Patient is due for the following care gaps: Colonoscopy, Diabetic Eye Exam, Urine ACR, Labwork and Shingrix vaccine. Patient is suffering from severe depression with no indication of suicide.

## 2024-06-17 ENCOUNTER — Encounter: Admitting: Family Medicine

## 2024-06-17 NOTE — Progress Notes (Deleted)
    SUBJECTIVE:   CHIEF COMPLAINT / HPI:   ***  Noted to have abnormal depression screening recently with annual wellness visit.  Reportedly very isolated and does not leave her apartment. She is currently prescribed Wellbutrin  300 mg daily, clonidine  at night, Remeron  at night She used to be on Depakote  750 mg daily***  PERTINENT  PMH / PSH: ***  OBJECTIVE:   There were no vitals taken for this visit.  ***  ASSESSMENT/PLAN:   Assessment & Plan    Payton Coward, MD [facility]

## 2024-06-22 ENCOUNTER — Telehealth (HOSPITAL_COMMUNITY): Payer: Self-pay | Admitting: Family Medicine

## 2024-06-22 NOTE — Telephone Encounter (Signed)
 Patient called and cancelled echocardiogram x 3. She will call back to reschedule. Order will be removed from the echo WQ. Thank you.

## 2024-06-23 ENCOUNTER — Ambulatory Visit (HOSPITAL_COMMUNITY)

## 2024-06-28 ENCOUNTER — Other Ambulatory Visit: Payer: Self-pay

## 2024-06-28 ENCOUNTER — Other Ambulatory Visit: Payer: Self-pay | Admitting: Internal Medicine

## 2024-06-28 ENCOUNTER — Other Ambulatory Visit (HOSPITAL_COMMUNITY): Payer: Self-pay | Admitting: Psychiatry

## 2024-06-28 DIAGNOSIS — F419 Anxiety disorder, unspecified: Secondary | ICD-10-CM

## 2024-06-28 DIAGNOSIS — F330 Major depressive disorder, recurrent, mild: Secondary | ICD-10-CM

## 2024-06-29 MED ORDER — SPIRONOLACTONE 25 MG PO TABS: 25.0000 mg | ORAL_TABLET | Freq: Every day | ORAL | 2 refills | Status: AC

## 2024-06-29 MED ORDER — GLIPIZIDE 5 MG PO TABS: 5.0000 mg | ORAL_TABLET | Freq: Every day | ORAL | 3 refills | Status: DC

## 2024-06-29 MED ORDER — EZETIMIBE 10 MG PO TABS: 10.0000 mg | ORAL_TABLET | Freq: Every day | ORAL | 2 refills | Status: AC

## 2024-07-04 ENCOUNTER — Telehealth (HOSPITAL_BASED_OUTPATIENT_CLINIC_OR_DEPARTMENT_OTHER): Admitting: Psychiatry

## 2024-07-04 ENCOUNTER — Encounter (HOSPITAL_COMMUNITY): Payer: Self-pay | Admitting: Psychiatry

## 2024-07-04 VITALS — Wt 161.0 lb

## 2024-07-04 DIAGNOSIS — F419 Anxiety disorder, unspecified: Secondary | ICD-10-CM | POA: Diagnosis not present

## 2024-07-04 DIAGNOSIS — F330 Major depressive disorder, recurrent, mild: Secondary | ICD-10-CM | POA: Diagnosis not present

## 2024-07-04 MED ORDER — BUPROPION HCL ER (XL) 300 MG PO TB24: 300.0000 mg | ORAL_TABLET | ORAL | 0 refills | Status: DC

## 2024-07-04 MED ORDER — GABAPENTIN 300 MG PO CAPS: 300.0000 mg | ORAL_CAPSULE | Freq: Every day | ORAL | 0 refills | Status: DC

## 2024-07-04 MED ORDER — MIRTAZAPINE 30 MG PO TABS: ORAL_TABLET | ORAL | 0 refills | Status: DC

## 2024-07-04 NOTE — Progress Notes (Signed)
 [facility] MD Virtual Progress Note   Patient Location: Home Provider Location: Home Office  I connect with patient by video and verified that I am speaking with correct person by using two identifiers. I discussed the limitations of evaluation and management by telemedicine and the availability of in person appointments. I also discussed with the patient that there may be a patient responsible charge related to this service. The patient expressed understanding and agreed to proceed.  [name] [id number] 73 y.o.  07/04/2024 1:09 PM  History of Present Illness:  Patient is evaluated by video session.  She is taking all her medication as prescribed and reported they are working.  She is sleeping good with gabapentin  and denies any nightmares, flashback.  She has a good relationship with her daughter and last night she has invited her daughter for dinner and had a good time.  She reported her foot pain is also better and her appointment with primary care is coming up very soon and she will have blood work.  She denies any hallucination, paranoia, suicidal thoughts or homicidal thoughts.  She denies any hopelessness or worthlessness.  She admitted few pounds weight gain but otherwise she reported things are going very well.  She denies any tremors or shakes or any EPS.  She is pleased that insurance now provide car ride so she can go to the doctor's appointment.  Her last hemoglobin A1c 7.5 and creatinine 1.21 but hoping to have a better numbers on her next appointment which is coming up very soon.  She denies any major panic attack.  She liked the combination of current medicine.  Past Psychiatric History: H/O inpatient treatment at [facility] to detox from Xanax .  Saw Psychiatrist in [place], [place]  and [facility].  Tried Paxil, Prozac, vistaril  and Zoloft.  No h/o suicidal attempt or any psychosis.   Past Medical History:  Diagnosis Date    Adenomatous colon polyp    Allergy    Allergy to environmental factors 02/09/2020   Anxiety    Asthma    Back pain with radiation    radiopathy    Bilateral carpal tunnel syndrome 10/27/2010   Qualifier: Diagnosis of  By: Antonetta MD, [name]     Cataract    FORMING    Cellulitis of left upper extremity 02/09/2020   COPD (chronic obstructive pulmonary disease) (HCC)    Corneal abrasion, left 04/28/2013   Denture sore mouth 08/29/2021   Patient reports that she cannot wear her top dentures due to soreness in her mouth. She reports that this has been going on since her upper teeth were extracted. She requests referral to oral surgeon.   Depression    Diabetes mellitus without complication (HCC)    ON METFORMIN     Dizziness 09/07/2018   GERD (gastroesophageal reflux disease)    Glaucoma    History of tobacco abuse 08/18/2008   HSV-2 (herpes simplex virus 2) infection 02/29/2016   Dx 2014, + serology   Hx of migraines    Hyperlipidemia    Hyperlipidemia associated with type 2 diabetes mellitus (HCC) 05/17/2012   Hypertension    on meds   Itching in the vaginal area 05/17/2015   Nicotine  addiction    PANIC ATTACK 11/05/2007   PELVIC  PAIN 08/13/2010   Plantar wart of right foot 09/14/2019   Skin lesions 12/08/2013   Has undergone extensive however incomplete autoimmune workup that has been unrevealing at this time  Concern for  sarcoidosis versus SLE versus other connective tissue disorder including drug related SLE like syndrome     Snoring 08/29/2021   Type 2 diabetes mellitus with hyperglycemia (HCC) 11/22/2021   Urinary incontinence    Well controlled type 2 diabetes mellitus (HCC) 02/24/2018    Outpatient Encounter Medications as of 07/04/2024  Medication Sig   acetaminophen  (TYLENOL ) 500 MG tablet Take 1,000 mg by mouth every 8 (eight) hours as needed for moderate pain.   Alcohol Swabs (ALCOHOL PADS) 70 % PADS SWAB TO cleanse AREA EVERY DAY prior TO blood glucose check    alendronate  (FOSAMAX ) 70 MG tablet TAKE 1 TABLET BY MOUTH EVERY 7 DAY IN THE MORNING WITH A FULL GLASS OF WATER ON AN EMPTY STOMACH. DO NOT TAKE ANYTHING ELSE OR LIE DOWN FOR 30 MINUTES.   amLODipine  (NORVASC ) 10 MG tablet TAKE 1 Tablet BY MOUTH ONCE DAILY at bedtime   atorvastatin  (LIPITOR) 80 MG tablet TAKE 1 TABLET(80 MG) BY MOUTH DAILY   Blood Glucose Monitoring Suppl (ACCU-CHEK GUIDE ME) w/Device KIT 1 Device by Does not apply route daily in the afternoon.   Blood Pressure Monitoring (BLOOD PRESSURE KIT) DEVI Please measure BP at same time each day   buPROPion  (WELLBUTRIN  XL) 300 MG 24 hr tablet Take 1 tablet (300 mg total) by mouth every morning.   clobetasol  ointment (TEMOVATE ) 0.05 % APPLY TOPICALLY TO THE AFFECTED AREA TWICE DAILY   cloNIDine  (CATAPRES ) 0.2 MG tablet take 1 Tablet by mouth at bedtime   clotrimazole  (LOTRIMIN ) 1 % cream Apply 1 application. topically 2 (two) times daily.   Easy Comfort Lancets MISC Use with glucose device to check blood glucose once daily   erythromycin  ophthalmic ointment Place a 1/2 inch ribbon of ointment into the lower eyelid.   ezetimibe  (ZETIA ) 10 MG tablet Take 1 tablet (10 mg total) by mouth daily.   famotidine  (PEPCID ) 20 MG tablet Take 1 Tablet by mouth once daily   fluticasone  (FLONASE ) 50 MCG/ACT nasal spray Place into the nose.   gabapentin  (NEURONTIN ) 300 MG capsule Take 1 capsule (300 mg total) by mouth at bedtime.   glipiZIDE  (GLUCOTROL ) 5 MG tablet Take 1 tablet (5 mg total) by mouth daily before breakfast.   glucose blood test strip Use to check blood glucose once daily in the evening   hydrochlorothiazide  (HYDRODIURIL ) 12.5 MG tablet TAKE ONE TABLET BY MOUTH EVERY DAY   latanoprost (XALATAN) 0.005 % ophthalmic solution Place 1 drop into both eyes at bedtime.   LINZESS  145 MCG CAPS capsule TAKE 1 Capsule BY MOUTH ONCE DAILY BEFORE breakfast   loratadine  (CLARITIN  REDITABS) 10 MG dissolvable tablet Take 1 tablet (10 mg total) by mouth  daily. As needed for allergy symptoms   losartan  (COZAAR ) 50 MG tablet TAKE 1 Tablet BY MOUTH ONCE DAILY   metFORMIN  (GLUCOPHAGE ) 1000 MG tablet TAKE 1 Tablet BY MOUTH ONCE DAILY WITH BREAKFAST   mirtazapine  (REMERON ) 30 MG tablet TAKE 1 TABLET(30 MG) BY MOUTH AT BEDTIME   propranolol  (INDERAL ) 40 MG tablet TAKE ONE TABLET BY MOUTH TWICE DAILY   spironolactone  (ALDACTONE ) 25 MG tablet Take 1 tablet (25 mg total) by mouth at bedtime.   No facility-administered encounter medications on file as of 07/04/2024.    No results found for this or any previous visit (from the past 2160 hours).   Psychiatric Specialty Exam: Physical Exam  Review of Systems  Weight 161 lb (73 kg).There is no height or weight on file to calculate BMI.  General Appearance:  Casual  Eye Contact:  Good  Speech:  Clear and Coherent  Volume:  Normal  Mood:  Euthymic  Affect:  Congruent  Thought Process:  Goal Directed  Orientation:  Full (Time, Place, and Person)  Thought Content:  Logical  Suicidal Thoughts:  No  Homicidal Thoughts:  No  Memory:  Immediate;   Good Recent;   Good Remote;   Good  Judgement:  Good  Insight:  Present  Psychomotor Activity:  Normal  Concentration:  Concentration: Fair and Attention Span: Fair  Recall:  Good  Fund of Knowledge:  Good  Language:  Good  Akathisia:  No  Handed:  Right  AIMS (if indicated):     Assets:  Communication Skills Desire for Improvement Housing Social Support Transportation  ADL's:  Intact  Cognition:  WNL  Sleep:  ok       06/09/2024    1:44 PM 04/27/2024   11:05 AM 04/21/2024    4:02 PM 04/07/2024    3:18 PM 02/18/2024    2:16 PM  Depression screen PHQ 2/9  Decreased Interest 3 1 1 1 1   Down, Depressed, Hopeless 3 1 1 1 1   PHQ - 2 Score 6 2 2 2 2   Altered sleeping 3  1 1 1   Tired, decreased energy 3 3 1 2 3   Change in appetite 3 3 0 1 3  Feeling bad or failure about yourself  1 1 0 0 0  Trouble concentrating 0 0 0 0 0  Moving slowly or  fidgety/restless 1 1 1  0 1  Suicidal thoughts 0 0 0 0 0  PHQ-9 Score 17  5 6 10   Difficult doing work/chores Somewhat difficult Somewhat difficult  Somewhat difficult Very difficult    Assessment/Plan: Major depressive disorder, recurrent episode, mild (HCC) - Plan: buPROPion  (WELLBUTRIN  XL) 300 MG 24 hr tablet, mirtazapine  (REMERON ) 30 MG tablet  Anxiety - Plan: buPROPion  (WELLBUTRIN  XL) 300 MG 24 hr tablet, gabapentin  (NEURONTIN ) 300 MG capsule  Patient is 73 year old female with history of diabetes, hyperlipidemia, chronic pain, hypertension, peripheral artery disease, migraine, major depressive disorder and anxiety.  Currently stable on her current medication.  She reported relationship with her daughter is going very well.  She is not interested in therapy.  Continue gabapentin  300 mg at bedtime, mirtazapine  30 mg at bedtime and Wellbutrin  XL 300 mg daily.  Recommend to keep the appointment with primary care and blood work.  Recommend to call back if she has any question or any concern.  Follow-up in 3 months.   Follow Up Instructions:     I discussed the assessment and treatment plan with the patient. The patient was provided an opportunity to ask questions and all were answered. The patient agreed with the plan and demonstrated an understanding of the instructions.   The patient was advised to call back or seek an in-person evaluation if the symptoms worsen or if the condition fails to improve as anticipated.    Collaboration of Care: Other provider involved in patient's care AEB notes are available in epic to review  Patient/Guardian was advised Release of Information must be obtained prior to any record release in order to collaborate their care with an outside provider. Patient/Guardian was advised if they have not already done so to contact the registration department to sign all necessary forms in order for us  to release information regarding their care.   Consent:  Patient/Guardian gives verbal consent for treatment and assignment of benefits for services provided during  this visit. Patient/Guardian expressed understanding and agreed to proceed.     Total encounter time 11 minutes which includes face-to-face time, chart reviewed, care coordination, order entry and documentation during this encounter.   Note: This document was prepared by [employer] voice dictation technology and any errors that results from this process are unintentional.    Leni ONEIDA Client, MD 07/04/2024

## 2024-07-13 ENCOUNTER — Telehealth: Payer: Self-pay | Admitting: *Deleted

## 2024-07-13 NOTE — Progress Notes (Unsigned)
 Complex Care Management Care Guide Note  07/13/2024 Name: [name] MRN: [id number] DOB: Jul 23, 1951  [name] is a 73 y.o. year old female who is a primary care patient of Romelle Booty, MD and is actively engaged with the care management team. I reached out to [facility] by phone today to assist with re-scheduling  with the RN Case Manager.  Follow up plan: Unsuccessful telephone outreach attempt made. A HIPAA compliant phone message was left for the patient providing contact information and requesting a return call.  Thedford Franks, CMA [place]  [facility], [facility] Guide Direct Dial: [phone]  Fax: [phone] Website: [email or web address]

## 2024-07-14 NOTE — Progress Notes (Signed)
 Complex Care Management Care Guide Note  07/14/2024 Name: [name] MRN: [id number] DOB: 1951-09-16  [name] is a 73 y.o. year old female who is a primary care patient of Romelle Booty, MD and is actively engaged with the care management team. I reached out to [facility] by phone today to assist with re-scheduling  with the RN Case Manager.  Follow up plan: Telephone appointment with complex care management team member scheduled for:  07/22/2024  Thedford Franks, CMA [place]  [facility], [facility] Guide Direct Dial: [phone]  Fax: [phone] Website: [email or web address]

## 2024-07-19 ENCOUNTER — Telehealth: Payer: Self-pay

## 2024-07-19 NOTE — Patient Outreach (Signed)
 Complex Care Management Care Guide Note  07/19/2024 Name: PATRICIANN BECHT MRN: [id number] DOB: November 19, 1950  Zenda GORMAN Blumberg is a 73 y.o. year old female who is a primary care patient of Romelle Booty, MD and is actively engaged with the care management team. I reached out to [facility] by phone today to assist with re-scheduling  with the Licensed Clinical Social Worker BSW.  Follow up plan: Unsuccessful telephone outreach attempt made. A HIPAA compliant phone message was left for the patient providing contact information and requesting a return call.  Shereen Gin [facility]  Population Health VBCI Assistant Direct Dial: [phone]  Fax: [phone] Website: [email or web address]

## 2024-07-22 ENCOUNTER — Other Ambulatory Visit: Payer: Self-pay | Admitting: *Deleted

## 2024-07-22 NOTE — Patient Instructions (Signed)
 Visit Information  Thank you for taking time to visit with me today. Please don't hesitate to contact me if I can be of assistance to you before our next scheduled appointment.  Your next care management appointment is by telephone on 08/19/2024 at 3:00 PM  Please call the care guide team at [phone] if you need to cancel, schedule, or reschedule an appointment.   Please call the Suicide and Crisis Lifeline: 988 call the [place]  National Suicide Prevention Lifeline: (780)386-2443 or TTY: 7193194779 TTY 629-761-2744) to talk to a trained counselor call 1-800-273-TALK (toll free, 24 hour hotline) if you are experiencing a Mental Health or Behavioral Health Crisis or need someone to talk to.   Olam Ku, RN, BSN   [facility], [facility] RN Care Manager Direct Dial: [phone]  Fax: [phone]

## 2024-07-22 NOTE — Patient Outreach (Signed)
 Complex Care Management   Visit Note  07/22/2024  Name:  [name] MRN: [id number] DOB: [birth year]  Situation: Referral received for Complex Care Management related to COPD I obtained verbal consent from Patient.  Visit completed with Patient  on the phone  Background:   Past Medical History:  Diagnosis Date   Adenomatous colon polyp    Allergy    Allergy to environmental factors 02/09/2020   Anxiety    Asthma    Back pain with radiation    radiopathy    Bilateral carpal tunnel syndrome 10/27/2010   Qualifier: Diagnosis of  By: Antonetta MD, [name]     Cataract    FORMING    Cellulitis of left upper extremity 02/09/2020   COPD (chronic obstructive pulmonary disease) (HCC)    Corneal abrasion, left 04/28/2013   Denture sore mouth 08/29/2021   Patient reports that she cannot wear her top dentures due to soreness in her mouth. She reports that this has been going on since her upper teeth were extracted. She requests referral to oral surgeon.   Depression    Diabetes mellitus without complication (HCC)    ON METFORMIN     Dizziness 09/07/2018   GERD (gastroesophageal reflux disease)    Glaucoma    History of tobacco abuse 08/18/2008   HSV-2 (herpes simplex virus 2) infection 02/29/2016   Dx 2014, + serology   Hx of migraines    Hyperlipidemia    Hyperlipidemia associated with type 2 diabetes mellitus (HCC) 05/17/2012   Hypertension    on meds   Itching in the vaginal area 05/17/2015   Nicotine  addiction    PANIC ATTACK 11/05/2007   PELVIC  PAIN 08/13/2010   Plantar wart of right foot 09/14/2019   Skin lesions 12/08/2013   Has undergone extensive however incomplete autoimmune workup that has been unrevealing at this time  Concern for sarcoidosis versus SLE versus other connective tissue disorder including drug related SLE like syndrome     Snoring 08/29/2021   Type 2 diabetes mellitus with hyperglycemia (HCC) 11/22/2021   Urinary incontinence    Well controlled  type 2 diabetes mellitus (HCC) 02/24/2018    Assessment: Encouraged pt to requested walker with a seat at her upcoming provider's appointment 10/7 for safety and her ongoing imbalance issues. Patient Reported Symptoms:  Cognitive Cognitive Status: No symptoms reported   Health Maintenance Behaviors: Annual physical exam Healing Pattern: Average Health Facilitated by: Rest, Healthy diet, Prayer/meditation  Neurological Neurological Review of Symptoms: No symptoms reported Neurological Management Strategies: Medication therapy, Routine screening  HEENT HEENT Symptoms Reported: No symptoms reported HEENT Management Strategies: Routine screening    Cardiovascular Cardiovascular Symptoms Reported: No symptoms reported Does patient have uncontrolled Hypertension?: No Cardiovascular Management Strategies: Routine screening, Medication therapy Cardiovascular Self-Management Outcome: 4 (good)  Respiratory Respiratory Symptoms Reported: No symptoms reported Respiratory Management Strategies: Routine screening  Endocrine Endocrine Symptoms Reported: No symptoms reported Is patient diabetic?: Yes Is patient checking blood sugars at home?: No List most recent blood sugar readings, include date and time of day: discussed the importance of daily CBG and requested pt to take device to next provider appointment to educate on how to use the device. Endocrine Self-Management Outcome: 3 (uncertain)  Gastrointestinal Gastrointestinal Symptoms Reported: No symptoms reported      Genitourinary Genitourinary Symptoms Reported: No symptoms reported Additional Genitourinary Details: Patient express occassional odor to her vaginal area-encouraged increased her hygiene regimen with antibacteria soap twice daily, cotton breathable underwear, practice good perineal hygiene (  wipe front to back) and if odor persistent or accompanied by itching, discharge, burning or irritation seek medical attention     Integumentary Integumentary Symptoms Reported: Rash Additional Integumentary Details: Ongoing rash to right wrist and leg-pending dermatologist on 9/29 to address. Skin Management Strategies: Routine screening, Coping strategies  Musculoskeletal Musculoskelatal Symptoms Reviewed: No symptoms reported   Falls in the past year?: Yes Number of falls in past year: 2 or more Was there an injury with Fall?: No Fall Risk Category Calculator: 2 Patient Fall Risk Level: Moderate Fall Risk Patient at Risk for Falls Due to: Impaired balance/gait Fall risk Follow up: Falls prevention discussed  Psychosocial Psychosocial Symptoms Reported: No symptoms reported Additional Psychological Details: Pt reports she f/u with her psychiatrist regularly and involved with LCSW with [facility] Behavioral Management Strategies: Coping strategies, Counseling Behavioral Health Self-Management Outcome: 4 (good)   Quality of Family Relationships: supportive, involved, helpful Do you feel physically threatened by others?: No     There were no vitals filed for this visit.  Medications Reviewed Today     Reviewed by Alvia Olam BIRCH, RN (Registered Nurse) on 07/22/24 at 1414  Med List Status: <None>   Medication Order Taking? Sig Documenting Provider Last Dose Status Informant  acetaminophen  (TYLENOL ) 500 MG tablet [id number] Yes Take 1,000 mg by mouth every 8 (eight) hours as needed for moderate pain. [provider]  Active Self  Alcohol Swabs (ALCOHOL PADS) 70 % PADS [id number] Yes SWAB TO cleanse AREA EVERY DAY prior TO blood glucose check Shamleffer, Ibtehal Jaralla, MD  Active   alendronate  (FOSAMAX ) 70 MG tablet [id number] Yes TAKE 1 TABLET BY MOUTH EVERY 7 DAY IN THE MORNING WITH A FULL GLASS OF WATER ON AN EMPTY STOMACH. DO NOT TAKE ANYTHING ELSE OR LIE DOWN FOR 30 MINUTES. Romelle Booty, MD  Active   amLODipine  (NORVASC ) 10 MG tablet [id number] Yes TAKE 1 Tablet BY MOUTH ONCE DAILY at bedtime Dameron,  Marisa, DO  Active   atorvastatin  (LIPITOR) 80 MG tablet [id number] Yes TAKE 1 TABLET(80 MG) BY MOUTH DAILY Hope Merle, MD  Active   Blood Glucose Monitoring Suppl (ACCU-CHEK GUIDE ME) w/Device KIT [id number] Yes 1 Device by Does not apply route daily in the afternoon. Shamleffer, Donell Cardinal, MD  Active   Blood Pressure Monitoring (BLOOD PRESSURE KIT) DEVI [id number] Yes Please measure BP at same time each day Hope Merle, MD  Active Self  buPROPion  (WELLBUTRIN  XL) 300 MG 24 hr tablet [id number] Yes Take 1 tablet (300 mg total) by mouth every morning. Arfeen, Syed T, MD  Active   clobetasol  ointment (TEMOVATE ) 0.05 % [id number] Yes APPLY TOPICALLY TO THE AFFECTED AREA TWICE DAILY Hope Merle, MD  Active   cloNIDine  (CATAPRES ) 0.2 MG tablet [id number] Yes take 1 Tablet by mouth at bedtime Dameron, Marisa, DO  Active   clotrimazole  (LOTRIMIN ) 1 % cream [id number] Yes Apply 1 application. topically 2 (two) times daily. Hope Merle, MD  Active   Easy Comfort Lancets MISC [id number] Yes Use with glucose device to check blood glucose once daily Dameron, Marisa, DO  Active   erythromycin  ophthalmic ointment [id number]  Place a 1/2 inch ribbon of ointment into the lower eyelid.  Patient not taking: Reported on 07/22/2024   Kingsley, Victoria K, DO  Active   ezetimibe  (ZETIA ) 10 MG tablet [id number] Yes Take 1 tablet (10 mg total) by mouth daily. Romelle Booty, MD  Active   famotidine  (PEPCID ) 20 MG tablet [id number] Yes Take 1 Tablet by mouth once  daily Dameron, Marisa, DO  Active   fluticasone  (FLONASE ) 50 MCG/ACT nasal spray [id number] Yes Place into the nose. [provider]  Active   gabapentin  (NEURONTIN ) 300 MG capsule [id number] Yes Take 1 capsule (300 mg total) by mouth at bedtime. Arfeen, Syed T, MD  Active   glipiZIDE  (GLUCOTROL ) 5 MG tablet [id number] Yes Take 1 tablet (5 mg total) by mouth daily before breakfast. Romelle Booty, MD  Active   glucose blood test strip [id number] Yes Use to check  blood glucose once daily in the evening Dameron, Marisa, DO  Active   hydrochlorothiazide  (HYDRODIURIL ) 12.5 MG tablet [id number] Yes TAKE ONE TABLET BY MOUTH EVERY DAY Dameron, Marisa, DO  Active   latanoprost (XALATAN) 0.005 % ophthalmic solution [id number] Yes Place 1 drop into both eyes at bedtime. [provider]  Active Self  LINZESS  145 MCG CAPS capsule [id number] Yes TAKE 1 Capsule BY MOUTH ONCE DAILY BEFORE breakfast Dameron, Barabara, DO  Active   loratadine  (CLARITIN  REDITABS) 10 MG dissolvable tablet [id number] Yes Take 1 tablet (10 mg total) by mouth daily. As needed for allergy symptoms Hensel, Elsie LABOR, MD  Active Self  losartan  (COZAAR ) 50 MG tablet [id number] Yes TAKE 1 Tablet BY MOUTH ONCE DAILY Dameron, Marisa, DO  Active   metFORMIN  (GLUCOPHAGE ) 1000 MG tablet [id number] Yes TAKE 1 Tablet BY MOUTH ONCE DAILY WITH BREAKFAST Shamleffer, Ibtehal Jaralla, MD  Active   mirtazapine  (REMERON ) 30 MG tablet [id number] Yes TAKE 1 TABLET(30 MG) BY MOUTH AT BEDTIME Arfeen, Leni DASEN, MD  Active   propranolol  (INDERAL ) 40 MG tablet [id number] Yes TAKE ONE TABLET BY MOUTH TWICE DAILY Dameron, Marisa, DO  Active   spironolactone  (ALDACTONE ) 25 MG tablet [id number] Yes Take 1 tablet (25 mg total) by mouth at bedtime. Romelle Booty, MD  Active   Med List Note Earla Ozell BIRCH, DO 09/14/12 2000): Remeron  & Wellbutrin  Rx by Dr. Leni Client            Recommendation:   PCP Follow-up Specialty provider follow-up pending 08/02/2024 MD for dyspnea on extertion Continue Current Plan of Care  Follow Up Plan:   Telephone follow up appointment date/time:  08/19/2024 @ 3:00 pm   Olam Ku, RN, BSN [place]  [facility], [facility] RN Care Manager Direct Dial: [phone]  Fax: [phone]

## 2024-07-26 NOTE — Patient Outreach (Signed)
 Complex Care Management Care Guide Note  07/26/2024 Name: [name] MRN: [id number] DOB: 03/27/1951  [name] is a 73 y.o. year old female who is a primary care patient of Romelle Booty, MD and is actively engaged with the care management team. I reached out to [facility] by phone today to assist with re-scheduling  with the Licensed Clinical Child psychotherapist.  Follow up plan: 3rd Unsuccessful telephone outreach attempt made. A HIPAA compliant phone message was left for the patient providing contact information and requesting a return call. No further outreach attempts will be made at this time.  [name] [facility]  Population Health VBCI Assistant Direct Dial: [phone]  Fax: [phone] Website: [email or web address]

## 2024-07-29 NOTE — Progress Notes (Deleted)
 Cardiology Office Note:   Date:  07/29/2024  ID:  [name], DOB 1951-02-21, MRN [id number] PCP:  Romelle Booty, MD  [facility] Providers Cardiologist:  Wendel Haws, MD Referring MD: Romelle Booty, MD  Chief Complaint/Reason for Referral: Dyspnea ASSESSMENT:    1. Dyspnea on exertion   2. Type 2 diabetes mellitus with complication, without long-term current use of insulin (HCC)   3. Hypertension associated with diabetes (HCC)   4. Hyperlipidemia associated with type 2 diabetes mellitus (HCC)   5. Stage 3a chronic kidney disease (HCC)   6. BMI 30.0-30.9,adult     PLAN:   In order of problems listed above: Dyspnea on exertion:  We will obtain a coronary CTA and echocardiogram to evaluate further.  If the patient has mild obstructive coronary artery disease, they will require a statin (with goal LDL < 70) and aspirin, if they have high-grade disease we will need to consider optimal medical therapy and if symptoms are refractory to medical therapy, then a cardiac catheterization with possible PCI will be pursued to alleviate symptoms.  If they have high risk disease we will proceed directly to cardiac catheterization.   T2DM: Start Plavix 75 mg instead of aspirin, continue atorvastatin  80 mg, losartan  50 mg, and start Jardiance  10 mg daily*** Hypertension: Continue amlodipine  10 mg, hydrochlorothiazide  12.5 mg, losartan  50 mg, spironolactone  25 mg, propranolol  40 mg twice daily*** Hyperlipidemia: Continue atorvastatin  80 mg, Zetia  10 mg.  Check lipid panel, LFTs, LP(a) today.*** CKD stage IIIa: Continue losartan  50 mg, start Jardiance  10 mg*** Elevated BMI: Refer to Pharm.D. for GLP-1 receptor agonist therapy***        {Are you ordering a CV Procedure (e.g. stress test, cath, DCCV, TEE, etc)?   Press F2        :[id number]}   Dispo:  No follow-ups on file.       I spent *** minutes reviewing all clinical data during and prior to this visit including all relevant  imaging studies, laboratories, clinical information from other health systems and prior notes from both Cardiology and other specialties, interviewing the patient, conducting a complete physical examination, and coordinating care in order to formulate a comprehensive and personalized evaluation and treatment plan.   History of Present Illness:    [name] Defers aspirin due to history of GI bleeding Hypertension Hyperlipidemia Aortic atherosclerosis CT abdomen pelvis 2008 CKD stage IIIA BMI 25 August 2024:  Patient consents to use of AI scribe. The patient is a 73 year old female with above listed medical problems referred for recommendations regarding shortness of breath.  The patient was seen by primary care in April of this year.  She noted increasing dyspnea on exertion.  Due to a family history of coronary artery disease she was referred for recommendations.     Current Medications: No outpatient medications have been marked as taking for the 08/02/24 encounter (Appointment) with Duke Weisensel K, MD.     Review of Systems:   Please see the history of present illness.    All other systems reviewed and are negative.     EKGs/Labs/Other Test Reviewed:   EKG: 2025 normal sinus rhythm nonspecific ST and T wave changes  EKG Interpretation Date/Time:    Ventricular Rate:    PR Interval:    QRS Duration:    QT Interval:    QTC Calculation:   R Axis:      Text Interpretation:  CARDIAC STUDIES: Refer to CV Procedures and Imaging Tabs   Risk Assessment/Calculations:   {Does this patient have ATRIAL FIBRILLATION?[phone]}      Physical Exam:   VS:  There were no vitals taken for this visit.   No BP recorded.  {Refresh Note OR Click here to enter BP  :1}***   Wt Readings from Last 3 Encounters:  06/09/24 156 lb (70.8 kg)  04/21/24 157 lb 9.6 oz (71.5 kg)  02/18/24 160 lb (72.6 kg)      GENERAL:  No apparent  distress, AOx3 HEENT:  No carotid bruits, +2 carotid impulses, no scleral icterus CAR: RRR Irregular RR*** no murmurs***, gallops, rubs, or thrills RES:  Clear to auscultation bilaterally ABD:  Soft, nontender, nondistended, positive bowel sounds x 4 VASC:  +2 radial pulses, +2 carotid pulses NEURO:  CN 2-12 grossly intact; motor and sensory grossly intact PSYCH:  No active depression or anxiety EXT:  No edema, ecchymosis, or cyanosis  Signed, Meghanne Pletz K Kallee Nam, MD  07/29/2024 3:30 PM    [facility] [address] [place], [place], [place]  [address] Phone: [phone]; Fax: [phone]   Note:  This document was prepared using Dragon voice recognition software and may include unintentional dictation errors.

## 2024-08-02 ENCOUNTER — Ambulatory Visit: Attending: Internal Medicine | Admitting: Internal Medicine

## 2024-08-02 DIAGNOSIS — R0609 Other forms of dyspnea: Secondary | ICD-10-CM

## 2024-08-02 DIAGNOSIS — Z6830 Body mass index (BMI) 30.0-30.9, adult: Secondary | ICD-10-CM

## 2024-08-02 DIAGNOSIS — E118 Type 2 diabetes mellitus with unspecified complications: Secondary | ICD-10-CM

## 2024-08-02 DIAGNOSIS — E1169 Type 2 diabetes mellitus with other specified complication: Secondary | ICD-10-CM

## 2024-08-02 DIAGNOSIS — N1831 Chronic kidney disease, stage 3a: Secondary | ICD-10-CM

## 2024-08-02 DIAGNOSIS — I152 Hypertension secondary to endocrine disorders: Secondary | ICD-10-CM

## 2024-08-03 ENCOUNTER — Encounter: Payer: Self-pay | Admitting: Internal Medicine

## 2024-08-19 ENCOUNTER — Telehealth: Admitting: *Deleted

## 2024-08-25 ENCOUNTER — Other Ambulatory Visit: Payer: Self-pay | Admitting: Internal Medicine

## 2024-08-25 ENCOUNTER — Other Ambulatory Visit: Payer: Self-pay | Admitting: *Deleted

## 2024-08-25 NOTE — Patient Outreach (Signed)
 Complex Care Management   Visit Note  08/25/2024  Name:  [name] MRN: [id number] DOB: 03-24-1951  Situation: Referral received for Complex Care Management related to COPD and DMII I obtained verbal consent from Patient.  Visit completed with Patient  on the phone  Background:   Past Medical History:  Diagnosis Date   Adenomatous colon polyp    Allergy    Allergy to environmental factors 02/09/2020   Anxiety    Asthma    Back pain with radiation    radiopathy    Bilateral carpal tunnel syndrome 10/27/2010   Qualifier: Diagnosis of  By: Antonetta MD, [name]     Cataract    FORMING    Cellulitis of left upper extremity 02/09/2020   COPD (chronic obstructive pulmonary disease) (HCC)    Corneal abrasion, left 04/28/2013   Denture sore mouth 08/29/2021   Patient reports that she cannot wear her top dentures due to soreness in her mouth. She reports that this has been going on since her upper teeth were extracted. She requests referral to oral surgeon.   Depression    Diabetes mellitus without complication (HCC)    ON METFORMIN     Dizziness 09/07/2018   GERD (gastroesophageal reflux disease)    Glaucoma    History of tobacco abuse 08/18/2008   HSV-2 (herpes simplex virus 2) infection 02/29/2016   Dx 2014, + serology   Hx of migraines    Hyperlipidemia    Hyperlipidemia associated with type 2 diabetes mellitus (HCC) 05/17/2012   Hypertension    on meds   Itching in the vaginal area 05/17/2015   Nicotine  addiction    PANIC ATTACK 11/05/2007   PELVIC  PAIN 08/13/2010   Plantar wart of right foot 09/14/2019   Skin lesions 12/08/2013   Has undergone extensive however incomplete autoimmune workup that has been unrevealing at this time  Concern for sarcoidosis versus SLE versus other connective tissue disorder including drug related SLE like syndrome     Snoring 08/29/2021   Type 2 diabetes mellitus with hyperglycemia (HCC) 11/22/2021   Urinary incontinence    Well  controlled type 2 diabetes mellitus (HCC) 02/24/2018    Assessment: Entered pt into another program DMII as she has agreed to take her CBG with in-person education at the PCP office tomorrow for how to take her CBGs with her home device. Patient Reported Symptoms:  Cognitive Cognitive Status: Able to follow simple commands, Alert and oriented to person, place, and time, Normal speech and language skills   Health Maintenance Behaviors: Annual physical exam  Neurological Neurological Review of Symptoms: No symptoms reported    HEENT HEENT Symptoms Reported: No symptoms reported HEENT Management Strategies: Routine screening    Cardiovascular Cardiovascular Symptoms Reported: No symptoms reported Does patient have uncontrolled Hypertension?: No Cardiovascular Management Strategies: Routine screening Cardiovascular Self-Management Outcome: 4 (good)  Respiratory Respiratory Symptoms Reported: No symptoms reported Additional Respiratory Details: Pt pending appointment with her PCP 10/31 and indicated she will obtain her FLU shot at that time. Respiratory Management Strategies: Coping strategies, Medication therapy, Routine screening, Breathing exercise Respiratory Self-Management Outcome: 4 (good)  Endocrine Endocrine Symptoms Reported: No symptoms reported Is patient diabetic?: Yes Is patient checking blood sugars at home?: No List most recent blood sugar readings, include date and time of day: Pt pending appointment 10/31 with PCP and will take her glucose monitor to recieve education on how to use the device once again. Starts she will start taking her glucose checks at that  time for ongoing monitoring    Gastrointestinal Gastrointestinal Symptoms Reported: No symptoms reported      Genitourinary Genitourinary Symptoms Reported: No symptoms reported Additional Genitourinary Details: Issues iwth vaginal hygiene much improved no ongoing issues reported    Integumentary Additional  Integumentary Details: continue to reports ongoing rash to right wrisit and leg -pt will inquire further on appointment tomorrow for additional intervention as the rash continue but has not worsen but has not resolved.  Pt educated to not scratch orirritation the area further to reduce the spread of possible infection. Skin Management Strategies: Coping strategies, Routine screening  Musculoskeletal Musculoskelatal Symptoms Reviewed: No symptoms reported Musculoskeletal Management Strategies: Adequate rest, Routine screening      Psychosocial Psychosocial Symptoms Reported: No symptoms reported Additional Psychological Details: Reports she is having telehealth with her psychiatrist every quarter with her therapist. Behavioral Management Strategies: Coping strategies, Counseling Behavioral Health Self-Management Outcome: 4 (good)        There were no vitals filed for this visit.  Medications Reviewed Today     Reviewed by Alvia Olam BIRCH, RN (Registered Nurse) on 08/25/24 at 1524  Med List Status: <None>   Medication Order Taking? Sig Documenting Provider Last Dose Status Informant  acetaminophen  (TYLENOL ) 500 MG tablet [id number] Yes Take 1,000 mg by mouth every 8 (eight) hours as needed for moderate pain. [provider]  Active Self  Alcohol Swabs (ALCOHOL PADS) 70 % PADS [id number] Yes SWAB TO cleanse AREA EVERY DAY prior TO blood glucose check Shamleffer, Ibtehal Jaralla, MD  Active   alendronate  (FOSAMAX ) 70 MG tablet [id number] Yes TAKE 1 TABLET BY MOUTH EVERY 7 DAY IN THE MORNING WITH A FULL GLASS OF WATER ON AN EMPTY STOMACH. DO NOT TAKE ANYTHING ELSE OR LIE DOWN FOR 30 MINUTES. Romelle Booty, MD  Active   amLODipine  (NORVASC ) 10 MG tablet [id number] Yes TAKE 1 Tablet BY MOUTH ONCE DAILY at bedtime Dameron, Marisa, DO  Active   atorvastatin  (LIPITOR) 80 MG tablet [id number] Yes TAKE 1 TABLET(80 MG) BY MOUTH DAILY Hope Merle, MD  Active   Blood Glucose Monitoring Suppl  (ACCU-CHEK GUIDE ME) w/Device KIT [id number] Yes 1 Device by Does not apply route daily in the afternoon. Shamleffer, Donell Cardinal, MD  Active   Blood Pressure Monitoring (BLOOD PRESSURE KIT) DEVI [id number] Yes Please measure BP at same time each day Hope Merle, MD  Active Self  buPROPion  (WELLBUTRIN  XL) 300 MG 24 hr tablet [id number] Yes Take 1 tablet (300 mg total) by mouth every morning. Arfeen, Syed T, MD  Active   clobetasol  ointment (TEMOVATE ) 0.05 % [id number] Yes APPLY TOPICALLY TO THE AFFECTED AREA TWICE DAILY Hope Merle, MD  Active   cloNIDine  (CATAPRES ) 0.2 MG tablet [id number] Yes take 1 Tablet by mouth at bedtime Dameron, Marisa, DO  Active   clotrimazole  (LOTRIMIN ) 1 % cream [id number]  Apply 1 application. topically 2 (two) times daily. Hope Merle, MD  Active   Easy Comfort Lancets MISC [id number] Yes Use with glucose device to check blood glucose once daily Dartha Geralds, DO  Active   erythromycin  ophthalmic ointment [id number]  Place a 1/2 inch ribbon of ointment into the lower eyelid.  Patient not taking: Reported on 07/22/2024   Kingsley, Victoria K, DO  Active   ezetimibe  (ZETIA ) 10 MG tablet [id number] Yes Take 1 tablet (10 mg total) by mouth daily. Romelle Booty, MD  Active   famotidine  (PEPCID ) 20 MG tablet [id number] Yes Take 1 Tablet by mouth once daily Dameron, Marisa,  DO  Active   fluticasone  (FLONASE ) 50 MCG/ACT nasal spray [id number] Yes Place into the nose. [provider]  Active   gabapentin  (NEURONTIN ) 300 MG capsule [id number] Yes Take 1 capsule (300 mg total) by mouth at bedtime. Curry Leni DASEN, MD  Active   glipiZIDE  (GLUCOTROL ) 5 MG tablet [id number] Yes Take 1 tablet (5 mg total) by mouth daily before breakfast. Romelle Booty, MD  Active   glucose blood test strip [id number] Yes Use to check blood glucose once daily in the evening Dameron, Marisa, DO  Active   hydrochlorothiazide  (HYDRODIURIL ) 12.5 MG tablet [id number] Yes TAKE ONE TABLET BY MOUTH EVERY DAY  Dameron, Marisa, DO  Active   latanoprost (XALATAN) 0.005 % ophthalmic solution [id number] Yes Place 1 drop into both eyes at bedtime. [provider]  Active Self  LINZESS  145 MCG CAPS capsule [id number] Yes TAKE 1 Capsule BY MOUTH ONCE DAILY BEFORE breakfast Dameron, Barabara, DO  Active   loratadine  (CLARITIN  REDITABS) 10 MG dissolvable tablet [id number] Yes Take 1 tablet (10 mg total) by mouth daily. As needed for allergy symptoms Hensel, Elsie LABOR, MD  Active Self  losartan  (COZAAR ) 50 MG tablet [id number] Yes TAKE 1 Tablet BY MOUTH ONCE DAILY Dameron, Marisa, DO  Active   metFORMIN  (GLUCOPHAGE ) 1000 MG tablet [id number] Yes TAKE 1 Tablet BY MOUTH ONCE DAILY WITH BREAKFAST Shamleffer, Ibtehal Jaralla, MD  Active   mirtazapine  (REMERON ) 30 MG tablet [id number] Yes TAKE 1 TABLET(30 MG) BY MOUTH AT BEDTIME Arfeen, Leni DASEN, MD  Active   propranolol  (INDERAL ) 40 MG tablet [id number] Yes TAKE ONE TABLET BY MOUTH TWICE DAILY Dameron, Marisa, DO  Active   spironolactone  (ALDACTONE ) 25 MG tablet [id number] Yes Take 1 tablet (25 mg total) by mouth at bedtime. Romelle Booty, MD  Active   Med List Note Earla Ozell BIRCH, DO 09/14/12 2000): Remeron  & Wellbutrin  Rx by Dr. Leni Curry            Recommendation:   PCP Follow-up Continue Current Plan of Care  Follow Up Plan:   Telephone follow up appointment date/time:  09/20/2024 @ 3:00 pm   Olam Ku, RN, BSN [place]  [facility], [facility] RN Care Manager Direct Dial: [phone]  Fax: [phone]

## 2024-08-25 NOTE — Patient Instructions (Signed)
 Visit Information  Thank you for taking time to visit with me today. Please don't hesitate to contact me if I can be of assistance to you before our next scheduled appointment.  Your next care management appointment is by telephone on 09/20/2024 at 3:00 pm    Please call the care guide team at [phone] if you need to cancel, schedule, or reschedule an appointment.   Please call the Suicide and Crisis Lifeline: 988 call the [place]  National Suicide Prevention Lifeline: 213-095-8696 or TTY: 2071966810 TTY 240-701-0837) to talk to a trained counselor call 1-800-273-TALK (toll free, 24 hour hotline) if you are experiencing a Mental Health or Behavioral Health Crisis or need someone to talk to.   Olam Ku, RN, BSN [place]  [facility], [facility] RN Care Manager Direct Dial: [phone]  Fax: [phone]

## 2024-08-26 ENCOUNTER — Ambulatory Visit: Admitting: Family Medicine

## 2024-09-14 ENCOUNTER — Other Ambulatory Visit: Payer: Self-pay | Admitting: Internal Medicine

## 2024-09-17 NOTE — Progress Notes (Unsigned)
 Cardiology Office Note:   Date:  09/17/2024  ID:  [name], DOB 1950-12-27, MRN [id number] PCP:  Romelle Booty, MD  [facility] Providers Cardiologist:  Wendel Haws, MD Referring MD: Romelle Booty, MD  Chief Complaint/Reason for Referral: Dyspnea on exertion ASSESSMENT:    1. Dyspnea on exertion   2. Type 2 diabetes mellitus with complication, without long-term current use of insulin (HCC)   3. Hyperlipidemia associated with type 2 diabetes mellitus (HCC)   4. Aortic atherosclerosis   5. Hypertension associated with diabetes (HCC)   6. CKD stage 3 due to type 2 diabetes mellitus (HCC)   7. BMI 30.0-30.9,adult     PLAN:   In order of problems listed above: Dyspnea:  We will obtain a coronary CTA and echocardiogram to evaluate further.  If the patient has mild obstructive coronary artery disease, they will require a statin (with goal LDL < 70) and aspirin, if they have high-grade disease we will need to consider optimal medical therapy and if symptoms are refractory to medical therapy, then a cardiac catheterization with possible PCI will be pursued to alleviate symptoms.  If they have high risk disease we will proceed directly to cardiac catheterization.   T2DM: Start aspirin 81 mg, continue atorvastatin  80 mg continue losartan  50 mg, start Jardiance  10 mg daily*** Hyperlipidemia: Continue atorvastatin  80 mg.  Check lipid panel, LFTs, LP(a) today*** Aortic atherosclerosis: Start aspirin 81 mg, continue atorvastatin  80 mg Hypertension: Continue amlodipine  10 mg, clonidine  0.2 mg, hydrochlorothiazide  12.5 mg, losartan  50 mg, spironolactone  25 mg, propranolol  40 mg.  Obtain renal ultrasound.*** CKD stage III: Continue losartan  50 mg, start Jardiance  10 mg*** Elevated BMI: Refer to Pharm.D. for GLP-1 receptor agonist therapy***        {Are you ordering a CV Procedure (e.g. stress test, cath, DCCV, TEE, etc)?   Press F2        :[id number]}   Dispo:  No follow-ups on file.        I spent *** minutes reviewing all clinical data during and prior to this visit including all relevant imaging studies, laboratories, clinical information from other health systems and prior notes from both Cardiology and other specialties, interviewing the patient, conducting a complete physical examination, and coordinating care in order to formulate a comprehensive and personalized evaluation and treatment plan.   History of Present Illness:    [name] Hyperlipidemia Aortic atherosclerosis CT abdomen pelvis 2008 Hypertension CKD stage IIIa BMI 25 September 2024:  Patient consents to use of AI scribe. The patient is 73 year old female with above listed medical problems referred by her PCP regarding dyspnea on exertion.  She had reported 4 pillow orthopnea as well.     Current Medications: No outpatient medications have been marked as taking for the 09/21/24 encounter (Appointment) with Peyten Weare K, MD.     Review of Systems:   Please see the history of present illness.    All other systems reviewed and are negative.     EKGs/Labs/Other Test Reviewed:   EKG: 2025 normal sinus rhythm nonspecific ST and T wave changes  EKG Interpretation Date/Time:    Ventricular Rate:    PR Interval:    QRS Duration:    QT Interval:    QTC Calculation:   R Axis:      Text Interpretation:          CARDIAC STUDIES: Refer to CV Procedures and Imaging Tabs   Risk Assessment/Calculations:   {  Does this patient have ATRIAL FIBRILLATION?:[phone]}      Physical Exam:   VS:  There were no vitals taken for this visit.   No BP recorded.  {Refresh Note OR Click here to enter BP  :1}***   Wt Readings from Last 3 Encounters:  06/09/24 156 lb (70.8 kg)  04/21/24 157 lb 9.6 oz (71.5 kg)  02/18/24 160 lb (72.6 kg)      GENERAL:  No apparent distress, AOx3 HEENT:  No carotid bruits, +2 carotid impulses, no scleral icterus CAR: RRR  Irregular RR*** no murmurs***, gallops, rubs, or thrills RES:  Clear to auscultation bilaterally ABD:  Soft, nontender, nondistended, positive bowel sounds x 4 VASC:  +2 radial pulses, +2 carotid pulses NEURO:  CN 2-12 grossly intact; motor and sensory grossly intact PSYCH:  No active depression or anxiety EXT:  No edema, ecchymosis, or cyanosis  Signed, Rionna Feltes K Donney Caraveo, MD  09/17/2024 5:00 PM    [facility] [address] [place], [place], [place]  [address] Phone: [phone]; Fax: [phone]   Note:  This document was prepared using Dragon voice recognition software and may include unintentional dictation errors.

## 2024-09-20 ENCOUNTER — Telehealth: Payer: Self-pay | Admitting: *Deleted

## 2024-09-20 ENCOUNTER — Encounter: Payer: Self-pay | Admitting: *Deleted

## 2024-09-20 NOTE — Patient Instructions (Signed)
 [name] - I am sorry I was unable to reach you today for our scheduled appointment. I work with Romelle Booty, MD and am calling to support your healthcare needs. Please contact me at [phone] at your earliest convenience. I look forward to speaking with you soon.   Thank you,   Olam Ku, RN, BSN [name]  [facility], [facility] RN Care Manager Direct Dial: [phone]  Fax: [phone]

## 2024-09-21 ENCOUNTER — Ambulatory Visit: Attending: Internal Medicine | Admitting: Internal Medicine

## 2024-09-21 DIAGNOSIS — E1169 Type 2 diabetes mellitus with other specified complication: Secondary | ICD-10-CM

## 2024-09-21 DIAGNOSIS — Z6830 Body mass index (BMI) 30.0-30.9, adult: Secondary | ICD-10-CM

## 2024-09-21 DIAGNOSIS — I700 Atherosclerosis of aorta: Secondary | ICD-10-CM

## 2024-09-21 DIAGNOSIS — R0609 Other forms of dyspnea: Secondary | ICD-10-CM

## 2024-09-21 DIAGNOSIS — N183 Chronic kidney disease, stage 3 unspecified: Secondary | ICD-10-CM

## 2024-09-21 DIAGNOSIS — E118 Type 2 diabetes mellitus with unspecified complications: Secondary | ICD-10-CM

## 2024-09-21 DIAGNOSIS — E1159 Type 2 diabetes mellitus with other circulatory complications: Secondary | ICD-10-CM

## 2024-09-26 ENCOUNTER — Other Ambulatory Visit: Payer: Self-pay | Admitting: Family Medicine

## 2024-09-26 ENCOUNTER — Other Ambulatory Visit: Payer: Self-pay | Admitting: Internal Medicine

## 2024-09-26 ENCOUNTER — Encounter: Payer: Self-pay | Admitting: *Deleted

## 2024-09-26 ENCOUNTER — Telehealth: Admitting: *Deleted

## 2024-09-26 DIAGNOSIS — I10 Essential (primary) hypertension: Secondary | ICD-10-CM

## 2024-09-26 DIAGNOSIS — M8580 Other specified disorders of bone density and structure, unspecified site: Secondary | ICD-10-CM

## 2024-09-26 DIAGNOSIS — K5900 Constipation, unspecified: Secondary | ICD-10-CM

## 2024-09-26 NOTE — Patient Outreach (Signed)
 Complex Care Management   Visit Note  09/26/2024  Name:  [name] MRN: [id number] DOB: 1951/03/11  Situation: RNCM spoke belief with pt today and inquired if this was a good time to completed an assessment. Pt indicated it was not a good time stating she has an headache. RNCM offered to assist as pt verified this is a history and she did not wish for me to intervene at this time. Pt requested to rescheduled today's appointment to next week.  Follow Up Plan:   Telephone follow up appointment date/time:  10/04/2024 @ 1:30 pm   Olam Ku, RN, BSN [place]  [facility], [facility] RN Care Manager Direct Dial: [phone]  Fax: [phone]

## 2024-09-27 MED ORDER — LOSARTAN POTASSIUM 50 MG PO TABS: 50.0000 mg | ORAL_TABLET | Freq: Every day | ORAL | 2 refills | Status: AC

## 2024-09-27 MED ORDER — CLONIDINE HCL 0.2 MG PO TABS: 0.2000 mg | ORAL_TABLET | Freq: Every day | ORAL | 3 refills | Status: AC

## 2024-09-27 MED ORDER — LINACLOTIDE 145 MCG PO CAPS: 145.0000 ug | ORAL_CAPSULE | Freq: Every day | ORAL | 11 refills | Status: AC

## 2024-09-27 MED ORDER — AMLODIPINE BESYLATE 10 MG PO TABS: 10.0000 mg | ORAL_TABLET | Freq: Every day | ORAL | 1 refills | Status: AC

## 2024-10-03 ENCOUNTER — Telehealth (HOSPITAL_COMMUNITY): Admitting: Psychiatry

## 2024-10-03 ENCOUNTER — Encounter (HOSPITAL_COMMUNITY): Payer: Self-pay | Admitting: Psychiatry

## 2024-10-03 VITALS — Wt 161.0 lb

## 2024-10-03 DIAGNOSIS — F419 Anxiety disorder, unspecified: Secondary | ICD-10-CM

## 2024-10-03 DIAGNOSIS — F330 Major depressive disorder, recurrent, mild: Secondary | ICD-10-CM

## 2024-10-03 MED ORDER — MIRTAZAPINE 30 MG PO TABS: ORAL_TABLET | ORAL | 0 refills | Status: AC

## 2024-10-03 MED ORDER — BUPROPION HCL ER (XL) 300 MG PO TB24: 300.0000 mg | ORAL_TABLET | ORAL | 0 refills | Status: AC

## 2024-10-03 MED ORDER — GABAPENTIN 300 MG PO CAPS: 300.0000 mg | ORAL_CAPSULE | Freq: Every day | ORAL | 0 refills | Status: AC

## 2024-10-03 NOTE — Progress Notes (Signed)
 [facility] MD Virtual Progress Note   Patient Location: Home Provider Location: Home Office  I connect with patient by telephone and verified that I am speaking with correct person by using two identifiers. I discussed the limitations of evaluation and management by telemedicine and the availability of in person appointments. I also discussed with the patient that there may be a patient responsible charge related to this service. The patient expressed understanding and agreed to proceed.  [name] [id number] 73 y.o.  10/03/2024 2:16 PM  History of Present Illness:  Patient is evaluated by phone session.  She could not do video session at this time.  She reported things are going okay.  She had a quiet Thanksgiving.  She did not see her daughter who was busy but she feels fine.  She denies any crying spells or any feeling of hopelessness or worthlessness.  She has chronic health issues but manageable.  She denies any suicidal thoughts.  Her sleep is good.  She is not sure when is her next doctor's appointment.  She does get dried from her insurance so she can go to the doctor's appointment.  Her last hemoglobin A1c was 7.5 and creatinine 1.21.  She has no tremor or shakes or any EPS.  She denies any agitation, anger.  She reported eating okay and her weight is unchanged from the past.  She like to keep the current medication.  Past Psychiatric History: H/O inpatient treatment at [facility] to detox from Xanax .  Saw Psychiatrist in [place], [place]  and [facility].  Tried Paxil, Prozac, vistaril  and Zoloft.  No h/o suicidal attempt or any psychosis.   Past Medical History:  Diagnosis Date   Adenomatous colon polyp    Allergy    Allergy to environmental factors 02/09/2020   Anxiety    Asthma    Back pain with radiation    radiopathy    Bilateral carpal tunnel syndrome 10/27/2010   Qualifier: Diagnosis of  By: Antonetta MD, [name]      Cataract    FORMING    Cellulitis of left upper extremity 02/09/2020   COPD (chronic obstructive pulmonary disease) (HCC)    Corneal abrasion, left 04/28/2013   Denture sore mouth 08/29/2021   Patient reports that she cannot wear her top dentures due to soreness in her mouth. She reports that this has been going on since her upper teeth were extracted. She requests referral to oral surgeon.   Depression    Diabetes mellitus without complication (HCC)    ON METFORMIN     Dizziness 09/07/2018   GERD (gastroesophageal reflux disease)    Glaucoma    History of tobacco abuse 08/18/2008   HSV-2 (herpes simplex virus 2) infection 02/29/2016   Dx 2014, + serology   Hx of migraines    Hyperlipidemia    Hyperlipidemia associated with type 2 diabetes mellitus (HCC) 05/17/2012   Hypertension    on meds   Itching in the vaginal area 05/17/2015   Nicotine  addiction    PANIC ATTACK 11/05/2007   PELVIC  PAIN 08/13/2010   Plantar wart of right foot 09/14/2019   Skin lesions 12/08/2013   Has undergone extensive however incomplete autoimmune workup that has been unrevealing at this time  Concern for sarcoidosis versus SLE versus other connective tissue disorder including drug related SLE like syndrome     Snoring 08/29/2021   Type 2 diabetes mellitus with hyperglycemia (HCC) 11/22/2021   Urinary incontinence  Well controlled type 2 diabetes mellitus (HCC) 02/24/2018    Outpatient Encounter Medications as of 10/03/2024  Medication Sig   acetaminophen  (TYLENOL ) 500 MG tablet Take 1,000 mg by mouth every 8 (eight) hours as needed for moderate pain.   Alcohol Swabs (ALCOHOL PADS) 70 % PADS SWAB TO cleanse AREA EVERY DAY prior TO blood glucose check   alendronate  (FOSAMAX ) 70 MG tablet take 1 TABLET BY MOUTH EVERY 7 DAYS IN THE MORNING with a full GLASS of water ON an empty stomach. DO not take anything else OR lie down FOR 30 minutes.   amLODipine  (NORVASC ) 10 MG tablet Take 1 tablet (10 mg total)  by mouth at bedtime.   atorvastatin  (LIPITOR) 80 MG tablet TAKE 1 TABLET(80 MG) BY MOUTH DAILY   Blood Glucose Monitoring Suppl (ACCU-CHEK GUIDE ME) w/Device KIT 1 Device by Does not apply route daily in the afternoon.   Blood Pressure Monitoring (BLOOD PRESSURE KIT) DEVI Please measure BP at same time each day   buPROPion  (WELLBUTRIN  XL) 300 MG 24 hr tablet Take 1 tablet (300 mg total) by mouth every morning.   clobetasol  ointment (TEMOVATE ) 0.05 % APPLY TOPICALLY TO THE AFFECTED AREA TWICE DAILY   cloNIDine  (CATAPRES ) 0.2 MG tablet Take 1 tablet (0.2 mg total) by mouth at bedtime.   clotrimazole  (LOTRIMIN ) 1 % cream Apply 1 application. topically 2 (two) times daily.   Easy Comfort Lancets MISC Use with glucose device to check blood glucose once daily   erythromycin  ophthalmic ointment Place a 1/2 inch ribbon of ointment into the lower eyelid. (Patient not taking: Reported on 07/22/2024)   ezetimibe  (ZETIA ) 10 MG tablet Take 1 tablet (10 mg total) by mouth daily.   famotidine  (PEPCID ) 20 MG tablet Take 1 Tablet by mouth once daily   fluticasone  (FLONASE ) 50 MCG/ACT nasal spray Place into the nose.   gabapentin  (NEURONTIN ) 300 MG capsule Take 1 capsule (300 mg total) by mouth at bedtime.   glipiZIDE  (GLUCOTROL ) 5 MG tablet Take 1 tablet (5 mg total) by mouth daily before breakfast.   glucose blood test strip Use to check blood glucose once daily in the evening   hydrochlorothiazide  (HYDRODIURIL ) 12.5 MG tablet TAKE ONE TABLET BY MOUTH EVERY DAY   latanoprost (XALATAN) 0.005 % ophthalmic solution Place 1 drop into both eyes at bedtime.   linaclotide  (LINZESS ) 145 MCG CAPS capsule Take 1 capsule (145 mcg total) by mouth daily before breakfast.   loratadine  (CLARITIN  REDITABS) 10 MG dissolvable tablet Take 1 tablet (10 mg total) by mouth daily. As needed for allergy symptoms   losartan  (COZAAR ) 50 MG tablet Take 1 tablet (50 mg total) by mouth daily.   metFORMIN  (GLUCOPHAGE ) 1000 MG tablet TAKE 1  Tablet BY MOUTH ONCE DAILY WITH BREAKFAST   mirtazapine  (REMERON ) 30 MG tablet TAKE 1 TABLET(30 MG) BY MOUTH AT BEDTIME   propranolol  (INDERAL ) 40 MG tablet TAKE ONE TABLET BY MOUTH TWICE DAILY   spironolactone  (ALDACTONE ) 25 MG tablet Take 1 tablet (25 mg total) by mouth at bedtime.   No facility-administered encounter medications on file as of 10/03/2024.    No results found for this or any previous visit (from the past 2160 hours).   Psychiatric Specialty Exam: Physical Exam  Review of Systems  Weight 161 lb (73 kg).There is no height or weight on file to calculate BMI.  General Appearance: NA  Eye Contact:  Good  Speech:  Clear and Coherent  Volume:  Normal  Mood:  Euthymic  Affect:  NA  Thought Process:  Goal Directed  Orientation:  Full (Time, Place, and Person)  Thought Content:  Logical  Suicidal Thoughts:  No  Homicidal Thoughts:  No  Memory:  Immediate;   Good Recent;   Good Remote;   Good  Judgement:  Good  Insight:  Present  Psychomotor Activity:  Normal  Concentration:  Concentration: Fair and Attention Span: Fair  Recall:  Good  Fund of Knowledge:  Good  Language:  Good  Akathisia:  No  Handed:  Right  AIMS (if indicated):     Assets:  Communication Skills Desire for Improvement Housing Social Support Transportation  ADL's:  Intact  Cognition:  WNL  Sleep:  ok       06/09/2024    1:44 PM 04/27/2024   11:05 AM 04/21/2024    4:02 PM 04/07/2024    3:18 PM 02/18/2024    2:16 PM  Depression screen PHQ 2/9  Decreased Interest 3 1 1 1 1   Down, Depressed, Hopeless 3 1 1 1 1   PHQ - 2 Score 6 2 2 2 2   Altered sleeping 3  1 1 1   Tired, decreased energy 3 3 1 2 3   Change in appetite 3 3 0 1 3  Feeling bad or failure about yourself  1 1 0 0 0  Trouble concentrating 0 0 0 0 0  Moving slowly or fidgety/restless 1 1 1  0 1  Suicidal thoughts 0 0 0 0 0  PHQ-9 Score 17   5  6  10    Difficult doing work/chores Somewhat difficult Somewhat difficult  Somewhat  difficult Very difficult     Data saved with a previous flowsheet row definition    Assessment/Plan: Major depressive disorder, recurrent episode, mild - Plan: buPROPion  (WELLBUTRIN  XL) 300 MG 24 hr tablet, mirtazapine  (REMERON ) 30 MG tablet  Anxiety - Plan: buPROPion  (WELLBUTRIN  XL) 300 MG 24 hr tablet, gabapentin  (NEURONTIN ) 300 MG capsule  Patient is 73 year old female with history of diabetes, hyperlipidemia, chronic pain, hypertension, peripheral artery disease, migraine, major depressive disorder and anxiety.  Encouraged to contact her primary care for blood work.  Patient does not want to change the medication since she feel current medicine is keeping her depression and anxiety stable.  Will continue gabapentin  300 mg at bedtime, Wellbutrin  XL 300 mg daily and mirtazapine  30 mg at bedtime.  Recommend to call back if she is any question or any concern.  Follow-up in 3 months.  Follow Up Instructions:     I discussed the assessment and treatment plan with the patient. The patient was provided an opportunity to ask questions and all were answered. The patient agreed with the plan and demonstrated an understanding of the instructions.   The patient was advised to call back or seek an in-person evaluation if the symptoms worsen or if the condition fails to improve as anticipated.    Collaboration of Care: Other provider involved in patient's care AEB notes are available in epic to review  Patient/Guardian was advised Release of Information must be obtained prior to any record release in order to collaborate their care with an outside provider. Patient/Guardian was advised if they have not already done so to contact the registration department to sign all necessary forms in order for us  to release information regarding their care.   Consent: Patient/Guardian gives verbal consent for treatment and assignment of benefits for services provided during this visit. Patient/Guardian expressed  understanding and agreed to proceed.     Total  encounter time 17 minutes which includes face-to-face time, chart reviewed, care coordination, order entry and documentation during this encounter.   Note: This document was prepared by [employer] voice dictation technology and any errors that results from this process are unintentional.    Leni ONEIDA Client, MD 10/03/2024

## 2024-10-04 ENCOUNTER — Encounter: Payer: Self-pay | Admitting: *Deleted

## 2024-10-04 ENCOUNTER — Telehealth: Payer: Self-pay | Admitting: *Deleted

## 2024-10-04 ENCOUNTER — Telehealth: Admitting: *Deleted

## 2024-10-04 NOTE — Patient Instructions (Signed)
 [name] - I am sorry I was unable to reach you today for our scheduled appointment. I work with Romelle Booty, MD and am calling to support your healthcare needs. Please contact me at [phone] at your earliest convenience. I look forward to speaking with you soon.   Thank you,   Olam Ku, RN, BSN [name]  [facility], [facility] RN Care Manager Direct Dial: [phone]  Fax: [phone]

## 2024-10-11 ENCOUNTER — Telehealth: Payer: Self-pay | Admitting: *Deleted

## 2024-10-11 ENCOUNTER — Encounter: Payer: Self-pay | Admitting: *Deleted

## 2024-10-11 NOTE — Patient Instructions (Signed)
 Sedonia GORMAN Slicker - I am sorry I was unable to reach you today for our scheduled appointment. I work with Romelle Booty, MD and am calling to support your healthcare needs. Please contact me at [phone] at your earliest convenience. I look forward to speaking with you soon.   Thank you,   Olam Ku, RN, BSN [name]  [facility], [facility] RN Care Manager Direct Dial: [phone]  Fax: [phone]

## 2024-10-17 ENCOUNTER — Other Ambulatory Visit: Payer: Self-pay | Admitting: Internal Medicine

## 2024-10-18 ENCOUNTER — Other Ambulatory Visit: Payer: Self-pay | Admitting: *Deleted

## 2024-10-18 NOTE — Patient Outreach (Signed)
 Complex Care Management   Visit Note  10/18/2024  Name:  [name] MRN: [id number] DOB: [birth year]  Situation: Referral received for Complex Care Management related to COPD and DM I obtained verbal consent from Patient.  Visit completed with Patient  on the phone  Background:   Past Medical History:  Diagnosis Date   Adenomatous colon polyp    Allergy    Allergy to environmental factors 02/09/2020   Anxiety    Asthma    Back pain with radiation    radiopathy    Bilateral carpal tunnel syndrome 10/27/2010   Qualifier: Diagnosis of  By: Antonetta MD, [name]     Cataract    FORMING    Cellulitis of left upper extremity 02/09/2020   COPD (chronic obstructive pulmonary disease) (HCC)    Corneal abrasion, left 04/28/2013   Denture sore mouth 08/29/2021   Patient reports that she cannot wear her top dentures due to soreness in her mouth. She reports that this has been going on since her upper teeth were extracted. She requests referral to oral surgeon.   Depression    Diabetes mellitus without complication (HCC)    ON METFORMIN     Dizziness 09/07/2018   GERD (gastroesophageal reflux disease)    Glaucoma    History of tobacco abuse 08/18/2008   HSV-2 (herpes simplex virus 2) infection 02/29/2016   Dx 2014, + serology   Hx of migraines    Hyperlipidemia    Hyperlipidemia associated with type 2 diabetes mellitus (HCC) 05/17/2012   Hypertension    on meds   Itching in the vaginal area 05/17/2015   Nicotine  addiction    PANIC ATTACK 11/05/2007   PELVIC  PAIN 08/13/2010   Plantar wart of right foot 09/14/2019   Skin lesions 12/08/2013   Has undergone extensive however incomplete autoimmune workup that has been unrevealing at this time  Concern for sarcoidosis versus SLE versus other connective tissue disorder including drug related SLE like syndrome     Snoring 08/29/2021   Type 2 diabetes mellitus with hyperglycemia (HCC) 11/22/2021   Urinary incontinence    Well  controlled type 2 diabetes mellitus (HCC) 02/24/2018    Assessment: Pt was in need of an appointment to reschedule with her PCP. RNCM attempted to contact clinic however to long on hold. RNCM requested pt to call and make the appointment directly due to the time spend holding (pt receptive and reach out and make an appointment). Much discussion today related to needed resources for eye provider for diabetic exam. Educated on how to search [email or web address] and provider line for [facility] at [phone] to find a provider of choice in her areas for needed services. RNCM also encouraged pt to use the [facility] number on the back of her card for providers in network in her residential area (pt receptive to all). Patient Reported Symptoms:  Cognitive Cognitive Status: Able to follow simple commands, Alert and oriented to person, place, and time, Normal speech and language skills   Health Maintenance Behaviors: Annual physical exam Healing Pattern: Average Health Facilitated by: Healthy diet  Neurological Neurological Review of Symptoms: No symptoms reported Neurological Management Strategies: Coping strategies, Routine screening  HEENT HEENT Symptoms Reported: Other: (C/O runny nose with clear drainage. Denies any fevers or chills.) HEENT Management Strategies: Coping strategies, Routine screening HEENT Comment: Seasonal allergies    Cardiovascular Cardiovascular Symptoms Reported: No symptoms reported Does patient have uncontrolled Hypertension?: No Cardiovascular Management Strategies: Coping strategies, Routine screening Cardiovascular Self-Management Outcome: 4 (good)  Respiratory Respiratory Symptoms Reported: No symptoms reported Other Respiratory Symptoms: Denies any flare up and pt remains in the GREEN zone. Pt state she continues to manage this conditin with no acute issues. Respiratory Management Strategies: Adequate rest, Coping strategies, Medication therapy Respiratory Self-Management Outcome: 4  (good)  Endocrine Endocrine Symptoms Reported: No symptoms reported Is patient diabetic?: Yes Is patient checking blood sugars at home?: No (Pt will have [facility] RN visit and educate on how to use her home device for glucose readings starting Monday. Pt will attempt daily CBG moving forward once she is educated on how to utilize her device.) Endocrine Self-Management Outcome: 4 (good)  Gastrointestinal Gastrointestinal Symptoms Reported: No symptoms reported Gastrointestinal Management Strategies: Coping strategies Gastrointestinal Self-Management Outcome: 4 (good)    Genitourinary Genitourinary Symptoms Reported: No symptoms reported Genitourinary Management Strategies: Coping strategies Genitourinary Self-Management Outcome: 4 (good)  Integumentary Integumentary Symptoms Reported: Rash Additional Integumentary Details: Pt continue to have eczema off and on as pt has prescribed medication she uses when needed. Skin Management Strategies: Coping strategies Skin Self-Management Outcome: 4 (good)  Musculoskeletal Musculoskelatal Symptoms Reviewed: No symptoms reported Musculoskeletal Management Strategies: Coping strategies, Adequate rest Musculoskeletal Self-Management Outcome: 4 (good)      Psychosocial Psychosocial Symptoms Reported: No symptoms reported Additional Psychological Details: Continue to have telehealth call with her psychiatrist 2 with last call 2 weeks ago. Behavioral Management Strategies: Coping strategies, Counseling Behavioral Health Self-Management Outcome: 4 (good) Major Change/Loss/Stressor/Fears (CP): Denies Quality of Family Relationships: helpful, involved, supportive Do you feel physically threatened by others?: No    There were no vitals filed for this visit. Pain Scale: 0-10 Pain Score: 0-No pain  Medications Reviewed Today     Reviewed by Alvia Olam BIRCH, RN (Registered Nurse) on 10/18/24 at 1448  Med List Status: <None>   Medication Order Taking? Sig  Documenting Provider Last Dose Status Informant  acetaminophen  (TYLENOL ) 500 MG tablet [id number] Yes Take 1,000 mg by mouth every 8 (eight) hours as needed for moderate pain. [provider]  Active Self  Alcohol Swabs (GLOBAL ALCOHOL PREP EASE) 70 % PADS [id number] Yes SWAB TO cleanse AREA EVERY DAY prior TO blood glucose check Shamleffer, Ibtehal Jaralla, MD  Active   alendronate  (FOSAMAX ) 70 MG tablet [id number] Yes take 1 TABLET BY MOUTH EVERY 7 DAYS IN THE MORNING with a full GLASS of water ON an empty stomach. DO not take anything else OR lie down FOR 30 minutes. Romelle Booty, MD  Active   amLODipine  (NORVASC ) 10 MG tablet [id number] Yes Take 1 tablet (10 mg total) by mouth at bedtime. Romelle Booty, MD  Active   atorvastatin  (LIPITOR) 80 MG tablet [id number] Yes TAKE 1 TABLET(80 MG) BY MOUTH DAILY Hope Merle, MD  Active   Blood Glucose Monitoring Suppl (ACCU-CHEK GUIDE ME) w/Device KIT [id number] Yes 1 Device by Does not apply route daily in the afternoon. Shamleffer, Donell Cardinal, MD  Active   Blood Pressure Monitoring (BLOOD PRESSURE KIT) DEVI [id number] Yes Please measure BP at same time each day Hope Merle, MD  Active Self  buPROPion  (WELLBUTRIN  XL) 300 MG 24 hr tablet [id number] Yes Take 1 tablet (300 mg total) by mouth every morning. Arfeen, Syed T, MD  Active   clobetasol  ointment (TEMOVATE ) 0.05 % [id number] Yes APPLY TOPICALLY TO THE AFFECTED AREA TWICE DAILY Hope Merle, MD  Active   cloNIDine  (CATAPRES ) 0.2 MG tablet [id number] Yes Take 1 tablet (0.2 mg total) by mouth at bedtime. Romelle Booty, MD  Active   clotrimazole  (  LOTRIMIN ) 1 % cream [id number] Yes Apply 1 application. topically 2 (two) times daily. Hope Merle, MD  Active   Easy Comfort Lancets MISC [id number] Yes Use with glucose device to check blood glucose once daily Dameron, Marisa, DO  Active   erythromycin  ophthalmic ointment [id number]  Place a 1/2 inch ribbon of ointment into the lower eyelid.  Patient not  taking: Reported on 07/22/2024   Kingsley, Victoria K, DO  Active   ezetimibe  (ZETIA ) 10 MG tablet [id number] Yes Take 1 tablet (10 mg total) by mouth daily. Romelle Booty, MD  Active   famotidine  (PEPCID ) 20 MG tablet [id number] Yes Take 1 Tablet by mouth once daily Dameron, Marisa, DO  Active   fluticasone  (FLONASE ) 50 MCG/ACT nasal spray [id number]  Place into the nose.  Patient not taking: Reported on 10/18/2024   [provider]  Active   gabapentin  (NEURONTIN ) 300 MG capsule [id number] Yes Take 1 capsule (300 mg total) by mouth at bedtime. Arfeen, Syed T, MD  Active   glipiZIDE  (GLUCOTROL ) 5 MG tablet [id number] Yes Take 1 tablet (5 mg total) by mouth daily before breakfast. Romelle Booty, MD  Active   glucose blood test strip [id number] Yes Use to check blood glucose once daily in the evening Dameron, Marisa, DO  Active   hydrochlorothiazide  (HYDRODIURIL ) 12.5 MG tablet [id number] Yes TAKE ONE TABLET BY MOUTH EVERY DAY Dameron, Marisa, DO  Active   latanoprost (XALATAN) 0.005 % ophthalmic solution [id number] Yes Place 1 drop into both eyes at bedtime. [provider]  Active Self  linaclotide  (LINZESS ) 145 MCG CAPS capsule [id number] Yes Take 1 capsule (145 mcg total) by mouth daily before breakfast. Romelle Booty, MD  Active   loratadine  (CLARITIN  REDITABS) 10 MG dissolvable tablet [id number] Yes Take 1 tablet (10 mg total) by mouth daily. As needed for allergy symptoms Hensel, Elsie LABOR, MD  Active Self  losartan  (COZAAR ) 50 MG tablet [id number] Yes Take 1 tablet (50 mg total) by mouth daily. Romelle Booty, MD  Active   metFORMIN  (GLUCOPHAGE ) 1000 MG tablet [id number] Yes TAKE 1 Tablet BY MOUTH ONCE DAILY WITH BREAKFAST Shamleffer, Ibtehal Jaralla, MD  Active   mirtazapine  (REMERON ) 30 MG tablet [id number] Yes TAKE 1 TABLET(30 MG) BY MOUTH AT BEDTIME Arfeen, Leni DASEN, MD  Active   propranolol  (INDERAL ) 40 MG tablet [id number] Yes TAKE ONE TABLET BY MOUTH TWICE DAILY Dameron, Marisa, DO   Active   spironolactone  (ALDACTONE ) 25 MG tablet [id number] Yes Take 1 tablet (25 mg total) by mouth at bedtime. Romelle Booty, MD  Active   Med List Note Earla Ozell BIRCH, DO 09/14/12 2000): Remeron  & Wellbutrin  Rx by Dr. Leni Client            Recommendation:   PCP Follow-up Continue Current Plan of Care  Follow Up Plan:   Telephone follow up appointment date/time:  11/18/2024 @ 3:00 pm   Olam Ku, RN, BSN [place]  [facility], [facility] RN Care Manager Direct Dial: [phone]  Fax: [phone]

## 2024-10-18 NOTE — Patient Instructions (Signed)
 Visit Information  Thank you for taking time to visit with me today. Please don't hesitate to contact me if I can be of assistance to you before our next scheduled appointment.  Your next care management appointment is by telephone on 11/18/2024 at 3:00 PM  [email or web address] and [facility] health provider line [phone]. Also call [employer] number on back of insurance card for other providers in the area under her network.  Please call the care guide team at [phone] if you need to cancel, schedule, or reschedule an appointment.   Please call the Suicide and Crisis Lifeline: 988 call the [place]  National Suicide Prevention Lifeline: 506-591-8143 or TTY: 854-723-4628 TTY 828-841-1629) to talk to a trained counselor call 1-800-273-TALK (toll free, 24 hour hotline) if you are experiencing a Mental Health or Behavioral Health Crisis or need someone to talk to.  Olam Ku, RN, BSN [place]  [facility], [facility] RN Care Manager Direct Dial: [phone]  Fax: [phone]

## 2024-11-08 ENCOUNTER — Emergency Department (HOSPITAL_COMMUNITY)
Admission: EM | Admit: 2024-11-08 | Discharge: 2024-11-08 | Disposition: A | Discharge status: Home / Self Care | Attending: Emergency Medicine | Admitting: Emergency Medicine

## 2024-11-08 ENCOUNTER — Other Ambulatory Visit: Payer: Self-pay

## 2024-11-08 DIAGNOSIS — Z91040 Latex allergy status: Secondary | ICD-10-CM | POA: Insufficient documentation

## 2024-11-08 DIAGNOSIS — H1031 Unspecified acute conjunctivitis, right eye: Secondary | ICD-10-CM | POA: Insufficient documentation

## 2024-11-08 DIAGNOSIS — H5789 Other specified disorders of eye and adnexa: Secondary | ICD-10-CM | POA: Diagnosis present

## 2024-11-08 MED ORDER — TETRACAINE HCL 0.5 % OP SOLN
1.0000 [drp] | Freq: Once | OPHTHALMIC | Status: DC
  Filled 2024-11-08: qty 4

## 2024-11-08 MED ORDER — ERYTHROMYCIN 5 MG/GM OP OINT: TOPICAL_OINTMENT | OPHTHALMIC | 0 refills | Status: AC

## 2024-11-08 MED ORDER — FLUORESCEIN SODIUM 1 MG OP STRP
1.0000 | ORAL_STRIP | Freq: Once | OPHTHALMIC | Status: AC
  Administered 2024-11-08: 1 via OPHTHALMIC
  Filled 2024-11-08: qty 1

## 2024-11-08 NOTE — ED Triage Notes (Signed)
 Pt. Stated, I got some of my eczema cream on my right eye 2 weeks ago and its still burning.

## 2024-11-08 NOTE — ED Provider Triage Note (Signed)
 Emergency Medicine Provider Triage Evaluation Note  [name] , a 74 y.o. female  was evaluated in triage.  Pt complains of right eye burning after possibly getting some eczema cream on it.  Has been going on 2 weeks  Review of Systems  Positive: Eye discomfort Negative: Visual disturbance  Physical Exam  BP (!) 155/84 (BP Location: Left Arm)   Pulse 66   Temp 97.8 F (36.6 C)   Resp 16   Ht 5' 1 (1.549 m)   Wt 66.7 kg   SpO2 100%   BMI 27.78 kg/m  Gen:   Awake, no distress   Resp:  Normal effort  MSK:   Moves extremities without difficulty  Other:    Medical Decision Making  Medically screening exam initiated at 9:23 AM.  Appropriate orders placed.  [name] was informed that the remainder of the evaluation will be completed by another provider, this initial triage assessment does not replace that evaluation, and the importance of remaining in the ED until their evaluation is complete.     Emily Ozell BROCKS, MD 11/08/24 [phone]

## 2024-11-08 NOTE — Discharge Instructions (Signed)
 You were seen in the ER for right eye discomfort and drainage. You have two small styes and also might have conjunctivitis.   Use warm compresses on your eye every hour while awake and I am also starting you on an antibiotic ointment.  I am referring you to the eye doctor for a recheck and for your regular retinal diabetic screening.  If you worsen, please be re-evaluated immediately.

## 2024-11-08 NOTE — ED Provider Notes (Signed)
 " [place] EMERGENCY DEPARTMENT AT [facility] Provider Note   CSN: [id number] Arrival date & time: 11/08/24  0900     Patient presents with: Eye Pain   [name] is a 74 y.o. female.  74 yo female presents with right eye itching, discomfort, and drainage for the past 2 weeks since she accidentally got some steroid cream in it.  She was prescribed a steroid cream for a rash on her right hand and she got a little bit in her eye and feels like that started her symptoms.  She denies any change in vision, headache, neurosymptoms, fevers, chills, any other concerns.  She states that her eye is itchy and she is waking up in the morning with crusty drainage.     Prior to Admission medications  Medication Sig Start Date End Date Taking? Authorizing Provider  erythromycin  ophthalmic ointment Place a 1/2 inch ribbon of ointment into the lower eyelid 3 times daily for one week 11/08/24  Yes Shye Doty Hima, MD  acetaminophen  (TYLENOL ) 500 MG tablet Take 1,000 mg by mouth every 8 (eight) hours as needed for moderate pain.    [provider]  Alcohol Swabs (GLOBAL ALCOHOL PREP EASE) 70 % PADS SWAB TO cleanse AREA EVERY DAY prior TO blood glucose check 10/17/24   Shamleffer, Donell Cardinal, MD  alendronate  (FOSAMAX ) 70 MG tablet take 1 TABLET BY MOUTH EVERY 7 DAYS IN THE MORNING with a full GLASS of water ON an empty stomach. DO not take anything else OR lie down FOR 30 minutes. 09/27/24   Romelle Booty, MD  amLODipine  (NORVASC ) 10 MG tablet Take 1 tablet (10 mg total) by mouth at bedtime. 09/27/24   Romelle Booty, MD  atorvastatin  (LIPITOR) 80 MG tablet TAKE 1 TABLET(80 MG) BY MOUTH DAILY 04/09/22   Hope Merle, MD  Blood Glucose Monitoring Suppl (ACCU-CHEK GUIDE ME) w/Device KIT 1 Device by Does not apply route daily in the afternoon. 09/03/22   Shamleffer, Ibtehal Jaralla, MD  Blood Pressure Monitoring (BLOOD PRESSURE KIT) DEVI Please measure BP at same time each day  03/26/21   Hope Merle, MD  buPROPion  (WELLBUTRIN  XL) 300 MG 24 hr tablet Take 1 tablet (300 mg total) by mouth every morning. 10/03/24 01/01/25  Arfeen, Syed T, MD  clobetasol  ointment (TEMOVATE ) 0.05 % APPLY TOPICALLY TO THE AFFECTED AREA TWICE DAILY 04/09/22   Hope Merle, MD  cloNIDine  (CATAPRES ) 0.2 MG tablet Take 1 tablet (0.2 mg total) by mouth at bedtime. 09/27/24   Romelle Booty, MD  clotrimazole  (LOTRIMIN ) 1 % cream Apply 1 application. topically 2 (two) times daily. 03/04/22   Hope Merle, MD  Easy Comfort Lancets MISC Use with glucose device to check blood glucose once daily 04/21/24   Dameron, Marisa, DO  ezetimibe  (ZETIA ) 10 MG tablet Take 1 tablet (10 mg total) by mouth daily. 06/29/24   Romelle Booty, MD  famotidine  (PEPCID ) 20 MG tablet Take 1 Tablet by mouth once daily 04/03/24   Dameron, Marisa, DO  fluticasone  (FLONASE ) 50 MCG/ACT nasal spray Place into the nose. Patient not taking: Reported on 10/18/2024 08/25/16   [provider]  gabapentin  (NEURONTIN ) 300 MG capsule Take 1 capsule (300 mg total) by mouth at bedtime. 10/03/24   Arfeen, Leni DASEN, MD  glipiZIDE  (GLUCOTROL ) 5 MG tablet Take 1 tablet (5 mg total) by mouth daily before breakfast. 06/29/24   Romelle Booty, MD  glucose blood test strip Use to check blood glucose once daily in the evening 04/21/24  Dameron, Marisa, DO  hydrochlorothiazide  (HYDRODIURIL ) 12.5 MG tablet TAKE ONE TABLET BY MOUTH EVERY DAY 03/31/24   Dameron, Marisa, DO  latanoprost (XALATAN) 0.005 % ophthalmic solution Place 1 drop into both eyes at bedtime. 06/27/20   [provider]  linaclotide  (LINZESS ) 145 MCG CAPS capsule Take 1 capsule (145 mcg total) by mouth daily before breakfast. 09/27/24   Romelle Booty, MD  loratadine  (CLARITIN  REDITABS) 10 MG dissolvable tablet Take 1 tablet (10 mg total) by mouth daily. As needed for allergy symptoms 02/01/21   Scarlet Elsie LABOR, MD  losartan  (COZAAR ) 50 MG tablet Take 1 tablet (50 mg total) by mouth daily.  09/27/24   Romelle Booty, MD  metFORMIN  (GLUCOPHAGE ) 1000 MG tablet TAKE 1 Tablet BY MOUTH ONCE DAILY WITH BREAKFAST 03/30/24   Shamleffer, Ibtehal Jaralla, MD  mirtazapine  (REMERON ) 30 MG tablet TAKE 1 TABLET(30 MG) BY MOUTH AT BEDTIME 10/03/24   Arfeen, Leni DASEN, MD  propranolol  (INDERAL ) 40 MG tablet TAKE ONE TABLET BY MOUTH TWICE DAILY 03/31/24   Dameron, Marisa, DO  spironolactone  (ALDACTONE ) 25 MG tablet Take 1 tablet (25 mg total) by mouth at bedtime. 06/29/24   Romelle Booty, MD    Allergies: Aspirin, Penicillins, and Latex    Review of Systems  Eyes:  Positive for pain.    Updated Vital Signs BP (!) 153/78 (BP Location: Left Arm)   Pulse 65   Temp 98 F (36.7 C) (Oral)   Resp 18   Ht 5' 1 (1.549 m)   Wt 66.7 kg   SpO2 99%   BMI 27.78 kg/m   Physical Exam  Constitutional: Patient appears well-developed and well-nourished. No distress.  HENT:  Head: Normocephalic and atraumatic.  Mouth/Throat: Oropharynx is clear and moist. No oropharyngeal exudate.  Eyes: Conjunctivae are normal. Pupils are equal, round, and reactive to light.  Fluorescein  stain of right eye is negative for corneal defect.  Note 2 small styes to right lower eyelid. Neck: Normal range of motion. Neck supple.  Musculoskeletal: Normal range of motion. No edema or tenderness.  Neurological: Patient is alert with normal muscle tone. Coordination normal.  Skin: Skin is warm and dry. No diaphoresis.  Psychiatric: Normal mood and affect. Normal behavior. Judgment and thought content normal.  Nursing note and vitals reviewed.    Medications Ordered in the ED  tetracaine  (PONTOCAINE) 0.5 % ophthalmic solution 1 drop (1 drop Right Eye Not Given 11/08/24 1208)  fluorescein  ophthalmic strip 1 strip (1 strip Right Eye Given 11/08/24 1209)                               Medical Decision Making Risk Prescription drug management. Risk Details: 74 year old female presents with right eye discomfort and drainage for the last  2 weeks since getting steroid cream in her eye.  Her exam today is normal other than 2 styes noted.  Fluorescein  staining negative.  No injection, pain, headache, other symptoms to suspect glaucoma or other critical diagnoses.  For now will start erythromycin  ointment and warm compresses.  Refer to ophthalmology as patient is due for diabetic retinal screening and has not been able to find a clinic taking new patients.  Advised to be reevaluated immediately for any worsening.    Final diagnoses:  Acute conjunctivitis of right eye, unspecified acute conjunctivitis type    ED Discharge Orders          Ordered    Ambulatory referral to Ophthalmology  11/08/24 1205    erythromycin  ophthalmic ointment        11/08/24 1205               Fredia Rosette Kirsch, MD 11/08/24 1228  "

## 2024-11-18 ENCOUNTER — Other Ambulatory Visit: Payer: Self-pay

## 2024-11-18 NOTE — Patient Instructions (Signed)
 Visit Information  Thank you for taking time to visit with me today. Please don't hesitate to contact me if I can be of assistance to you before our next scheduled appointment.  Your next care management appointment is by telephone on 12/08/24 at 1 pm  Please call the care guide team at [phone] if you need to cancel, schedule, or reschedule an appointment.   Please call the Suicide and Crisis Lifeline: 988 call the [place]  National Suicide Prevention Lifeline: 209-328-5765 or TTY: (818)613-7277 TTY 970-533-2906) to talk to a trained counselor call 1-800-273-TALK (toll free, 24 hour hotline) if you are experiencing a Mental Health or Behavioral Health Crisis or need someone to talk to.  Warren Quivers RN CM Population Health-Complex Care Management [facility] [phone]

## 2024-11-18 NOTE — Patient Outreach (Signed)
 Complex Care Management   Visit Note  11/18/2024  Name:  [name] MRN: [id number] DOB: 1951/08/11  Situation: Referral received for Complex Care Management related to COPD and Diabetes with Complications. I obtained verbal consent from Patient.  Visit completed with Patient  on the phone  Background:   Past Medical History:  Diagnosis Date   Adenomatous colon polyp    Allergy    Allergy to environmental factors 02/09/2020   Anxiety    Asthma    Back pain with radiation    radiopathy    Bilateral carpal tunnel syndrome 10/27/2010   Qualifier: Diagnosis of  By: Antonetta MD, [name]     Cataract    FORMING    Cellulitis of left upper extremity 02/09/2020   COPD (chronic obstructive pulmonary disease) (HCC)    Corneal abrasion, left 04/28/2013   Denture sore mouth 08/29/2021   Patient reports that she cannot wear her top dentures due to soreness in her mouth. She reports that this has been going on since her upper teeth were extracted. She requests referral to oral surgeon.   Depression    Diabetes mellitus without complication (HCC)    ON METFORMIN     Dizziness 09/07/2018   GERD (gastroesophageal reflux disease)    Glaucoma    History of tobacco abuse 08/18/2008   HSV-2 (herpes simplex virus 2) infection 02/29/2016   Dx 2014, + serology   Hx of migraines    Hyperlipidemia    Hyperlipidemia associated with type 2 diabetes mellitus (HCC) 05/17/2012   Hypertension    on meds   Itching in the vaginal area 05/17/2015   Nicotine  addiction    PANIC ATTACK 11/05/2007   PELVIC  PAIN 08/13/2010   Plantar wart of right foot 09/14/2019   Skin lesions 12/08/2013   Has undergone extensive however incomplete autoimmune workup that has been unrevealing at this time  Concern for sarcoidosis versus SLE versus other connective tissue disorder including drug related SLE like syndrome     Snoring 08/29/2021   Type 2 diabetes mellitus with hyperglycemia (HCC) 11/22/2021   Urinary  incontinence    Well controlled type 2 diabetes mellitus (HCC) 02/24/2018    Assessment: Patient Reported Symptoms:  Cognitive Cognitive Status: No symptoms reported, Normal speech and language skills Cognitive/Intellectual Conditions Management [RPT]: None reported or documented in medical history or problem list   Health Maintenance Behaviors: Annual physical exam Healing Pattern: Unsure Health Facilitated by: Healthy diet  Neurological Neurological Review of Symptoms: Dizziness, Headaches Neurological Management Strategies: Routine screening Neurological Self-Management Outcome: 4 (good) Neurological Comment: c/o headaches and dizziness- states provider is aware- has upcoming appt on Tuesday 11/22/24  HEENT HEENT Symptoms Reported: Tinnitus (states she has intermittent ringing in her left ear.) HEENT Management Strategies: Routine screening, Coping strategies HEENT Self-Management Outcome: 3 (uncertain) HEENT Comment: seasonal allergies    Cardiovascular Cardiovascular Symptoms Reported: No symptoms reported Does patient have uncontrolled Hypertension?: No Cardiovascular Management Strategies: Medication therapy, Routine screening Weight: 147 lb (66.7 kg) Cardiovascular Self-Management Outcome: 4 (good)  Respiratory Respiratory Symptoms Reported: No symptoms reported Other Respiratory Symptoms: Denies any symptoms Additional Respiratory Details: Upcoming appt with provider on 11/22/24 Respiratory Management Strategies: Routine screening Respiratory Self-Management Outcome: 3 (uncertain)  Endocrine Endocrine Symptoms Reported: No symptoms reported Is patient diabetic?: Yes Is patient checking blood sugars at home?: No List most recent blood sugar readings, include date and time of day: Patient states it has been a while since I have checked my BS Encouraged patient  to check BS daily and keep a log. States she will get her daughter to help her today. Endocrine Self-Management  Outcome: 4 (good)  Gastrointestinal Gastrointestinal Symptoms Reported: No symptoms reported Additional Gastrointestinal Details: Reports normal BM's every other day Gastrointestinal Management Strategies: Adequate rest Gastrointestinal Self-Management Outcome: 4 (good)    Genitourinary Genitourinary Symptoms Reported: No symptoms reported Genitourinary Management Strategies: Coping strategies Genitourinary Self-Management Outcome: 4 (good)  Integumentary Integumentary Symptoms Reported: Rash Additional Integumentary Details: Has eczema and uses prescribed creams. States it has improved. Flares up at times. Skin Management Strategies: Routine screening, Medication therapy, Coping strategies Skin Self-Management Outcome: 4 (good)  Musculoskeletal Musculoskelatal Symptoms Reviewed: No symptoms reported Musculoskeletal Management Strategies: Adequate rest, Routine screening Musculoskeletal Self-Management Outcome: 4 (good) Falls in the past year?: Yes Number of falls in past year: 1 or less Was there an injury with Fall?: No Fall Risk Category Calculator: 1 Patient Fall Risk Level: Low Fall Risk Patient at Risk for Falls Due to: Impaired balance/gait Fall risk Follow up: Falls prevention discussed  Psychosocial Psychosocial Symptoms Reported: No symptoms reported Additional Psychological Details: Talks with psychiatrist every 3 months. Reports no symptoms of anxiety or depression at time of call. Behavioral Management Strategies: Counseling, Medication therapy Behavioral Health Self-Management Outcome: 4 (good) Major Change/Loss/Stressor/Fears (CP): Medical condition, self Quality of Family Relationships: supportive, involved, helpful Do you feel physically threatened by others?: No    11/18/2024    PHQ2-9 Depression Screening   Little interest or pleasure in doing things Several days  Feeling down, depressed, or hopeless Not at all  PHQ-2 - Total Score 1  Trouble falling or staying  asleep, or sleeping too much Several days  Feeling tired or having little energy Several days  Poor appetite or overeating  Several days  Feeling bad about yourself - or that you are a failure or have let yourself or your family down Not at all  Trouble concentrating on things, such as reading the newspaper or watching television Not at all  Moving or speaking so slowly that other people could have noticed.  Or the opposite - being so fidgety or restless that you have been moving around a lot more than usual Not at all  Thoughts that you would be better off dead, or hurting yourself in some way Not at all  PHQ2-9 Total Score 4  If you checked off any problems, how difficult have these problems made it for you to do your work, take care of things at home, or get along with other people Somewhat difficult  Depression Interventions/Treatment Medication, Counseling    Today's Vitals   11/18/24 1526  Weight: 147 lb (66.7 kg)   Pain Scale: 0-10 Pain Score: 0-No pain  Medications Reviewed Today     Reviewed by Leodis Warren DEL, RN (Registered Nurse) on 11/18/24 at 1520  Med List Status: <None>   Medication Order Taking? Sig Documenting Provider Last Dose Status Informant  acetaminophen  (TYLENOL ) 500 MG tablet [id number] Yes Take 1,000 mg by mouth every 8 (eight) hours as needed for moderate pain. [provider]  Active Self  Alcohol Swabs (GLOBAL ALCOHOL PREP EASE) 70 % PADS [id number] Yes SWAB TO cleanse AREA EVERY DAY prior TO blood glucose check Shamleffer, Ibtehal Jaralla, MD  Active   alendronate  (FOSAMAX ) 70 MG tablet [id number] Yes take 1 TABLET BY MOUTH EVERY 7 DAYS IN THE MORNING with a full GLASS of water ON an empty stomach. DO not take anything else OR lie down FOR 30  minutes. Romelle Booty, MD  Active   amLODipine  (NORVASC ) 10 MG tablet [id number] Yes Take 1 tablet (10 mg total) by mouth at bedtime. Romelle Booty, MD  Active   atorvastatin  (LIPITOR) 80 MG tablet [id number] Yes  TAKE 1 TABLET(80 MG) BY MOUTH DAILY Hope Merle, MD  Active   Blood Glucose Monitoring Suppl (ACCU-CHEK GUIDE ME) w/Device KIT [id number] Yes 1 Device by Does not apply route daily in the afternoon. Shamleffer, Donell Cardinal, MD  Active   Blood Pressure Monitoring (BLOOD PRESSURE KIT) DEVI [id number] Yes Please measure BP at same time each day Hope Merle, MD  Active Self  buPROPion  (WELLBUTRIN  XL) 300 MG 24 hr tablet [id number] Yes Take 1 tablet (300 mg total) by mouth every morning. Arfeen, Syed T, MD  Active   clobetasol  ointment (TEMOVATE ) 0.05 % [id number] Yes APPLY TOPICALLY TO THE AFFECTED AREA TWICE DAILY Hope Merle, MD  Active   cloNIDine  (CATAPRES ) 0.2 MG tablet [id number] Yes Take 1 tablet (0.2 mg total) by mouth at bedtime. Romelle Booty, MD  Active   clotrimazole  (LOTRIMIN ) 1 % cream [id number] Yes Apply 1 application. topically 2 (two) times daily. Hope Merle, MD  Active   Easy Comfort Lancets MISC [id number] Yes Use with glucose device to check blood glucose once daily Dameron, Marisa, DO  Active   erythromycin  ophthalmic ointment [id number] Yes Place a 1/2 inch ribbon of ointment into the lower eyelid 3 times daily for one week Jonnal, Aparna Hima, MD  Active   ezetimibe  (ZETIA ) 10 MG tablet [id number] Yes Take 1 tablet (10 mg total) by mouth daily. Romelle Booty, MD  Active   famotidine  (PEPCID ) 20 MG tablet [id number] Yes Take 1 Tablet by mouth once daily Dameron, Marisa, DO  Active   fluticasone  (FLONASE ) 50 MCG/ACT nasal spray [id number]  Place into the nose.  Patient not taking: Reported on 11/18/2024   [provider]  Active   gabapentin  (NEURONTIN ) 300 MG capsule [id number] Yes Take 1 capsule (300 mg total) by mouth at bedtime. Curry Leni DASEN, MD  Active   glipiZIDE  (GLUCOTROL ) 5 MG tablet [id number] Yes Take 1 tablet (5 mg total) by mouth daily before breakfast. Romelle Booty, MD  Active   glucose blood test strip [id number] Yes Use to check blood glucose once daily in the  evening Dameron, Marisa, DO  Active   hydrochlorothiazide  (HYDRODIURIL ) 12.5 MG tablet [id number] Yes TAKE ONE TABLET BY MOUTH EVERY DAY Dameron, Marisa, DO  Active   latanoprost (XALATAN) 0.005 % ophthalmic solution [id number] Yes Place 1 drop into both eyes at bedtime. [provider]  Active Self  linaclotide  (LINZESS ) 145 MCG CAPS capsule [id number] Yes Take 1 capsule (145 mcg total) by mouth daily before breakfast. Romelle Booty, MD  Active   loratadine  (CLARITIN  REDITABS) 10 MG dissolvable tablet [id number] Yes Take 1 tablet (10 mg total) by mouth daily. As needed for allergy symptoms Hensel, Elsie LABOR, MD  Active Self  losartan  (COZAAR ) 50 MG tablet [id number] Yes Take 1 tablet (50 mg total) by mouth daily. Romelle Booty, MD  Active   metFORMIN  (GLUCOPHAGE ) 1000 MG tablet [id number] Yes TAKE 1 Tablet BY MOUTH ONCE DAILY WITH BREAKFAST Shamleffer, Ibtehal Jaralla, MD  Active   mirtazapine  (REMERON ) 30 MG tablet [id number] Yes TAKE 1 TABLET(30 MG) BY MOUTH AT BEDTIME Arfeen, Leni DASEN, MD  Active   propranolol  (INDERAL ) 40 MG tablet [id number] Yes TAKE ONE TABLET BY MOUTH TWICE DAILY Dameron, Marisa, DO  Active   spironolactone  (ALDACTONE ) 25 MG tablet  [id number] Yes Take 1 tablet (25 mg total) by mouth at bedtime. Romelle Booty, MD  Active   Med List Note Earla Ozell BIRCH, DO 09/14/12 2000): Remeron  & Wellbutrin  Rx by Dr. Leni Client            Recommendation:   Continue Current Plan of Care  Follow Up Plan:   Telephone follow up appointment date/time:  12/08/24 at 1 pm  Warren Quivers RN CM Population Health-Complex Care Management [facility] [phone]

## 2024-11-22 ENCOUNTER — Encounter: Payer: Self-pay | Admitting: Family Medicine

## 2024-11-22 ENCOUNTER — Ambulatory Visit (INDEPENDENT_AMBULATORY_CARE_PROVIDER_SITE_OTHER): Payer: Self-pay | Admitting: Family Medicine

## 2024-11-22 VITALS — BP 157/72 | HR 59 | Ht 61.0 in | Wt 156.0 lb

## 2024-11-22 DIAGNOSIS — Z1231 Encounter for screening mammogram for malignant neoplasm of breast: Secondary | ICD-10-CM

## 2024-11-22 DIAGNOSIS — Z1159 Encounter for screening for other viral diseases: Secondary | ICD-10-CM | POA: Diagnosis not present

## 2024-11-22 DIAGNOSIS — N1831 Chronic kidney disease, stage 3a: Secondary | ICD-10-CM

## 2024-11-22 DIAGNOSIS — Z114 Encounter for screening for human immunodeficiency virus [HIV]: Secondary | ICD-10-CM

## 2024-11-22 DIAGNOSIS — E1159 Type 2 diabetes mellitus with other circulatory complications: Secondary | ICD-10-CM | POA: Diagnosis not present

## 2024-11-22 DIAGNOSIS — E1169 Type 2 diabetes mellitus with other specified complication: Secondary | ICD-10-CM

## 2024-11-22 DIAGNOSIS — Z135 Encounter for screening for eye and ear disorders: Secondary | ICD-10-CM

## 2024-11-22 DIAGNOSIS — I152 Hypertension secondary to endocrine disorders: Secondary | ICD-10-CM

## 2024-11-22 DIAGNOSIS — M8580 Other specified disorders of bone density and structure, unspecified site: Secondary | ICD-10-CM | POA: Diagnosis not present

## 2024-11-22 DIAGNOSIS — Z1211 Encounter for screening for malignant neoplasm of colon: Secondary | ICD-10-CM

## 2024-11-22 DIAGNOSIS — Z23 Encounter for immunization: Secondary | ICD-10-CM | POA: Diagnosis not present

## 2024-11-22 DIAGNOSIS — E1122 Type 2 diabetes mellitus with diabetic chronic kidney disease: Secondary | ICD-10-CM

## 2024-11-22 LAB — POCT GLYCOSYLATED HEMOGLOBIN (HGB A1C): HbA1c, POC (controlled diabetic range): 7.2 % — AB (ref 0.0–7.0)

## 2024-11-22 MED ORDER — SHINGRIX 50 MCG/0.5ML IM SUSR: INTRAMUSCULAR | 1 refills | Status: AC

## 2024-11-22 NOTE — Assessment & Plan Note (Addendum)
 Uncontrolled On multiple medications Recommend BP log and f/u with Dr. Koval within next month

## 2024-11-22 NOTE — Progress Notes (Signed)
 "   SUBJECTIVE:   Chief compliant/HPI: annual examination  [name] is a 74 y.o. who presents today for an annual exam.   Hx includes T2DM, HLD, aortic atherosclerosis, HTN, CKDIII  T2DM -Current medication regimen: metformin  1000mg  daily, glipizide  5mg  daily. She is on lipitor 80mg . She is on losartan  50mg . She is on gabapentin  300mg  at bedtime. She is on zetia  10mg  daily. -She does occasionally feel like her sugar is low. She is willing to stop glipizide  -Home CBGs: not checking -Denies polyuria, polydipsia, abdominal pain, chest pain, shortness of breath -Foot exam: today -Eye exam: due, discussed, placed referral  Lab Results  Component Value Date   HGBA1C 7.2 (A) 11/22/2024   HGBA1C 7.5 (A) 01/29/2024   HGBA1C 8.9 (A) 07/03/2023     HTN Currently on losartan  50g daily, amlodipine  10mg  at bedtime, clonidine  0.2mg  at bedtime, spironolactone  25mg  at bedtime She has been taking meds as prescribed      OBJECTIVE:   BP (!) 157/72   Pulse (!) 59   Ht 5' 1 (1.549 m)   Wt 156 lb (70.8 kg)   SpO2 100%   BMI 29.48 kg/m    General: NAD, pleasant, able to participate in exam Cardiac: RRR, no murmurs auscultated Respiratory: CTAB, normal WOB Abdomen: soft, non-tender, non-distended, normoactive bowel sounds Extremities: warm and well perfused, no edema or cyanosis Neuro: alert, no obvious focal deficits, speech normal Psych: Normal affect and mood  Feet: No notable skin breakdown or wounds.  Sensation intact to sharp and dull sensation in multiple locations on the toes, dorsum of foot, sole, and above the ankle.  Proprioception intact.  Able to bear weight. DP 2+  Flowsheet Row Patient Outreach Telephone from 11/18/2024 in [place] [facility] DEPART[facility]  PHQ-9 Total Score 4     ASSESSMENT/PLAN:   Assessment & Plan Type 2 diabetes mellitus with stage 3a chronic kidney disease, without long-term current use of insulin (HCC) A1c 7.2 at goal DC  glipizide  given risk of hypoglycemia Continue metformin  F/u 19mo consider increasing metformin  if worsening Screen for colon cancer GI referral Encounter for screening mammogram for malignant neoplasm of breast Mammo ordered Hypertension associated with diabetes (HCC) Uncontrolled On multiple medications Recommend BP log and f/u with Dr. Koval within next month Hyperlipidemia associated with type 2 diabetes mellitus (HCC) Lipid panel today Continue lipitor, zetia  Osteopenia, unspecified location DEXA ordered On fosamax  Screening for diabetic retinopathy Ophtho referral  Need for shingles vaccine Provided rx Screening for HIV (human immunodeficiency virus) ordered Need for hepatitis C screening test ordered   Annual Examination  See AVS for age appropriate recommendations  PHQ score 4, reviewed and discussed.  Asked about intimate partner violence and resources given as appropriate   Considered the following items based upon USPSTF recommendations: Diabetes screening: ordered HIV testing:ordered Hepatitis C: ordered Syphilis if at high risk: discussed and declined GC/CT declined Lipid panel (nonfasting or fasting) discussed based upon AHA recommendations and ordered.  Consider repeat every 4-6 years.  Reviewed risk factors for latent tuberculosis and not indicated   Cancer Screening Discussion  Cervical cancer screening: not indicated, reports hx hysterectomy and normal pap smears Breast cancer screening: hx double mastectomy Lung cancer screening:not indicated as does not meet criteria, 1 pack per week x 40 years.   Colorectal cancer screening: discussed, colonoscopy ordered.    Follow up in <219mo with Dr. Koval 19mo with me for A1c check MyChart Activation: Already signed up  Payton Coward, MD [facility]  "

## 2024-11-22 NOTE — Assessment & Plan Note (Addendum)
 A1c 7.2 at goal DC glipizide  given risk of hypoglycemia Continue metformin  F/u 48mo consider increasing metformin  if worsening

## 2024-11-22 NOTE — Assessment & Plan Note (Addendum)
 Lipid panel today Continue lipitor, zetia 

## 2024-11-22 NOTE — Patient Instructions (Addendum)
 Please schedule your mammogram per the instructions on the sheet provided to you today. , You should receive a call soon to schedule a colonoscopy., Please take your printed prescription to your pharmacy to obtain your shingles vaccine.  If any of your results from today are abnormal and/or require changes to your medical care, I will give you a call. Otherwise, I will send you a letter in the mail or a message on MyChart.   Please get your bone density scan done at [facility], they should call you soon  You should get a call to schedule with an eye doctor soon for diabetic eye exam  Please see Dr Koval within the next month to discuss your blood pressure

## 2024-11-23 LAB — LIPID PANEL
Chol/HDL Ratio: 7.1 ratio — ABNORMAL HIGH (ref 0.0–4.4)
Cholesterol, Total: 283 mg/dL — ABNORMAL HIGH (ref 100–199)
HDL: 40 mg/dL
LDL Chol Calc (NIH): 197 mg/dL — ABNORMAL HIGH (ref 0–99)
Triglycerides: 236 mg/dL — ABNORMAL HIGH (ref 0–149)
VLDL Cholesterol Cal: 46 mg/dL — ABNORMAL HIGH (ref 5–40)

## 2024-11-23 LAB — BASIC METABOLIC PANEL WITH GFR
BUN/Creatinine Ratio: 12 (ref 12–28)
BUN: 18 mg/dL (ref 8–27)
CO2: 18 mmol/L — ABNORMAL LOW (ref 20–29)
Calcium: 9.8 mg/dL (ref 8.7–10.3)
Chloride: 105 mmol/L (ref 96–106)
Creatinine, Ser: 1.55 mg/dL — ABNORMAL HIGH (ref 0.57–1.00)
Glucose: 160 mg/dL — ABNORMAL HIGH (ref 70–99)
Potassium: 4.7 mmol/L (ref 3.5–5.2)
Sodium: 141 mmol/L (ref 134–144)
eGFR: 35 mL/min/{1.73_m2} — ABNORMAL LOW

## 2024-11-23 LAB — MICROALBUMIN / CREATININE URINE RATIO
Creatinine, Urine: 100.3 mg/dL
Microalb/Creat Ratio: 49 mg/g{creat} — ABNORMAL HIGH (ref 0–29)
Microalbumin, Urine: 49.5 ug/mL

## 2024-11-23 LAB — HIV ANTIBODY (ROUTINE TESTING W REFLEX): HIV Screen 4th Generation wRfx: NONREACTIVE

## 2024-11-23 LAB — HCV INTERPRETATION

## 2024-11-23 LAB — HCV AB W REFLEX TO QUANT PCR: HCV Ab: NONREACTIVE

## 2024-11-30 ENCOUNTER — Ambulatory Visit: Payer: Self-pay | Admitting: Family Medicine

## 2024-11-30 DIAGNOSIS — N1832 Chronic kidney disease, stage 3b: Secondary | ICD-10-CM

## 2024-12-08 ENCOUNTER — Telehealth

## 2024-12-08 ENCOUNTER — Ambulatory Visit: Admitting: Pharmacist

## 2025-01-16 ENCOUNTER — Telehealth (HOSPITAL_COMMUNITY): Admitting: Psychiatry

## 2025-06-12 ENCOUNTER — Encounter
